# Patient Record
Sex: Female | Born: 1935
Health system: Southern US, Community
[De-identification: ages and names within clinical notes are randomized; demographics above are authoritative.]

## PROBLEM LIST (undated history)

## (undated) DIAGNOSIS — Z79899 Other long term (current) drug therapy: Secondary | ICD-10-CM

## (undated) DIAGNOSIS — E785 Hyperlipidemia, unspecified: Secondary | ICD-10-CM

## (undated) DIAGNOSIS — I779 Disorder of arteries and arterioles, unspecified: Secondary | ICD-10-CM

## (undated) DIAGNOSIS — J189 Pneumonia, unspecified organism: Secondary | ICD-10-CM

## (undated) DIAGNOSIS — I10 Essential (primary) hypertension: Secondary | ICD-10-CM

## (undated) DIAGNOSIS — R55 Syncope and collapse: Secondary | ICD-10-CM

## (undated) DIAGNOSIS — E669 Obesity, unspecified: Secondary | ICD-10-CM

## (undated) DIAGNOSIS — T8859XA Other complications of anesthesia, initial encounter: Secondary | ICD-10-CM

## (undated) DIAGNOSIS — I5189 Other ill-defined heart diseases: Secondary | ICD-10-CM

## (undated) DIAGNOSIS — Z8719 Personal history of other diseases of the digestive system: Secondary | ICD-10-CM

## (undated) DIAGNOSIS — I517 Cardiomegaly: Secondary | ICD-10-CM

## (undated) DIAGNOSIS — M1990 Unspecified osteoarthritis, unspecified site: Secondary | ICD-10-CM

## (undated) DIAGNOSIS — T4145XA Adverse effect of unspecified anesthetic, initial encounter: Secondary | ICD-10-CM

## (undated) DIAGNOSIS — N189 Chronic kidney disease, unspecified: Secondary | ICD-10-CM

## (undated) DIAGNOSIS — I480 Paroxysmal atrial fibrillation: Secondary | ICD-10-CM

## (undated) DIAGNOSIS — K219 Gastro-esophageal reflux disease without esophagitis: Secondary | ICD-10-CM

## (undated) DIAGNOSIS — IMO0001 Reserved for inherently not codable concepts without codable children: Secondary | ICD-10-CM

## (undated) DIAGNOSIS — I499 Cardiac arrhythmia, unspecified: Secondary | ICD-10-CM

## (undated) DIAGNOSIS — R51 Headache: Secondary | ICD-10-CM

## (undated) DIAGNOSIS — I2510 Atherosclerotic heart disease of native coronary artery without angina pectoris: Secondary | ICD-10-CM

## (undated) DIAGNOSIS — Z87898 Personal history of other specified conditions: Secondary | ICD-10-CM

## (undated) HISTORY — DX: Disorder of arteries and arterioles, unspecified: I77.9

## (undated) HISTORY — DX: Essential (primary) hypertension: I10

## (undated) HISTORY — DX: Other long term (current) drug therapy: Z79.899

## (undated) HISTORY — DX: Reserved for inherently not codable concepts without codable children: IMO0001

## (undated) HISTORY — PX: TUBAL LIGATION: SHX77

## (undated) HISTORY — DX: Personal history of other specified conditions: Z87.898

## (undated) HISTORY — DX: Obesity, unspecified: E66.9

## (undated) HISTORY — PX: TONSILLECTOMY: SUR1361

## (undated) HISTORY — DX: Other ill-defined heart diseases: I51.89

## (undated) HISTORY — DX: Atherosclerotic heart disease of native coronary artery without angina pectoris: I25.10

## (undated) HISTORY — DX: Hyperlipidemia, unspecified: E78.5

## (undated) HISTORY — DX: Cardiomegaly: I51.7

## (undated) HISTORY — PX: BACK SURGERY: SHX140

## (undated) HISTORY — PX: DILATION AND CURETTAGE OF UTERUS: SHX78

## (undated) HISTORY — DX: Syncope and collapse: R55

## (undated) HISTORY — DX: Paroxysmal atrial fibrillation: I48.0

## (undated) HISTORY — PX: COLONOSCOPY: SHX174

---
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
## 1998-01-24 ENCOUNTER — Encounter: Payer: Self-pay | Admitting: *Deleted

## 1998-01-25 ENCOUNTER — Observation Stay (HOSPITAL_COMMUNITY): Admission: EM | Admit: 1998-01-25 | Discharge: 1998-01-25 | Payer: Self-pay | Admitting: Emergency Medicine

## 1998-04-07 ENCOUNTER — Other Ambulatory Visit: Admission: RE | Admit: 1998-04-07 | Discharge: 1998-04-07 | Payer: Self-pay | Admitting: Gynecology

## 1999-05-31 ENCOUNTER — Encounter: Payer: Self-pay | Admitting: Gynecology

## 1999-05-31 ENCOUNTER — Encounter: Admission: RE | Admit: 1999-05-31 | Discharge: 1999-05-31 | Payer: Self-pay | Admitting: Gynecology

## 1999-06-27 ENCOUNTER — Other Ambulatory Visit: Admission: RE | Admit: 1999-06-27 | Discharge: 1999-06-27 | Payer: Self-pay | Admitting: Gynecology

## 1999-07-29 ENCOUNTER — Encounter: Admission: RE | Admit: 1999-07-29 | Discharge: 1999-07-29 | Payer: Self-pay | Admitting: Gynecology

## 1999-07-29 ENCOUNTER — Encounter: Payer: Self-pay | Admitting: Gynecology

## 1999-08-29 ENCOUNTER — Encounter (INDEPENDENT_AMBULATORY_CARE_PROVIDER_SITE_OTHER): Payer: Self-pay | Admitting: Specialist

## 1999-08-29 ENCOUNTER — Other Ambulatory Visit: Admission: RE | Admit: 1999-08-29 | Discharge: 1999-08-29 | Payer: Self-pay | Admitting: Gynecology

## 1999-10-19 ENCOUNTER — Ambulatory Visit (HOSPITAL_COMMUNITY): Admission: RE | Admit: 1999-10-19 | Discharge: 1999-10-19 | Payer: Self-pay | Admitting: *Deleted

## 1999-10-19 ENCOUNTER — Encounter: Payer: Self-pay | Admitting: *Deleted

## 2000-08-29 ENCOUNTER — Encounter: Admission: RE | Admit: 2000-08-29 | Discharge: 2000-08-29 | Payer: Self-pay | Admitting: Gynecology

## 2000-08-29 ENCOUNTER — Encounter: Payer: Self-pay | Admitting: Gynecology

## 2000-09-10 ENCOUNTER — Other Ambulatory Visit: Admission: RE | Admit: 2000-09-10 | Discharge: 2000-09-10 | Payer: Self-pay | Admitting: Gynecology

## 2001-09-30 ENCOUNTER — Other Ambulatory Visit: Admission: RE | Admit: 2001-09-30 | Discharge: 2001-09-30 | Payer: Self-pay | Admitting: Gynecology

## 2002-07-25 ENCOUNTER — Inpatient Hospital Stay (HOSPITAL_COMMUNITY): Admission: EM | Admit: 2002-07-25 | Discharge: 2002-07-29 | Payer: Self-pay | Admitting: *Deleted

## 2002-07-28 HISTORY — PX: CARDIAC CATHETERIZATION: SHX172

## 2002-08-15 ENCOUNTER — Encounter: Admission: RE | Admit: 2002-08-15 | Discharge: 2002-08-15 | Payer: Self-pay | Admitting: Internal Medicine

## 2002-08-15 ENCOUNTER — Encounter: Payer: Self-pay | Admitting: Internal Medicine

## 2002-10-21 ENCOUNTER — Other Ambulatory Visit: Admission: RE | Admit: 2002-10-21 | Discharge: 2002-10-21 | Payer: Self-pay | Admitting: Gynecology

## 2003-08-26 ENCOUNTER — Encounter: Admission: RE | Admit: 2003-08-26 | Discharge: 2003-08-26 | Payer: Self-pay | Admitting: Gynecology

## 2003-09-14 ENCOUNTER — Encounter: Admission: RE | Admit: 2003-09-14 | Discharge: 2003-09-14 | Payer: Self-pay | Admitting: Family Medicine

## 2003-09-17 ENCOUNTER — Encounter: Admission: RE | Admit: 2003-09-17 | Discharge: 2003-10-06 | Payer: Self-pay | Admitting: Chiropractic Medicine

## 2003-10-22 ENCOUNTER — Inpatient Hospital Stay (HOSPITAL_COMMUNITY): Admission: EM | Admit: 2003-10-22 | Discharge: 2003-10-23 | Payer: Self-pay

## 2003-12-18 ENCOUNTER — Ambulatory Visit: Payer: Self-pay | Admitting: Cardiology

## 2003-12-29 ENCOUNTER — Ambulatory Visit: Payer: Self-pay

## 2003-12-29 ENCOUNTER — Other Ambulatory Visit: Admission: RE | Admit: 2003-12-29 | Discharge: 2003-12-29 | Payer: Self-pay | Admitting: Gynecology

## 2004-01-14 ENCOUNTER — Ambulatory Visit: Payer: Self-pay | Admitting: Cardiology

## 2004-02-15 ENCOUNTER — Ambulatory Visit: Payer: Self-pay

## 2004-03-17 ENCOUNTER — Ambulatory Visit: Payer: Self-pay | Admitting: Cardiology

## 2004-04-14 ENCOUNTER — Ambulatory Visit: Payer: Self-pay | Admitting: *Deleted

## 2004-05-12 ENCOUNTER — Ambulatory Visit: Payer: Self-pay | Admitting: Internal Medicine

## 2004-06-02 ENCOUNTER — Ambulatory Visit: Payer: Self-pay | Admitting: Cardiology

## 2004-06-30 ENCOUNTER — Ambulatory Visit: Payer: Self-pay | Admitting: Cardiology

## 2004-07-28 ENCOUNTER — Ambulatory Visit: Payer: Self-pay | Admitting: Cardiology

## 2004-08-25 ENCOUNTER — Ambulatory Visit: Payer: Self-pay | Admitting: Cardiology

## 2004-09-29 ENCOUNTER — Ambulatory Visit: Payer: Self-pay | Admitting: Cardiology

## 2004-10-27 ENCOUNTER — Ambulatory Visit: Payer: Self-pay | Admitting: Cardiology

## 2004-11-17 ENCOUNTER — Ambulatory Visit: Payer: Self-pay | Admitting: Cardiology

## 2004-12-15 ENCOUNTER — Ambulatory Visit: Payer: Self-pay | Admitting: Cardiology

## 2005-01-12 ENCOUNTER — Ambulatory Visit: Payer: Self-pay | Admitting: Cardiology

## 2005-02-09 ENCOUNTER — Ambulatory Visit: Payer: Self-pay | Admitting: Internal Medicine

## 2005-03-09 ENCOUNTER — Ambulatory Visit: Payer: Self-pay | Admitting: Cardiology

## 2005-04-04 ENCOUNTER — Ambulatory Visit: Payer: Self-pay | Admitting: *Deleted

## 2005-04-04 ENCOUNTER — Ambulatory Visit: Payer: Self-pay | Admitting: Cardiology

## 2005-05-04 ENCOUNTER — Ambulatory Visit: Payer: Self-pay | Admitting: Cardiology

## 2005-06-01 ENCOUNTER — Ambulatory Visit: Payer: Self-pay | Admitting: Cardiology

## 2005-06-29 ENCOUNTER — Ambulatory Visit: Payer: Self-pay | Admitting: Cardiology

## 2005-07-27 ENCOUNTER — Ambulatory Visit: Payer: Self-pay | Admitting: Cardiology

## 2005-08-10 ENCOUNTER — Ambulatory Visit: Payer: Self-pay | Admitting: Cardiology

## 2005-08-11 ENCOUNTER — Ambulatory Visit: Payer: Self-pay

## 2005-08-31 ENCOUNTER — Ambulatory Visit: Payer: Self-pay | Admitting: Internal Medicine

## 2005-09-18 ENCOUNTER — Ambulatory Visit: Payer: Self-pay | Admitting: Internal Medicine

## 2005-09-21 ENCOUNTER — Ambulatory Visit: Payer: Self-pay | Admitting: Cardiology

## 2005-10-19 ENCOUNTER — Ambulatory Visit: Payer: Self-pay | Admitting: Cardiology

## 2005-11-16 ENCOUNTER — Ambulatory Visit: Payer: Self-pay | Admitting: Cardiology

## 2005-12-04 ENCOUNTER — Ambulatory Visit: Payer: Self-pay | Admitting: Cardiology

## 2006-01-04 ENCOUNTER — Ambulatory Visit: Payer: Self-pay | Admitting: Internal Medicine

## 2006-02-09 ENCOUNTER — Ambulatory Visit: Payer: Self-pay | Admitting: Cardiology

## 2006-02-14 ENCOUNTER — Encounter: Admission: RE | Admit: 2006-02-14 | Discharge: 2006-02-14 | Payer: Self-pay | Admitting: Gynecology

## 2006-02-22 ENCOUNTER — Other Ambulatory Visit: Admission: RE | Admit: 2006-02-22 | Discharge: 2006-02-22 | Payer: Self-pay | Admitting: Gynecology

## 2006-03-01 ENCOUNTER — Ambulatory Visit: Payer: Self-pay | Admitting: Cardiology

## 2006-03-15 ENCOUNTER — Ambulatory Visit: Payer: Self-pay | Admitting: Cardiology

## 2006-04-05 ENCOUNTER — Ambulatory Visit: Payer: Self-pay | Admitting: *Deleted

## 2006-05-03 ENCOUNTER — Ambulatory Visit: Payer: Self-pay | Admitting: Cardiology

## 2006-06-04 ENCOUNTER — Ambulatory Visit: Payer: Self-pay | Admitting: Cardiology

## 2006-06-25 ENCOUNTER — Ambulatory Visit: Payer: Self-pay | Admitting: Internal Medicine

## 2006-07-16 ENCOUNTER — Ambulatory Visit: Payer: Self-pay | Admitting: Internal Medicine

## 2006-08-07 ENCOUNTER — Ambulatory Visit: Payer: Self-pay | Admitting: Cardiology

## 2006-09-04 ENCOUNTER — Ambulatory Visit: Payer: Self-pay | Admitting: Cardiology

## 2006-09-25 ENCOUNTER — Ambulatory Visit: Payer: Self-pay | Admitting: Cardiology

## 2006-10-23 ENCOUNTER — Ambulatory Visit: Payer: Self-pay | Admitting: Cardiology

## 2006-11-05 ENCOUNTER — Ambulatory Visit: Payer: Self-pay | Admitting: Internal Medicine

## 2006-11-05 DIAGNOSIS — R079 Chest pain, unspecified: Secondary | ICD-10-CM | POA: Insufficient documentation

## 2006-11-05 DIAGNOSIS — I10 Essential (primary) hypertension: Secondary | ICD-10-CM | POA: Insufficient documentation

## 2006-11-05 DIAGNOSIS — I2510 Atherosclerotic heart disease of native coronary artery without angina pectoris: Secondary | ICD-10-CM | POA: Insufficient documentation

## 2006-11-20 ENCOUNTER — Ambulatory Visit: Payer: Self-pay | Admitting: Cardiology

## 2006-11-22 ENCOUNTER — Ambulatory Visit: Payer: Self-pay | Admitting: Internal Medicine

## 2006-11-26 LAB — CONVERTED CEMR LAB
Basophils Absolute: 0.1 10*3/uL (ref 0.0–0.1)
Basophils Relative: 1.4 % — ABNORMAL HIGH (ref 0.0–1.0)
Calcium: 10.1 mg/dL (ref 8.4–10.5)
Chloride: 107 meq/L (ref 96–112)
Cholesterol: 279 mg/dL (ref 0–200)
Eosinophils Relative: 3.1 % (ref 0.0–5.0)
GFR calc Af Amer: 70 mL/min
GFR calc non Af Amer: 58 mL/min
Lymphocytes Relative: 29.2 % (ref 12.0–46.0)
MCHC: 34.2 g/dL (ref 30.0–36.0)
MCV: 93.7 fL (ref 78.0–100.0)
Monocytes Absolute: 0.4 10*3/uL (ref 0.2–0.7)
Monocytes Relative: 7.4 % (ref 3.0–11.0)
Neutro Abs: 3.1 10*3/uL (ref 1.4–7.7)
Neutrophils Relative %: 58.9 % (ref 43.0–77.0)
Platelets: 256 10*3/uL (ref 150–400)
RBC: 4.54 M/uL (ref 3.87–5.11)
RDW: 12.3 % (ref 11.5–14.6)
Triglycerides: 113 mg/dL (ref 0–149)
WBC: 5.4 10*3/uL (ref 4.5–10.5)

## 2006-12-20 ENCOUNTER — Ambulatory Visit: Payer: Self-pay | Admitting: Cardiology

## 2007-01-10 ENCOUNTER — Ambulatory Visit: Payer: Self-pay | Admitting: Cardiovascular Disease

## 2007-02-04 ENCOUNTER — Ambulatory Visit: Payer: Self-pay | Admitting: Cardiology

## 2007-02-25 ENCOUNTER — Encounter: Admission: RE | Admit: 2007-02-25 | Discharge: 2007-02-25 | Payer: Self-pay | Admitting: Gynecology

## 2007-03-06 ENCOUNTER — Ambulatory Visit: Payer: Self-pay | Admitting: Cardiology

## 2007-04-03 ENCOUNTER — Encounter: Payer: Self-pay | Admitting: Internal Medicine

## 2007-04-03 ENCOUNTER — Ambulatory Visit: Payer: Self-pay | Admitting: Internal Medicine

## 2007-05-01 ENCOUNTER — Ambulatory Visit: Payer: Self-pay | Admitting: Cardiology

## 2007-05-29 ENCOUNTER — Ambulatory Visit: Payer: Self-pay | Admitting: Cardiology

## 2007-06-26 ENCOUNTER — Ambulatory Visit: Payer: Self-pay | Admitting: Cardiology

## 2007-07-03 ENCOUNTER — Ambulatory Visit: Payer: Self-pay | Admitting: Cardiology

## 2007-07-18 ENCOUNTER — Ambulatory Visit: Payer: Self-pay | Admitting: Cardiology

## 2007-07-29 ENCOUNTER — Ambulatory Visit: Payer: Self-pay | Admitting: Internal Medicine

## 2007-08-21 ENCOUNTER — Ambulatory Visit: Payer: Self-pay | Admitting: Cardiology

## 2007-09-02 ENCOUNTER — Ambulatory Visit: Payer: Self-pay | Admitting: Cardiovascular Disease

## 2007-09-26 ENCOUNTER — Ambulatory Visit: Payer: Self-pay | Admitting: Internal Medicine

## 2007-10-11 ENCOUNTER — Encounter: Payer: Self-pay | Admitting: Internal Medicine

## 2007-10-16 ENCOUNTER — Encounter: Payer: Self-pay | Admitting: Internal Medicine

## 2007-10-29 ENCOUNTER — Inpatient Hospital Stay (HOSPITAL_COMMUNITY): Admission: AD | Admit: 2007-10-29 | Discharge: 2007-10-31 | Payer: Self-pay | Admitting: Cardiology

## 2008-04-30 ENCOUNTER — Encounter: Payer: Self-pay | Admitting: Internal Medicine

## 2008-05-27 ENCOUNTER — Encounter
Admission: RE | Admit: 2008-05-27 | Discharge: 2008-07-09 | Payer: Self-pay | Admitting: Physical Medicine and Rehabilitation

## 2008-06-13 DIAGNOSIS — E785 Hyperlipidemia, unspecified: Secondary | ICD-10-CM | POA: Insufficient documentation

## 2008-06-13 DIAGNOSIS — I480 Paroxysmal atrial fibrillation: Secondary | ICD-10-CM | POA: Insufficient documentation

## 2008-06-19 ENCOUNTER — Ambulatory Visit (HOSPITAL_COMMUNITY): Admission: RE | Admit: 2008-06-19 | Discharge: 2008-06-19 | Payer: Self-pay | Admitting: Gastroenterology

## 2008-07-28 ENCOUNTER — Encounter: Admission: RE | Admit: 2008-07-28 | Discharge: 2008-07-28 | Payer: Self-pay | Admitting: Neurosurgery

## 2008-08-11 ENCOUNTER — Inpatient Hospital Stay (HOSPITAL_COMMUNITY): Admission: RE | Admit: 2008-08-11 | Discharge: 2008-08-12 | Payer: Self-pay | Admitting: Neurosurgery

## 2008-09-16 ENCOUNTER — Encounter: Payer: Self-pay | Admitting: Cardiology

## 2009-04-19 ENCOUNTER — Encounter: Admission: RE | Admit: 2009-04-19 | Discharge: 2009-04-19 | Payer: Self-pay | Admitting: Gynecology

## 2009-05-11 ENCOUNTER — Encounter: Admission: RE | Admit: 2009-05-11 | Discharge: 2009-05-11 | Payer: Self-pay | Admitting: Gynecology

## 2009-12-08 ENCOUNTER — Ambulatory Visit: Payer: Self-pay | Admitting: Cardiology

## 2009-12-13 ENCOUNTER — Encounter: Admission: RE | Admit: 2009-12-13 | Discharge: 2009-12-13 | Payer: Self-pay | Admitting: Cardiology

## 2010-01-07 ENCOUNTER — Ambulatory Visit: Payer: Self-pay | Admitting: Cardiology

## 2010-03-30 ENCOUNTER — Other Ambulatory Visit: Payer: Self-pay | Admitting: Gynecology

## 2010-03-30 DIAGNOSIS — Z1231 Encounter for screening mammogram for malignant neoplasm of breast: Secondary | ICD-10-CM

## 2010-04-21 ENCOUNTER — Ambulatory Visit
Admission: RE | Admit: 2010-04-21 | Discharge: 2010-04-21 | Disposition: A | Discharge status: Home / Self Care | Payer: BC Managed Care – PPO | Source: Ambulatory Visit | Attending: Gynecology | Admitting: Gynecology

## 2010-04-21 DIAGNOSIS — Z1231 Encounter for screening mammogram for malignant neoplasm of breast: Secondary | ICD-10-CM

## 2010-04-22 ENCOUNTER — Other Ambulatory Visit: Payer: Self-pay | Admitting: Gynecology

## 2010-04-22 DIAGNOSIS — R928 Other abnormal and inconclusive findings on diagnostic imaging of breast: Secondary | ICD-10-CM

## 2010-04-29 ENCOUNTER — Ambulatory Visit
Admission: RE | Admit: 2010-04-29 | Discharge: 2010-04-29 | Disposition: A | Discharge status: Home / Self Care | Payer: BC Managed Care – PPO | Source: Ambulatory Visit | Attending: Gynecology | Admitting: Gynecology

## 2010-04-29 DIAGNOSIS — R928 Other abnormal and inconclusive findings on diagnostic imaging of breast: Secondary | ICD-10-CM

## 2010-05-15 LAB — PROTIME-INR: Prothrombin Time: 12.8 seconds (ref 11.6–15.2)

## 2010-05-15 LAB — APTT: aPTT: 27 seconds (ref 24–37)

## 2010-05-16 LAB — CBC
MCHC: 33.1 g/dL (ref 30.0–36.0)
MCV: 99.6 fL (ref 78.0–100.0)
RBC: 4.21 MIL/uL (ref 3.87–5.11)
RDW: 13.2 % (ref 11.5–15.5)
WBC: 7.6 10*3/uL (ref 4.0–10.5)

## 2010-05-16 LAB — URINALYSIS, ROUTINE W REFLEX MICROSCOPIC
Hgb urine dipstick: NEGATIVE
Ketones, ur: NEGATIVE mg/dL
Nitrite: NEGATIVE
Specific Gravity, Urine: 1.014 (ref 1.005–1.030)

## 2010-05-16 LAB — BASIC METABOLIC PANEL
GFR calc non Af Amer: >60 mL/min (ref >60)
Glucose, Bld: 111 mg/dL — ABNORMAL HIGH (ref 70–99)
Sodium: 136 mEq/L (ref 135–145)

## 2010-05-16 LAB — URINE MICROSCOPIC-ADD ON

## 2010-05-16 LAB — PROTIME-INR: Prothrombin Time: 21.8 seconds — ABNORMAL HIGH (ref 11.6–15.2)

## 2010-05-16 LAB — APTT: aPTT: 31 seconds (ref 24–37)

## 2010-05-28 ENCOUNTER — Other Ambulatory Visit: Payer: Self-pay | Admitting: Cardiology

## 2010-05-28 DIAGNOSIS — I10 Essential (primary) hypertension: Secondary | ICD-10-CM

## 2010-05-31 ENCOUNTER — Other Ambulatory Visit: Payer: Self-pay | Admitting: *Deleted

## 2010-05-31 DIAGNOSIS — I480 Paroxysmal atrial fibrillation: Secondary | ICD-10-CM

## 2010-05-31 MED ORDER — FLECAINIDE ACETATE 50 MG PO TABS: 50.0000 mg | ORAL_TABLET | Freq: Two times a day (BID) | ORAL | Status: DC

## 2010-06-21 NOTE — Op Note (Signed)
NAMERICQUEL, FOULK                 ACCOUNT NO.:  [id number]   MEDICAL RECORD NO.:  [id number]          PATIENT TYPE:  OIB   LOCATION:  3523                         FACILITY:  [facility]   PHYSICIAN:  Clydene Fake, M.D.  DATE OF BIRTH:  Nov 17, 1934   DATE OF PROCEDURE:  08/11/2008  DATE OF DISCHARGE:                               OPERATIVE REPORT   PREOPERATIVE DIAGNOSES:  Lumbar stenosis spondylosis and herniated  nucleus pulposus L1 and L2.   POSTOPERATIVE DIAGNOSIS:  Lumbar stenosis spondylosis and herniated  nucleus pulposus L1 and L2.   PROCEDURE:  Right L1 and L2 decompressive of laminectomy and  decompressing of the L1 and L2 nerve roots (two levels), diskectomy, and  microdissection.   SURGEON:  Clydene Fake, MD   ASSISTANT:  Coletta Memos, MD   ANESTHESIA:  General endotracheal tube anesthesia.   ESTIMATED BLOOD LOSS:  Minimal.   BLOOD GIVEN:  None.   DRAINS:  None.   COMPLICATIONS:  None.   REASON FOR PROCEDURE:  The patient is a 76 year old woman who has been  having back and right leg pain, numbness, and weakness has been  progressive.  An MRI was done showing multiple spinal changes, these had  all compared well with previous MRI except for the L1-2 level, which had  the spinal change, but a severe stenosis in the top of that disk  herniation worse than 1 seen in previous MRI more than right side with  extruded fragment going up cephalad.  The patient was brought in for  decompression and diskectomy of L1-2 level.   PROCEDURE IN DETAIL:  The patient was brought to the operating room and  general anesthesia was induced.  The patient was placed in prone  position on Wilson frame with all pressure points padded.  The patient  was scrubbed and draped in sterile fashion.  Subcutaneous incision  injected with 10 mL of 1% lidocaine with epinephrine.  Needle was placed  in the interspace, x-ray was obtained that confirmed and the shoulder  pointing at the L1-2  interspace.  An incision was then made and centered  where the needle was.  Incision was taken down to the fascia.  Hemostasis was obtained with Bovie cauterization on the right side.  Subperiosteal dissection was done over the L1-2 spinous processes and  lamina out to the facet.  A self-retaining retractor was placed.  Marker  was placed in the interspaces.  Another x-rays was obtained confirming  our positioning at L1-2.  High-speed drill was then used to start  decompressive laminectomy removing a part of the arch of the lamina of  L1, inferior part of L2, medial facetectomy completed with Kerrison  punches.  A very thickened ligaments here posteriorly and this was  removed with curettes and the Kerrison punches as posterior ligament and  connected this to lateral ligament, which we impinged on the thecal sac  with a good decompression.  Hence thecal sac of the L1 and L2 nerve  roots with foraminotomies done over the L2 root and that exited well.  This was all done  under microscopic dissection.  Using the microscope,  we then explored the epidural space and found the disk space with a  bulky disk extruded fragment and this disk herniation was removed.  Though, we did not into the disk space, we found fragments of disk going  up behind the body of L1, now we are able to use a Veress nerve hooks  and we pulled though fragments down and removed that.  Then, hemostasis  with Gelfoam and thrombin, thoroughly irrigated out.  We then explored  variably good posterior decompression of the thecal sac in good  decompression of the L1 and L2 nerve roots after drilling the  decompressive laminectomy and diskectomy.  Then we irrigated with  antibiotic solution and retractors were removed.  Fascia closed with  Vicryl interrupted sutures.  Subcutaneous tissue closed with 2-0 and 3-0  Vicryl interrupted sutures.  Skin closed with benzoin and Steri-Strips.  Dressing was placed.  The patient was placed  back in the supine position  and awoken from anesthesia and transferred to recovery room in stable  condition.           ______________________________  Clydene Fake, M.D.     JRH/MEDQ  D:  08/11/2008  T:  08/11/2008  Job:  [id number]

## 2010-06-21 NOTE — Assessment & Plan Note (Signed)
[facility]                                 ON-CALL NOTE   JERALD, VILLALONA                          MRN:          [id number]  DATE:11/08/2006                            DOB:          23-Feb-1934    Dr. Willow Ora called regarding Royalty Fakhouri. I was doctor of the day on  October 2. He had seen her on September 29 and she had what he described  as what he thought was atypical pain. She had an EKG that was done. He  called today and asked [place] to compare with the old tracing. I am looking  at the tracing from August 07, 2006, her EKG is largely unchanged. There is  a biphasic T wave in leads 3 as compared to just T wave flattening on  the previous EKG. This does not represent a significant difference at  baseline. He had planned to call the patient to follow up with her. I  told her that we could arrange for her to be seen in the office if  needed. He is to call me back if that is necessary or make contact with  Dr. Daleen Squibb when he returns.     Arturo Morton. Riley Kill, MD, [facility]  Electronically Signed    TDS/MedQ  DD: 11/08/2006  DT: 11/08/2006  Job #: [id number]   cc:   Thomas C. Wall, MD, [facility]

## 2010-06-21 NOTE — Discharge Summary (Signed)
NAMEGRETTEL, Cheryl [name]                 ACCOUNT NO.:  [id number]   MEDICAL RECORD NO.:  [id number]          PATIENT TYPE:  INP   LOCATION:  2025                         FACILITY:  [facility]   PHYSICIAN:  Colleen Can. Deborah Chalk, M.D.DATE OF BIRTH:  06-06-1934   DATE OF ADMISSION:  10/29/2007  DATE OF DISCHARGE:  10/31/2007                               DISCHARGE SUMMARY   DISCHARGE DIAGNOSES:  1. Paroxysmal atrial fibrillation, now successfully initiated on      flecainide.  2. Chronic Coumadin anticoagulation.  3. Hypertension.  4. Obesity.  5. Hyperlipidemia.   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  She has had  progressive episodes that leave her very weak and washed out.  She has  been maintained on chronic Coumadin.  She has had a recent negative 2-D  echocardiogram as well as a negative stress Cardiolite study.   Please see the history and physical for further patient presentation and  profile.   LABORATORY DATA:  INR on admission was 3.4.  CBC was normal.  Chemistry  showed a sodium of 137, potassium 4.3, chloride 106, CO2 of 24, BUN 24,  creatinine 1.1, and a glucose of 96.   HOSPITAL COURSE:  The patient was admitted.  She was started on  flecainide 50 mg b.i.d., that dose was tolerated well without any known  problems.  Her rhythm remained stable on telemetry.  On October 31, 2007, she continues to do well and she is felt to be a stable candidate  for discharge.   DISCHARGE CONDITION:  Stable.   DISCHARGE DIET:  Low-salt, heart-healthy.   ACTIVITIES:  To be as tolerated.   DISCHARGE MEDICINES:  1. Flecainide 50 mg b.i.d.  2. Simvastatin 20 mg a day.  3. Trazodone 100 mg at night.  4. Vitamin D 2000 units daily.  5. Diltiazem 180 a day.  6. Baby aspirin daily.  7. Benazepril 20 mg a day.  8. Her Coumadin  dose will be cut back to 2.5 mg 4 days a week and 5 mg      3 days a week.   We will plan on seeing her back in the office in 1 week, certainly  sooner if any problems arise.      [name], N.P.      Colleen Can. Deborah Chalk, M.D.  Electronically Signed    LC/MEDQ  D:  10/31/2007  T:  10/31/2007  Job:  [id number]

## 2010-06-21 NOTE — Assessment & Plan Note (Signed)
[facility]                            CARDIOLOGY OFFICE NOTE   JOLYN, DESHMUKH                          MRN:          [id number]  DATE:08/21/2007                            DOB:          [birth year]    Ms. Lorenzi comes in today for management of her paroxysmal atrial  fibrillation and hypertension.  She has had a couple episodes since I  last saw her in the office, but they have only lasted a short period of  time.   She does not like taking all the medications including the diltiazem and  metoprolol, but has agreed to do so.  She is also on benazepril.   She checks her blood pressure at home.  She says it is up and down.   She is on,  1. Coumadin as directed.  2. Aspirin 81 mg a day.  3. Metoprolol succinate 50 mg a day.  4. Aspirin 81 mg a day.  5. Diltiazem extended release 180 q.a.m.   PHYSICAL EXAMINATION:  VITAL SIGNS:  Her blood pressure is 182/91; I  rechecked, it was 148/90 in the left arm.  Pulse is 55 and she is in  sinus brady, EKG confirms.  Her weight is 206, up 6.  GENERAL:  She is in no acute distress.  HEENT:  Unremarkable and unchanged since the last visit.  NECK:  Carotid upstrokes are equal bilaterally without bruits.  No JVD.  There is no thyromegaly.  LUNGS:  Clear.  HEART:  A nondisplaced but poorly appreciated PMI.  She has normal S1  and S2.  ABDOMEN:  Soft.  EXTREMITIES:  There is no cyanosis, clubbing, or edema.  Pulses are  intact.   Ms. Soyars continues to have brief episodes of infrequent atrial fib.  She is anticoagulated.  Her blood pressure is very labile, and I think  she would refuse to take any additional medications.  We actually talked  about this today.   We did talk about second-tier drugs for atrial fib and the possibility  of ablation.  She would like to avoid all of these if she can.  I will  see her back again in 6 months.      Thomas C. Daleen Squibb, MD, [facility]  Electronically Signed   TCW/MedQ   DD: 08/21/2007  DT: 08/22/2007  Job #: [id number]

## 2010-06-21 NOTE — H&P (Signed)
NAMEJUNETTE, BERNAT                 ACCOUNT NO.:  [id number]   MEDICAL RECORD NO.:  [id number]           PATIENT TYPE:   LOCATION:                                 FACILITY:   PHYSICIAN:  Colleen Can. Deborah Chalk, M.D.    DATE OF BIRTH:   DATE OF ADMISSION:  DATE OF DISCHARGE:                              HISTORY & PHYSICAL   CHIEF COMPLAINT:  Atrial fibrillation.   HISTORY OF PRESENT ILLNESS:  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  She presents for admission for drug loading with flecainide  because of paroxysmal atrial fibrillation.  She has had atrial  fibrillation since 1999 and has had more in the way of progressive  symptoms, it now happens 1-2 times a week.  Her longest episode lasted  for about 22 hours, most of it tends to occur at night.  She is left  very fatigued and has had some vague chest heaviness.  She has been  maintained on chronic Coumadin.  She has had recent negative 2-D  echocardiogram as well as negative stress Cardiolite study and now  presents for initiation of antiarrhythmic therapy.   PAST MEDICAL HISTORY:  1. Atrial fibrillation since 1999.  2. Chronic Coumadin therapy.  3. Hypertension.  4. Obesity.  5. Hyperlipidemia.  6. Appendectomy.  7. Back surgery.  8. Tonsillectomy.   ALLERGIES:  SULFA, LIPITOR, and CODEINE.   CURRENT MEDICATIONS:  1. Aspirin 81 mg a day.  2. Benazepril 20 mg a day.  3. Diltiazem 180 a day.  4. Vitamin D 2000 international units daily.  5. Trazodone 100 mg at bedtime.  6. Coumadin, currently taking 5 mg x4, 2.5 x3.   FAMILY HISTORY:  Father died at 1 of lung cancer.  Mother is alive at  age of 9.  She has no siblings.  She has 3 children.   REVIEW OF SYSTEMS:  She does have reflux and uses p.r.n. Zantac.  She  has never had any test on her gallbladder performed.  She does have some  tendency towards constipation.  She might have some shortness of breath  but that really does not seem to be very  bothersome for her.  She has  had a dry cough at times.  She sleeps well.  She does not snore.  She  has had no recent fever or flu symptoms.   PHYSICAL EXAMINATION:  GENERAL:  She is a pleasant, obese white female.  She is in no acute distress.  VITAL SIGNS:  Her weight is 204 pounds; blood pressure 140/84 sitting,  146/78 standing; and heart rate 64.  HEENT:  Unremarkable.  NECK:  Supple.  No thyromegaly.  LUNGS:  Clear.  HEART:  Regular rhythm.  ABDOMEN:  Soft and nontender.  EXTREMITIES:  Full but without significant edema.  NEUROLOGIC:  No gross focal deficits.   Her EKG showed leftward axis.   Chest x-ray showed borderline heart size.   OVERALL IMPRESSION:  1. Paroxysmal atrial fibrillation.  2. Chronic Coumadin anticoagulation.  3. Hypertension.  4. Hyperlipidemia.  5. Obesity.  6. Reflux.  PLAN:  We will proceed on with initiation of flecainide.  Her other home  medicines will be continued along with Coumadin anticoagulation.      [name], N.P.      Colleen Can. Deborah Chalk, M.D.  Electronically Signed    LC/MEDQ  D:  10/24/2007  T:  10/24/2007  Job:  [id number]   cc:   Willow Ora, MD

## 2010-06-21 NOTE — Assessment & Plan Note (Signed)
[facility]                            CARDIOLOGY OFFICE NOTE   TASHEA, OTHMAN                          MRN:          [id number]  DATE:07/18/2007                            DOB:          12-15-1934    Cheryl [name] comes in today because of recurrent atrial fib.  She had a  bout in January and one in February and now she has had three in May and  now one since last night around midnight.  She complains of some  fullness in her chest and some palpitations.  She had no pre-syncope,  syncope, orthopnea or shortness of breath.   She saw Herma Carson in my absence and Marcelino Duster started her on  metoprolol extended release 50 mg a day.  She takes Diltiazem extended  release 180 p.r.n.  She is on Coumadin.  She takes coated aspirin for  nonobstructive coronary disease demonstrated on cath in 2004.  Her last  stress Myoview showed no ischemia August 11, 2005.  Her EF was 76%.   She is compliant with her medications, but has not been in the past.   Her INR is 3.5 today.  Her blood pressure is 144/98, her pulse is 100  and irregular.  Listening to it it runs about 90 to 100.  Weight is 202.  (Note, that she took an extra Diltiazem last night and then one again  this afternoon around 2 o'clock.)   She is in no acute distress.  Respirations 18, unlabored.  Carotid  upstrokes were equal bilaterally without bruits, no JVD.  Thyroid is not  enlarged.  Trachea is midline.  LUNGS:  Clear.  There are no rales.  HEART:  Reveals an irregular rate and rhythm.  No gallop.  ABDOMEN: Soft, good bowel sounds.  EXTREMITIES: Reveal no cyanosis, clubbing or edema.  Pulses are intact.  NEUROLOGIC:  Exam is intact.  SKIN:  Unremarkable.   EKG confirms atrial fib.   I had a long talk with Mrs .Schrier in her husband.  I have made the  following recommendations:  1. No need for hospitalization since she is not that symptomatic and      her rate is well-controlled or pretty  well-controlled.  2. Begin taking Toprol XL/metoprolol extended release 50 mg q. a.m.      along with Diltiazem extended release 180 q. a.m. daily.  She will      start this in the morning.  3. If it does not break after a total of 48 hours, she will give [place] a      call.  At that time, I would admit her to hospital for      antiarrhythmic therapy.  With her coronary disease, though it is      nonobstructive, I would consider sotalol or amiodarone.   If she converts to sinus rhythm, will set her up for a 2-D  echocardiogram as an outpatient.  She may need another stress Myoview  before considering antiarrhythmic therapy electively.  She has an  appointment in July which we will have her keep.  Thomas C. Daleen Squibb, MD, [facility]  Electronically Signed    TCW/MedQ  DD: 07/18/2007  DT: 07/18/2007  Job #: [id number]

## 2010-06-21 NOTE — H&P (Signed)
NAMEELFREIDA, HEGGS                 ACCOUNT NO.:  [id number]   MEDICAL RECORD NO.:  [id number]          PATIENT TYPE:  INP   LOCATION:  2025                         FACILITY:  [facility]   PHYSICIAN:  Colleen Can. Deborah Chalk, M.D.DATE OF BIRTH:  01-01-1935   DATE OF ADMISSION:  10/29/2007  DATE OF DISCHARGE:                              HISTORY & PHYSICAL   REDICTATION   CHIEF COMPLAINT:  Symptomatic atrial fibrillation.   HISTORY OF PRESENT ILLNESS:  Ms. Smouse is a pleasant 76 year old white  female who is referred for antiarrhythmic therapy.  She has had a  history of paroxysmal atrial fibrillation, basically dating back since  1999.  She has had progressive episodes and now has 1-2 spells a week.  They tend to occur at night.  She has had no frank syncope.  She has  been maintained on chronic Coumadin for the last 2-3 years.  She does  note that her atrial fibrillation makes her feel very weak and washed  out.  She was subsequently referred and had stress testing performed,  which was unremarkable as well as a 2-D echocardiogram that showed an  ejection fraction at 55-60%.  She did have mild LVH.  She now presents  for antiarrhythmic loading with flecainide.   PAST MEDICAL HISTORY:  1. Atrial fibrillation.  2. Chronic Coumadin therapy.  3. Hypertension.  4. Obesity.  5. Hyperlipidemia.  6. Appendectomy.  7. Back surgery.  8. Tonsillectomy.   ALLERGIES:  1. SULFA.  2. LIPITOR.  3. CODEINE.   CURRENT MEDICATIONS:  1. Baby aspirin daily.  2. Benazepril 20 mg a day.  3. Diltiazem 180 a day.  4. Vitamin D 2000 units daily.  5. Trazodone 100 mg at bedtime.  6. Coumadin.  Currently taking 5 mg four days a week 2.5 mg x3.   FAMILY HISTORY:  Father died at 65 with lung cancer.  Mother is alive at  age 15.  She has no siblings and has 3 children.   SOCIAL HISTORY:  She is married.  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  She is married and lives at home with her husband.   REVIEW OF SYSTEMS:  She does have some acid reflux and uses Zantac on a  p.r.n. basis.  She has never had any test on her gallbladder.  She does  have some tendencies towards constipation.  She has noted some shortness  of breath that really is not particularly bothered.  She has had a dry  cough at times.  She does sleep well and does not snore.  She does have  occasional nocturia.  She has had no recent fever, flu, or cough.  She  tries to walk and exercise with her husband.   PHYSICAL EXAMINATION:  VITAL SIGNS:  Her weight is 206, blood pressure  140/84, heart rate 64 and regular, and respirations 18.  She is  afebrile.  SKIN:  Warm and dry.  Color is unremarkable.  LUNGS:  Clear.  HEART:  Regular rhythm.  ABDOMEN:  Obese, soft, and positive bowel sounds.  Nontender.  EXTREMITIES:  Without  edema.  NEUROLOGIC:  No gross focal deficits.   PERTINENT LABORATORY DATA:  Pending.   OVERALL IMPRESSION:  1. Paroxysmal atrial fibrillation.  2. Hypertension.  3. Chronic Coumadin.   PLAN:  We will proceed on with initiation of flecainide.      [name], N.P.      Colleen Can. Deborah Chalk, M.D.  Electronically Signed    LC/MEDQ  D:  10/30/2007  T:  10/31/2007  Job:  [id number]

## 2010-06-21 NOTE — Assessment & Plan Note (Signed)
[facility]                            CARDIOLOGY OFFICE NOTE   KENT, BRAUNSCHWEIG                          MRN:          [id number]  DATE:07/03/2007                            DOB:          Jan 21, 1935    PRIMARY CARDIOLOGIST:  Maisie Fus C. Daleen Squibb, MD, [facility]   Ms. Kittel is a pleasant 76 year old married white female patient with a  history of paroxysmal atrial fibrillation and is on Coumadin.  She has  nonobstructive coronary artery disease with a negative stress Myoview in  2007 with ejection fraction of 76%.  She also has hypertension.  She has  had trouble in the past because of intolerance to multiple medications.   The patient comes in today stating she has had increased frequency of  going in and out of atrial fibrillation.  It has happened at least once  a week over the past three weeks and last 2-5 hours.  She says it  becomes fast and irregular.  It usually happens in the early morning  when she turns over in bed.  [place] and this does not  aggravate it, but she has not been doing this for the past month since  her husband had knee surgery.  She drinks two cups of coffee in the  morning, otherwise does not drink any caffeinated products.  She said  she takes diltiazem when this happens and it does help, but she feels  like it drops her blood pressure too much and she becomes a little bit  dizzy.  She does not like to take the diltiazem on a daily basis because  she says it makes her unable to sleep at night.   CURRENT MEDICATIONS:  1. Benazepril 20 mg daily.  2. Coated aspirin 81 mg daily.  3. Coumadin as directed.  4. Trazodone 100 mg daily.  5. Vitamin D and calcium daily.  6. Diltiazem 180 mg p.r.n.   PHYSICAL EXAMINATION:  A pleasant 76 year old white female in no acute  distress.  Blood pressure is 149/85, pulse 73, and weight 200.  NECK:  Without JVD, HJR, bruit, or thyroid enlargement.  LUNGS:  Clear anterior,  posterior, and lateral.  HEART:  Regular rate and rhythm at 73 beats per minute.  Normal S1 and  S2.  A 1/6 systolic ejection murmur at the left sternal border and apex.  ABDOMEN:  Soft without organomegaly, masses, lesions, or abnormal  tenderness.  EXTREMITIES:  Without cyanosis, clubbing, or edema.  She has good distal  pulses.   EKG, normal sinus rhythm.  No acute change.   IMPRESSION:  1. Paroxysmal atrial fibrillation with increased frequency.  2. Coumadin therapy.  3. Multiple medication intolerances.  4. Nonobstructive coronary disease with a negative stress Myoview in      July 2007, ejection fraction 76%.  5. Hypertension   PLAN:  At this time, I have given the patient a prescription for Toprol-  XL 50 mg daily to see if she tolerates this better.  If not, I told her  she can continue to take the  diltiazem preferably on a daily basis; but  if she can't tolerate then on a p.r.n. basis.  If it does drop her blood  pressure too much, we can always back off on benazepril to 10 mg daily.  She seems agreeable to this, and she will see Dr. Daleen Squibb back in one month  for her yearly followup.       Jacolyn Reedy, PA-C  Electronically Signed      Jonelle Sidle, MD  Electronically Signed   ML/MedQ  DD: 07/03/2007  DT: 07/03/2007  Job #: [id number]   cc:   Jesse Sans. Wall, MD, [facility]

## 2010-06-21 NOTE — Assessment & Plan Note (Signed)
[facility] HEALTHCARE                            CARDIOLOGY OFFICE NOTE   Cheryl [name], Cheryl [name]                          MRN:          [id number]  DATE:08/07/2006                            DOB:          [birth year]    [name] come in today for further management of the following issues:  1. Nonobstructive coronary disease.  She is having no angina.  Stress      Myoview August 11, 2005 EF 76% with no ischemia.  2. Paroxysmal atrial fibrillation.  She is in sinus rhythm.  She is      having some palpitations but infrequent.  3. Hypertension.   Her blood pressure has been up almost every time she has been in the  office.  She is not taking her Diltiazem 180 daily.  She says it does  not let her sleep.  She is very noncompliant with medicines in general  just because they make her feel bad.  She is on Coumadin which is being  followed here in the [facility].  She also takes trazodone 100 mg  at night.  She takes vitamin D and calcium.   EXAMINATION:  Today he blood pressure is 187/89, pulse 66 and regular.  Her EKG shows sinus rhythm with PACs.  She has a left anterior  fascicular block.  There are no new changes.  Weight is 201.  HEENT:  Unchanged and unremarkable.  Carotid upstroke were equal  bilaterally without bruit, no JVD, thyroid is not enlarged, trachea is  midline.  LUNGS:  Clear.  HEART:  Reveals a poorly appreciated PMI.  She has normal S1, S2.  ABDOMINAL:  Soft, good bowel sounds.  EXTREMITIES:  Reveal no edema, pulses are intact.  NEURO:  Exam is intact.   I have had a long talk, greater than 20 minutes with [name] about  the importance of controlling her blood pressure and being compliant.  After a long discussion, we placed her on amlodipine 5/benazepril 20.  I  COULD NOT USE A DIURETIC BECAUSE OF HER SULFA ALLERGY.   I have scheduled a followup in 4 weeks to see how her blood pressure is  doing and if she is compliant.  In addition she  will check some readings  for [place] as well.     Thomas C. Daleen Squibb, MD, [facility]  Electronically Signed    TCW/MedQ  DD: 08/07/2006  DT: 08/08/2006  Job #: [id number]   cc:   Willow Ora, MD

## 2010-06-21 NOTE — Assessment & Plan Note (Signed)
[facility] HEALTHCARE                            CARDIOLOGY OFFICE NOTE   JACULIN, Cheryl                          MRN:          [id number]  DATE:09/04/2006                            DOB:          [birth year]    Cheryl [name] comes in today for further management of the following  issues:  1. Nonobstructive coronary disease:  She is having no angina.  Stress      Myoview on August 11, 2005, EF 76% with no ischemia.  2. Paroxysmal atrial fibrillation:  She stays in sinus rhythm.  3. Hypertension:  This is fairly labile, and she is not intolerant of      medications.   See my note from August 07, 2006.  We tried amlodipine/Benazepril.  She  says this does not allow her to sleep.  She is very tired all of the  time, and she is dizzy when she gets up and gets around.  She is getting  ready to go to the beach with her family in two weeks, and she just does  not want to feel bad.   Unfortunately, she is allergic to SULFA, so diuretics are probably out.  There is no way she will tolerate beta blockers, I told her today.  She  has not tolerated Diltiazem in the past, even in a low dose of 180 mg a  day.  We are running out of options.   Her blood pressures that she has recorded at home have been on the  average about 130/80.  She has some systolics in the 150s, some  diastolics in the 90s, but mostly pretty good.  When her blood pressure  was 108 the other night, she said she was just extremely tired and  dizzy.  She says she feels best when it is about 140/80.   PHYSICAL EXAMINATION:  Her pressure today is 139/79.  Her pulse is 67  and regular.  Her weight is 200.  HEENT:  Unchanged.  NECK:  Carotids are equal bilaterally without bruits.  No JVD.  LUNGS:  Clear.  HEART:  A nondisplaced PMI.  She has normal S1 and S2.  ABDOMEN:  Soft.  EXTREMITIES:  Extremities reveal 1+ pitting edema.  Pulses are intact.   I have had a very up-front, heart-to-heart talk with Ms.  [name] today.  Ever since I have known her, she has been extremely unwilling to be  tolerant of any medications, including statins.  We are running out of  options with antihypertensives.   After a long discussion, we decided on the following plan:  1. Change amlodipine and Benazepril just to Benazepril for 20 mg a      day.  2. Settle for a blood pressure around 140/80.  3. Continue to check pressures.  4. If her blood pressure medicines are making her miserable on her      vacation, I have told her      to stop it, and we will start all over when she comes back.  5. We will follow up with her in four weeks.  Thomas C. Daleen Squibb, MD, [facility]  Electronically Signed    TCW/MedQ  DD: 09/04/2006  DT: 09/04/2006  Job #: [id number]

## 2010-06-24 NOTE — H&P (Signed)
NAME:  Cheryl [name], Cheryl [name]                           ACCOUNT NO.:  [id number]   MEDICAL RECORD NO.:  [id number]                   PATIENT TYPE:  EMS   LOCATION:  [facility]                                 FACILITY:  [facility]   PHYSICIAN:  Learta Codding, M.D.                 DATE OF BIRTH:  [birth year]   DATE OF ADMISSION:  07/25/2002  DATE OF DISCHARGE:                                HISTORY & PHYSICAL   CHIEF COMPLAINT:  Substernal chest pain.   HISTORY OF PRESENT ILLNESS:  The patient is a 76 year old white female with  no prior cardiac history.  The patient does have a history of atrial  fibrillation several years ago, but currently on no medical therapy.  The  patient presents with new onset of substernal chest pain which started  initially last evening.  She reports a substernal chest heaviness initially  a 6/10, but then gradually improving over the course of the night.  However,  early this morning the patient again woke up with substernal chest pressure  with some radiation to the left shoulder and left arm.  The patient took  aspirin.  She then presented to [facility] where she was given nitroglycerin  and aspirin with some improvement in her symptoms.  Presently, the patient  has approximately 1/10 substernal chest pain and feels much improved.  EKG  does not demonstrate an acute injury pattern.  Initial blood pressure at  [facility] was markedly elevated at 167/108 mmHg.  It did remain elevated in  the emergency room at present.  She denies any shortness of breath,  diaphoresis, palpitations or syncope.   PAST MEDICAL HISTORY:  1. Atrial fibrillation, currently not on medical therapy.  2. Back surgery in 1980.  3. Status post childhood appendectomy.   SOCIAL HISTORY:  The patient is married and she has three children.  She is  a Psychologist, forensic.  She does not smoke or drink alcohol.   FAMILY HISTORY:  Notable for a mother alive at 35 with degenerative joint  disease.  Father died  at 71 of cancer.   REVIEW OF SYMPTOMS:  GENERAL:  Negative for fevers or chills. HEENT:  Negative for headaches or sore throat.  CARDIOVASCULAR: Positive for chest  pain and shortness of breath.  No dyspnea on exertion.  No orthopnea or PND.  GI:  No nausea or vomiting.   PHYSICAL EXAMINATION:  VITAL SIGNS:  Blood pressure 157/67, heart rate 63,  respirations 23, temperature 98.0.  GENERAL:  Well nourished white female who is overweight and is no apparent  distress.  HEENT:  Normocephalic, atraumatic.  Pupils equal, round and reactive to  light and accommodation.  Extraocular movements are intact.  Sclerae are  clear.  Neck is supple.  No bruits. Normal carotid upstroke.  HEART:  Regular rate and rhythm, normal S1 and S2.  No murmurs.  LUNGS:  Clear.  SKIN:  No rashes or lesions.  ABDOMEN:  Soft and nontender.  GU AND RECTAL EXAMS:  Deferred.  EXTREMITIES: No cyanosis, clubbing or edema.  NEUROLOGIC:  The patient is alert and oriented and grossly nonfocal.   Chest x-ray is pending.   EKG:  Normal sinus rhythm with no acute ischemic changes.   LABORATORY DATA:  Hemoglobin 16.4, white count 5.9, platelet count 320,000.  Glucose 92, sodium 139, potassium 4.1, BUN 20, creatinine pending.  Troponin  is less than 0.01.   IMPRESSION AND PLAN:  1. Substernal chest pain.  The patient has few risk factors for coronary     artery disease short of hypertension.  However, her chest pain is     somewhat concerning for an acute coronary syndrome. Her initial EKG is     negative as well as her Troponin and she has overall low risk markers.     The patient will be admitted for rule out myocardial infarction protocol     as per our policy. The patient has agreed to proceed with cardiac     catheterization.  If her enzymes are negative it would be reasonable to     pursue an exercise Cardiolite study in the morning.  If the patient has     ongoing chest pain unresponsive to medical therapy  catheterization should     still be considered.  2. Hypertension.  We will start the patient on Lopressor and ACE inhibitor     therapy.  3. History of atrial fibrillation, currently in normal sinus rhythm.  The     patient is age 62 and if she has recurrent atrial fibrillation     consideration should be given anticoagulation in the future.   DISPOSITION:  Rule out myocardial infarction; if positive enzymes proceed  with cardiac catheterization.  We will also obtain D-dimer to rule out  pulmonary embolism although this is an unlikely diagnosis. Aortic dissection  is also an unlikely diagnosis.                                               Learta Codding, M.D.    GED/MEDQ  D:  07/25/2002  T:  07/25/2002  Job:  [id number]

## 2010-06-24 NOTE — H&P (Signed)
NAME:  Cheryl [name], Cheryl [name]                           ACCOUNT NO.:  [id number]   MEDICAL RECORD NO.:  [id number]                   PATIENT TYPE:  INP   LOCATION:  1823                                 FACILITY:  [facility]   PHYSICIAN:  Vida Roller, M.D.                DATE OF BIRTH:  19-Aug-1934   DATE OF ADMISSION:  10/22/2003  DATE OF DISCHARGE:                                HISTORY & PHYSICAL   HISTORY OF PRESENT ILLNESS:  Mrs. Pippen is a 76 year old woman with a  history of hypertension, hyperlipidemia, and also a history of previous  atrial fibrillation diagnosed back in December of 1999, who presents with  the sudden onset of palpitations at about 6:30 this morning, associated with  heart rate in the 130 range.  She tried to go to work, but she got short of  breath, got some chest tightness.  She presented to the emergency  department, was evaluated, and was found to be in atrial fibrillation with a  rapid ventricular response of about 130.  Blood pressure at that time was  150/109.  She was given some IV nitroglycerin, which resolved the discomfort  in her chest, slowed her heart rate down and when I saw her, she was in  sinus rhythm, and currently feels well.  She has no complaints.   PAST MEDICAL HISTORY:  1.  Hypertension.  2.  Hyperlipidemia.  3.  Single episode of atrial fibrillation.  4.  She had an adenosine Cardiolite back in June of 2004, which was reported      as mildly abnormal.  The details are not clear.  She then underwent a      heart catheterization.  This was performed on June 21, __________, which      showed 30% to 40% left anterior descending coronary artery stenosis, 40%      to 50% right dominant coronary stenosis, with an ejection fraction of      50%.  She was treated with medical therapy and has done well until this      episode of atrial fibrillation.   SOCIAL HISTORY:  She is married.  She has 3 children who are alive and well.  She lives in [place]  with her husband.  She is a Psychologist, forensic.  She does not smoke, drink, or use illicit drugs.   ALLERGIES:  1.  SULFA.  2.  LIPITOR.  3.  CODEINE.   MEDICATIONS:  1.  Estrogen.  2.  Progesterone every week.  (She does not take any medication for her blood pressure.)   FAMILY HISTORY:  Her mother is alive at age 74 with her only significant  problem being DJD.  Her father died of lung cancer at age 75.  She has no  siblings.   REVIEW OF SYSTEMS:  Generally negative, except for that reviewed in the  history of present illness.  PHYSICAL EXAMINATION:  GENERAL:  She is a well-developed, well-nourished,  white female in no apparent distress, very pleasant.  VITAL SIGNS:  Her temperature is 97.4, heart rate 70, blood pressure 130/80,  and her respirations are 18.  She is saturating 96% on room air.  HEENT:  Unremarkable.  NECK:  Supple.  There is no jugular venous distention or carotid bruits.  CHEST:  Clear to auscultation bilaterally.  CARDIAC:  Regular with no significant murmurs.  ABDOMEN:  Soft, nontender, with normoactive bowel sounds.  GU/RECTAL:  Deferred.  EXTREMITIES:  Without significant clubbing, cyanosis, or edema.  Her pulses  are all 2+ throughout.  NEUROLOGIC:  Nonfocal.   Chest x-ray shows no active disease.  Electrocardiogram shows atrial  fibrillation at a rate of 130 with normal intervals, normal axis, no  ischemic ST-T wave changes.   LABORATORY DATA:  White blood cell count 7.4, hemoglobin and hematocrit of  14 and 43, with a platelet count of 280,000.  Sodium of 138, potassium 4.0,  chloride 108, BUN 20.  Blood sugar is 107.  Two sets of cardiac enzymes  point of care are not consistent with acute myocardial infarction.   This is a woman who has atrial fibrillation complicated by chest discomfort.  She is recently status post an unobstructed cath a little over a year ago.  Her atrial fibrillation has spontaneously converted to normal sinus rhythm.   I think it is reasonable to admit her to the hospital to rule her out for  myocardial infarction by enzymes.  If she stays in sinus rhythm, I think it  is reasonable work her coronary artery disease up further as an outpatient,  with an echocardiogram and likely a Cardiolite stress test.  I think  although her atrial fibrillation is relatively short lived, this is her  second episode of it.  So, therefore, she will need anticoagulation with  Lovenox, and then convert that to Coumadin.  It is very likely that this  woman who is very capable might be able to take outpatient Lovenox  injections, and may be able to be discharged tomorrow.  That will have to be  determined by the nursing staff on the ward and the attending physician who  manages her care tomorrow.                                                Vida Roller, M.D.    JH/MEDQ  D:  10/22/2003  T:  10/22/2003  Job:  [id number]   cc:   Benito Mccreedy, M.D.   Learta Codding, M.D. [facility]

## 2010-06-24 NOTE — Cardiovascular Report (Signed)
NAME:  Cheryl [name], Cheryl [name]                           ACCOUNT NO.:  [id number]   MEDICAL RECORD NO.:  [id number]                   PATIENT TYPE:  INP   LOCATION:  3714                                 FACILITY:  [facility]   PHYSICIAN:  Veneda Melter, M.D.                   DATE OF BIRTH:  [birth year]   DATE OF PROCEDURE:  07/28/2002  DATE OF DISCHARGE:                              CARDIAC CATHETERIZATION   PROCEDURES PERFORMED:  1. Left heart catheterization.  2. Left ventriculogram.  3. Selective coronary angiography.   DIAGNOSES:  1. Mild coronary artery disease by angiogram.  2. Normal left ventricular systolic function.   HISTORY:  The patient is a 76 year old white female with hypertension,  dyslipidemia who presents with substernal chest discomfort.  The patient was  admitted to the hospital, stabilized medically, and underwent stress imaging  study suggesting ischemia in the anterior wall.  She presents for further  cardiac assessment.   TECHNIQUE:  Informed consent was obtained.  The patient was brought to the  catheterization laboratory.  A 6-French sheath placed in the right femoral  artery using modified Seldinger technique.  A 6-French JL4 and JR4 catheter  was then used to engage the left and right coronary arteries and selective  angiography performed in various projections using manual injections of  contrast.  Subsequently, a 6-French pigtail catheter was then advanced left  ventricle and left ventriculogram performed using power injections of  contrast.  At the termination of this case catheters and sheath were removed  and manual pressure applied until adequate hemostasis achieved.  The patient  tolerated procedure well and was transferred to floor in stable condition.   FINDINGS:  1. Left main trunk:  Medium caliber vessel with moderate irregularities.  2. LAD is a medium caliber vessel that provides two diagonal branches.  The     proximal segment of the LAD has  proximal eccentric bend and plaque of 30-     40%.  The distal vessel has moderate irregularities and converts to mild     disease of 30%.  3. Left circumflex artery is a medium caliber vessel which bifurcates into a     marginal branch in the mid section.  The left circumflex system has     moderate irregularities of 20-30%.  4. Right coronary artery is dominant.  This is a medium caliber vessel that     provides a posterior descending artery and a posterior ventricular branch     in the terminal segment.  The right coronary has an anterior takeoff.     There is a focal plaque and bend in the vessel approximately 40-50% in     the proximal segment.  The remainder of the vessel has mild     irregularities.  5. LV:  Normal end-systolic and end-diastolic dimensions.  Overall left     ventricular function is well  preserved.  Ejection fraction greater than     55%.  No mitral regurgitation.  LV pressure is 170/10.  Aortic is 170/80.     LVEDP equals 15.   ASSESSMENT/PLAN:  The patient is a 76 year old female with mild coronary  artery disease with well preserved left ventricular function.  The patient  does not have a critical lesion in the left anterior descending seen as a  cause for  the abnormal Cardiolite and this could be considered as a false positive.  I  do not see a culprit lesion as the cause of her pain.  Continued medical  therapy and risk factor modification will be pursued.  She will be placed on  a pro time pump inhibitor and referral made to primary care.                                               Veneda Melter, M.D.    Melton Alar  D:  07/28/2002  T:  07/28/2002  Job:  [id number]  Learta Codding, M.D.  [address]  [address]  [place]  [place] [address]   cc:   Learta Codding, M.D.  [address]  [address]  [place]  [place] [address]

## 2010-06-24 NOTE — Discharge Summary (Signed)
NAMEBENAY, [name]                 ACCOUNT NO.:  [id number]   MEDICAL RECORD NO.:  [id number]          PATIENT TYPE:  INP   LOCATION:  2014                         FACILITY:  [facility]   PHYSICIAN:  Learta Codding, M.D. LHCDATE OF BIRTH:  1934/07/10   DATE OF ADMISSION:  10/22/2003  DATE OF DISCHARGE:  10/23/2003                                 DISCHARGE SUMMARY   DISCHARGE DIAGNOSES:  1.  Admitted with atrial fibrillation, rapid ventricular response with      concurrent dyspnea and chest tightness.  2.  Converted readily to sinus rhythm on IV Cardizem.  Will go home on IV      Cardizem, Lovenox bridging doses and starting Coumadin September 15.   SECONDARY DIAGNOSES:  1.  Hypertension.  2.  Hyperlipidemia.  3.  Prior history of atrial fibrillation, resolved years ago.  4.  History of adenosine Cardiolite study, June, 2004, mildly abnormal.  5.  Status post left-heart catheterization July 28, 2002, which showed 30-      40% LAD stenosis, 40-50% right coronary artery stenosis, ejection      fraction 50%.   PROCEDURES:  None this hospitalization.   DISCHARGE DISPOSITION:  Ms. Cheryl [name] is ready for discharge hospital day  #2.  Her electrocardiogram showed atrial fibrillation with rates in the  130's, but no ST depressions, and no Q waves noted.  She had cardiac enzyme  studies as follows:  0.20, 0.27, 0.18.  Serum electrolytes were all within  normal limits.  At the time of discharge, she was achieving 93% oxygen  saturation.  She was maintaining sinus rhythm for the last 16 hours.   DISCHARGE MEDICATIONS:  She goes home with the following medications:  1.  Cardizem SR 90 mg daily.  2.  Enteric-coated aspirin 325 mg.  3.  Trazodone 100 mg daily at bedtime.  4.  Lovenox 80 mg subcutaneous injection twice daily.  5.  Coumadin 6 mg daily, or as directed by the Coumadin clinic.   FOLLOWUP APPOINTMENTS:  1.  Coumadin clinic Monday, September 19, at 9:30 in the morning.  [facility], [address].  2.  Echocardiogram and adenosine stress test, Friday, September 23, at 12:55      p.m.  3.  She will see Dr. Andee Lineman September 26 at 3 o'clock in the afternoon.   BRIEF HISTORY:  Ms. Cheryl [name] is a 76 year old female.  She has a history of  hypertension, hyperlipidemia and previous atrial fibrillation.  She has also  had workup as detailed above consisting of an adenosine Cardiolite in June  of 2004 with subsequent left-heart catheterization.  The study showed  nonobstructive coronary artery disease.  She presented to the emergency room  on September 15 and was evaluated.  She was found to be in atrial  fibrillation with rapid ventricular response.  She was given some IV  nitroglycerin and this resolved the discomfort in her chest and slowed her  heart rate down.  At the time of this examination, she was in sinus rhythm.  The patient will be admitted, she will  be maintained on IV Cardizem which  will be subsequently changed to oral Cardizem.  She will be ruled out for  myocardial infarction with serial cardiac enzymes, and she will have  followup study at the office, a 2-D echocardiogram and adenosine Cardiolite  study.   HOSPITAL COURSE:  After presentation to the [facility] emergency  room with atrial fibrillation, rapid ventricular rate, chest discomfort, and  shortness of breath, the patient readily converted to sinus rhythm and has  maintained sinus rhythm ever since.   DISCHARGE MEDICATIONS:  She goes home on Cardizem, Lovenox and Coumadin as  mentioned above.   FOLLOWUP:  She will see Dr. Learta Codding in followup to discuss these  tests.      Maple Mirza, P.A   GM/MEDQ  D:  10/23/2003  T:  10/25/2003  Job:  [id number]

## 2010-06-24 NOTE — Assessment & Plan Note (Signed)
[facility] HEALTHCARE                                 ON-CALL NOTE   ANTONIETTA, LANSDOWNE                          MRN:          [id number]  DATE:11/24/2006                            DOB:          Apr 30, 1934    PRIMARY CARE PHYSICIAN:  Willow Ora, M.D.   Phone number is [phone].   The patient says she felt well until about 10 a.m. this morning, when  she was sitting turned her head and became suddenly dizzy.  She has now  been lying down for a couple hours and she feels fine when she is lying  still, however, when she tries to move she feels dizzy.  She has no  neurologic symptoms.  She has never had vertigo in the past.   I advised her it is most likely vertigo and with bed rest it should  resolve within a day or two.  If, however, the vertigo does not resolve  or the symptoms become more severe, then she is to go to the emergency  room or see her doctor next week for evaluation.     Jeffrey A. Tawanna Cooler, MD  Electronically Signed    JAT/MedQ  DD: 11/24/2006  DT: 11/25/2006  Job #: [id number]

## 2010-06-24 NOTE — Discharge Summary (Signed)
NAME:  Cheryl [name], Cheryl [name]                           ACCOUNT NO.:  [id number]   MEDICAL RECORD NO.:  [id number]                   PATIENT TYPE:  INP   LOCATION:  3714                                 FACILITY:  [facility]   PHYSICIAN:  Learta Codding, M.D.                 DATE OF BIRTH:  12-12-1934   DATE OF ADMISSION:  07/25/2002  DATE OF DISCHARGE:  07/29/2002                                 DISCHARGE SUMMARY   PRIMARY DIAGNOSIS:  Nonobstructive coronary disease.   SECONDARY DIAGNOSES:  Hypertension and dyslipidemia.   HISTORY OF PRESENT ILLNESS:  This is a 76 year old female with no prior  cardiac history with the exception of atrial fibrillation in 12/99 which  resolved after three months of therapy.  She had chest pain 5/10 for 24  hours.  The patient had onset of chest pain while shopping with dyspnea and  chest tightness.  She took aspirin with some improvement but awoke today  with pain again.  The patient had radiating aching in her left arm since the  beginning.  She was admitted to [facility] to rule out MI.  She was  placed on Plavix.  She had negative cardiac enzymes and negative D-dimer.  She underwent a cardiac catheterization on June 21 which showed left main  mild LAD 30 to 40% proximal to diagonal, left circumflex bifurcating and  mild, RCA dominant 40 to 50% proximal with an EF of greater than 55%.  The  patient was to have noncritical CAD medical management, PPI x eight weeks  and follow up with her primary care physician in two weeks.  The patient was  placed on Aceon 4 mg for hypertension as well as Lipitor for  hypercholesterolemia.  Cholesterol was 243, triglycerides 92, HDL 49 with an  LDL of 176.  The patient was discharged to home in stable condition on  coated aspirin 325 mg daily, Desyrel 100 mg nightly, Lipitor 20 nightly,  Aceon 4 daily, Prilosec 20 mg daily for eight weeks.  She is to take Tylenol  1 to 2 tablets every four to six hours as needed.  No  strenuous activity for  four days.  No driving for two days.  Low-fat, low-cholesterol, low-salt  diet.  She is to call if she develops any drainage or lump in her groin.  She was to schedule an appointment with [facility].  The patient was supplied the telephone number and instructed to make an  appointment for the week of July 5.  At that time, the patient will have a  BMET drawn for new start ACE inhibitor and also patient is newly started on  Lipitor and will need management of her cholesterol and LFTs.     Chinita Pester, C.R.N.P. [facility]                 Learta Codding,  M.D.    DS/MEDQ  D:  07/29/2002  T:  07/30/2002  Job:  [id number]   cc:   Angelena Sole, M.D. [facility]    cc:   Angelena Sole, M.D. [facility]

## 2010-07-12 ENCOUNTER — Other Ambulatory Visit: Payer: Self-pay | Admitting: *Deleted

## 2010-07-12 ENCOUNTER — Encounter: Payer: Self-pay | Admitting: Cardiology

## 2010-07-12 DIAGNOSIS — E785 Hyperlipidemia, unspecified: Secondary | ICD-10-CM

## 2010-07-13 ENCOUNTER — Other Ambulatory Visit: Payer: Self-pay | Admitting: Cardiology

## 2010-07-13 ENCOUNTER — Other Ambulatory Visit (INDEPENDENT_AMBULATORY_CARE_PROVIDER_SITE_OTHER): Payer: Medicare Other | Admitting: *Deleted

## 2010-07-13 ENCOUNTER — Ambulatory Visit (INDEPENDENT_AMBULATORY_CARE_PROVIDER_SITE_OTHER): Payer: Medicare Other | Admitting: Cardiology

## 2010-07-13 ENCOUNTER — Encounter: Payer: Self-pay | Admitting: Cardiology

## 2010-07-13 VITALS — BP 132/82 | HR 62 | Ht 65.0 in | Wt 155.2 lb

## 2010-07-13 DIAGNOSIS — I4891 Unspecified atrial fibrillation: Secondary | ICD-10-CM

## 2010-07-13 DIAGNOSIS — I10 Essential (primary) hypertension: Secondary | ICD-10-CM

## 2010-07-13 DIAGNOSIS — E785 Hyperlipidemia, unspecified: Secondary | ICD-10-CM

## 2010-07-13 LAB — HEPATIC FUNCTION PANEL
ALT: 14 U/L (ref 0–35)
AST: 17 U/L (ref 0–37)
Alkaline Phosphatase: 77 U/L (ref 39–117)
Bilirubin, Direct: 0.1 mg/dL (ref 0.0–0.3)
Total Bilirubin: 0.4 mg/dL (ref 0.3–1.2)

## 2010-07-13 LAB — BASIC METABOLIC PANEL
Chloride: 103 mEq/L (ref 96–112)
Potassium: 4.4 mEq/L (ref 3.5–5.1)
Sodium: 138 mEq/L (ref 135–145)

## 2010-07-13 LAB — LIPID PANEL
Total CHOL/HDL Ratio: 3
Triglycerides: 70 mg/dL (ref 0.0–149.0)

## 2010-07-13 LAB — LDL CHOLESTEROL, DIRECT: Direct LDL: 154.7 mg/dL

## 2010-07-14 NOTE — Progress Notes (Signed)
Subjective:   Cheryl [name] is seen today for followup visit.overall, she's doing reasonably well but has had episodes of dizziness while doingexercises and breathing exercises. He's had no atrial fibrillation since she has lost approximately 50 pounds weight and has been on flecainide. She had some interruption of her exercise program because low back pain but that has resolved and she is returned to her exercise program. Her blood pressure readings have been satisfactory in general. She did have a remote history of catheterization in 2004 with mild somewhat diffuse cornea atherosclerosis. She does have a history of mild hypertension.  Current Outpatient Prescriptions  Medication Sig Dispense Refill  . aspirin 81 MG tablet Take 81 mg by mouth daily.        . Cholecalciferol (VITAMIN D) 2000 UNITS tablet Take 2,000 Units by mouth daily.        . fish oil-omega-3 fatty acids 1000 MG capsule Take by mouth daily.        . flecainide (TAMBOCOR) 50 MG tablet Take 1 tablet (50 mg total) by mouth 2 (two) times daily.  60 tablet  6  . losartan (COZAAR) 50 MG tablet take 1 tablet by mouth once daily  30 tablet  6  . traZODone (DESYREL) 100 MG tablet Take 100 mg by mouth at bedtime.          Allergies  Allergen Reactions  . Amlodipine Besylate     REACTION: dizzy  . Codeine Phosphate     REACTION: unspecified  . Lipitor (Atorvastatin Calcium)   . Sulfonamide Derivatives     REACTION: unspecified    Patient Active Problem List  Diagnoses  . HYPERLIPIDEMIA  . HYPERTENSION  . CORONARY ARTERY DISEASE  . ATRIAL FIBRILLATION  . CHEST PAIN    History  Smoking status  . Never Smoker   Smokeless tobacco  . Not on file    History  Alcohol Use No    Family History  Problem Relation Age of Onset  . Lung cancer Father     Review of Systems:   The patient denies any heat or cold intolerance.  No weight gain or weight loss.  The patient denies headaches or blurry vision.  There is no cough or  sputum production.  The patient denies dizziness.  There is no hematuria or hematochezia.  The patient denies any muscle aches or arthritis.  The patient denies any rash.  The patient denies frequent falling or instability.  There is no history of depression or anxiety.  All other systems were reviewed and are negative.   Physical Exam:   Her weight is 155. Blood pressure 132/82 sitting, heart rate 62.The head is normocephalic and atraumatic.  Pupils are equally round and reactive to light.  Sclerae nonicteric.  Conjunctiva is clear.  Oropharynx is unremarkable.  There's adequate oral airway.  Neck is supple there are no masses.  Thyroid is not enlarged.  There is no lymphadenopathy.  Lungs are clear.  Chest is symmetric.  Heart shows a regular rate and rhythm.  S1 and S2 are normal.  There is no murmur click or gallop.  Abdomen is soft normal bowel sounds.  There is no organomegaly.  Genital and rectal deferred.  Extremities are without edema.  Peripheral pulses are adequate.  Neurologically intact.  Full range of motion.  The patient is not depressed.  Skin is warm and dry.  Assessment / Plan:

## 2010-07-14 NOTE — Assessment & Plan Note (Signed)
Blood pressure readings are well-controlled at this time.

## 2010-07-14 NOTE — Assessment & Plan Note (Signed)
She's had no recurrence of atrial fibrillation since she's been on flecainide but more importantly since she's lost approximately 50 pounds of weight has been exercising regularly. I encouraged her to stay on aspirin 81 mg a day but I did discuss the possibility of Coumadin sometime in the future should she have recurrence of atrial fibrillation.

## 2010-07-18 ENCOUNTER — Telehealth: Payer: Self-pay | Admitting: *Deleted

## 2010-07-18 NOTE — Telephone Encounter (Signed)
Patient called with lab results. msg left to call with any Thomas Hoff RN

## 2010-07-20 ENCOUNTER — Encounter: Payer: Self-pay | Admitting: Cardiology

## 2010-08-07 DIAGNOSIS — R55 Syncope and collapse: Secondary | ICD-10-CM

## 2010-08-07 DIAGNOSIS — IMO0001 Reserved for inherently not codable concepts without codable children: Secondary | ICD-10-CM

## 2010-08-07 HISTORY — DX: Reserved for inherently not codable concepts without codable children: IMO0001

## 2010-08-07 HISTORY — DX: Syncope and collapse: R55

## 2010-08-11 ENCOUNTER — Ambulatory Visit (INDEPENDENT_AMBULATORY_CARE_PROVIDER_SITE_OTHER): Payer: Medicare Other | Admitting: Cardiovascular Disease

## 2010-08-11 ENCOUNTER — Encounter: Payer: Self-pay | Admitting: Cardiovascular Disease

## 2010-08-11 VITALS — BP 128/88 | HR 70 | Wt 157.0 lb

## 2010-08-11 DIAGNOSIS — R0789 Other chest pain: Secondary | ICD-10-CM

## 2010-08-11 DIAGNOSIS — I480 Paroxysmal atrial fibrillation: Secondary | ICD-10-CM

## 2010-08-11 DIAGNOSIS — R079 Chest pain, unspecified: Secondary | ICD-10-CM

## 2010-08-11 DIAGNOSIS — R55 Syncope and collapse: Secondary | ICD-10-CM

## 2010-08-11 DIAGNOSIS — I4891 Unspecified atrial fibrillation: Secondary | ICD-10-CM

## 2010-08-11 LAB — TROPONIN I: Troponin I: <0.3 ng/mL (ref <0.30)

## 2010-08-11 MED ORDER — FLECAINIDE ACETATE 50 MG PO TABS: 50.0000 mg | ORAL_TABLET | ORAL | Status: DC

## 2010-08-11 NOTE — Assessment & Plan Note (Signed)
She has not had much atrial fibrillation since starting the flecainide. She is asymptomatic with her atrial fibrillation 70 we will for [place] to try to suppress it is much as possible. She's not having these episodes of chest tightness and syncope/near syncope. We will try decreasing her flecainide to 50 mg at night to see if this helps. If she has recurrent atrial ablation, then we may need to find a different medication. I told her that we will work toward finding a suitable medication.  At present she is just on aspirin once a day. I've asked her to increase her aspirin to 325 mg a day. At present she is very well controlled I do not think that she needs Coumadin

## 2010-08-11 NOTE — Assessment & Plan Note (Signed)
The patient has had several unusual episodes that involved chest tightness and profound dizziness. She's had a fairly normal heart catheterization back in 2004. She's on flexion I suspect it will be important for [place] to rule out coronary artery disease. We will draw CPK-MB and troponin levels today. We'll schedule her for a stress Myoview study. I'll also like to get an echocardiogram for further evaluation of her left ventricular function and valvular function.

## 2010-08-11 NOTE — Assessment & Plan Note (Signed)
Cheryl [name]'s episode of chest pain was also followed by an episode of syncope. Possible etiology includes hypotension due to volume depletion. She also could have had an episode of bradycardia or a  sinus pause.  Is also possible that she had an arrhythmia.  I would like to get an echocardiogram for further evaluation of her syncope. We have placed an event monitor on her for further evaluation of her cardiac rhythm. We will be getting a stress Myoview study to rule out ischemia.  I'll have a decreased her flecainide to 50 mg at night to see if this helps with these episodes. I'll see her again in one month.

## 2010-08-11 NOTE — Progress Notes (Addendum)
Cheryl [name] Date of Birth  1934-10-31 [facility] [address]     [address] [place], [place]  [address] [phone]  Fax  [phone]  History of Present Illness:  76 yo with PAF and HTN.  Was up in [facility] yesterday.  Out in the sun.  Was standing in the sun waiting for ice cream.   Had chest pressure and dyspnea.    Felt hot and dizzy. Was unresponsive for several minutes.   Went to the ER in [place].  They advised her to stay but she wanted to come home.   All of her evaluation was normal.  Today, she feels tired and washed out.    Has PAF in the past but none since she has been on Flecainide.  She was moderately symptomatic with these episodes.  She has had 2 of these episodes 1 months ago.  Lightheadedness.   Current Outpatient Prescriptions on File Prior to Visit  Medication Sig Dispense Refill  . aspirin 81 MG tablet Take 81 mg by mouth daily.        . Cholecalciferol (VITAMIN D) 2000 UNITS tablet Take 2,000 Units by mouth daily.        . fish oil-omega-3 fatty acids 1000 MG capsule Take by mouth daily.        . flecainide (TAMBOCOR) 50 MG tablet Take 1 tablet (50 mg total) by mouth 2 (two) times daily.  60 tablet  6  . losartan (COZAAR) 50 MG tablet take 1 tablet by mouth once daily  30 tablet  6  . traZODone (DESYREL) 100 MG tablet Take 100 mg by mouth at bedtime.          Allergies  Allergen Reactions  . Amlodipine Besylate     REACTION: dizzy  . Codeine Phosphate     REACTION: unspecified  . Lipitor (Atorvastatin Calcium)   . Sulfonamide Derivatives     REACTION: unspecified    Past Medical History  Diagnosis Date  . Hypertension   . Hyperlipidemia   . PAF (paroxysmal atrial fibrillation)   . Obesity   . History of heartburn     Past Surgical History  Procedure Date  . Cardiac catheterization 07/28/2002    EF GREATER THAN 55%  . Back surgery   . Appendectomy     History  Smoking status  . Never Smoker    Smokeless tobacco  . Not on file    History  Alcohol Use No    Family History  Problem Relation Age of Onset  . Lung cancer Father     Reviw of Systems:  Reviewed in the HPI.  All other systems are negative.  Physical Exam: BP 128/88  Pulse 70  Wt 157 lb (71.215 kg) The patient is alert and oriented x 3.  The mood and affect are normal.   Skin: warm and dry.  Color is normal.    HEENT:   the sclera are nonicteric.  The mucous membranes are moist.  The carotids are 2+ without bruits.  There is no thyromegaly.  There is no JVD.    Lungs: clear.  The chest wall is non tender.    Heart: regular rate with a normal S1 and S2.  There are no murmurs, gallops, or rubs. The PMI is not displaced.     Abdomin: good bowel sounds.  There is no guarding or rebound.  There is no hepatosplenomegaly or tenderness.  There are no  masses.   Extremities:  no clubbing, cyanosis, or edema.  The legs are without rashes.  The distal pulses are intact.   Neuro:  Cranial nerves II - XII are intact.  Motor and sensory functions are intact.    The gait is normal.  ECG: Her EKG reveals normal sinus rhythm. She has no ST or T wave changes. Assessment / Plan:

## 2010-08-12 ENCOUNTER — Telehealth: Payer: Self-pay | Admitting: *Deleted

## 2010-08-12 NOTE — Telephone Encounter (Signed)
Message copied by Antony Odea on Fri Aug 12, 2010  4:27 PM ------      Message from: Vesta Mixer      Created: Fri Aug 12, 2010 12:40 PM       Cardiac enzymes are normal. No evidence that the episode was acute coronary syndrome

## 2010-08-15 ENCOUNTER — Encounter: Payer: Self-pay | Admitting: Cardiovascular Disease

## 2010-08-16 ENCOUNTER — Encounter: Payer: Self-pay | Admitting: *Deleted

## 2010-08-22 ENCOUNTER — Ambulatory Visit: Payer: Medicare Other | Attending: Internal Medicine | Admitting: Physical Therapy

## 2010-08-22 DIAGNOSIS — M546 Pain in thoracic spine: Secondary | ICD-10-CM | POA: Insufficient documentation

## 2010-08-22 DIAGNOSIS — M542 Cervicalgia: Secondary | ICD-10-CM | POA: Insufficient documentation

## 2010-08-22 DIAGNOSIS — IMO0001 Reserved for inherently not codable concepts without codable children: Secondary | ICD-10-CM | POA: Insufficient documentation

## 2010-08-22 DIAGNOSIS — M2569 Stiffness of other specified joint, not elsewhere classified: Secondary | ICD-10-CM | POA: Insufficient documentation

## 2010-08-29 ENCOUNTER — Ambulatory Visit: Payer: Medicare Other | Admitting: Physical Therapy

## 2010-08-29 ENCOUNTER — Encounter: Payer: Self-pay | Admitting: Cardiovascular Disease

## 2010-08-30 ENCOUNTER — Ambulatory Visit (HOSPITAL_BASED_OUTPATIENT_CLINIC_OR_DEPARTMENT_OTHER): Payer: Medicare Other | Admitting: Radiology

## 2010-08-30 ENCOUNTER — Ambulatory Visit (HOSPITAL_COMMUNITY): Payer: Medicare Other | Attending: Cardiovascular Disease

## 2010-08-30 DIAGNOSIS — I4949 Other premature depolarization: Secondary | ICD-10-CM

## 2010-08-30 DIAGNOSIS — R55 Syncope and collapse: Secondary | ICD-10-CM

## 2010-08-30 DIAGNOSIS — R079 Chest pain, unspecified: Secondary | ICD-10-CM

## 2010-08-30 DIAGNOSIS — E785 Hyperlipidemia, unspecified: Secondary | ICD-10-CM | POA: Insufficient documentation

## 2010-08-30 DIAGNOSIS — R072 Precordial pain: Secondary | ICD-10-CM | POA: Insufficient documentation

## 2010-08-30 DIAGNOSIS — I379 Nonrheumatic pulmonary valve disorder, unspecified: Secondary | ICD-10-CM | POA: Insufficient documentation

## 2010-08-30 DIAGNOSIS — I079 Rheumatic tricuspid valve disease, unspecified: Secondary | ICD-10-CM | POA: Insufficient documentation

## 2010-08-30 DIAGNOSIS — I10 Essential (primary) hypertension: Secondary | ICD-10-CM | POA: Insufficient documentation

## 2010-08-30 DIAGNOSIS — R0602 Shortness of breath: Secondary | ICD-10-CM

## 2010-08-30 DIAGNOSIS — R0789 Other chest pain: Secondary | ICD-10-CM

## 2010-08-30 DIAGNOSIS — R0609 Other forms of dyspnea: Secondary | ICD-10-CM

## 2010-08-30 DIAGNOSIS — I059 Rheumatic mitral valve disease, unspecified: Secondary | ICD-10-CM | POA: Insufficient documentation

## 2010-08-30 MED ORDER — TECHNETIUM TC 99M TETROFOSMIN IV KIT
33.0000 | PACK | Freq: Once | INTRAVENOUS | Status: AC | PRN
  Administered 2010-08-30: 33 via INTRAVENOUS

## 2010-08-30 MED ORDER — TECHNETIUM TC 99M TETROFOSMIN IV KIT
10.7000 | PACK | Freq: Once | INTRAVENOUS | Status: AC | PRN
  Administered 2010-08-30: 11 via INTRAVENOUS

## 2010-08-30 NOTE — Progress Notes (Addendum)
[facility] SITE 3 NUCLEAR MED [address] [place] [address] [phone]  Cardiology Nuclear Med Study  Cheryl [name] is a 76 y.o. female [id number] 19-Apr-1934   Nuclear Med Background Indication for Stress Test:  Evaluation for Ischemia History: '09 Echo: EF 55-60%, 06/04 Heart Catheterization: EF 55% N/O CAD and '09 Myocardial Perfusion Study NL EF 72 % Cardiac Risk Factors: Family History - CAD, Hypertension and Lipids  Symptoms:  Chest Pressure, Chest Tightness, Dizziness, DOE, Fatigue, SOB and Syncope   Nuclear Pre-Procedure Caffeine/Decaff Intake:  None NPO After: 9:00pm   Lungs:  clear IV 0.9% NS with Angio Cath:  22g  IV Site: R Antecubital  IV Started by:  Irean Hong, RN  Chest Size (in):  34 Cup Size: B  Height: 5\' 6"  (1.676 m)  Weight:  156 lb (70.761 kg)  BMI:  Body mass index is 25.18 kg/(m^2). Tech Comments:  This patient walked the treadmill, had HTN response. During exercise the patient had 9/10 chest tightness. Check pictures.    Nuclear Med Study 1 or 2 day study: 1 day  Stress Test Type:  Stress  Reading MD: Charlton Haws, MD  Order Authorizing Provider:  P.Nahser  Resting Radionuclide: Technetium 19m Tetrofosmin  Resting Radionuclide Dose: 10.7 mCi   Stress Radionuclide:  Technetium 49m Tetrofosmin  Stress Radionuclide Dose: 33.0 mCi           Stress Protocol Rest HR: 72 Stress HR: 133  Rest BP: 178/80 Stress BP: 222/97  Exercise Time (min): 4:00 METS: 5.8   Predicted Max HR: 146 bpm % Max HR: 91.1 bpm Rate Pressure Product: 21308   Dose of Adenosine (mg):  n/a Dose of Lexiscan: n/a mg  Dose of Atropine (mg): n/a Dose of Dobutamine: n/a mcg/kg/min (at max HR)  Stress Test Technologist: Milana Na, EMT-P  Nuclear Technologist:  Domenic Polite, CNMT     Rest Procedure:  Myocardial perfusion imaging was performed at rest 45 minutes following the intravenous administration of Technetium 41m Tetrofosmin. Rest ECG:  NSR  Stress Procedure:  The patient exercised for 4:00.  The patient stopped due to chest tightness, sob, and denied any chest pressure.  There were no significant ST-T wave changes.  Technetium 22m Tetrofosmin was injected at peak exercise and myocardial perfusion imaging was performed after a brief delay. Stress ECG: No significant change from baseline ECG  QPS Raw Data Images:  Normal; no motion artifact; normal heart/lung ratio. Stress Images:  Normal homogeneous uptake in all areas of the myocardium. Rest Images:  Normal homogeneous uptake in all areas of the myocardium. Subtraction (SDS):  Normal Transient Ischemic Dilatation (Normal <1.22):  0.95 Lung/Heart Ratio (Normal <0.45):  0.33  Quantitative Gated Spect Images QGS EDV:  66 ml QGS ESV:  22 ml QGS cine images:  NL LV Function; NL Wall Motion QGS EF: 67%  Impression Exercise Capacity:  Poor exercise capacity. BP Response:  Hypertensive blood pressure response. Clinical Symptoms:  Mild chest pain/dyspnea. ECG Impression:  No significant ST segment change suggestive of ischemia. Comparison with Prior Nuclear Study: No images to compare  Overall Impression:  Normal stress nuclear study.       Charlton Haws    08/31/10 Please report normal study to patient.  Elyn Aquas.

## 2010-08-31 NOTE — Progress Notes (Signed)
Pt informed

## 2010-08-31 NOTE — Progress Notes (Signed)
nuc med report routed to Dr. Elease Hashimoto 08/31/10 Cheryl [name]

## 2010-08-31 NOTE — Progress Notes (Signed)
Results given app made

## 2010-09-01 ENCOUNTER — Ambulatory Visit: Payer: Medicare Other | Admitting: Physical Therapy

## 2010-09-05 ENCOUNTER — Encounter: Payer: Medicare Other | Admitting: Physical Therapy

## 2010-09-21 ENCOUNTER — Ambulatory Visit: Payer: Medicare Other | Admitting: Cardiovascular Disease

## 2010-09-22 ENCOUNTER — Encounter: Payer: Self-pay | Admitting: Cardiovascular Disease

## 2010-09-23 ENCOUNTER — Encounter: Payer: Self-pay | Admitting: Nurse Practitioner

## 2010-09-23 ENCOUNTER — Encounter: Payer: Self-pay | Admitting: Cardiovascular Disease

## 2010-09-23 ENCOUNTER — Ambulatory Visit (INDEPENDENT_AMBULATORY_CARE_PROVIDER_SITE_OTHER): Payer: Medicare Other | Admitting: Nurse Practitioner

## 2010-09-23 VITALS — BP 132/92 | HR 68 | Ht 66.0 in | Wt 161.4 lb

## 2010-09-23 DIAGNOSIS — I2510 Atherosclerotic heart disease of native coronary artery without angina pectoris: Secondary | ICD-10-CM

## 2010-09-23 DIAGNOSIS — I4891 Unspecified atrial fibrillation: Secondary | ICD-10-CM

## 2010-09-23 DIAGNOSIS — R55 Syncope and collapse: Secondary | ICD-10-CM

## 2010-09-23 DIAGNOSIS — I480 Paroxysmal atrial fibrillation: Secondary | ICD-10-CM

## 2010-09-23 DIAGNOSIS — R079 Chest pain, unspecified: Secondary | ICD-10-CM

## 2010-09-23 DIAGNOSIS — I10 Essential (primary) hypertension: Secondary | ICD-10-CM

## 2010-09-23 NOTE — Assessment & Plan Note (Signed)
I have discussed her care with Dr. Elease Hashimoto. For now we will leave her with her current regimen. She does not wish to stop the Flecainide even though she may have had less "weak" spells with the decrease in the dosage. She is willing to keep a diary over the next 2 months. She does not need further testing. She will see Dr. Elease Hashimoto on return. Patient is agreeable to this plan and will call if any problems develop in the interim.

## 2010-09-23 NOTE — Assessment & Plan Note (Signed)
While her blood pressure is not well controlled, we will leave her on the current regimen. She will continue to monitor at home.

## 2010-09-23 NOTE — Assessment & Plan Note (Signed)
Has history of mild CAD per remote cath in 2004. Her nuclear stress test is normal.

## 2010-09-23 NOTE — Patient Instructions (Signed)
Keep a diary of how you are feeling. Document your weak spells. Check your blood pressure and keep a record. Keep a record if you have recurrent atrial fib We will see you back in 2 months. Call for any problems.

## 2010-09-23 NOTE — Progress Notes (Signed)
Cheryl [name] Date of Birth: [birth year]   History of Present Illness: Cheryl [name] is seen back today for her 1 month check. She is seen for Dr. Elease Hashimoto. She is a former patient of Dr. Harvie Bridge. She had an episode of sync[place] the [place]. Her echo shows moderate LVH, grade I diastolic dysfunction. Stress test was normal. Event monitor showed no pauses. Her flecainide was cut back to just 50 mg a day. She continues to have these "weak" spells where she has to lie down and drink some water. She says she would probably pass out if she was not able to lie down. Blood pressure is fine during these episodes. It has actually been high at home and lower after exercise. No further chest pain. She is not wanting to discontinue her flecainide. Her atrial fib she says is more debilitating.   Current Outpatient Prescriptions on File Prior to Visit  Medication Sig Dispense Refill  . aspirin 81 MG tablet Take 81 mg by mouth daily.        . Cholecalciferol (VITAMIN D) 2000 UNITS tablet Take 2,000 Units by mouth daily.        . fish oil-omega-3 fatty acids 1000 MG capsule Take by mouth daily.        . flecainide (TAMBOCOR) 50 MG tablet Take 1 tablet (50 mg total) by mouth 1 day or 1 dose.  60 tablet  6  . Loratadine (KLS ALLERCLEAR PO) Take by mouth daily.        Marland Kitchen losartan (COZAAR) 50 MG tablet take 1 tablet by mouth once daily  30 tablet  6  . traZODone (DESYREL) 100 MG tablet Take 100 mg by mouth at bedtime.          Allergies  Allergen Reactions  . Amlodipine Besylate     REACTION: dizzy  . Codeine Phosphate     REACTION: unspecified  . Lipitor (Atorvastatin Calcium)   . Sulfonamide Derivatives     REACTION: unspecified    Past Medical History  Diagnosis Date  . Hypertension   . Hyperlipidemia   . PAF (paroxysmal atrial fibrillation)   . Obesity   . History of heartburn   . High risk medication use     Flecainide therapy  . Syncope   . Normal nuclear stress test July 2012    . LVH (left ventricular hypertrophy)     Per echo in July 2012  . Diastolic dysfunction     Grade I, per echo in 2012  . CAD (coronary artery disease)     Mild per remote cath in 2004; normal nuclear in 2012    Past Surgical History  Procedure Date  . Cardiac catheterization 07/28/2002    EF GREATER THAN 55%; MILD CAD  . Back surgery   . Appendectomy     History  Smoking status  . Never Smoker   Smokeless tobacco  . Never Used    History  Alcohol Use No    Family History  Problem Relation Age of Onset  . Lung cancer Father     Review of Systems: The review of systems is positive for back pain. She will be having some injections in the near future.  All other systems were reviewed and are negative.  Physical Exam: BP 132/92  Pulse 68  Ht 5\' 6"  (1.676 m)  Wt 161 lb 6.4 oz (73.211 kg)  BMI 26.05 kg/m2 Patient is very pleasant and in no acute distress. Skin  is warm and dry. Color is normal.  HEENT is unremarkable. Normocephalic/atraumatic. PERRL. Sclera are nonicteric. Neck is supple. No masses. No JVD. Lungs are clear. Cardiac exam shows a regular rate and rhythm. Abdomen is soft. Extremities are without edema. Gait and ROM are intact. No gross neurologic deficits noted.   LABORATORY DATA:   Assessment / Plan:

## 2010-11-07 LAB — PROTIME-INR
INR: 2.8 — ABNORMAL HIGH
Prothrombin Time: 31.7 — ABNORMAL HIGH
Prothrombin Time: 36.8 — ABNORMAL HIGH

## 2010-11-07 LAB — COMPREHENSIVE METABOLIC PANEL
Albumin: 3.5
BUN: 24 — ABNORMAL HIGH
Chloride: 106
Creatinine, Ser: 1.11
GFR calc non Af Amer: 48 — ABNORMAL LOW
Glucose, Bld: 96
Total Bilirubin: 0.7

## 2010-11-07 LAB — CBC
HCT: 39.7
MCV: 93.6
Platelets: 225
WBC: 6.7

## 2010-11-23 ENCOUNTER — Ambulatory Visit: Payer: Medicare Other | Admitting: Nurse Practitioner

## 2010-11-28 ENCOUNTER — Encounter: Payer: Self-pay | Admitting: Nurse Practitioner

## 2010-11-28 ENCOUNTER — Ambulatory Visit (INDEPENDENT_AMBULATORY_CARE_PROVIDER_SITE_OTHER): Payer: Medicare Other | Admitting: Nurse Practitioner

## 2010-11-28 VITALS — BP 145/91 | HR 73 | Ht 64.0 in | Wt 164.0 lb

## 2010-11-28 DIAGNOSIS — I10 Essential (primary) hypertension: Secondary | ICD-10-CM

## 2010-11-28 DIAGNOSIS — I4891 Unspecified atrial fibrillation: Secondary | ICD-10-CM

## 2010-11-28 DIAGNOSIS — I2510 Atherosclerotic heart disease of native coronary artery without angina pectoris: Secondary | ICD-10-CM

## 2010-11-28 NOTE — Assessment & Plan Note (Signed)
Has known mild disease per remote cath. Negative nuclear in 2012. Remains asymptomatic.

## 2010-11-28 NOTE — Patient Instructions (Addendum)
Stay on your current medicines. Monitor your blood pressure at home.   Stay active.  We will see you back in 4 months.   Call for any problems.

## 2010-11-28 NOTE — Progress Notes (Signed)
Cheryl [name] Date of Birth: Jun 25, 1934 Medical Record #[id number]  History of Present Illness: Cheryl [name] is seen back today for her 2 month check. She is seen for Dr. Elease Hashimoto. She is a former patient of Dr. Ronnald Nian. She had an episode of [place] about 3 months ago. Her echo showed moderate LVH, grade 1 diastolic dysfunction, stress test was normal and event monitor showed no pauses. Flecainide was cut back to 50 mg. We discussed stopping the flecainide but she did not want to discontinue, saying the atrial fib is more debilitating.   She comes back in today for her 2 month check. She has been doing ok. She has had no recurrent syncope. No chest pain or shortness of breath. Overall she thinks she is doing pretty good. She has had a few "weak spells" but she says she "is fine". She was at the beach at the beginning of September and stopped her Flecainide and Losartan for 2 days. She had a short recurrent episode of atrial fib and got back on the Flecainide. Her blood pressures at home are very well controlled. She says she is just "adapting" to her age.   Current Outpatient Prescriptions on File Prior to Visit  Medication Sig Dispense Refill  . aspirin 81 MG tablet Take 81 mg by mouth daily.        . Cholecalciferol (VITAMIN D) 2000 UNITS tablet Take 2,000 Units by mouth daily.        . fish oil-omega-3 fatty acids 1000 MG capsule Take by mouth daily.        . flecainide (TAMBOCOR) 50 MG tablet Take 1 tablet (50 mg total) by mouth 1 day or 1 dose.  60 tablet  6  . Loratadine (KLS ALLERCLEAR PO) Take by mouth daily.        . traZODone (DESYREL) 100 MG tablet Take 100 mg by mouth at bedtime.          Allergies  Allergen Reactions  . Amlodipine Besylate     REACTION: dizzy  . Codeine Phosphate     REACTION: unspecified  . Lipitor (Atorvastatin Calcium)   . Sulfonamide Derivatives     REACTION: unspecified    Past Medical History  Diagnosis Date  . Hypertension   .  Hyperlipidemia   . PAF (paroxysmal atrial fibrillation)   . Obesity   . History of heartburn   . High risk medication use     Flecainide therapy  . Syncope July 2012  . Normal nuclear stress test July 2012  . LVH (left ventricular hypertrophy)     Per echo in July 2012  . Diastolic dysfunction     Grade I, per echo in 2012  . CAD (coronary artery disease)     Mild per remote cath in 2004; normal nuclear in 2012    Past Surgical History  Procedure Date  . Cardiac catheterization 07/28/2002    EF GREATER THAN 55%; MILD CAD  . Back surgery   . Appendectomy     History  Smoking status  . Never Smoker   Smokeless tobacco  . Never Used    History  Alcohol Use No    Family History  Problem Relation Age of Onset  . Lung cancer Father     Review of Systems: The review of systems is per the HPI.  All other systems were reviewed and are negative.  Physical Exam: BP 145/91  Pulse 73  Ht 5\' 4"  (1.626  m)  Wt 164 lb (74.39 kg)  BMI 28.15 kg/m2 Patient is very pleasant and in no acute distress. Skin is warm and dry. Color is normal.  HEENT is unremarkable. Normocephalic/atraumatic. PERRL. Sclera are nonicteric. Neck is supple. No masses. No JVD. Lungs are clear. Cardiac exam shows a regular rate and rhythm. Abdomen is soft. Extremities are without edema. Gait and ROM are intact. No gross neurologic deficits noted.   LABORATORY DATA: N/A   Assessment / Plan:

## 2010-11-28 NOTE — Assessment & Plan Note (Signed)
Her blood pressure is very well controlled at home. I have left her off the Losartan. She is agreeable to continuing to check it at home. She will call for any problems in the interim.

## 2010-11-28 NOTE — Assessment & Plan Note (Signed)
Only one short lived recurrence. She remains on aspirin therapy and low dose Flecainide. She is pleased with how she is doing. We will see her back in about 4 months. Patient is agreeable to this plan and will call if any problems develop in the interim.

## 2011-04-07 ENCOUNTER — Other Ambulatory Visit: Payer: Self-pay | Admitting: Gynecology

## 2011-04-07 DIAGNOSIS — Z1231 Encounter for screening mammogram for malignant neoplasm of breast: Secondary | ICD-10-CM

## 2011-05-03 ENCOUNTER — Ambulatory Visit
Admission: RE | Admit: 2011-05-03 | Discharge: 2011-05-03 | Disposition: A | Discharge status: Home / Self Care | Payer: Medicare Other | Source: Ambulatory Visit | Attending: Gynecology | Admitting: Gynecology

## 2011-05-03 DIAGNOSIS — Z1231 Encounter for screening mammogram for malignant neoplasm of breast: Secondary | ICD-10-CM

## 2011-05-08 ENCOUNTER — Other Ambulatory Visit: Payer: Self-pay | Admitting: Gynecology

## 2011-05-08 ENCOUNTER — Other Ambulatory Visit: Payer: Self-pay | Admitting: Cardiology

## 2011-05-08 DIAGNOSIS — R928 Other abnormal and inconclusive findings on diagnostic imaging of breast: Secondary | ICD-10-CM

## 2011-05-11 ENCOUNTER — Ambulatory Visit
Admission: RE | Admit: 2011-05-11 | Discharge: 2011-05-11 | Disposition: A | Discharge status: Home / Self Care | Payer: Medicare Other | Source: Ambulatory Visit | Attending: Gynecology | Admitting: Gynecology

## 2011-05-11 DIAGNOSIS — R928 Other abnormal and inconclusive findings on diagnostic imaging of breast: Secondary | ICD-10-CM

## 2011-11-27 ENCOUNTER — Ambulatory Visit (INDEPENDENT_AMBULATORY_CARE_PROVIDER_SITE_OTHER): Payer: Medicare Other | Admitting: Cardiovascular Disease

## 2011-11-27 ENCOUNTER — Encounter: Payer: Self-pay | Admitting: Cardiovascular Disease

## 2011-11-27 VITALS — BP 148/100 | HR 70 | Ht 64.0 in | Wt 169.8 lb

## 2011-11-27 DIAGNOSIS — I10 Essential (primary) hypertension: Secondary | ICD-10-CM

## 2011-11-27 DIAGNOSIS — I4891 Unspecified atrial fibrillation: Secondary | ICD-10-CM

## 2011-11-27 MED ORDER — HYDROCHLOROTHIAZIDE 25 MG PO TABS: 25.0000 mg | ORAL_TABLET | Freq: Every day | ORAL | Status: DC

## 2011-11-27 MED ORDER — POTASSIUM CHLORIDE ER 10 MEQ PO TBCR: 10.0000 meq | EXTENDED_RELEASE_TABLET | Freq: Every day | ORAL | Status: DC

## 2011-11-27 NOTE — Assessment & Plan Note (Signed)
Her atrial fibrillation seems to be well-controlled. We'll continue with her same medications. 

## 2011-11-27 NOTE — Assessment & Plan Note (Signed)
Her blood pressure is well controlled. Continue current medications. 

## 2011-11-27 NOTE — Patient Instructions (Addendum)
Your physician recommends that you schedule a follow-up appointment in: 3 mo ov with Norma Fredrickson np  Your physician wants you to follow-up in: 1 year with dr Prescott Parma will receive a reminder letter in the mail two months in advance. If you don't receive a letter, please call our office to schedule the follow-up appointment.

## 2011-11-27 NOTE — Progress Notes (Signed)
Cheryl [name] Date of Birth  1934-09-02 [facility] / [facility] [address]     [address] [place], [place]  [address] [phone]  Fax  [phone]  Problems: 1. paroxysmal atrial fibrillation 2. Hypertension  History of Present Illness:  Cheryl [name] is a 77 year old female with a history of intermittent atrial fibrillation, hypertension. She's not had any further episodes of atrial fibrillation since being started on flecainide for the past several years.  Her blood pressure has been a little elevated. She has not been as careful with her salt intake as she would like to be. She's not been exercising on a regular basis.  Current Outpatient Prescriptions on File Prior to Visit  Medication Sig Dispense Refill  . aspirin 81 MG tablet Take 81 mg by mouth daily.        . Cholecalciferol (VITAMIN D) 2000 UNITS tablet Take 2,000 Units by mouth daily.        . fish oil-omega-3 fatty acids 1000 MG capsule Take by mouth daily.        . flecainide (TAMBOCOR) 50 MG tablet Take 1 tablet (50 mg total) by mouth daily.  30 tablet  6  . Loratadine (KLS ALLERCLEAR PO) Take by mouth daily.        . traZODone (DESYREL) 100 MG tablet Take 100 mg by mouth at bedtime.        Marland Kitchen DISCONTD: flecainide (TAMBOCOR) 50 MG tablet Take 1 tablet (50 mg total) by mouth 1 day or 1 dose.  60 tablet  6    Allergies  Allergen Reactions  . Amlodipine Besylate     REACTION: dizzy  . Codeine Phosphate     REACTION: unspecified  . Lipitor (Atorvastatin Calcium)   . Sulfonamide Derivatives     REACTION: unspecified    Past Medical History  Diagnosis Date  . Hypertension   . Hyperlipidemia   . PAF (paroxysmal atrial fibrillation)   . Obesity   . History of heartburn   . High risk medication use     Flecainide therapy  . Syncope July 2012  . Normal nuclear stress test July 2012  . LVH (left ventricular hypertrophy)     Per echo in July 2012  . Diastolic dysfunction     Grade  I, per echo in 2012  . CAD (coronary artery disease)     Mild per remote cath in 2004; normal nuclear in 2012    Past Surgical History  Procedure Date  . Cardiac catheterization 07/28/2002    EF GREATER THAN 55%; MILD CAD  . Back surgery   . Appendectomy     History  Smoking status  . Never Smoker   Smokeless tobacco  . Never Used    History  Alcohol Use No    Family History  Problem Relation Age of Onset  . Lung cancer Father     Reviw of Systems:  Reviewed in the HPI.  All other systems are negative.  Physical Exam: BP 148/100  Pulse 70  Ht 5\' 4"  (1.626 m)  Wt 169 lb 12.8 oz (77.021 kg)  BMI 29.15 kg/m2 The patient is alert and oriented x 3.  The mood and affect are normal.   Skin: warm and dry.  Color is normal.    HEENT:   the sclera are nonicteric.  The mucous membranes are moist.  The carotids are 2+ without bruits.  There is no thyromegaly.  There is no JVD.  Lungs: clear.  The chest wall is non tender.    Heart: regular rate with a normal S1 and S2.  There are no murmurs, gallops, or rubs. The PMI is not displaced.     Abdomin: good bowel sounds.  There is no guarding or rebound.  There is no hepatosplenomegaly or tenderness.  There are no masses.   Extremities:  no clubbing, cyanosis, or edema.  The legs are without rashes.  The distal pulses are intact.   Neuro:  Cranial nerves II - XII are intact.  Motor and sensory functions are intact.    The gait is normal.  ECG: Oct. 21, 2013- NSR at 70, occasional PACs, LAF,  Assessment / Plan:

## 2011-12-04 ENCOUNTER — Encounter: Payer: Self-pay | Admitting: Cardiology

## 2011-12-06 ENCOUNTER — Encounter: Payer: Self-pay | Admitting: Cardiology

## 2011-12-13 ENCOUNTER — Other Ambulatory Visit: Payer: Self-pay | Admitting: Cardiovascular Disease

## 2011-12-13 NOTE — Telephone Encounter (Signed)
Fax Received. Refill Completed. Cheryl [name] (R.M.A)   

## 2012-02-27 ENCOUNTER — Ambulatory Visit: Payer: Medicare Other | Admitting: Nurse Practitioner

## 2012-09-03 ENCOUNTER — Ambulatory Visit: Payer: Medicare Other | Attending: Orthopaedic Surgery | Admitting: Physical Therapy

## 2012-09-03 DIAGNOSIS — IMO0001 Reserved for inherently not codable concepts without codable children: Secondary | ICD-10-CM | POA: Insufficient documentation

## 2012-09-03 DIAGNOSIS — M545 Low back pain, unspecified: Secondary | ICD-10-CM | POA: Insufficient documentation

## 2012-09-03 DIAGNOSIS — M25559 Pain in unspecified hip: Secondary | ICD-10-CM | POA: Insufficient documentation

## 2012-09-05 ENCOUNTER — Ambulatory Visit: Payer: Medicare Other | Admitting: Physical Therapy

## 2012-11-27 ENCOUNTER — Encounter (INDEPENDENT_AMBULATORY_CARE_PROVIDER_SITE_OTHER): Payer: Self-pay

## 2012-11-27 ENCOUNTER — Encounter: Payer: Self-pay | Admitting: Cardiology

## 2012-11-27 ENCOUNTER — Ambulatory Visit (INDEPENDENT_AMBULATORY_CARE_PROVIDER_SITE_OTHER): Payer: Medicare Other | Admitting: Cardiology

## 2012-11-27 VITALS — BP 158/90 | HR 64 | Ht 64.0 in | Wt 168.0 lb

## 2012-11-27 DIAGNOSIS — I2510 Atherosclerotic heart disease of native coronary artery without angina pectoris: Secondary | ICD-10-CM

## 2012-11-27 DIAGNOSIS — I4891 Unspecified atrial fibrillation: Secondary | ICD-10-CM

## 2012-11-27 DIAGNOSIS — I10 Essential (primary) hypertension: Secondary | ICD-10-CM

## 2012-11-27 DIAGNOSIS — E785 Hyperlipidemia, unspecified: Secondary | ICD-10-CM

## 2012-11-27 NOTE — Patient Instructions (Signed)
Your physician recommends that you continue on your current medications as directed. Please refer to the Current Medication list given to you today.  Your physician wants you to follow-up in: 6 months with Dr. Skains. You will receive a reminder letter in the mail two months in advance. If you don't receive a letter, please call our office to schedule the follow-up appointment.  

## 2012-11-27 NOTE — Progress Notes (Signed)
[address] [place], [place]  [address] Phone: [phone] Fax:  [phone]  Date:  11/27/2012   ID:  Cheryl [name], DOB 11-01-1934, MRN [id number]  PCP:  Cala Bradford, MD   History of Present Illness: Cheryl [name] is a 78 y.o. female with atrial fibrillation currently on antiarrhythmic flecainide here for followup. She had not currently been on anticoagulation despite extensive counseling on risk of stroke. She refused.  Likely familial hyperlipidemia, LDL 211 in September of 2013. Does not wish to take statin medication.  Nor does she want to take chronic anticoagulation. Lengthy discussion.   Wt Readings from Last 3 Encounters:  11/27/12 168 lb (76.204 kg)  11/27/11 169 lb 12.8 oz (77.021 kg)  11/28/10 164 lb (74.39 kg)     Past Medical History  Diagnosis Date  . Hypertension   . Hyperlipidemia   . PAF (paroxysmal atrial fibrillation)   . Obesity   . History of heartburn   . High risk medication use     Flecainide therapy  . Syncope July 2012  . Normal nuclear stress test July 2012  . LVH (left ventricular hypertrophy)     Per echo in July 2012  . Diastolic dysfunction     Grade I, per echo in 2012  . CAD (coronary artery disease)     Mild per remote cath in 2004; normal nuclear in 2012    Past Surgical History  Procedure Laterality Date  . Cardiac catheterization  07/28/2002    EF GREATER THAN 55%; MILD CAD  . Back surgery    . Appendectomy      Current Outpatient Prescriptions  Medication Sig Dispense Refill  . aspirin 81 MG tablet Take 81 mg by mouth daily.        Marland Kitchen b complex vitamins capsule Take 1 capsule by mouth daily.      Marland Kitchen BIOTIN PO Take 1,000 mg by mouth daily.      . Cholecalciferol (VITAMIN D) 2000 UNITS tablet Take 2,000 Units by mouth daily.        . fish oil-omega-3 fatty acids 1000 MG capsule Take by mouth daily.        . flecainide (TAMBOCOR) 50 MG tablet take 1 tablet by mouth once daily  90 tablet  3  .  Loratadine (KLS ALLERCLEAR PO) Take by mouth daily.        . Multiple Vitamin (MULTIVITAMIN) capsule Take 1 capsule by mouth daily.      . pantoprazole (PROTONIX) 40 MG tablet Take 40 mg by mouth daily.      . traZODone (DESYREL) 100 MG tablet Take 100 mg by mouth at bedtime.         No current facility-administered medications for this visit.    Allergies:    Allergies  Allergen Reactions  . Amlodipine Besylate     REACTION: dizzy  . Codeine Phosphate     REACTION: unspecified  . Lipitor [Atorvastatin Calcium]   . Sulfonamide Derivatives     REACTION: unspecified    Social History:  The patient  reports that she has never smoked. She has never used smokeless tobacco. She reports that she does not drink alcohol or use illicit drugs.   ROS:  Please see the history of present illness.   Denies any syncope, palpitations, chest pain, orthopnea, rash.   All other systems reviewed and negative.   PHYSICAL EXAM: VS:  BP 158/90  Pulse 64  Ht 5\' 4"  (1.626 m)  Wt 168 lb (76.204 kg)  BMI 28.82 kg/m2 Well nourished, well developed, in no acute distress HEENT: normal Neck: no JVD Cardiac:  normal S1, S2; RRR; no murmur Lungs:  clear to auscultation bilaterally, no wheezing, rhonchi or rales Abd: soft, nontender, no hepatomegaly Ext: no edema Skin: warm and dry Neuro: no focal abnormalities noted  EKG:  Sinus rhythm left anterior fascicular block, PAC. No evidence of atrial fibrillation.     ASSESSMENT AND PLAN:  1. Paroxysmal atrial fibrillation-once again expressed the importance of anticoagulation. She has been informed about stroke risk. CHADS-VAS -3. She states that she worked so hard to get off of warfarin previously. I explained the benefits of the NOAC. She clearly refuses to take anticoagulation. If she changes her mind, she can always contact me. Continue with flecainide for now. She has not been taking her low-dose diltiazem either as previously prescribed as 120 mg. She  states that she has been pill bottle if her heart rate increases. 2. Hypertension-mildly elevated today. Encouraged low sodium diet. Exercise. Medication. 3. Hyperlipidemia-currently taking fish oil. LDL cholesterol was 211 on 09/30/12. Strongly encouraged statin therapy. Not interested currently. Likely familial hyperlipidemia. 4. We will see back in 6 months.  Signed, Donato Schultz, MD [facility]  11/27/2012 2:19 PM

## 2012-12-10 ENCOUNTER — Other Ambulatory Visit: Payer: Self-pay

## 2012-12-10 MED ORDER — FLECAINIDE ACETATE 50 MG PO TABS: ORAL_TABLET | ORAL | Status: DC

## 2013-03-12 ENCOUNTER — Other Ambulatory Visit: Payer: Self-pay | Admitting: Gastroenterology

## 2013-03-12 DIAGNOSIS — R110 Nausea: Secondary | ICD-10-CM

## 2013-03-17 ENCOUNTER — Ambulatory Visit
Admission: RE | Admit: 2013-03-17 | Discharge: 2013-03-17 | Disposition: A | Discharge status: Home / Self Care | Payer: Medicare Other | Source: Ambulatory Visit | Attending: Gastroenterology | Admitting: Gastroenterology

## 2013-03-17 DIAGNOSIS — R110 Nausea: Secondary | ICD-10-CM

## 2013-07-04 ENCOUNTER — Other Ambulatory Visit: Payer: Self-pay | Admitting: Neurosurgery

## 2013-07-28 ENCOUNTER — Encounter (HOSPITAL_COMMUNITY): Payer: Self-pay

## 2013-07-29 ENCOUNTER — Encounter (HOSPITAL_COMMUNITY): Payer: Self-pay

## 2013-07-29 ENCOUNTER — Encounter (HOSPITAL_COMMUNITY)
Admission: RE | Admit: 2013-07-29 | Discharge: 2013-07-29 | Disposition: A | Discharge status: Home / Self Care | Payer: Medicare Other | Source: Ambulatory Visit | Attending: Anesthesiology | Admitting: Anesthesiology

## 2013-07-29 ENCOUNTER — Encounter (HOSPITAL_COMMUNITY)
Admission: RE | Admit: 2013-07-29 | Discharge: 2013-07-29 | Disposition: A | Discharge status: Home / Self Care | Payer: Medicare Other | Source: Ambulatory Visit | Attending: Neurosurgery | Admitting: Neurosurgery

## 2013-07-29 DIAGNOSIS — Z01810 Encounter for preprocedural cardiovascular examination: Secondary | ICD-10-CM | POA: Insufficient documentation

## 2013-07-29 DIAGNOSIS — Z01812 Encounter for preprocedural laboratory examination: Secondary | ICD-10-CM | POA: Insufficient documentation

## 2013-07-29 DIAGNOSIS — Z01818 Encounter for other preprocedural examination: Secondary | ICD-10-CM | POA: Insufficient documentation

## 2013-07-29 HISTORY — DX: Gastro-esophageal reflux disease without esophagitis: K21.9

## 2013-07-29 HISTORY — DX: Unspecified osteoarthritis, unspecified site: M19.90

## 2013-07-29 HISTORY — DX: Cardiac arrhythmia, unspecified: I49.9

## 2013-07-29 HISTORY — DX: Pneumonia, unspecified organism: J18.9

## 2013-07-29 HISTORY — DX: Headache: R51

## 2013-07-29 HISTORY — DX: Other complications of anesthesia, initial encounter: T88.59XA

## 2013-07-29 HISTORY — DX: Adverse effect of unspecified anesthetic, initial encounter: T41.45XA

## 2013-07-29 HISTORY — DX: Personal history of other diseases of the digestive system: Z87.19

## 2013-07-29 LAB — BASIC METABOLIC PANEL
BUN: 18 mg/dL (ref 6–23)
CO2: 28 mEq/L (ref 19–32)
Calcium: 10.4 mg/dL (ref 8.4–10.5)
Chloride: 99 mEq/L (ref 96–112)
Creatinine, Ser: 0.92 mg/dL (ref 0.50–1.10)
GFR calc Af Amer: 68 mL/min — ABNORMAL LOW (ref >90)
GFR calc non Af Amer: 59 mL/min — ABNORMAL LOW (ref >90)
GLUCOSE: 96 mg/dL (ref 70–99)
POTASSIUM: 4.5 meq/L (ref 3.7–5.3)
Sodium: 140 mEq/L (ref 137–147)

## 2013-07-29 LAB — CBC
HCT: 37.1 % (ref 36.0–46.0)
HEMOGLOBIN: 12.8 g/dL (ref 12.0–15.0)
MCH: 32.8 pg (ref 26.0–34.0)
MCHC: 34.5 g/dL (ref 30.0–36.0)
MCV: 95.1 fL (ref 78.0–100.0)
Platelets: 263 10*3/uL (ref 150–400)
RBC: 3.9 MIL/uL (ref 3.87–5.11)
RDW: 12.3 % (ref 11.5–15.5)
WBC: 7.2 10*3/uL (ref 4.0–10.5)

## 2013-07-29 LAB — ABO/RH: ABO/RH(D): A POS

## 2013-07-29 LAB — SURGICAL PCR SCREEN
MRSA, PCR: NEGATIVE
Staphylococcus aureus: NEGATIVE

## 2013-07-29 NOTE — Pre-Procedure Instructions (Signed)
Cheryl [name]  07/29/2013   Your procedure is scheduled on: Thursday, August 07, 2013  [facility] Admitting at 9:00 AM ( per MD)  Call this number if you have problems the morning of surgery: [phone]   Remember:   Do not eat food or drink liquids after midnight.   Take these medicines the morning of surgery with A SIP OF WATER: if needed:loratadine (CLARITIN)  for allergies Stop taking Aspirin, vitamins and herbal medications. Do not take any NSAIDs ie: Ibuprofen, Advil, Naproxen or any medication containing Aspirin; stop 5 days prior to procedure (Sat. 08/02/13).  Do not wear jewelry, make-up or nail polish.  Do not wear lotions, powders, or perfumes. You may wear deodorant.  Do not shave 48 hours prior to surgery. Men may shave face and neck.  Do not bring valuables to the hospital.  [facility] is not responsible for any belongings or valuables.               Contacts, dentures or bridgework may not be worn into surgery.  Leave suitcase in the car. After surgery it may be brought to your room.  For patients admitted to the hospital, discharge time is determined by your treatment team.               Patients discharged the day of surgery will not be allowed to drive home.  Name and phone number of your driver:   Special Instructions:  Special Instructions:Special Instructions: [facility] - Preparing for Surgery  Before surgery, you can play an important role.  Because skin is not sterile, your skin needs to be as free of germs as possible.  You can reduce the number of germs on you skin by washing with CHG (chlorahexidine gluconate) soap before surgery.  CHG is an antiseptic cleaner which kills germs and bonds with the skin to continue killing germs even after washing.  Please DO NOT use if you have an allergy to CHG or antibacterial soaps.  If your skin becomes reddened/irritated stop using the CHG and inform your nurse when you arrive at Short Stay.  Do not  shave (including legs and underarms) for at least 48 hours prior to the first CHG shower.  You may shave your face.  Please follow these instructions carefully:   1.  Shower with CHG Soap the night before surgery and the morning of Surgery.  2.  If you choose to wash your hair, wash your hair first as usual with your normal shampoo.  3.  After you shampoo, rinse your hair and body thoroughly to remove the Shampoo.  4.  Use CHG as you would any other liquid soap.  You can apply chg directly  to the skin and wash gently with scrungie or a clean washcloth.  5.  Apply the CHG Soap to your body ONLY FROM THE NECK DOWN.  Do not use on open wounds or open sores.  Avoid contact with your eyes, ears, mouth and genitals (private parts).  Wash genitals (private parts) with your normal soap.  6.  Wash thoroughly, paying special attention to the area where your surgery will be performed.  7.  Thoroughly rinse your body with warm water from the neck down.  8.  DO NOT shower/wash with your normal soap after using and rinsing off the CHG Soap.  9.  Pat yourself dry with a clean towel.            10.  Wear clean pajamas.            11.  Place clean sheets on your bed the night of your first shower and do not sleep with pets.  Day of Surgery  Do not apply any lotions the morning of surgery.  Please wear clean clothes to the hospital/surgery center.   Please read over the following fact sheets that you were given: Pain Booklet, Coughing and Deep Breathing, Blood Transfusion Information, MRSA Information and Surgical Site Infection Prevention

## 2013-07-30 NOTE — Progress Notes (Signed)
Anesthesia chart review:  Patient is a 79 year old female scheduled for L3-4, L4-5 laminectomies with PLIF on 08/07/13 by Dr. Lovell SheehanJenkins.  History includes nonsmoker, hypertension, hyperlipidemia, paroxysmal atrial fibrillation (on flecainide), mild CAD by 2004 cath with normal stress test 2012, LVH, diastolic dysfunction, syncope [id number] (flecainide dose decreased afterwards), GERD, hiatal hernia, sinus headaches, back surgery x2, appendectomy, slow to awaken from anesthesia.  BMI is 30.71 consistent with mild obesity.  PCP is Dr. Laurann Montanaynthia White.  Cardiologist is Dr. Donato SchultzMark Skains, last visit 11/27/12 (prior visits were with Dr. Elease HashimotoNahser). She had no known recurrent afib and no chest pain at her last visit. She has refused anticoagulation in the past for afib.   EKG on 07/29/13 showed: NSR, left anterior fascicular block, minimal voltage criteria for LVH. No significant change since last tracing.  Nuclear stress test on 08/30/10 was normal, EF 67%.  Echo on 08/30/10 showed:  - Left ventricle: The cavity size was normal. Wall thickness was increased in a pattern of moderate LVH. Systolic function was normal. The estimated ejection fraction was in the range of 55% to 65%. Wall motion was normal; there were no regional wall motion abnormalities. Doppler parameters are consistent with abnormal left ventricular relaxation (grade 1 diastolic dysfunction). - Mitral valve: There was systolic anterior motion. Mild regurgitation. - Atrial septum: No defect or patent foramen ovale was identified. - Pulmonary arteries: PA peak pressure: 32mm Hg (S).  Cardiac cath in 07/28/02 showed: LM with moderate irregularities, 30-40% proximal LAD, distal LAD with moderate irregularity interburst mild disease of 30%. Left circumflex with moderate irregularities with 20-30%. 40-50% proximal RCA with the remainder the vessel with mild irregularities. Normal in systolic and end-diastolic dimensions. Overall left ventricular function is  well-preserved, EF 55%. No mitral regurgitation.  Preoperative labs noted.    Patient with PAF history but remains on flecainide.  She had only mild CAD by previous cath in 2004 with normal stress test and EF within the past three years.  She was evaluated by cardiology within the past year with no new testing ordered.  If no acute changes then I would anticipate that she could proceed as planned.  Anesthesiologist Dr. Katrinka BlazingSmith agrees with this plan.  Velna Ochsllison Zelenak, PA-C [facility]/Anesthesiology Phone [phone] 07/30/2013 5:25 PM

## 2013-08-06 MED ORDER — CEFAZOLIN SODIUM-DEXTROSE 2-3 GM-% IV SOLR
2.0000 g | INTRAVENOUS | Status: AC
  Administered 2013-08-07 (×2): 2 g via INTRAVENOUS

## 2013-08-07 ENCOUNTER — Inpatient Hospital Stay (HOSPITAL_COMMUNITY): Payer: Medicare Other

## 2013-08-07 ENCOUNTER — Encounter (HOSPITAL_COMMUNITY): Payer: Self-pay | Admitting: Anesthesiology

## 2013-08-07 ENCOUNTER — Encounter (HOSPITAL_COMMUNITY): Payer: Medicare Other | Admitting: Vascular Surgery

## 2013-08-07 ENCOUNTER — Encounter (HOSPITAL_COMMUNITY)
Admission: RE | Disposition: A | Discharge status: Home / Self Care | Payer: Medicare Other | Source: Ambulatory Visit | Attending: Neurosurgery

## 2013-08-07 ENCOUNTER — Inpatient Hospital Stay (HOSPITAL_COMMUNITY): Payer: Medicare Other | Admitting: Anesthesiology

## 2013-08-07 ENCOUNTER — Inpatient Hospital Stay (HOSPITAL_COMMUNITY)
Admission: RE | Admit: 2013-08-07 | Discharge: 2013-08-11 | DRG: 460 | Disposition: A | Discharge status: Home / Self Care | Payer: Medicare Other | Source: Ambulatory Visit | Attending: Neurosurgery | Admitting: Neurosurgery

## 2013-08-07 DIAGNOSIS — Q762 Congenital spondylolisthesis: Principal | ICD-10-CM

## 2013-08-07 DIAGNOSIS — Z882 Allergy status to sulfonamides status: Secondary | ICD-10-CM

## 2013-08-07 DIAGNOSIS — Z9089 Acquired absence of other organs: Secondary | ICD-10-CM

## 2013-08-07 DIAGNOSIS — Z9851 Tubal ligation status: Secondary | ICD-10-CM

## 2013-08-07 DIAGNOSIS — E669 Obesity, unspecified: Secondary | ICD-10-CM | POA: Diagnosis present

## 2013-08-07 DIAGNOSIS — M5137 Other intervertebral disc degeneration, lumbosacral region: Secondary | ICD-10-CM | POA: Diagnosis present

## 2013-08-07 DIAGNOSIS — Z7982 Long term (current) use of aspirin: Secondary | ICD-10-CM

## 2013-08-07 DIAGNOSIS — M4316 Spondylolisthesis, lumbar region: Secondary | ICD-10-CM | POA: Diagnosis present

## 2013-08-07 DIAGNOSIS — I4891 Unspecified atrial fibrillation: Secondary | ICD-10-CM | POA: Diagnosis present

## 2013-08-07 DIAGNOSIS — I2510 Atherosclerotic heart disease of native coronary artery without angina pectoris: Secondary | ICD-10-CM | POA: Diagnosis present

## 2013-08-07 DIAGNOSIS — I10 Essential (primary) hypertension: Secondary | ICD-10-CM | POA: Diagnosis present

## 2013-08-07 DIAGNOSIS — Z6829 Body mass index (BMI) 29.0-29.9, adult: Secondary | ICD-10-CM

## 2013-08-07 DIAGNOSIS — M4120 Other idiopathic scoliosis, site unspecified: Secondary | ICD-10-CM | POA: Diagnosis present

## 2013-08-07 DIAGNOSIS — M48062 Spinal stenosis, lumbar region with neurogenic claudication: Secondary | ICD-10-CM | POA: Diagnosis present

## 2013-08-07 DIAGNOSIS — Z801 Family history of malignant neoplasm of trachea, bronchus and lung: Secondary | ICD-10-CM

## 2013-08-07 DIAGNOSIS — M51379 Other intervertebral disc degeneration, lumbosacral region without mention of lumbar back pain or lower extremity pain: Secondary | ICD-10-CM | POA: Diagnosis present

## 2013-08-07 DIAGNOSIS — E785 Hyperlipidemia, unspecified: Secondary | ICD-10-CM | POA: Diagnosis present

## 2013-08-07 DIAGNOSIS — Z79899 Other long term (current) drug therapy: Secondary | ICD-10-CM

## 2013-08-07 DIAGNOSIS — Z888 Allergy status to other drugs, medicaments and biological substances status: Secondary | ICD-10-CM

## 2013-08-07 DIAGNOSIS — K219 Gastro-esophageal reflux disease without esophagitis: Secondary | ICD-10-CM | POA: Diagnosis present

## 2013-08-07 LAB — TYPE AND SCREEN
ABO/RH(D): A POS
ABO/RH(D): A POS
ANTIBODY SCREEN: NEGATIVE
ANTIBODY SCREEN: NEGATIVE

## 2013-08-07 SURGERY — POSTERIOR LUMBAR FUSION 2 LEVEL
Anesthesia: General | Site: Back

## 2013-08-07 MED ORDER — CEFAZOLIN SODIUM-DEXTROSE 2-3 GM-% IV SOLR
INTRAVENOUS | Status: AC
  Filled 2013-08-07: qty 50

## 2013-08-07 MED ORDER — DIAZEPAM 5 MG PO TABS
5.0000 mg | ORAL_TABLET | Freq: Four times a day (QID) | ORAL | Status: DC | PRN
  Administered 2013-08-07 – 2013-08-10 (×4): 5 mg via ORAL
  Filled 2013-08-07 (×4): qty 1

## 2013-08-07 MED ORDER — CEFAZOLIN SODIUM-DEXTROSE 2-3 GM-% IV SOLR
2.0000 g | Freq: Three times a day (TID) | INTRAVENOUS | Status: AC
  Administered 2013-08-07 – 2013-08-08 (×2): 2 g via INTRAVENOUS
  Filled 2013-08-07 (×2): qty 50

## 2013-08-07 MED ORDER — MIDAZOLAM HCL 2 MG/2ML IJ SOLN
INTRAMUSCULAR | Status: AC
  Filled 2013-08-07: qty 2

## 2013-08-07 MED ORDER — ACETAMINOPHEN 650 MG RE SUPP: 650.0000 mg | RECTAL | Status: DC | PRN

## 2013-08-07 MED ORDER — GLYCOPYRROLATE 0.2 MG/ML IJ SOLN
INTRAMUSCULAR | Status: AC
  Filled 2013-08-07: qty 1

## 2013-08-07 MED ORDER — OXYCODONE-ACETAMINOPHEN 5-325 MG PO TABS
1.0000 | ORAL_TABLET | ORAL | Status: DC | PRN
  Administered 2013-08-07 – 2013-08-08 (×4): 2 via ORAL
  Administered 2013-08-08 – 2013-08-11 (×3): 1 via ORAL
  Administered 2013-08-11: 2 via ORAL
  Filled 2013-08-07 (×2): qty 1
  Filled 2013-08-07: qty 2
  Filled 2013-08-07: qty 1
  Filled 2013-08-07 (×2): qty 2
  Filled 2013-08-07: qty 1
  Filled 2013-08-07: qty 2
  Filled 2013-08-07: qty 1

## 2013-08-07 MED ORDER — PROPOFOL 10 MG/ML IV BOLUS
INTRAVENOUS | Status: AC
  Filled 2013-08-07: qty 20

## 2013-08-07 MED ORDER — ONDANSETRON HCL 4 MG/2ML IJ SOLN: 4.0000 mg | INTRAMUSCULAR | Status: DC | PRN

## 2013-08-07 MED ORDER — FENTANYL CITRATE 0.05 MG/ML IJ SOLN
INTRAMUSCULAR | Status: AC
  Filled 2013-08-07: qty 2

## 2013-08-07 MED ORDER — ONDANSETRON HCL 4 MG/2ML IJ SOLN
INTRAMUSCULAR | Status: DC | PRN
Start: 2013-08-07 — End: 2013-08-07
  Administered 2013-08-07: 4 mg via INTRAVENOUS

## 2013-08-07 MED ORDER — MENTHOL 3 MG MT LOZG: 1.0000 | LOZENGE | OROMUCOSAL | Status: DC | PRN

## 2013-08-07 MED ORDER — PHENOL 1.4 % MT LIQD: 1.0000 | OROMUCOSAL | Status: DC | PRN

## 2013-08-07 MED ORDER — THROMBIN 20000 UNITS EX SOLR
CUTANEOUS | Status: DC | PRN
  Administered 2013-08-07: 13:00:00 via TOPICAL

## 2013-08-07 MED ORDER — DEXTROSE 5 % IV SOLN
10.0000 mg | INTRAVENOUS | Status: DC | PRN
  Administered 2013-08-07: 10 ug/min via INTRAVENOUS

## 2013-08-07 MED ORDER — FENTANYL CITRATE 0.05 MG/ML IJ SOLN
INTRAMUSCULAR | Status: DC | PRN
  Administered 2013-08-07 (×5): 50 ug via INTRAVENOUS

## 2013-08-07 MED ORDER — ROCURONIUM BROMIDE 50 MG/5ML IV SOLN
INTRAVENOUS | Status: AC
  Filled 2013-08-07: qty 1

## 2013-08-07 MED ORDER — HYDROCODONE-ACETAMINOPHEN 5-325 MG PO TABS
1.0000 | ORAL_TABLET | ORAL | Status: DC | PRN
  Administered 2013-08-09 – 2013-08-10 (×4): 1 via ORAL
  Administered 2013-08-10 (×3): 2 via ORAL
  Filled 2013-08-07: qty 1
  Filled 2013-08-07: qty 2
  Filled 2013-08-07: qty 1
  Filled 2013-08-07: qty 2
  Filled 2013-08-07: qty 1
  Filled 2013-08-07: qty 2
  Filled 2013-08-07 (×2): qty 1

## 2013-08-07 MED ORDER — LACTATED RINGERS IV SOLN
INTRAVENOUS | Status: DC | PRN
  Administered 2013-08-07 (×3): via INTRAVENOUS

## 2013-08-07 MED ORDER — BUPIVACAINE-EPINEPHRINE (PF) 0.5% -1:200000 IJ SOLN
INTRAMUSCULAR | Status: DC | PRN
  Administered 2013-08-07: 20 mL
  Administered 2013-08-07: 10 mL via PERINEURAL

## 2013-08-07 MED ORDER — FENTANYL CITRATE 0.05 MG/ML IJ SOLN
INTRAMUSCULAR | Status: AC
  Filled 2013-08-07: qty 5

## 2013-08-07 MED ORDER — 0.9 % SODIUM CHLORIDE (POUR BTL) OPTIME
TOPICAL | Status: DC | PRN
  Administered 2013-08-07: 1000 mL

## 2013-08-07 MED ORDER — PROPOFOL 10 MG/ML IV BOLUS
INTRAVENOUS | Status: DC | PRN
  Administered 2013-08-07: 140 mg via INTRAVENOUS

## 2013-08-07 MED ORDER — BACITRACIN ZINC 500 UNIT/GM EX OINT
TOPICAL_OINTMENT | CUTANEOUS | Status: DC | PRN
  Administered 2013-08-07: 1 via TOPICAL

## 2013-08-07 MED ORDER — NEOSTIGMINE METHYLSULFATE 10 MG/10ML IV SOLN
INTRAVENOUS | Status: AC
  Filled 2013-08-07: qty 1

## 2013-08-07 MED ORDER — NEOSTIGMINE METHYLSULFATE 10 MG/10ML IV SOLN
INTRAVENOUS | Status: DC | PRN
  Administered 2013-08-07: 5 mg via INTRAVENOUS

## 2013-08-07 MED ORDER — ARTIFICIAL TEARS OP OINT
TOPICAL_OINTMENT | OPHTHALMIC | Status: AC
  Filled 2013-08-07: qty 3.5

## 2013-08-07 MED ORDER — GLYCOPYRROLATE 0.2 MG/ML IJ SOLN
INTRAMUSCULAR | Status: DC | PRN
  Administered 2013-08-07: .8 mg via INTRAVENOUS
  Administered 2013-08-07: 0.2 mg via INTRAVENOUS

## 2013-08-07 MED ORDER — FLECAINIDE ACETATE 50 MG PO TABS
50.0000 mg | ORAL_TABLET | Freq: Every evening | ORAL | Status: DC
  Administered 2013-08-07 – 2013-08-10 (×4): 50 mg via ORAL
  Filled 2013-08-07 (×5): qty 1

## 2013-08-07 MED ORDER — LORATADINE 10 MG PO TABS: 10.0000 mg | ORAL_TABLET | Freq: Every day | ORAL | Status: DC | PRN

## 2013-08-07 MED ORDER — DIAZEPAM 5 MG PO TABS
ORAL_TABLET | ORAL | Status: AC
  Filled 2013-08-07: qty 1

## 2013-08-07 MED ORDER — TRAZODONE HCL 100 MG PO TABS
100.0000 mg | ORAL_TABLET | Freq: Every day | ORAL | Status: DC
  Administered 2013-08-07 – 2013-08-10 (×4): 100 mg via ORAL
  Filled 2013-08-07 (×5): qty 1

## 2013-08-07 MED ORDER — ACETAMINOPHEN 325 MG PO TABS
650.0000 mg | ORAL_TABLET | ORAL | Status: DC | PRN
Start: 2013-08-07 — End: 2013-08-11
  Administered 2013-08-09: 650 mg via ORAL
  Filled 2013-08-07: qty 2

## 2013-08-07 MED ORDER — LACTATED RINGERS IV SOLN
INTRAVENOUS | Status: DC
  Administered 2013-08-08: 03:00:00 via INTRAVENOUS

## 2013-08-07 MED ORDER — MORPHINE SULFATE 2 MG/ML IJ SOLN
1.0000 mg | INTRAMUSCULAR | Status: DC | PRN
  Administered 2013-08-07: 2 mg via INTRAVENOUS
  Filled 2013-08-07: qty 1

## 2013-08-07 MED ORDER — SODIUM CHLORIDE 0.9 % IR SOLN
Status: DC | PRN
  Administered 2013-08-07: 13:00:00

## 2013-08-07 MED ORDER — LACTATED RINGERS IV SOLN
INTRAVENOUS | Status: DC
  Administered 2013-08-07: 10:00:00 via INTRAVENOUS

## 2013-08-07 MED ORDER — PHENYLEPHRINE 40 MCG/ML (10ML) SYRINGE FOR IV PUSH (FOR BLOOD PRESSURE SUPPORT)
PREFILLED_SYRINGE | INTRAVENOUS | Status: AC
  Filled 2013-08-07: qty 10

## 2013-08-07 MED ORDER — FENTANYL CITRATE 0.05 MG/ML IJ SOLN
25.0000 ug | INTRAMUSCULAR | Status: DC | PRN
  Administered 2013-08-07: 50 ug via INTRAVENOUS
  Administered 2013-08-07 (×2): 25 ug via INTRAVENOUS

## 2013-08-07 MED ORDER — EPHEDRINE SULFATE 50 MG/ML IJ SOLN
INTRAMUSCULAR | Status: AC
  Filled 2013-08-07: qty 1

## 2013-08-07 MED ORDER — ALUM & MAG HYDROXIDE-SIMETH 200-200-20 MG/5ML PO SUSP: 30.0000 mL | Freq: Four times a day (QID) | ORAL | Status: DC | PRN

## 2013-08-07 MED ORDER — ALBUMIN HUMAN 5 % IV SOLN
INTRAVENOUS | Status: DC | PRN
  Administered 2013-08-07: 15:00:00 via INTRAVENOUS

## 2013-08-07 MED ORDER — GLYCOPYRROLATE 0.2 MG/ML IJ SOLN
INTRAMUSCULAR | Status: AC
  Filled 2013-08-07: qty 3

## 2013-08-07 MED ORDER — ARTIFICIAL TEARS OP OINT
TOPICAL_OINTMENT | OPHTHALMIC | Status: DC | PRN
  Administered 2013-08-07: 1 via OPHTHALMIC

## 2013-08-07 MED ORDER — ROCURONIUM BROMIDE 100 MG/10ML IV SOLN
INTRAVENOUS | Status: DC | PRN
  Administered 2013-08-07: 40 mg via INTRAVENOUS
  Administered 2013-08-07 (×4): 10 mg via INTRAVENOUS

## 2013-08-07 MED ORDER — ONDANSETRON HCL 4 MG/2ML IJ SOLN
INTRAMUSCULAR | Status: AC
  Filled 2013-08-07: qty 2

## 2013-08-07 MED ORDER — MIDAZOLAM HCL 5 MG/5ML IJ SOLN
INTRAMUSCULAR | Status: DC | PRN
  Administered 2013-08-07: 1 mg via INTRAVENOUS

## 2013-08-07 MED ORDER — LIDOCAINE HCL (CARDIAC) 20 MG/ML IV SOLN
INTRAVENOUS | Status: DC | PRN
  Administered 2013-08-07: 70 mg via INTRAVENOUS

## 2013-08-07 MED ORDER — STERILE WATER FOR INJECTION IJ SOLN
INTRAMUSCULAR | Status: AC
  Filled 2013-08-07: qty 10

## 2013-08-07 MED ORDER — ADULT MULTIVITAMIN W/MINERALS CH
1.0000 | ORAL_TABLET | Freq: Every day | ORAL | Status: DC
  Administered 2013-08-08 – 2013-08-11 (×4): 1 via ORAL
  Filled 2013-08-07 (×5): qty 1

## 2013-08-07 MED ORDER — SUCCINYLCHOLINE CHLORIDE 20 MG/ML IJ SOLN
INTRAMUSCULAR | Status: AC
  Filled 2013-08-07: qty 1

## 2013-08-07 MED ORDER — DOCUSATE SODIUM 100 MG PO CAPS
100.0000 mg | ORAL_CAPSULE | Freq: Two times a day (BID) | ORAL | Status: DC
Start: 2013-08-07 — End: 2013-08-11
  Administered 2013-08-07 – 2013-08-11 (×8): 100 mg via ORAL
  Filled 2013-08-07 (×9): qty 1

## 2013-08-07 SURGICAL SUPPLY — 75 items
APL SKNCLS STERI-STRIP NONHPOA (GAUZE/BANDAGES/DRESSINGS) ×1
BAG DECANTER FOR FLEXI CONT (MISCELLANEOUS) ×2 IMPLANT
BENZOIN TINCTURE PRP APPL 2/3 (GAUZE/BANDAGES/DRESSINGS) ×2 IMPLANT
BLADE 10 SAFETY STRL DISP (BLADE) ×1 IMPLANT
BLADE SURG ROTATE 9660 (MISCELLANEOUS) IMPLANT
BRUSH SCRUB EZ PLAIN DRY (MISCELLANEOUS) ×2 IMPLANT
BUR ACORN 6.0 (BURR) ×2 IMPLANT
BUR MATCHSTICK NEURO 3.0 LAGG (BURR) ×2 IMPLANT
CANISTER SUCT 3000ML (MISCELLANEOUS) ×2 IMPLANT
CAP REVERE LOCKING (Cap) ×6 IMPLANT
CONN CROSSLINK REV 38-50MM (Connector) ×2 IMPLANT
CONNECTOR CRSLINK REV 38-50MM (Connector) IMPLANT
CONT SPEC 4OZ CLIKSEAL STRL BL (MISCELLANEOUS) ×2 IMPLANT
COVER BACK TABLE 24X17X13 BIG (DRAPES) IMPLANT
COVER TABLE BACK 60X90 (DRAPES) ×2 IMPLANT
DRAPE C-ARM 42X72 X-RAY (DRAPES) ×4 IMPLANT
DRAPE LAPAROTOMY 100X72X124 (DRAPES) ×2 IMPLANT
DRAPE POUCH INSTRU U-SHP 10X18 (DRAPES) ×2 IMPLANT
DRAPE PROXIMA HALF (DRAPES) ×2 IMPLANT
DRAPE SURG 17X23 STRL (DRAPES) ×8 IMPLANT
ELECT BLADE 4.0 EZ CLEAN MEGAD (MISCELLANEOUS) ×2
ELECT REM PT RETURN 9FT ADLT (ELECTROSURGICAL) ×2
ELECTRODE BLDE 4.0 EZ CLN MEGD (MISCELLANEOUS) ×1 IMPLANT
ELECTRODE REM PT RTRN 9FT ADLT (ELECTROSURGICAL) ×1 IMPLANT
EVACUATOR 1/8 PVC DRAIN (DRAIN) ×1 IMPLANT
EVACUATOR 3/16  PVC DRAIN (DRAIN) ×1
EVACUATOR 3/16 PVC DRAIN (DRAIN) IMPLANT
GAUZE SPONGE 4X4 16PLY XRAY LF (GAUZE/BANDAGES/DRESSINGS) ×2 IMPLANT
GLOVE BIO SURGEON STRL SZ8.5 (GLOVE) ×4 IMPLANT
GLOVE BIOGEL PI IND STRL 7.5 (GLOVE) IMPLANT
GLOVE BIOGEL PI INDICATOR 7.5 (GLOVE) ×1
GLOVE ECLIPSE 8.0 STRL XLNG CF (GLOVE) ×1 IMPLANT
GLOVE EXAM NITRILE LRG STRL (GLOVE) IMPLANT
GLOVE EXAM NITRILE MD LF STRL (GLOVE) IMPLANT
GLOVE EXAM NITRILE XL STR (GLOVE) IMPLANT
GLOVE EXAM NITRILE XS STR PU (GLOVE) IMPLANT
GLOVE SS BIOGEL STRL SZ 8 (GLOVE) ×2 IMPLANT
GLOVE SS N UNI LF 7.0 STRL (GLOVE) ×4 IMPLANT
GLOVE SUPERSENSE BIOGEL SZ 8 (GLOVE) ×2
GOWN STRL REUS W/ TWL LRG LVL3 (GOWN DISPOSABLE) IMPLANT
GOWN STRL REUS W/ TWL XL LVL3 (GOWN DISPOSABLE) ×2 IMPLANT
GOWN STRL REUS W/TWL 2XL LVL3 (GOWN DISPOSABLE) IMPLANT
GOWN STRL REUS W/TWL LRG LVL3 (GOWN DISPOSABLE)
GOWN STRL REUS W/TWL XL LVL3 (GOWN DISPOSABLE) ×6
KIT BASIN OR (CUSTOM PROCEDURE TRAY) ×2 IMPLANT
KIT ROOM TURNOVER OR (KITS) ×2 IMPLANT
NDL HYPO 21X1.5 SAFETY (NEEDLE) IMPLANT
NEEDLE HYPO 21X1.5 SAFETY (NEEDLE) IMPLANT
NEEDLE HYPO 22GX1.5 SAFETY (NEEDLE) ×2 IMPLANT
NS IRRIG 1000ML POUR BTL (IV SOLUTION) ×2 IMPLANT
PACK FOAM VITOSS 10CC (Orthopedic Implant) ×1 IMPLANT
PACK LAMINECTOMY NEURO (CUSTOM PROCEDURE TRAY) ×2 IMPLANT
PAD ARMBOARD 7.5X6 YLW CONV (MISCELLANEOUS) ×6 IMPLANT
PATTIES SURGICAL .5 X1 (DISPOSABLE) IMPLANT
PUTTY 10ML ACTIFUSE ABX (Putty) ×1 IMPLANT
PUTTY 5ML ACTIFUSE ABX (Putty) ×1 IMPLANT
ROD REVERE 6.35 CURVED 85MM (Rod) ×1 IMPLANT
ROD REVERE 7.5MM (Rod) ×1 IMPLANT
SCREW 7.5X50MM (Screw) ×6 IMPLANT
SPACER SUSTAIN O 10X10X26 (Screw) ×4 IMPLANT
SPONGE GAUZE 4X4 12PLY (GAUZE/BANDAGES/DRESSINGS) ×2 IMPLANT
SPONGE LAP 4X18 X RAY DECT (DISPOSABLE) IMPLANT
SPONGE NEURO XRAY DETECT 1X3 (DISPOSABLE) IMPLANT
SPONGE SURGIFOAM ABS GEL 100 (HEMOSTASIS) ×2 IMPLANT
STRIP CLOSURE SKIN 1/2X4 (GAUZE/BANDAGES/DRESSINGS) ×2 IMPLANT
SUT VIC AB 1 CT1 18XBRD ANBCTR (SUTURE) ×2 IMPLANT
SUT VIC AB 1 CT1 8-18 (SUTURE) ×4
SUT VIC AB 2-0 CP2 18 (SUTURE) ×4 IMPLANT
SYR 20CC LL (SYRINGE) IMPLANT
SYR 20ML ECCENTRIC (SYRINGE) ×2 IMPLANT
TAPE CLOTH SURG 4X10 WHT LF (GAUZE/BANDAGES/DRESSINGS) ×1 IMPLANT
TOWEL OR 17X24 6PK STRL BLUE (TOWEL DISPOSABLE) ×2 IMPLANT
TOWEL OR 17X26 10 PK STRL BLUE (TOWEL DISPOSABLE) ×2 IMPLANT
TRAY FOLEY CATH 14FRSI W/METER (CATHETERS) ×2 IMPLANT
WATER STERILE IRR 1000ML POUR (IV SOLUTION) ×2 IMPLANT

## 2013-08-07 NOTE — Anesthesia Postprocedure Evaluation (Signed)
Anesthesia Post Note  Patient: Cheryl [name]  Procedure(s) Performed: Procedure(s) (LRB): POSTERIOR LUMBAR FUSION 2 LEVEL (N/A)  Anesthesia type: general  Patient location: PACU  Post pain: Pain level controlled  Post assessment: Patient's Cardiovascular Status Stable  Last Vitals:  Filed Vitals:   08/07/13 1752  BP:   Pulse: 53  Temp: 36.4 C  Resp: 16    Post vital signs: Reviewed and stable  Level of consciousness: sedated  Complications: No apparent anesthesia complications

## 2013-08-07 NOTE — Transfer of Care (Signed)
Immediate Anesthesia Transfer of Care Note  Patient: Cheryl [name]  Procedure(s) Performed: Procedure(s) with comments: POSTERIOR LUMBAR FUSION 2 LEVEL (N/A) - Posterior Lumbar Three-Five Laminectomies with Interbody and Fusion with Interbody Prosthesis, Posterior Lateral Arthrodesis, and Posterior Segmental Instrumentation  Patient Location: PACU  Anesthesia Type:General  Level of Consciousness: awake, alert  and oriented  Airway & Oxygen Therapy: Patient Spontanous Breathing and Patient connected to nasal cannula oxygen  Post-op Assessment: Report given to PACU RN and Post -op Vital signs reviewed and stable  Post vital signs: Reviewed and stable  Complications: No apparent anesthesia complications

## 2013-08-07 NOTE — Anesthesia Preprocedure Evaluation (Addendum)
Anesthesia Evaluation  Patient identified by MRN, date of birth, ID band Patient awake    Reviewed: Allergy & Precautions, H&P , NPO status , Patient's Chart, lab work & pertinent test results  History of Anesthesia Complications (+) history of anesthetic complications  Airway Mallampati: II TM Distance: >3 FB Neck ROM: Full    Dental  (+) Teeth Intact, Dental Advisory Given   Pulmonary pneumonia -,  breath sounds clear to auscultation        Cardiovascular hypertension, + CAD + dysrhythmias Atrial Fibrillation Rhythm:Regular     Neuro/Psych  Headaches,    GI/Hepatic Neg liver ROS, hiatal hernia, GERD-  Controlled,  Endo/Other  negative endocrine ROS  Renal/GU negative Renal ROS     Musculoskeletal   Abdominal   Peds  Hematology   Anesthesia Other Findings   Reproductive/Obstetrics                         Anesthesia Physical Anesthesia Plan  ASA: III  Anesthesia Plan: General   Post-op Pain Management:    Induction: Intravenous  Airway Management Planned: Oral ETT  Additional Equipment:   Intra-op Plan:   Post-operative Plan: Extubation in OR  Informed Consent: I have reviewed the patients History and Physical, chart, labs and discussed the procedure including the risks, benefits and alternatives for the proposed anesthesia with the patient or authorized representative who has indicated his/her understanding and acceptance.   Dental advisory given  Plan Discussed with: CRNA, Anesthesiologist and Surgeon  Anesthesia Plan Comments:       Anesthesia Quick Evaluation

## 2013-08-07 NOTE — H&P (Signed)
Subjective: The patient is a 79 year old white female who has complained of back and leg pain consistent neurogenic claudication. She has failed medical management and was worked up with a lumbar x-rays a lumbar MRI. This demonstrated L3-4 and L4-5 spondylolisthesis, spinal stenosis, et Karie Sodacetera. She has had previous surgery at L1-2. I discussed the various treatment option with the patient including surgery. She has weighed the risks, benefits, and alternatives surgery and decided proceed with the L3-4 and L4-5 decompression, instrumentation, and fusion.   Past Medical History  Diagnosis Date  . Hypertension   . Hyperlipidemia   . PAF (paroxysmal atrial fibrillation)   . Obesity   . History of heartburn   . High risk medication use     Flecainide therapy  . Syncope July 2012  . Normal nuclear stress test July 2012  . LVH (left ventricular hypertrophy)     Per echo in July 2012  . Diastolic dysfunction     Grade I, per echo in 2012  . CAD (coronary artery disease)     Mild per remote cath in 2004; normal nuclear in 2012  . Complication of anesthesia     slow to wake up  . Dysrhythmia   . Pneumonia     20 yrs ago  . GERD (gastroesophageal reflux disease)     prevacid  . H/O hiatal hernia   . Headache(784.0)     sinus  . Arthritis     Past Surgical History  Procedure Laterality Date  . Cardiac catheterization  07/28/2002    EF GREATER THAN 55%; MILD CAD  . Back surgery  1980, 2010    x2  . Tonsillectomy      age 69 yrs  . Appendectomy  1952  . Tubal ligation    . Dilation and curettage of uterus    . Colonoscopy      Allergies  Allergen Reactions  . Amlodipine Besylate     REACTION: dizzy  . Codeine Phosphate     headache  . Lipitor [Atorvastatin Calcium]     Muscle pain and tiredness  . Sulfonamide Derivatives     REACTION: unspecified    History  Substance Use Topics  . Smoking status: Never Smoker   . Smokeless tobacco: Never Used  . Alcohol Use: No     Family History  Problem Relation Age of Onset  . Lung cancer Father    Prior to Admission medications   Medication Sig Start Date End Date Taking? Authorizing Provider  aspirin EC 81 MG tablet Take 81 mg by mouth daily.   Yes Historical Provider, MD  b complex vitamins capsule Take 1 capsule by mouth daily.   Yes Historical Provider, MD  Cholecalciferol (VITAMIN D) 2000 UNITS tablet Take 2,000 Units by mouth daily.     Yes Historical Provider, MD  flecainide (TAMBOCOR) 50 MG tablet Take 50 mg by mouth every evening.   Yes Historical Provider, MD  ibuprofen (ADVIL,MOTRIN) 200 MG tablet Take 400 mg by mouth 2 (two) times daily.   Yes Historical Provider, MD  loratadine (CLARITIN) 10 MG tablet Take 10 mg by mouth daily as needed for allergies.   Yes Historical Provider, MD  Multiple Vitamin (MULTIVITAMIN WITH MINERALS) TABS tablet Take 1 tablet by mouth daily.   Yes Historical Provider, MD  traZODone (DESYREL) 100 MG tablet Take 100 mg by mouth at bedtime.     Yes Historical Provider, MD     Review of Systems  Positive ROS: As above  All other systems have been reviewed and were otherwise negative with the exception of those mentioned in the HPI and as above.  Objective: Vital signs in last 24 hours: Temp:  [97.9 F (36.6 C)] 97.9 F (36.6 C) (07/02 0912) Pulse Rate:  [74] 74 (07/02 0912) Resp:  [18] 18 (07/02 0912) BP: (150)/(86) 150/86 mmHg (07/02 0913) SpO2:  [98 %] 98 % (07/02 0912) Weight:  [78.472 kg (173 lb)] 78.472 kg (173 lb) (07/02 0913)  General Appearance: Alert, cooperative, no distress, Head: Normocephalic, without obvious abnormality, atraumatic Eyes: PERRL, conjunctiva/corneas clear, EOM's intact,    Ears: Normal  Throat: Normal  Neck: Supple, symmetrical, trachea midline, no adenopathy; thyroid: No enlargement/tenderness/nodules; no carotid bruit or JVD Back: Symmetric, no curvature, ROM normal, no CVA tenderness Lungs: Clear to auscultation bilaterally,  respirations unlabored Heart: Regular rate and rhythm, no murmur, rub or gallop Abdomen: Soft, non-tender,, no masses, no organomegaly Extremities: Extremities normal, atraumatic, no cyanosis or edema Pulses: 2+ and symmetric all extremities Skin: Skin color, texture, turgor normal, no rashes or lesions  NEUROLOGIC:   Mental status: alert and oriented, no aphasia, good attention span, Fund of knowledge/ memory ok Motor Exam - grossly normal Sensory Exam - grossly normal Reflexes:  Coordination - grossly normal Gait - grossly normal Balance - grossly normal Cranial Nerves: I: smell Not tested  II: visual acuity  OS: Normal  OD: Normal   II: visual fields Full to confrontation  II: pupils Equal, round, reactive to light  III,VII: ptosis None  III,IV,VI: extraocular muscles  Full ROM  V: mastication Normal  V: facial light touch sensation  Normal  V,VII: corneal reflex  Present  VII: facial muscle function - upper  Normal  VII: facial muscle function - lower Normal  VIII: hearing Not tested  IX: soft palate elevation  Normal  IX,X: gag reflex Present  XI: trapezius strength  5/5  XI: sternocleidomastoid strength 5/5  XI: neck flexion strength  5/5  XII: tongue strength  Normal    Data Review Lab Results  Component Value Date   WBC 7.2 07/29/2013   HGB 12.8 07/29/2013   HCT 37.1 07/29/2013   MCV 95.1 07/29/2013   PLT 263 07/29/2013   Lab Results  Component Value Date   NA 140 07/29/2013   K 4.5 07/29/2013   CL 99 07/29/2013   CO2 28 07/29/2013   BUN 18 07/29/2013   CREATININE 0.92 07/29/2013   GLUCOSE 96 07/29/2013   Lab Results  Component Value Date   INR 1.0 08/11/2008    Assessment/Plan: L3-4 and L4-5 spondylolisthesis, spinal stenosis, lumbago, lumbar radiculopathy, neurogenic claudication: I discussed situation with the patient. I have reviewed her imaging studies with her and pointed out the abnormalities. We have discussed the various treatment options including  surgery. I described the surgical treatment option of an L3-4 and L4-5 decompression, instrumentation, and fusion. I have shown her surgical models. We have discussed the risks, benefits, alternatives, and likelihood of achieving our goals with surgery. I've answered all the patient's questions. She has decided proceed with surgery.   Cheryl [name] D 08/07/2013 11:47 AM

## 2013-08-07 NOTE — Op Note (Signed)
Brief history: The patient is a 79 year old white female who's had 2 prior back surgeries by another physician. She has developed recurrent back, buttock, and leg pain consistent with neurogenic claudication. She has failed medical management and was worked up with a lumbar MRI and lumbar x-rays. These demonstrated scoliosis, spondylolisthesis, stenosis, etc. at L2-3, L3-4 and L4-5. I discussed the various treatment options with the patient including surgery. She has weighed the risks, benefits, and alternatives surgery and decided proceed with a L3-4 and L4-5 decompression, instrumentation, and fusion.  Preoperative diagnosis: L2-3, L3-4 and L4-5 Degenerative disc disease, spinal stenosis compressing both the L3, L4 and the L5 nerve roots; lumbago; lumbar radiculopathy; L3-4 and L4-5 spondylolisthesis; neurogenic claudication  Postoperative diagnosis: The same  Procedure: L3 and L4 laminectomy with bilateral L2 Laminotomy/foraminotomies to decompress the bilateral L3, L4 and L5 nerve roots(the work required to do this was in addition to the work required to do the posterior lumbar interbody fusion because of the patient's spinal stenosis, facet arthropathy. Etc. requiring a wide decompression of the nerve roots.); L3-4 and L4-5 posterior lumbar interbody fusion with local morselized autograft bone and Actifusebone graft extender; insertion of interbody prosthesis at L3-4 and L4-5 (globus peek interbody prosthesis); posterior segmental instrumentation from L3 to L5 with globus titanium pedicle screws and rods; posterior lateral arthrodesis at L3-4 and L4-5 with local morselized autograft bone and Vitoss bone graft extender.  Surgeon: Dr. Delma OfficerJeff Abdulwahab Demelo  Asst.: Dr. Aliene Beamsandy Kritzer  Anesthesia: Gen. endotracheal  Estimated blood loss: 300 cc  Drains: One medium Hemovac  Complications: None  Description of procedure: The patient was brought to the operating room by the anesthesia team. General  endotracheal anesthesia was induced. The patient was turned to the prone position on the Wilson frame. The patient's lumbosacral region was then prepared with Betadine scrub and Betadine solution. Sterile drapes were applied.  I then injected the area to be incised with Marcaine with epinephrine solution. I then used the scalpel to make a linear midline incision over the L3-4 and L4-5 interspace. I then used electrocautery to perform a bilateral subperiosteal dissection exposing the spinous process and lamina of L2-L5. We then obtained intraoperative radiograph to confirm our location. We then inserted the Verstrac retractor to provide exposure. I incised interspinous ligament at L2-3, L3-4 and L4-5. I used Leksell nodule were to remove the spinous process of L3 and L4.  I began the decompression by using the high speed drill to perform laminotomies at L2, L3 and L4 bilaterally. We then used the Kerrison punches to widen the laminotomy and removed the ligamentum flavum at L2-3, L3-4 and L4-5. We used the Kerrison punches to remove the medial facets at L3-4 and L4-5 and to widen the laminotomy at L2 bilaterally. We performed wide foraminotomies about the bilateral L3, L4 and L5 nerve roots completing the decompression.  We now turned our attention to the posterior lumbar interbody fusion. I used a scalpel to incise the intervertebral disc at L3-4 and L4-5. I then performed a partial intervertebral discectomy at L3-4 and L4-5 using the pituitary forceps. We prepared the vertebral endplates at L3-4 and L4-5 for the fusion by removing the soft tissues with the curettes. We then used the trial spacers to pick the appropriate sized interbody prosthesis. We prefilled his prosthesis with a combination of local morselized autograft bone that we obtained during the decompression as well as Actifuse bone graft extender. We inserted the prefilled prosthesis into the interspace at L3-4 and L4-5. There was  a good snug fit  of the prosthesis in the interspace. We then filled and the remainder of the intervertebral disc space with local morselized autograft bone and Actifuse. This completed the posterior lumbar interbody arthrodesis.  We now turned attention to the instrumentation. Under fluoroscopic guidance we cannulated the bilateral L3, L4 and L5 pedicles with the bone probe. We then removed the bone probe. We then tapped the pedicle with a 6.5 millimeter tap. We then removed the tap. We probed inside the tapped pedicle with a ball probe to rule out cortical breaches. We then inserted a 7.5 x 50 millimeter pedicle screw into the L3, L4 and L5 pedicles bilaterally under fluoroscopic guidance. We then palpated along the medial aspect of the pedicles to rule out cortical breaches. There were none. The nerve roots were not injured. We then connected the unilateral pedicle screws with a lordotic rod. We compressed the construct and secured the rod in place with the caps. We then tightened the caps appropriately. This completed the instrumentation from L3-L5.  We now turned our attention to the posterior lateral arthrodesis at L3-4 and L4-5. We used the high-speed drill to decorticate the remainder of the facets, pars, transverse process at L3-4 and L4-5. We then applied a combination of local morselized autograft bone and Vitoss bone graft extender over these decorticated posterior lateral structures. This completed the posterior lateral arthrodesis.  We then obtained hemostasis using bipolar electrocautery. We irrigated the wound out with bacitracin solution. We inspected the thecal sac and nerve roots and noted they were well decompressed. We then removed the retractor. We placed a medium Hemovac drain in the epidural space and tunneled out through separate stab wound. We reapproximated patient's thoracolumbar fascia with interrupted #1 Vicryl suture. We reapproximated patient's subcutaneous tissue with interrupted 2-0 Vicryl  suture. The reapproximated patient's skin with Steri-Strips and benzoin. The wound was then coated with bacitracin ointment. A sterile dressing was applied. The drapes were removed. The patient was subsequently returned to the supine position where they were extubated by the anesthesia team. He was then transported to the post anesthesia care unit in stable condition. All sponge instrument and needle counts were reportedly correct at the end of this case.

## 2013-08-07 NOTE — Anesthesia Procedure Notes (Signed)
Procedure Name: Intubation Date/Time: 08/07/2013 12:03 PM Performed by: Cheryl [name], Cheryl [name] Pre-anesthesia Checklist: Patient identified, Patient being monitored, Emergency Drugs available, Timeout performed and Suction available Patient Re-evaluated:Patient Re-evaluated prior to inductionOxygen Delivery Method: Circle system utilized Preoxygenation: Pre-oxygenation with 100% oxygen Intubation Type: IV induction Ventilation: Mask ventilation without difficulty Laryngoscope Size: Mac and 3 Grade View: Grade II Tube type: Oral Tube size: 7.5 mm Number of attempts: 1 Airway Equipment and Method: Stylet Placement Confirmation: ETT inserted through vocal cords under direct vision,  positive ETCO2 and breath sounds checked- equal and bilateral Secured at: 21 cm Tube secured with: Tape Dental Injury: Teeth and Oropharynx as per pre-operative assessment

## 2013-08-07 NOTE — Progress Notes (Signed)
Patient ID: Cheryl [name], female   DOB: 24-Apr-1934, 79 y.o.   MRN: [id number] Subjective:  The patient is somnolent but easily arousable. She is in no apparent distress. She looks well.  Objective: Vital signs in last 24 hours: Temp:  [97.9 F (36.6 C)] 97.9 F (36.6 C) (07/02 1640) Pulse Rate:  [55-74] 55 (07/02 1644) Resp:  [15-18] 17 (07/02 1645) BP: (109-150)/(49-86) 109/49 mmHg (07/02 1644) SpO2:  [98 %-100 %] 100 % (07/02 1644) Weight:  [78.472 kg (173 lb)] 78.472 kg (173 lb) (07/02 0913)  Intake/Output from previous day:   Intake/Output this shift: Total I/O In: 2250 [I.V.:2000; IV Piggyback:250] Out: 750 [Urine:450; Blood:300]  Physical exam the patient is, but arousable. She is moving her lower extremities well.  Lab Results: No results found for this basename: WBC, HGB, HCT, PLT,  in the last 72 hours BMET No results found for this basename: NA, K, CL, CO2, GLUCOSE, BUN, CREATININE, CALCIUM,  in the last 72 hours  Studies/Results: Dg Lumbar Spine 2-3 Views  08/07/2013   CLINICAL DATA:  79 year old female undergoing lumbar spine surgery. Initial encounter. Spondylolisthesis with neurogenic claudication and degenerative disc disease.  EXAM: LUMBAR SPINE - 2-3 VIEW; DG C-ARM 61-120 MIN  COMPARISON:  Intraoperative radiographs 1244 hr the same day and earlier.  FLUOROSCOPY TIME:  0 min 24 seconds  FINDINGS: Intraoperative fluoroscopic images of the lower lumbar spine in the frontal on lateral projection. Utilizing the same numbering system as on today's intraoperative radiograph, posterior and interbody fusion at L3-L4 and L4-L5 is depicted. The AP view also suggests some decompression of these levels.  IMPRESSION: L3-L4 and L4-L5 posterior and interbody fusion plus decompression.   Electronically Signed   By: Augusto GambleLee  Hall M.D.   On: 08/07/2013 16:12   Dg Lumbar Spine 1 View  08/07/2013   CLINICAL DATA:  L3-4/ 4 5 PLIF  EXAM: LUMBAR SPINE - 1 VIEW  COMPARISON:  Plain film from  06/17/2013 and MRI a from 08/24/2012 as well as a chest x-ray from 07/29/2013.  FINDINGS: Five lumbar type vertebral bodies are again well visualized. Numbering nomenclature is similar to that utilized on prior MRI examination as well as counting 12 paired ribs. Surgical instrument is noted posterior to the L3-4 interspace. Mild anterolisthesis of L4 on L5 is noted.   Electronically Signed   By: Alcide CleverMark  Lukens M.D.   On: 08/07/2013 14:33   Dg C-arm 1-60 Min  08/07/2013   CLINICAL DATA:  79 year old female undergoing lumbar spine surgery. Initial encounter. Spondylolisthesis with neurogenic claudication and degenerative disc disease.  EXAM: LUMBAR SPINE - 2-3 VIEW; DG C-ARM 61-120 MIN  COMPARISON:  Intraoperative radiographs 1244 hr the same day and earlier.  FLUOROSCOPY TIME:  0 min 24 seconds  FINDINGS: Intraoperative fluoroscopic images of the lower lumbar spine in the frontal on lateral projection. Utilizing the same numbering system as on today's intraoperative radiograph, posterior and interbody fusion at L3-L4 and L4-L5 is depicted. The AP view also suggests some decompression of these levels.  IMPRESSION: L3-L4 and L4-L5 posterior and interbody fusion plus decompression.   Electronically Signed   By: Augusto GambleLee  Hall M.D.   On: 08/07/2013 16:12    Assessment/Plan: The patient is doing well.  LOS: 0 days     Kohler Pellerito D 08/07/2013, 4:55 PM

## 2013-08-08 LAB — BASIC METABOLIC PANEL
ANION GAP: 10 (ref 5–15)
BUN: 15 mg/dL (ref 6–23)
CALCIUM: 8.5 mg/dL (ref 8.4–10.5)
CHLORIDE: 102 meq/L (ref 96–112)
CO2: 26 meq/L (ref 19–32)
CREATININE: 1 mg/dL (ref 0.50–1.10)
GFR calc Af Amer: 61 mL/min — ABNORMAL LOW (ref >90)
GFR calc non Af Amer: 53 mL/min — ABNORMAL LOW (ref >90)
GLUCOSE: 96 mg/dL (ref 70–99)
Potassium: 4.4 mEq/L (ref 3.7–5.3)
Sodium: 138 mEq/L (ref 137–147)

## 2013-08-08 LAB — CBC
HEMATOCRIT: 27.7 % — AB (ref 36.0–46.0)
HEMOGLOBIN: 9.4 g/dL — AB (ref 12.0–15.0)
MCH: 33.1 pg (ref 26.0–34.0)
MCHC: 33.9 g/dL (ref 30.0–36.0)
MCV: 97.5 fL (ref 78.0–100.0)
PLATELETS: 178 10*3/uL (ref 150–400)
RBC: 2.84 MIL/uL — ABNORMAL LOW (ref 3.87–5.11)
RDW: 12.6 % (ref 11.5–15.5)
WBC: 6.5 10*3/uL (ref 4.0–10.5)

## 2013-08-08 NOTE — Progress Notes (Signed)
PT Cancellation Note  Patient Details Name: Cheryl [name] MRN: [id number] DOB: November 05, 1934   Cancelled Treatment:    Reason Eval/Treat Not Completed: Other (comment). Awaiting back brace prior to OOB activities for evaluation.    Donnamarie PoagWest, Rhyleigh Grassel LaporteN, Cheryl [name]  [phone] 08/08/2013, 8:35 AM

## 2013-08-08 NOTE — Evaluation (Signed)
Physical Therapy Evaluation Patient Details Name: Cheryl [name] MRN: [id number] DOB: 08-24-1934 Today's Date: 08/08/2013   History of Present Illness  s/p L3-L5 posterior lumbar fusion  Clinical Impression  Patient is s/p surgery listed above resulting in the deficits listed below (see PT Problem List). Patient will benefit from skilled PT to increase their independence and safety with mobility (while adhering to their precautions) to allow discharge home with family. Pt limited in evaluation due to nausea and fatigue. BP after activity 108/48.     Follow Up Recommendations No PT follow up;Supervision/Assistance - 24 hour    Equipment Recommendations  Rolling walker with 5" wheels    Recommendations for Other Services OT consult     Precautions / Restrictions Precautions Precautions: Fall;Back Precaution Booklet Issued: Yes (comment) Precaution Comments: pt given handout and educated on back precautions  Required Braces or Orthoses: Spinal Brace Spinal Brace: Applied in sitting position;Lumbar corset Restrictions Weight Bearing Restrictions: No      Mobility  Bed Mobility Overal bed mobility: Needs Assistance Bed Mobility: Rolling;Sidelying to Sit Rolling: Min assist Sidelying to sit: Min assist       General bed mobility comments: min (A) to facilitate log rolling technique and acheive upright sitting position; max cues for technique and hand placement   Transfers Overall transfer level: Needs assistance Equipment used: Rolling walker (2 wheeled) Transfers: Sit to/from Stand Sit to Stand: Min assist         General transfer comment: (A) to achieve upright standing position and max cues for hand placement and sequencing with RW; pt greatly limited due to pain and LE weakness   Ambulation/Gait Ambulation/Gait assistance: Min assist Ambulation Distance (Feet): 12 Feet Assistive device: Rolling walker (2 wheeled) Gait Pattern/deviations: Step-through  pattern;Decreased step length - left;Decreased stance time - right;Decreased stance time - left;Shuffle;Narrow base of support Gait velocity: very decreased Gait velocity interpretation: Below normal speed for age/gender General Gait Details: cues for upright posture and gt sequencing; pt requires min (A) to maintain balance; pt fatigues easily and limited by nausea   Stairs            Wheelchair Mobility    Modified Rankin (Stroke Patients Only)       Balance Overall balance assessment: Needs assistance Sitting-balance support: Feet supported;No upper extremity supported Sitting balance-Leahy Scale: Fair Sitting balance - Comments: pt sat EOB ~10 min prior to ambulation; no c/o dizziness    Standing balance support: During functional activity;Bilateral upper extremity supported Standing balance-Leahy Scale: Poor Standing balance comment: relies heavily on UE support                              Pertinent Vitals/Pain 8/10; pt premedicated; 108/48 after activity     Home Living Family/patient expects to be discharged to:: Private residence Living Arrangements: Spouse/significant other Available Help at Discharge: Family;Available 24 hours/day Type of Home: House Home Access: Stairs to enter Entrance Stairs-Rails: None Entrance Stairs-Number of Steps: 1 Home Layout: One level Home Equipment: None Additional Comments: tub shower with lip to step over    Prior Function Level of Independence: Independent         Comments: pt very independent PTA; drives and is active      Hand Dominance   Dominant Hand: Left    Extremity/Trunk Assessment   Upper Extremity Assessment: Defer to OT evaluation           Lower Extremity Assessment: Generalized  weakness (secondary to surgery)      Cervical / Trunk Assessment: Normal  Communication   Communication: No difficulties  Cognition Arousal/Alertness: Awake/alert Behavior During Therapy: WFL for  tasks assessed/performed Overall Cognitive Status: Within Functional Limits for tasks assessed                      General Comments      Exercises        Assessment/Plan    PT Assessment Patient needs continued PT services  PT Diagnosis Difficulty walking;Generalized weakness;Acute pain   PT Problem List Decreased strength;Decreased activity tolerance;Decreased balance;Decreased mobility;Decreased knowledge of precautions;Decreased safety awareness;Decreased knowledge of use of DME;Pain  PT Treatment Interventions DME instruction;Gait training;Functional mobility training;Therapeutic activities;Therapeutic exercise;Stair training;Balance training;Neuromuscular re-education;Patient/family education   PT Goals (Current goals can be found in the Care Plan section) Acute Rehab PT Goals Patient Stated Goal: to not be sick PT Goal Formulation: With patient Time For Goal Achievement: 08/12/13 Potential to Achieve Goals: Good    Frequency Min 5X/week   Barriers to discharge        Co-evaluation               End of Session Equipment Utilized During Treatment: Gait belt;Back brace Activity Tolerance: Other (comment);Patient limited by fatigue (nausea ) Patient left: in chair;with call bell/phone within reach;with family/visitor present Nurse Communication: Mobility status;Other (comment) (BP status after activity)         Time: 9147-82951050-1114 PT Time Calculation (min): 24 min   Charges:   PT Evaluation $Initial PT Evaluation Tier I: 1 Procedure PT Treatments $Gait Training: 8-22 mins   PT G CodesDonell Sievert:          Rohan Juenger N, [place]PT  [phone] 08/08/2013, 12:44 PM

## 2013-08-08 NOTE — Progress Notes (Signed)
Utilization review completed. Drayce Tawil, RN, BSN. 

## 2013-08-08 NOTE — Progress Notes (Signed)
Patient ID: Cheryl [name], female   DOB: 04/29/1934, 79 y.o.   MRN: [id number] Afeb, vss No new neuro issues. Back appropriately sore. Will increase activity as tolerated.

## 2013-08-08 NOTE — Progress Notes (Signed)
Orthopedic Tech Progress Note Patient Details:  Cheryl [name] 1934-11-12 [id number]  Ortho Devices Type of Ortho Device: Abdominal binder Ortho Device/Splint Interventions: Application   Cammer, Mickie BailJennifer Carol 08/08/2013, 11:23 AM

## 2013-08-09 NOTE — Progress Notes (Signed)
Patient ID: Cheryl [name], female   DOB: 12/20/1934, 79 y.o.   MRN: [id number] C/o incisional pain, ambulating with help

## 2013-08-09 NOTE — Progress Notes (Signed)
Physical Therapy Treatment Patient Details Name: Cheryl QualeLina B [name] MRN: [id number] DOB: [birth year] Today's Date: 08/09/2013    History of Present Illness s/p L3-L5 posterior lumbar fusion    PT Comments    Pt very slow to progress mobility. Concerned regarding inability to void. Ambulated in hallway and to bathroom but unable to void during therapy. Pt with ataxic like movements in LEs intermittently and c/o "knee" instability. D/C disposition updated to recommend HHPT due to slow progression with therapy. Will cont to follow per POC.   Follow Up Recommendations  Home health PT;Supervision/Assistance - 24 hour     Equipment Recommendations  Rolling walker with 5" wheels    Recommendations for Other Services OT consult     Precautions / Restrictions Precautions Precautions: Fall;Back Precaution Comments: pt able to independently recall 2/3 back precautions; educated on precautions throughout session  Required Braces or Orthoses: Spinal Brace Spinal Brace: Applied in sitting position;Lumbar corset Restrictions Weight Bearing Restrictions: No    Mobility  Bed Mobility Overal bed mobility: Needs Assistance Bed Mobility: Rolling;Sidelying to Sit Rolling: Supervision Sidelying to sit: Min assist       General bed mobility comments: pt able to independently recall log rolling technique; required min (A) to bring LEs fully of EOB to promote elevation of trunk   Transfers Overall transfer level: Needs assistance Equipment used: Rolling walker (2 wheeled) Transfers: Sit to/from Stand Sit to Stand: Min guard         General transfer comment: min guard to steady during transition of UEs from bed/commode to RW; cues for sequencing and safety with transfers   Ambulation/Gait Ambulation/Gait assistance: Min guard Ambulation Distance (Feet): 50 Feet Assistive device: Rolling walker (2 wheeled) Gait Pattern/deviations: Step-through pattern;Ataxic;Decreased stride length;Decreased  dorsiflexion - right;Shuffle;Narrow base of support;Trunk flexed;Drifts right/left Gait velocity: very decreased Gait velocity interpretation: Below normal speed for age/gender General Gait Details: pt with ataxic like movements intermittently with bil LEs; reports "knees feel weak"; min guard to steady and manage RW; pt with difficulty avoiding obstacles and requires incr time too problem solve   Stairs            Wheelchair Mobility    Modified Rankin (Stroke Patients Only)       Balance Overall balance assessment: Needs assistance Sitting-balance support: Feet supported;No upper extremity supported Sitting balance-Leahy Scale: Good Sitting balance - Comments: no c/o dizziness   Standing balance support: During functional activity;Bilateral upper extremity supported Standing balance-Leahy Scale: Poor Standing balance comment: relies heavily on bil UE support from RW                    Cognition Arousal/Alertness: Awake/alert Behavior During Therapy: [facility] for tasks assessed/performed Overall Cognitive Status: Within Functional Limits for tasks assessed       Memory: Decreased recall of precautions              Exercises      General Comments        Pertinent Vitals/Pain 8/10; RN aware.     Home Living                      Prior Function            PT Goals (current goals can now be found in the care plan section) Acute Rehab PT Goals Patient Stated Goal: to get moving better PT Goal Formulation: With patient Time For Goal Achievement: 08/12/13 Potential to Achieve Goals: Good Progress towards PT goals:  Progressing toward goals    Frequency  Min 5X/week    PT Plan Discharge plan needs to be updated    Co-evaluation             End of Session Equipment Utilized During Treatment: Gait belt;Back brace Activity Tolerance: Patient tolerated treatment well Patient left: in chair;with call bell/phone within reach;with  family/visitor present;with nursing/sitter in room     Time: 0922-0946 PT Time Calculation (min): 24 min  Charges:  $Gait Training: 23-37 mins                    G Codes:      Cheryl [name], Cheryl [name] N , [phone]  08/09/2013, 9:57 AM

## 2013-08-10 MED ORDER — DIAZEPAM 2 MG PO TABS: 2.0000 mg | ORAL_TABLET | Freq: Four times a day (QID) | ORAL | Status: DC | PRN

## 2013-08-10 NOTE — Progress Notes (Signed)
Patient ID: Cheryl [name], female   DOB: [birth year], 79 y.o.   MRN: [id number] Ambulating with help. Pain in the right leg. No weakness. Dc drain, see orders

## 2013-08-10 NOTE — Progress Notes (Signed)
Physical Therapy Treatment Patient Details Name: Cheryl [name] MRN: [id number] DOB: 1934-04-15 Today's Date: 08/10/2013    History of Present Illness s/p L3-L5 posterior lumbar fusion    PT Comments    Pt very slow to progress with therapy. More lethargic/drowsy during session today. Tends to keep rt eye closed; cues for attention throughout. Min (A) for ambulation and stair negotiation at this time. Will cont to follow per POC.   Follow Up Recommendations  Home health PT;Supervision/Assistance - 24 hour     Equipment Recommendations  Rolling walker with 5" wheels    Recommendations for Other Services OT consult     Precautions / Restrictions Precautions Precautions: Fall;Back Precaution Comments: able to independently recall 2/3; has difficulty applying during session and requires cues  Required Braces or Orthoses: Spinal Brace Spinal Brace: Applied in sitting position;Lumbar corset Restrictions Weight Bearing Restrictions: No    Mobility  Bed Mobility Overal bed mobility: Needs Assistance Bed Mobility: Rolling;Sidelying to Sit Rolling: Modified independent (Device/Increase time) Sidelying to sit: Supervision       General bed mobility comments: requires incr time and use of handrails; min cues for technique   Transfers Overall transfer level: Needs assistance Equipment used: Rolling walker (2 wheeled) Transfers: Sit to/from Stand Sit to Stand: Supervision         General transfer comment: supervision for cues for hand placement and safety with RW today  Ambulation/Gait Ambulation/Gait assistance: Min assist;Min guard Ambulation Distance (Feet): 125 Feet Assistive device: Rolling walker (2 wheeled) Gait Pattern/deviations: Decreased stride length;Ataxic;Drifts right/left;Narrow base of support;Shuffle (Rt LE ataxic at times and ER) Gait velocity: very decreased Gait velocity interpretation: Below normal speed for age/gender General Gait Details: pt had 2  LOB posteriorly and required (A) to maintain balance; pt with difficulty managing RW around obstacles and requries (A) and cues to manage; educated family on guarding technique    Stairs Stairs: Yes Stairs assistance: Min assist Stair Management: No rails;Step to pattern;Forwards;With walker Number of Stairs: 1 General stair comments: (A) to manage RW; cues for sequencing and safety; daughter present for education   Wheelchair Mobility    Modified Rankin (Stroke Patients Only)       Balance Overall balance assessment: Needs assistance Sitting-balance support: Feet supported;No upper extremity supported Sitting balance-Leahy Scale: Good     Standing balance support: During functional activity;Bilateral upper extremity supported;No upper extremity supported Standing balance-Leahy Scale: Poor Standing balance comment: had LOB poseriorly without UE support when donning glasses                     Cognition Arousal/Alertness: Lethargic;Suspect due to medications Behavior During Therapy: Flat affect Overall Cognitive Status: Impaired/Different from baseline Area of Impairment: Problem solving;Attention   Current Attention Level: Sustained Memory: Decreased recall of precautions       Problem Solving: Difficulty sequencing;Slow processing General Comments: difficulty processing and problem solving believed to be due to medications per daughter     Exercises      General Comments General comments (skin integrity, edema, etc.): educated family on guarding technique and (A) required for pt safety at this time       Pertinent Vitals/Pain 4/10    Home Living                      Prior Function            PT Goals (current goals can now be found in the care plan section) Acute Rehab  PT Goals Patient Stated Goal: to get moving better PT Goal Formulation: With patient Time For Goal Achievement: 08/12/13 Potential to Achieve Goals: Good Progress towards  PT goals: Progressing toward goals    Frequency  Min 5X/week    PT Plan Current plan remains appropriate    Co-evaluation             End of Session Equipment Utilized During Treatment: Gait belt;Back brace Activity Tolerance: Patient limited by fatigue;Patient limited by lethargy Patient left: in chair;with call bell/phone within reach;with family/visitor present     Time: 1610-96041028-1052 PT Time Calculation (min): 24 min  Charges:  Automatic Data$Gait Training: 23-37 mins                    G CodesDonell Sievert:      Magnum Lunde N, [place] CarolinaPT  [phone] 08/10/2013, 10:59 AM

## 2013-08-11 MED ORDER — OXYCODONE-ACETAMINOPHEN 5-325 MG PO TABS: 1.0000 | ORAL_TABLET | ORAL | Status: DC | PRN

## 2013-08-11 MED ORDER — DIAZEPAM 2 MG PO TABS: 2.0000 mg | ORAL_TABLET | Freq: Four times a day (QID) | ORAL | Status: DC | PRN

## 2013-08-11 MED ORDER — DSS 100 MG PO CAPS: 100.0000 mg | ORAL_CAPSULE | Freq: Two times a day (BID) | ORAL | Status: DC

## 2013-08-11 MED FILL — Heparin Sodium (Porcine) Inj 1000 Unit/ML: INTRAMUSCULAR | Qty: 30 | Status: AC

## 2013-08-11 MED FILL — Sodium Chloride IV Soln 0.9%: INTRAVENOUS | Qty: 1000 | Status: AC

## 2013-08-11 NOTE — Discharge Summary (Signed)
Physician Discharge Summary  Patient ID: Cheryl [name] MRN: [id number] DOB/AGE: 77-Aug-1936 77 y.o.  Admit date: 08/07/2013 Discharge date: 08/11/2013  Admission Diagnoses: L3-4 and L4-5 spondylolisthesis, spinal stenosis, lumbago, lumbar radiculopathy, neurogenic claudication  Discharge Diagnoses: The same Active Problems:   Spondylolisthesis of lumbar region   Discharged Condition: good  Hospital Course: I performed an L2-3, L3-4 and L4-5 decompressive laminectomy with an L3-4 and L4-5 instrumentation and fusion on the patient on 08/07/13. The surgery went well.  The patient's postoperative course was unremarkable. On postoperative day #4 the patient requested discharge to home. The patient, and Cheryl [name] husband, were given oral and written discharge instructions. All their questions were answered.  Consults: PT Significant Diagnostic Studies: None Treatments: L2-3, L3-4 and L4-5 laminectomy with L3-4 and L4-5 instrumentation and fusion. Discharge Exam: Blood pressure 143/61, pulse 67, temperature 97.8 F (36.6 C), temperature source Oral, resp. rate 18, height 5\' 6"  (1.676 m), weight 84.233 kg (185 lb 11.2 oz), SpO2 91.00%. The patient is alert and pleasant. She looks well. Cheryl [name] strength is normal in Cheryl [name] lower extremities.  Disposition: Home  Discharge Instructions   Call MD for:  difficulty breathing, headache or visual disturbances    Complete by:  As directed      Call MD for:  extreme fatigue    Complete by:  As directed      Call MD for:  hives    Complete by:  As directed      Call MD for:  persistant dizziness or light-headedness    Complete by:  As directed      Call MD for:  persistant nausea and vomiting    Complete by:  As directed      Call MD for:  redness, tenderness, or signs of infection (pain, swelling, redness, odor or green/yellow discharge around incision site)    Complete by:  As directed      Call MD for:  severe uncontrolled pain    Complete by:  As directed       Call MD for:  temperature >100.4    Complete by:  As directed      Diet - low sodium heart healthy    Complete by:  As directed      Discharge instructions    Complete by:  As directed   Call [phone] for a followup appointment. Take a stool softener while you are using pain medications.     Driving Restrictions    Complete by:  As directed   Do not drive for 2 weeks.     Increase activity slowly    Complete by:  As directed      Lifting restrictions    Complete by:  As directed   Do not lift more than 5 pounds. No excessive bending or twisting.     May shower / Bathe    Complete by:  As directed   He may shower after the pain she is removed 3 days after surgery. Leave the incision alone.     No dressing needed    Complete by:  As directed             Medication List    STOP taking these medications       ibuprofen 200 MG tablet  Commonly known as:  ADVIL,MOTRIN      TAKE these medications       aspirin EC 81 MG tablet  Take 81 mg by mouth daily.     b  complex vitamins capsule  Take 1 capsule by mouth daily.     diazepam 2 MG tablet  Commonly known as:  VALIUM  Take 1 tablet (2 mg total) by mouth every 6 (six) hours as needed for anxiety.     DSS 100 MG Caps  Take 100 mg by mouth 2 (two) times daily.     flecainide 50 MG tablet  Commonly known as:  TAMBOCOR  Take 50 mg by mouth every evening.     loratadine 10 MG tablet  Commonly known as:  CLARITIN  Take 10 mg by mouth daily as needed for allergies.     multivitamin with minerals Tabs tablet  Take 1 tablet by mouth daily.     oxyCODONE-acetaminophen 5-325 MG per tablet  Commonly known as:  PERCOCET/ROXICET  Take 1-2 tablets by mouth every 4 (four) hours as needed for moderate pain.     traZODone 100 MG tablet  Commonly known as:  DESYREL  Take 100 mg by mouth at bedtime.     Vitamin D 2000 UNITS tablet  Take 2,000 Units by mouth daily.         SignedCristi Loron: Coree Riester D 08/11/2013,  9:31 AM

## 2013-08-11 NOTE — Care Management Note (Signed)
    Page 1 of 1   08/11/2013     10:43:53 AM CARE MANAGEMENT NOTE 08/11/2013  Patient:  Cheryl [name], Cheryl [name]   Account Number:  [id number]  Date Initiated:  08/11/2013  Documentation initiated by:  Lorne Skeens  Subjective/Objective Assessment:   Patient was admitted with PLIF. Lives at home with spouse.     Action/Plan:   will follow for discharge needs pending PT/OT evals and physician orders.   Anticipated DC Date:  08/11/2013   Anticipated DC Plan:  [place]  CM consult      Choice offered to / List presented to:             Status of service:  Completed, signed off Medicare Important Message given?  YES (If response is "NO", the following Medicare IM given date fields will be blank) Date Medicare IM given:  08/11/2013 Medicare IM given by:  Lorne Skeens Date Additional Medicare IM given:   Additional Medicare IM given by:    Discharge Disposition:  HOME/SELF CARE  Per UR Regulation:  Reviewed for med. necessity/level of care/duration of stay  If discussed at [place] of Stay Meetings, dates discussed:    Comments:  08/11/13 Victoria Vera, MSN, CM- Per conversation with Dr Arnoldo Morale, patient will be discharging home today and does not require any home health services.  CM met with patient and family to provide Medicare IM letter.

## 2013-08-11 NOTE — Progress Notes (Signed)
Physical Therapy Treatment Patient Details Name: Cheryl QualeLina B [name] MRN: [id number] DOB: October 15, 1934 Today's Date: 08/11/2013    History of Present Illness s/p L3-L5 posterior lumbar fusion    PT Comments    Pt progressing slow demonstrating minimal motivation to learn compensatory techniques to achieve indep with ADLs to adhere to back precautions. Pt did improve ambulation endurance this session. Pt able to recall 3/3 back precautions but cont' to require v/c's to adhere to them.   Follow Up Recommendations  Home health PT;Supervision/Assistance - 24 hour     Equipment Recommendations  Rolling walker with 5" wheels    Recommendations for Other Services OT consult     Precautions / Restrictions Precautions Precautions: Fall;Back Precaution Booklet Issued: Yes (comment) Precaution Comments: pt able to recall 3/3 back precautions but requires v/c's to adhere to them Required Braces or Orthoses: Spinal Brace Spinal Brace: Applied in sitting position;Lumbar corset (pt able to indep don/doff brace appropriately) Restrictions Weight Bearing Restrictions: No    Mobility  Bed Mobility Overal bed mobility: Needs Assistance Bed Mobility: Rolling;Sit to Sidelying Rolling: Modified independent (Device/Increase time)       Sit to sidelying: Min guard General bed mobility comments: v/c's required to limit twisting, pt requested assist for LE management however pt was able to complete indep with increased time  Transfers Overall transfer level: Needs assistance Equipment used: Rolling walker (2 wheeled) Transfers: Sit to/from Stand Sit to Stand: Supervision         General transfer comment: pt demo'd good technique  Ambulation/Gait Ambulation/Gait assistance: Min guard Ambulation Distance (Feet): 150 Feet Assistive device: Rolling walker (2 wheeled) Gait Pattern/deviations: Step-through pattern;Decreased stride length Gait velocity: slow Gait velocity interpretation: Below  normal speed for age/gender General Gait Details: pt with c/o "I'm tired" however to amb without LOB but did demo R LE antalgic limp and began to demo ER during amb   Stairs            Wheelchair Mobility    Modified Rankin (Stroke Patients Only)       Balance           Standing balance support: Bilateral upper extremity supported Standing balance-Leahy Scale: Poor Standing balance comment: attempted to ambulate without RW pt with lob                    Cognition Arousal/Alertness: Awake/alert Behavior During Therapy: Flat affect Overall Cognitive Status: Within Functional Limits for tasks assessed                 General Comments: pt with decreased interest in education on lower body dressing deferring it to her husband. "my husband can do it for me." pt with slow processing but suspect due to pain medication/lethargy    Exercises      General Comments        Pertinent Vitals/Pain Did not report pain, c/o fatigue    Home Living                      Prior Function            PT Goals (current goals can now be found in the care plan section) Progress towards PT goals: Progressing toward goals    Frequency  Min 5X/week    PT Plan Current plan remains appropriate    Co-evaluation             End of Session Equipment Utilized During Treatment: Gait belt;Back brace  Activity Tolerance: Patient limited by fatigue;Patient limited by lethargy Patient left: in bed;with call bell/phone within reach     Time: 0820-0836 PT Time Calculation (min): 16 min  Charges:  $Gait Training: 8-22 mins                    G Codes:      Cheryl BrawnChadwell, Cheryl [name] 08/11/2013, 8:59 AM  Cheryl ShockAshly Mckenleigh [name], PT, DPT Pager #: [phone] Office #: [phone]

## 2013-08-11 NOTE — Progress Notes (Signed)
Patient is discharged with family. She has no complaints of pain or distress.

## 2013-10-16 ENCOUNTER — Ambulatory Visit: Payer: Medicare Other | Attending: Neurosurgery | Admitting: Physical Therapy

## 2013-10-16 DIAGNOSIS — M545 Low back pain, unspecified: Secondary | ICD-10-CM | POA: Insufficient documentation

## 2013-10-16 DIAGNOSIS — IMO0001 Reserved for inherently not codable concepts without codable children: Secondary | ICD-10-CM | POA: Insufficient documentation

## 2013-10-16 DIAGNOSIS — Z9889 Other specified postprocedural states: Secondary | ICD-10-CM | POA: Diagnosis not present

## 2013-10-16 DIAGNOSIS — R5381 Other malaise: Secondary | ICD-10-CM | POA: Insufficient documentation

## 2013-10-21 ENCOUNTER — Ambulatory Visit: Payer: Medicare Other | Admitting: Physical Therapy

## 2013-10-21 DIAGNOSIS — IMO0001 Reserved for inherently not codable concepts without codable children: Secondary | ICD-10-CM | POA: Diagnosis not present

## 2013-10-23 ENCOUNTER — Ambulatory Visit: Payer: Medicare Other | Admitting: Physical Therapy

## 2013-10-23 DIAGNOSIS — IMO0001 Reserved for inherently not codable concepts without codable children: Secondary | ICD-10-CM | POA: Diagnosis not present

## 2013-10-28 ENCOUNTER — Ambulatory Visit: Payer: Medicare Other | Admitting: Physical Therapy

## 2013-10-28 DIAGNOSIS — IMO0001 Reserved for inherently not codable concepts without codable children: Secondary | ICD-10-CM | POA: Diagnosis not present

## 2013-10-30 ENCOUNTER — Ambulatory Visit: Payer: Medicare Other | Admitting: Physical Therapy

## 2013-10-30 DIAGNOSIS — IMO0001 Reserved for inherently not codable concepts without codable children: Secondary | ICD-10-CM | POA: Diagnosis not present

## 2013-11-04 ENCOUNTER — Ambulatory Visit: Payer: Medicare Other | Admitting: Physical Therapy

## 2013-11-04 DIAGNOSIS — IMO0001 Reserved for inherently not codable concepts without codable children: Secondary | ICD-10-CM | POA: Diagnosis not present

## 2013-11-06 ENCOUNTER — Ambulatory Visit: Payer: Medicare Other | Admitting: Physical Therapy

## 2013-11-11 ENCOUNTER — Ambulatory Visit: Payer: Medicare Other | Attending: Neurosurgery | Admitting: Physical Therapy

## 2013-11-11 DIAGNOSIS — I10 Essential (primary) hypertension: Secondary | ICD-10-CM | POA: Insufficient documentation

## 2013-11-11 DIAGNOSIS — R5381 Other malaise: Secondary | ICD-10-CM | POA: Diagnosis not present

## 2013-11-11 DIAGNOSIS — Z9889 Other specified postprocedural states: Secondary | ICD-10-CM | POA: Diagnosis not present

## 2013-11-11 DIAGNOSIS — M545 Low back pain: Secondary | ICD-10-CM | POA: Insufficient documentation

## 2013-11-13 ENCOUNTER — Ambulatory Visit: Payer: Medicare Other | Admitting: Physical Therapy

## 2013-11-13 ENCOUNTER — Other Ambulatory Visit: Payer: Self-pay | Admitting: Cardiology

## 2013-11-13 DIAGNOSIS — M545 Low back pain: Secondary | ICD-10-CM | POA: Diagnosis not present

## 2013-11-17 ENCOUNTER — Ambulatory Visit: Payer: Medicare Other | Admitting: Physical Therapy

## 2013-11-17 DIAGNOSIS — M545 Low back pain: Secondary | ICD-10-CM | POA: Diagnosis not present

## 2013-11-20 ENCOUNTER — Ambulatory Visit: Payer: Medicare Other | Admitting: Physical Therapy

## 2013-11-20 DIAGNOSIS — M545 Low back pain: Secondary | ICD-10-CM | POA: Diagnosis not present

## 2013-11-24 ENCOUNTER — Ambulatory Visit: Payer: Medicare Other | Admitting: Physical Therapy

## 2013-11-24 DIAGNOSIS — M545 Low back pain: Secondary | ICD-10-CM | POA: Diagnosis not present

## 2013-11-27 ENCOUNTER — Ambulatory Visit: Payer: Medicare Other | Admitting: Physical Therapy

## 2013-11-27 DIAGNOSIS — M545 Low back pain: Secondary | ICD-10-CM | POA: Diagnosis not present

## 2013-12-01 ENCOUNTER — Ambulatory Visit: Payer: Medicare Other | Admitting: Physical Therapy

## 2013-12-01 DIAGNOSIS — M545 Low back pain: Secondary | ICD-10-CM | POA: Diagnosis not present

## 2013-12-04 ENCOUNTER — Ambulatory Visit: Payer: Medicare Other | Admitting: Physical Therapy

## 2013-12-04 DIAGNOSIS — M545 Low back pain: Secondary | ICD-10-CM | POA: Diagnosis not present

## 2013-12-08 ENCOUNTER — Ambulatory Visit: Payer: Medicare Other | Attending: Neurosurgery | Admitting: Physical Therapy

## 2013-12-08 DIAGNOSIS — I10 Essential (primary) hypertension: Secondary | ICD-10-CM | POA: Diagnosis not present

## 2013-12-08 DIAGNOSIS — M545 Low back pain: Secondary | ICD-10-CM | POA: Insufficient documentation

## 2013-12-08 DIAGNOSIS — R5381 Other malaise: Secondary | ICD-10-CM | POA: Insufficient documentation

## 2013-12-08 DIAGNOSIS — Z9889 Other specified postprocedural states: Secondary | ICD-10-CM | POA: Insufficient documentation

## 2013-12-11 ENCOUNTER — Ambulatory Visit: Payer: Medicare Other | Admitting: Physical Therapy

## 2013-12-11 DIAGNOSIS — M545 Low back pain: Secondary | ICD-10-CM | POA: Diagnosis not present

## 2013-12-24 ENCOUNTER — Ambulatory Visit (INDEPENDENT_AMBULATORY_CARE_PROVIDER_SITE_OTHER): Payer: Medicare Other | Admitting: Cardiology

## 2013-12-24 ENCOUNTER — Encounter: Payer: Self-pay | Admitting: Cardiology

## 2013-12-24 VITALS — BP 134/92 | HR 81 | Ht 63.0 in | Wt 155.0 lb

## 2013-12-24 DIAGNOSIS — I2510 Atherosclerotic heart disease of native coronary artery without angina pectoris: Secondary | ICD-10-CM

## 2013-12-24 DIAGNOSIS — I10 Essential (primary) hypertension: Secondary | ICD-10-CM

## 2013-12-24 DIAGNOSIS — E785 Hyperlipidemia, unspecified: Secondary | ICD-10-CM

## 2013-12-24 DIAGNOSIS — I480 Paroxysmal atrial fibrillation: Secondary | ICD-10-CM

## 2013-12-24 NOTE — Patient Instructions (Signed)
The current medical regimen is effective;  continue present plan and medications.  Follow up in 6 months with Dr. Skains.  You will receive a letter in the mail 2 months before you are due.  Please call us when you receive this letter to schedule your follow up appointment.  

## 2013-12-24 NOTE — Progress Notes (Signed)
[address] [place], [place]  [address] Phone: [phone] Fax:  [phone]  Date:  12/24/2013   ID:  Cheryl [name], DOB 1934/02/26, MRN [id number]  PCP:  Cala BradfordWHITE,CYNTHIA S, MD   History of Present Illness: Cheryl [name] is a 79 y.o. female with atrial fibrillation currently on antiarrhythmic flecainide here for followup. She had not currently been on anticoagulation despite extensive counseling on risk of stroke. She refused.  Likely familial hyperlipidemia, LDL 211 in September of 2013. Does not wish to take statin medication.  Nor does she want to take chronic anticoagulation. Lengthy discussion.  Huge back surgery in July 2015, fusion. No afib.   Wt Readings from Last 3 Encounters:  12/24/13 155 lb (70.308 kg)  08/07/13 185 lb 11.2 oz (84.233 kg)  07/29/13 173 lb 5 oz (78.614 kg)     Past Medical History  Diagnosis Date  . Hypertension   . Hyperlipidemia   . PAF (paroxysmal atrial fibrillation)   . Obesity   . History of heartburn   . High risk medication use     Flecainide therapy  . Syncope July 2012  . Normal nuclear stress test July 2012  . LVH (left ventricular hypertrophy)     Per echo in July 2012  . Diastolic dysfunction     Grade I, per echo in 2012  . CAD (coronary artery disease)     Mild per remote cath in 2004; normal nuclear in 2012  . Complication of anesthesia     slow to wake up  . Dysrhythmia   . Pneumonia     20 yrs ago  . GERD (gastroesophageal reflux disease)     prevacid  . H/O hiatal hernia   . Headache(784.0)     sinus  . Arthritis     Past Surgical History  Procedure Laterality Date  . Cardiac catheterization  07/28/2002    EF GREATER THAN 55%; MILD CAD  . Back surgery  1980, 2010    x2  . Tonsillectomy      age 31 yrs  . Appendectomy  1952  . Tubal ligation    . Dilation and curettage of uterus    . Colonoscopy      Current Outpatient Prescriptions  Medication Sig Dispense Refill  . aspirin EC  81 MG tablet Take 81 mg by mouth daily.    Marland Kitchen. b complex vitamins capsule Take 1 capsule by mouth daily.    . Cholecalciferol (VITAMIN D) 2000 UNITS tablet Take 2,000 Units by mouth daily.      . diazepam (VALIUM) 2 MG tablet Take 1 tablet (2 mg total) by mouth every 6 (six) hours as needed for anxiety. 50 tablet 1  . flecainide (TAMBOCOR) 50 MG tablet take 1 tablet by mouth once daily 90 tablet 3  . loratadine (CLARITIN) 10 MG tablet Take 10 mg by mouth daily as needed for allergies.    . Multiple Vitamin (MULTIVITAMIN WITH MINERALS) TABS tablet Take 1 tablet by mouth daily.    . traMADol (ULTRAM) 50 MG tablet   0  . traZODone (DESYREL) 100 MG tablet Take 100 mg by mouth at bedtime.      . triamterene-hydrochlorothiazide (MAXZIDE-25) 37.5-25 MG per tablet Take 1 tablet by mouth daily.  0   No current facility-administered medications for this visit.    Allergies:    Allergies  Allergen Reactions  . Amlodipine Besylate     REACTION: dizzy  .  Codeine Phosphate     headache  . Lipitor [Atorvastatin Calcium]     Muscle pain and tiredness  . Sulfonamide Derivatives     REACTION: unspecified    Social History:  The patient  reports that she has never smoked. She has never used smokeless tobacco. She reports that she does not drink alcohol or use illicit drugs.   ROS:  Please see the history of present illness.   Denies any syncope, palpitations, chest pain, orthopnea, rash.   All other systems reviewed and negative.   PHYSICAL EXAM: VS:  BP 134/92 mmHg  Pulse 81  Ht 5\' 3"  (1.6 m)  Wt 155 lb (70.308 kg)  BMI 27.46 kg/m2 Well nourished, well developed, in no acute distress HEENT: normal Neck: no JVD Cardiac:  normal S1, S2; RRR; no murmur Lungs:  clear to auscultation bilaterally, no wheezing, rhonchi or rales Abd: soft, nontender, no hepatomegaly Ext: no edema Skin: warm and dry Neuro: no focal abnormalities noted  EKG:  Sinus rhythm left anterior fascicular block, PAC. No  evidence of atrial fibrillation.     ASSESSMENT AND PLAN:  1. Paroxysmal atrial fibrillation-previously expressed the importance of anticoagulation. She has been informed about stroke risk. CHADS-VAS -3. She states that she worked so hard to get off of warfarin previously. I explained the benefits of the NOAC. She clearly refuses to take anticoagulation. If she changes her mind, she can always contact me. Continue with flecainide for now. She has not been taking her low-dose diltiazem either as previously prescribed as 120 mg. She states that she has been pill bottle if her heart rate increases. 2. Hypertension-mildly elevated today. Encouraged low sodium diet. Exercise. Medication. 82 diastolic at home. 3. Hyperlipidemia-currently taking fish oil. LDL cholesterol was 211 on 09/30/12. Strongly encouraged statin therapy. Not interested currently. Likely familial hyperlipidemia. 4. We will see back in 6 months.  Signed, Donato SchultzMark Skains, MD [facility] CenterFACC  12/24/2013 2:05 PM

## 2014-06-22 ENCOUNTER — Other Ambulatory Visit: Payer: Self-pay | Admitting: *Deleted

## 2014-06-22 ENCOUNTER — Encounter: Payer: Self-pay | Admitting: Cardiology

## 2014-06-22 ENCOUNTER — Ambulatory Visit (INDEPENDENT_AMBULATORY_CARE_PROVIDER_SITE_OTHER): Payer: Medicare Other | Admitting: Cardiology

## 2014-06-22 VITALS — BP 132/86 | HR 60 | Ht 63.0 in | Wt 158.0 lb

## 2014-06-22 DIAGNOSIS — I10 Essential (primary) hypertension: Secondary | ICD-10-CM

## 2014-06-22 DIAGNOSIS — E785 Hyperlipidemia, unspecified: Secondary | ICD-10-CM

## 2014-06-22 DIAGNOSIS — I2510 Atherosclerotic heart disease of native coronary artery without angina pectoris: Secondary | ICD-10-CM | POA: Diagnosis not present

## 2014-06-22 DIAGNOSIS — N183 Chronic kidney disease, stage 3 unspecified: Secondary | ICD-10-CM | POA: Insufficient documentation

## 2014-06-22 DIAGNOSIS — I480 Paroxysmal atrial fibrillation: Secondary | ICD-10-CM

## 2014-06-22 NOTE — Progress Notes (Signed)
[address] [place], [place]  [address] Phone: [phone]) [phone] Fax:  [phone]  Date:  06/22/2014   ID:  Cheryl QualeLina B [name], DOB 1934-04-21, MRN [id number]  PCP:  Cheryl BradfordWHITE,CYNTHIA S, MD   History of Present Illness: Cheryl QualeLina B [name] is a 80 y.o. female (pronounced Cheryl [name]) with atrial fibrillation currently on antiarrhythmic flecainide here for followup. She had not currently been on anticoagulation despite extensive counseling on risk of stroke. She refused.  Likely familial hyperlipidemia, LDL 211 in September of 2013. Does not wish to take statin medication.  Nor does she want to take chronic anticoagulation. Lengthy discussion.  Huge back surgery in July 2015, fusion. No afib.  She will occasionally take an extra flecainide to help her with racing heartbeat. This seems to work well for her. Continue.   Wt Readings from Last 3 Encounters:  06/22/14 158 lb (71.668 kg)  12/24/13 155 lb (70.308 kg)  08/07/13 185 lb 11.2 oz (84.233 kg)     Past Medical History  Diagnosis Date  . Hypertension   . Hyperlipidemia   . PAF (paroxysmal atrial fibrillation)   . Obesity   . History of heartburn   . High risk medication use     Flecainide therapy  . Syncope July 2012  . Normal nuclear stress test July 2012  . LVH (left ventricular hypertrophy)     Per echo in July 2012  . Diastolic dysfunction     Grade I, per echo in 2012  . CAD (coronary artery disease)     Mild per remote cath in 2004; normal nuclear in 2012  . Complication of anesthesia     slow to wake up  . Dysrhythmia   . Pneumonia     20 yrs ago  . GERD (gastroesophageal reflux disease)     prevacid  . H/O hiatal hernia   . Headache(784.0)     sinus  . Arthritis     Past Surgical History  Procedure Laterality Date  . Cardiac catheterization  07/28/2002    EF GREATER THAN 55%; MILD CAD  . Back surgery  1980, 2010    x2  . Tonsillectomy      age 58 yrs  . Appendectomy  1952  . Tubal ligation      . Dilation and curettage of uterus    . Colonoscopy      Current Outpatient Prescriptions  Medication Sig Dispense Refill  . aspirin EC 81 MG tablet Take 81 mg by mouth daily.    . Cholecalciferol (VITAMIN D) 2000 UNITS tablet Take 2,000 Units by mouth daily.      . flecainide (TAMBOCOR) 50 MG tablet take 1 tablet by mouth once daily 90 tablet 3  . loratadine (CLARITIN) 10 MG tablet Take 10 mg by mouth daily as needed for allergies.    . metoprolol succinate (TOPROL-XL) 25 MG 24 hr tablet Take 25 mg by mouth daily.  0  . traZODone (DESYREL) 100 MG tablet Take 100 mg by mouth at bedtime.       No current facility-administered medications for this visit.    Allergies:    Allergies  Allergen Reactions  . Amlodipine Besylate     REACTION: dizzy  . Codeine Phosphate     headache  . Lipitor [Atorvastatin Calcium]     Muscle pain and tiredness  . Sulfonamide Derivatives     REACTION: unspecified    Social History:  The patient  reports  that she has never smoked. She has never used smokeless tobacco. She reports that she does not drink alcohol or use illicit drugs.   ROS:  Please see the history of present illness.    Positive for abdominal pain, dizziness, easy bruising, constipation, anxiety, walking issues, skipping heartbeats, chest pressure.  All other systems reviewed and negative.   PHYSICAL EXAM: VS:  BP 132/86 mmHg  Pulse 60  Ht 5\' 3"  (1.6 m)  Wt 158 lb (71.668 kg)  BMI 28.00 kg/m2 Well nourished, well developed, in no acute distress HEENT: normal Neck: no JVD Cardiac:  normal S1, S2; RRR; no murmur Lungs:  clear to auscultation bilaterally, no wheezing, rhonchi or rales Abd: soft, nontender, no hepatomegaly Ext: no edema Skin: warm and dry Neuro: no focal abnormalities noted  EKG:  Sinus rhythm left anterior fascicular block, PAC. No evidence of atrial fibrillation.     ASSESSMENT AND PLAN:  1. Paroxysmal atrial fibrillation-previously expressed the importance  of anticoagulation. She has been informed about stroke risk. CHADS-VAS -3. She states that she worked so hard to get off of warfarin previously. I explained the benefits of the NOAC. She clearly refuses to take anticoagulation. If she changes her mind, she can always contact me. Continue with flecainide for now. She has not been taking her low-dose diltiazem either as previously prescribed as 120 mg. She states that she has been pill bottle if her heart rate increases. 2. Hypertension-normal today. She showed me a list of blood pressures similar which are at times quite elevated. She does not like taking triamterene hydrochlorothiazide, makes her feel sluggish she states. She also stopped taking her losartan. She is currently taking the metoprolol. Encouraged low sodium diet. Exercise. Medication.  3. Hyperlipidemia-currently taking fish oil. LDL cholesterol was 211 on 09/30/12. Strongly encouraged statin therapy. Not interested currently. Understands risks, myocardial infarction, stroke. Likely familial hyperlipidemia. 4. Chronic kidney disease stage III-GFR in the 50 range. Likely secondary to hypertension. She was quite concerned about this. Discussed the importance of controlling blood pressure. Exercise. Avoiding NSAIDs. 5. We will see back in 6 months.  Signed, Donato SchultzMark Skains, MD [facility]  06/22/2014 10:19 AM

## 2014-06-22 NOTE — Patient Instructions (Signed)
Medication Instructions:  Your physician recommends that you continue on your current medications as directed. Please refer to the Current Medication list given to you today.  Follow-Up: Follow up in 6 months with Dr. Skains.  You will receive a letter in the mail 2 months before you are due.  Please call us when you receive this letter to schedule your follow up appointment.  Thank you for choosing  [facility]!!     

## 2014-06-24 ENCOUNTER — Other Ambulatory Visit: Payer: Self-pay

## 2014-06-24 DIAGNOSIS — Z1231 Encounter for screening mammogram for malignant neoplasm of breast: Secondary | ICD-10-CM

## 2014-06-26 ENCOUNTER — Ambulatory Visit
Admission: RE | Admit: 2014-06-26 | Discharge: 2014-06-26 | Disposition: A | Discharge status: Home / Self Care | Payer: Medicare Other | Source: Ambulatory Visit

## 2014-06-26 DIAGNOSIS — Z1231 Encounter for screening mammogram for malignant neoplasm of breast: Secondary | ICD-10-CM

## 2014-11-19 ENCOUNTER — Other Ambulatory Visit: Payer: Self-pay | Admitting: Cardiology

## 2015-01-04 ENCOUNTER — Ambulatory Visit: Payer: Medicare Other | Admitting: Cardiology

## 2015-01-28 ENCOUNTER — Encounter (HOSPITAL_COMMUNITY): Payer: Self-pay | Admitting: *Deleted

## 2015-01-28 ENCOUNTER — Emergency Department (HOSPITAL_COMMUNITY): Payer: Medicare Other

## 2015-01-28 ENCOUNTER — Emergency Department (HOSPITAL_COMMUNITY)
Admission: EM | Admit: 2015-01-28 | Discharge: 2015-01-28 | Disposition: A | Discharge status: Home / Self Care | Payer: Medicare Other | Attending: Emergency Medicine | Admitting: Emergency Medicine

## 2015-01-28 DIAGNOSIS — G44209 Tension-type headache, unspecified, not intractable: Secondary | ICD-10-CM | POA: Diagnosis not present

## 2015-01-28 DIAGNOSIS — I10 Essential (primary) hypertension: Secondary | ICD-10-CM | POA: Diagnosis not present

## 2015-01-28 DIAGNOSIS — J3489 Other specified disorders of nose and nasal sinuses: Secondary | ICD-10-CM | POA: Insufficient documentation

## 2015-01-28 DIAGNOSIS — R112 Nausea with vomiting, unspecified: Secondary | ICD-10-CM | POA: Insufficient documentation

## 2015-01-28 DIAGNOSIS — Z79899 Other long term (current) drug therapy: Secondary | ICD-10-CM | POA: Insufficient documentation

## 2015-01-28 DIAGNOSIS — I480 Paroxysmal atrial fibrillation: Secondary | ICD-10-CM | POA: Insufficient documentation

## 2015-01-28 DIAGNOSIS — M1990 Unspecified osteoarthritis, unspecified site: Secondary | ICD-10-CM | POA: Diagnosis not present

## 2015-01-28 DIAGNOSIS — Z7982 Long term (current) use of aspirin: Secondary | ICD-10-CM | POA: Insufficient documentation

## 2015-01-28 DIAGNOSIS — E669 Obesity, unspecified: Secondary | ICD-10-CM | POA: Insufficient documentation

## 2015-01-28 DIAGNOSIS — K219 Gastro-esophageal reflux disease without esophagitis: Secondary | ICD-10-CM | POA: Diagnosis not present

## 2015-01-28 DIAGNOSIS — I2510 Atherosclerotic heart disease of native coronary artery without angina pectoris: Secondary | ICD-10-CM | POA: Insufficient documentation

## 2015-01-28 DIAGNOSIS — Z8701 Personal history of pneumonia (recurrent): Secondary | ICD-10-CM | POA: Diagnosis not present

## 2015-01-28 DIAGNOSIS — R197 Diarrhea, unspecified: Secondary | ICD-10-CM | POA: Insufficient documentation

## 2015-01-28 DIAGNOSIS — E785 Hyperlipidemia, unspecified: Secondary | ICD-10-CM | POA: Insufficient documentation

## 2015-01-28 DIAGNOSIS — R509 Fever, unspecified: Secondary | ICD-10-CM | POA: Diagnosis not present

## 2015-01-28 DIAGNOSIS — R51 Headache: Secondary | ICD-10-CM | POA: Diagnosis present

## 2015-01-28 LAB — URINALYSIS, ROUTINE W REFLEX MICROSCOPIC
Bilirubin Urine: NEGATIVE
GLUCOSE, UA: NEGATIVE mg/dL
KETONES UR: NEGATIVE mg/dL
Leukocytes, UA: NEGATIVE
Nitrite: NEGATIVE
Protein, ur: 100 mg/dL — AB
Specific Gravity, Urine: 1.009 (ref 1.005–1.030)
pH: 8 (ref 5.0–8.0)

## 2015-01-28 LAB — CBC WITH DIFFERENTIAL/PLATELET
BASOS PCT: 0 %
Basophils Absolute: 0 10*3/uL (ref 0.0–0.1)
Eosinophils Absolute: 0 10*3/uL (ref 0.0–0.7)
Eosinophils Relative: 0 %
HCT: 38.8 % (ref 36.0–46.0)
HEMOGLOBIN: 13.2 g/dL (ref 12.0–15.0)
Lymphocytes Relative: 13 %
Lymphs Abs: 0.6 10*3/uL — ABNORMAL LOW (ref 0.7–4.0)
MCH: 31.3 pg (ref 26.0–34.0)
MCHC: 34 g/dL (ref 30.0–36.0)
MCV: 91.9 fL (ref 78.0–100.0)
Monocytes Absolute: 0.2 10*3/uL (ref 0.1–1.0)
Monocytes Relative: 5 %
NEUTROS PCT: 82 %
Neutro Abs: 3.6 10*3/uL (ref 1.7–7.7)
Platelets: 239 10*3/uL (ref 150–400)
RBC: 4.22 MIL/uL (ref 3.87–5.11)
RDW: 12.3 % (ref 11.5–15.5)
WBC: 4.4 10*3/uL (ref 4.0–10.5)

## 2015-01-28 LAB — URINE MICROSCOPIC-ADD ON: WBC, UA: NONE SEEN WBC/hpf (ref 0–5)

## 2015-01-28 LAB — COMPREHENSIVE METABOLIC PANEL
ALBUMIN: 3.8 g/dL (ref 3.5–5.0)
ALT: 16 U/L (ref 14–54)
AST: 25 U/L (ref 15–41)
Alkaline Phosphatase: 98 U/L (ref 38–126)
Anion gap: 12 (ref 5–15)
BILIRUBIN TOTAL: 0.6 mg/dL (ref 0.3–1.2)
BUN: 16 mg/dL (ref 6–20)
CO2: 25 mmol/L (ref 22–32)
Calcium: 10.1 mg/dL (ref 8.9–10.3)
Chloride: 96 mmol/L — ABNORMAL LOW (ref 101–111)
Creatinine, Ser: 1.12 mg/dL — ABNORMAL HIGH (ref 0.44–1.00)
GFR calc Af Amer: 53 mL/min — ABNORMAL LOW (ref >60)
GFR calc non Af Amer: 46 mL/min — ABNORMAL LOW (ref >60)
Glucose, Bld: 170 mg/dL — ABNORMAL HIGH (ref 65–99)
Potassium: 3.9 mmol/L (ref 3.5–5.1)
Sodium: 133 mmol/L — ABNORMAL LOW (ref 135–145)
TOTAL PROTEIN: 6.6 g/dL (ref 6.5–8.1)

## 2015-01-28 LAB — LIPASE, BLOOD: Lipase: 20 U/L (ref 11–51)

## 2015-01-28 LAB — I-STAT TROPONIN, ED: Troponin i, poc: 0.02 ng/mL (ref 0.00–0.08)

## 2015-01-28 MED ORDER — MORPHINE SULFATE (PF) 4 MG/ML IV SOLN
4.0000 mg | Freq: Once | INTRAVENOUS | Status: AC
  Administered 2015-01-28: 4 mg via INTRAVENOUS
  Filled 2015-01-28: qty 1

## 2015-01-28 MED ORDER — METOCLOPRAMIDE HCL 5 MG/ML IJ SOLN
10.0000 mg | Freq: Once | INTRAMUSCULAR | Status: AC
  Administered 2015-01-28: 10 mg via INTRAVENOUS
  Filled 2015-01-28: qty 2

## 2015-01-28 MED ORDER — ONDANSETRON HCL 4 MG PO TABS: 4.0000 mg | ORAL_TABLET | Freq: Three times a day (TID) | ORAL | Status: DC | PRN

## 2015-01-28 MED ORDER — ONDANSETRON 4 MG PO TBDP
8.0000 mg | ORAL_TABLET | Freq: Once | ORAL | Status: AC
  Administered 2015-01-28: 8 mg via ORAL
  Filled 2015-01-28: qty 2

## 2015-01-28 MED ORDER — SODIUM CHLORIDE 0.9 % IV BOLUS (SEPSIS)
1000.0000 mL | Freq: Once | INTRAVENOUS | Status: AC
  Administered 2015-01-28: 1000 mL via INTRAVENOUS

## 2015-01-28 NOTE — ED Notes (Signed)
Patient vomiting after standing.  Patient states that she feels a little bit better after vomiting.

## 2015-01-28 NOTE — ED Notes (Signed)
Patient sleeping at this time.  Husband leaving to get clothing for patient.

## 2015-01-28 NOTE — ED Provider Notes (Signed)
CSN: [id number]     Arrival date & time 01/28/15  0125 History   By signing my name below, I, Arlan Organshley Leger, attest that this documentation has been prepared under the direction and in the presence of Lavera Guiseana Duo Carrah Eppolito, MD.  Electronically Signed: [name], ED Scribe. 01/28/2015. 3:01 AM.   Chief Complaint  Patient presents with  . Headache  . Emesis   The history is provided by the patient. No language interpreter was used.    HPI Comments: Cheryl [name] brought in by EMS from home is a 80 y.o. female with a PMHx of HTN, hyperlipidemia, and CAD who presents to the Emergency Department complaining of intermittent, ongoing vomiting with associated nausea onset earlier this afternoon. She reports 3 episodes of vomiting followed by episodes of dry heaving. Pt states "i just haven't felt well today". She has ongoing diarrhea, congestion, rhinorrhea, and low grade temperature also reported. Pt states diarrhea woke her from sleep this morning. No blood noted in stools or vomit.   At approximately 2:00 PM this afternoon [name] states she developed a slowly progressively worsening HA described as "a tight band around my head and temples". She admits to a history of Migraines as a young adult but states current HA feels different. Currently HA is rated 9/10. No confusion, numbness, weakness, slurred speech, or double vision reported. She denies any chest pain or shortness of breath.  Pt received a Cortisone injection in her spine this morning at approximately 10:00 AM which is routine for her. However, she states the injection was given by a different doctor this morning and reports significant pain during procedure. Injection to nerve root and not into epidural space.  PCP: Cala BradfordWHITE,CYNTHIA S, MD    Past Medical History  Diagnosis Date  . Hypertension   . Hyperlipidemia   . PAF (paroxysmal atrial fibrillation) (HCC)   . Obesity   . History of heartburn   . High risk medication use      Flecainide therapy  . Syncope July 2012  . Normal nuclear stress test July 2012  . LVH (left ventricular hypertrophy)     Per echo in July 2012  . Diastolic dysfunction     Grade I, per echo in 2012  . CAD (coronary artery disease)     Mild per remote cath in 2004; normal nuclear in 2012  . Complication of anesthesia     slow to wake up  . Dysrhythmia   . Pneumonia     20 yrs ago  . GERD (gastroesophageal reflux disease)     prevacid  . H/O hiatal hernia   . Headache(784.0)     sinus  . Arthritis    Past Surgical History  Procedure Laterality Date  . Cardiac catheterization  07/28/2002    EF GREATER THAN 55%; MILD CAD  . Back surgery  1980, 2010    x2  . Tonsillectomy      age 29 yrs  . Appendectomy  1952  . Tubal ligation    . Dilation and curettage of uterus    . Colonoscopy     Family History  Problem Relation Age of Onset  . Lung cancer Father    Social History  Substance Use Topics  . Smoking status: Never Smoker   . Smokeless tobacco: Never Used  . Alcohol Use: No   OB History    No data available     Review of Systems  Constitutional: Positive for fever. Negative for chills.  HENT: Positive for congestion and rhinorrhea.   Respiratory: Negative for cough and shortness of breath.   Cardiovascular: Negative for chest pain.  Gastrointestinal: Positive for nausea, vomiting and diarrhea. Negative for abdominal pain.  Neurological: Positive for headaches. Negative for weakness and numbness.  Psychiatric/Behavioral: Negative for confusion.  All other systems reviewed and are negative.     Allergies  Amlodipine besylate; Codeine phosphate; Lipitor; and Sulfonamide derivatives  Home Medications   Prior to Admission medications   Medication Sig Start Date End Date Taking? Authorizing Provider  aspirin EC 81 MG tablet Take 81 mg by mouth daily.   Yes Historical Provider, MD  B Complex-C (B-COMPLEX WITH VITAMIN C) tablet Take 1 tablet by mouth daily.    Yes Historical Provider, MD  Biotin 10 MG CAPS Take 10 mg by mouth daily.   Yes Historical Provider, MD  Cholecalciferol (VITAMIN D) 2000 UNITS tablet Take 2,000 Units by mouth daily.     Yes Historical Provider, MD  flecainide (TAMBOCOR) 50 MG tablet take 1 tablet by mouth once daily 11/20/14  Yes Jake Bathe, MD  loratadine (CLARITIN) 10 MG tablet Take 10 mg by mouth daily as needed for allergies.   Yes Historical Provider, MD  metoprolol succinate (TOPROL-XL) 25 MG 24 hr tablet Take 25 mg by mouth daily. 05/28/14  Yes Historical Provider, MD  Multiple Vitamin (MULTIVITAMIN WITH MINERALS) TABS tablet Take 1 tablet by mouth daily.   Yes Historical Provider, MD  traZODone (DESYREL) 100 MG tablet Take 100 mg by mouth at bedtime.     Yes Historical Provider, MD  ondansetron (ZOFRAN) 4 MG tablet Take 1 tablet (4 mg total) by mouth every 8 (eight) hours as needed for nausea or vomiting. 01/28/15   Lavera Guise, MD   Triage Vitals: BP 191/83 mmHg  Pulse 64  Temp(Src) 98.2 F (36.8 C) (Oral)  Resp 15  Ht  (1.6 m)  Wt 160 lb (72.576 kg)  BMI 28.35 kg/m2  SpO2 96%   Physical Exam  Constitutional: She is oriented to person, place, and time. She appears well-developed and well-nourished.  HENT:  Head: Normocephalic and atraumatic.  Dry mucous membranes but oropharynx is clear  Eyes: Conjunctivae and EOM are normal. Pupils are equal, round, and reactive to light.  Neck: Normal range of motion. Neck supple.  Cardiovascular: Normal rate, regular rhythm and normal heart sounds.   Pulmonary/Chest: Effort normal and breath sounds normal. No respiratory distress. She has no wheezes. She has no rales.  Abdominal: Soft. Bowel sounds are normal. She exhibits no distension. There is no tenderness.  Musculoskeletal: Normal range of motion.  Normal appearing TSL spine without overlying skin changes  Neurological: She is alert and oriented to person, place, and time.  Full strength noted to bilateral  upper strength against gravity  Sensation intact to light touch in all 4 extremities and to face No facial droop noted Palate activates symmetrically Full strength in trapezius and SCM muscles bilaterally  No dysmetria with finger to nose test bilaterally   Skin: Skin is warm and dry.  Psychiatric: She has a normal mood and affect. Judgment normal.  Nursing note and vitals reviewed.   ED Course  Procedures (including critical care time)  DIAGNOSTIC STUDIES: Oxygen Saturation is 95% on RA, adequate by my interpretation.    COORDINATION OF CARE: 1:47 AM- Will give fluids, Reglan, and Morphine. Will order CT head without contrast, lipase, urinalysis, i-stat troponin, CBC, CMP, and EKG. Discussed treatment plan with  pt at bedside and pt agreed to plan.     Labs Review Labs Reviewed  CBC WITH DIFFERENTIAL/PLATELET - Abnormal; Notable for the following:    Lymphs Abs 0.6 (*)    All other components within normal limits  COMPREHENSIVE METABOLIC PANEL - Abnormal; Notable for the following:    Sodium 133 (*)    Chloride 96 (*)    Glucose, Bld 170 (*)    Creatinine, Ser 1.12 (*)    GFR calc non Af Amer 46 (*)    GFR calc Af Amer 53 (*)    All other components within normal limits  URINALYSIS, ROUTINE W REFLEX MICROSCOPIC (NOT AT [facility]) - Abnormal; Notable for the following:    APPearance CLOUDY (*)    Hgb urine dipstick TRACE (*)    Protein, ur 100 (*)    All other components within normal limits  URINE MICROSCOPIC-ADD ON - Abnormal; Notable for the following:    Squamous Epithelial / LPF 0-5 (*)    Bacteria, UA RARE (*)    All other components within normal limits  LIPASE, BLOOD  I-STAT TROPOININ, ED    Imaging Review Ct Head Wo Contrast  01/28/2015  CLINICAL DATA:  80 year old female with headache nausea vomiting EXAM: CT HEAD WITHOUT CONTRAST TECHNIQUE: Contiguous axial images were obtained from the base of the skull through the vertex without intravenous contrast.  COMPARISON:  None. FINDINGS: There is slight prominence of the ventricles and sulci compatible with age-related volume loss. Periventricular and deep white matter hypodensities represent chronic microvascular ischemic changes. There is no intracranial hemorrhage. No mass effect or midline shift identified. The visualized paranasal sinuses and mastoid air cells are well aerated. The calvarium is intact. IMPRESSION: No acute intracranial hemorrhage. Mild age-related atrophy and chronic microvascular ischemic disease. If symptoms persist and there are no contraindications, MRI may provide better evaluation if clinically indicated Electronically Signed   By: Elgie Collard M.D.   On: 01/28/2015 02:55   I have personally reviewed and evaluated these images and lab results as part of my medical decision-making.   EKG Interpretation   Date/Time:  Thursday January 28 2015 01:51:59 EST Ventricular Rate:  81 PR Interval:  218 QRS Duration: 104 QT Interval:  429 QTC Calculation: 498 R Axis:   -53 Text Interpretation:  Sinus rhythm Borderline prolonged PR interval Left  anterior fascicular block Probable left ventricular hypertrophy Anterior Q  waves, possibly due to LVH Borderline T abnormalities, inferior leads No  significant change since last tracing Confirmed by Tereka Thorley MD, Leana Springston ([id number]) on  01/28/2015 3:29:21 AM      MDM   Final diagnoses:  Nausea vomiting and diarrhea  Tension-type headache, not intractable, unspecified chronicity pattern    In short, this is a 80 year old female who presents with nausea, vomiting, diarrhea, and headache for 1 day. She is nontoxic and in no acute distress, but does appear dehydrated and tired on exam. She is neurologically intact. Abdomen is soft and nontender. Overall presentation seems consistent with likely benign GI illness. Her headache as seems tension-like in nature and more likely due to dehydration. A CT head was performed given her age and vomiting,  and this is visualized. No acute intracranial process. Basic blood work reveals a mild hyponatremia and mild AK I consistent with likely volume depletion with diarrhea and vomiting. He is given IV fluids and anti-emetics with relief in her symptoms. She continues to feel tired, but feels that she is able to tolerate by mouth intake  here. Her abdomen remains benign and nontender. I do not suspect serious acute intra-abdominal process. Headache is not of sudden onset maximal intensity to suggest evidence of subarachnoid hemorrhage. Also no neuro deficits or complaints to suggest stroke. No meningismus, fever, or concern for intracranial infections. She has a nonischemic EKG and negative troponin, and I do not believe this is an atypical presentation for ACS. I feel that she is stable and appropriate for management of her GI illness at home. Is given a course of Zofran. Strict return and follow-up instructions are reviewed. She expressed understanding of all discharge instructions and was comfortable with the plan of care.  I personally performed the services described in this documentation, which was scribed in my presence. The recorded information has been reviewed and is accurate.   Lavera Guise, MD 01/28/15 [phone]

## 2015-01-28 NOTE — ED Notes (Signed)
Patient awake, having some sprite to drink at this time.

## 2015-01-28 NOTE — ED Notes (Signed)
The pts headache is a little better

## 2015-01-28 NOTE — ED Notes (Signed)
To x-ray

## 2015-01-28 NOTE — ED Notes (Signed)
The pt arrived by [facility] from home.  The pt woke up thgis am with diarrhea.. She had a cortisone injection in her back this afternoon.  approx 1400 she developed a headache with n and v  The shot hurt her more than usual.  At presenr she still has a headache and she is nauseated.  Iv per ems   She was given zofran by ems iv.  She is still nauseated

## 2015-01-28 NOTE — Discharge Instructions (Signed)
Return without fail for worsening symptoms, including fever, confusion, vomiting and unable to keep down food/fluids, worsening pain, or any other symptoms concerning to you.  Nausea and Vomiting Nausea means you feel sick to your stomach. Throwing up (vomiting) is a reflex where stomach contents come out of your mouth. HOME CARE   Take medicine as told by your doctor.  Do not force yourself to eat. However, you do need to drink fluids.  If you feel like eating, eat a normal diet as told by your doctor.  Eat rice, wheat, potatoes, bread, lean meats, yogurt, fruits, and vegetables.  Avoid high-fat foods.  Drink enough fluids to keep your pee (urine) clear or pale yellow.  Ask your doctor how to replace body fluid losses (rehydrate). Signs of body fluid loss (dehydration) include:  Feeling very thirsty.  Dry lips and mouth.  Feeling dizzy.  Dark pee.  Peeing less than normal.  Feeling confused.  Fast breathing or heart rate. GET HELP RIGHT AWAY IF:   You have blood in your throw up.  You have black or bloody poop (stool).  You have a bad headache or stiff neck.  You feel confused.  You have bad belly (abdominal) pain.  You have chest pain or trouble breathing.  You do not pee at least once every 8 hours.  You have cold, clammy skin.  You keep throwing up after 24 to 48 hours.  You have a fever. MAKE SURE YOU:   Understand these instructions.  Will watch your condition.  Will get help right away if you are not doing well or get worse.   This information is not intended to replace advice given to you by your health care provider. Make sure you discuss any questions you have with your health care provider.   Document Released: 07/12/2007 Document Revised: 04/17/2011 Document Reviewed: 06/24/2010 Elsevier Interactive Patient Education 2016 Elsevier Inc.  Diarrhea Diarrhea is frequent loose and watery bowel movements. It can cause you to feel weak and  dehydrated. Dehydration can cause you to become tired and thirsty, have a dry mouth, and have decreased urination that often is dark yellow. Diarrhea is a sign of another problem, most often an infection that will not last long. In most cases, diarrhea typically lasts 2-3 days. However, it can last longer if it is a sign of something more serious. It is important to treat your diarrhea as directed by your caregiver to lessen or prevent future episodes of diarrhea. CAUSES  Some common causes include:  Gastrointestinal infections caused by viruses, bacteria, or parasites.  Food poisoning or food allergies.  Certain medicines, such as antibiotics, chemotherapy, and laxatives.  Artificial sweeteners and fructose.  Digestive disorders. HOME CARE INSTRUCTIONS  Ensure adequate fluid intake (hydration): Have 1 cup (8 oz) of fluid for each diarrhea episode. Avoid fluids that contain simple sugars or sports drinks, fruit juices, whole milk products, and sodas. Your urine should be clear or pale yellow if you are drinking enough fluids. Hydrate with an oral rehydration solution that you can purchase at pharmacies, retail stores, and online. You can prepare an oral rehydration solution at home by mixing the following ingredients together:   - tsp table salt.   tsp baking soda.   tsp salt substitute containing potassium chloride.  1  tablespoons sugar.  1 L (34 oz) of water.  Certain foods and beverages may increase the speed at which food moves through the gastrointestinal (GI) tract. These foods and beverages should be  avoided and include:  Caffeinated and alcoholic beverages.  High-fiber foods, such as raw fruits and vegetables, nuts, seeds, and whole grain breads and cereals.  Foods and beverages sweetened with sugar alcohols, such as xylitol, sorbitol, and mannitol.  Some foods may be well tolerated and may help thicken stool including:  Starchy foods, such as rice, toast, pasta,  low-sugar cereal, oatmeal, grits, baked potatoes, crackers, and bagels.  Bananas.  Applesauce.  Add probiotic-rich foods to help increase healthy bacteria in the GI tract, such as yogurt and fermented milk products.  Wash your hands well after each diarrhea episode.  Only take over-the-counter or prescription medicines as directed by your caregiver.  Take a warm bath to relieve any burning or pain from frequent diarrhea episodes. SEEK IMMEDIATE MEDICAL CARE IF:   You are unable to keep fluids down.  You have persistent vomiting.  You have blood in your stool, or your stools are black and tarry.  You do not urinate in 6-8 hours, or there is only a small amount of very dark urine.  You have abdominal pain that increases or localizes.  You have weakness, dizziness, confusion, or light-headedness.  You have a severe headache.  Your diarrhea gets worse or does not get better.  You have a fever or persistent symptoms for more than 2-3 days.  You have a fever and your symptoms suddenly get worse. MAKE SURE YOU:   Understand these instructions.  Will watch your condition.  Will get help right away if you are not doing well or get worse.   This information is not intended to replace advice given to you by your health care provider. Make sure you discuss any questions you have with your health care provider.   Document Released: 01/13/2002 Document Revised: 02/13/2014 Document Reviewed: 10/01/2011 Elsevier Interactive Patient Education 2016 Elsevier Inc.  General Headache Without Cause A headache is pain or discomfort felt around the head or neck area. There are many causes and types of headaches. In some cases, the cause may not be found.  HOME CARE  Managing Pain  Take over-the-counter and prescription medicines only as told by your doctor.  Lie down in a dark, quiet room when you have a headache.  If directed, apply ice to the head and neck area:  Put ice in a  plastic bag.  Place a towel between your skin and the bag.  Leave the ice on for 20 minutes, 2-3 times per day.  Use a heating pad or hot shower to apply heat to the head and neck area as told by your doctor.  Keep lights dim if bright lights bother you or make your headaches worse. Eating and Drinking  Eat meals on a regular schedule.  Lessen how much alcohol you drink.  Lessen how much caffeine you drink, or stop drinking caffeine. General Instructions  Keep all follow-up visits as told by your doctor. This is important.  Keep a journal to find out if certain things bring on headaches. For example, write down:  What you eat and drink.  How much sleep you get.  Any change to your diet or medicines.  Relax by getting a massage or doing other relaxing activities.  Lessen stress.  Sit up straight. Do not tighten (tense) your muscles.  Do not use tobacco products. This includes cigarettes, chewing tobacco, or e-cigarettes. If you need help quitting, ask your doctor.  Exercise regularly as told by your doctor.  Get enough sleep. This often means 7-9  hours of sleep. GET HELP IF:  Your symptoms are not helped by medicine.  You have a headache that feels different than the other headaches.  You feel sick to your stomach (nauseous) or you throw up (vomit).  You have a fever. GET HELP RIGHT AWAY IF:   Your headache becomes really bad.  You keep throwing up.  You have a stiff neck.  You have trouble seeing.  You have trouble speaking.  You have pain in the eye or ear.  Your muscles are weak or you lose muscle control.  You lose your balance or have trouble walking.  You feel like you will pass out (faint) or you pass out.  You have confusion.   This information is not intended to replace advice given to you by your health care provider. Make sure you discuss any questions you have with your health care provider.   Document Released: 11/02/2007 Document  Revised: 10/14/2014 Document Reviewed: 05/18/2014 Elsevier Interactive Patient Education [employer]

## 2015-01-28 NOTE — ED Notes (Signed)
Patient still feeling as if her stomach is not settled.  She states she will try liquids in a little while.

## 2015-02-03 ENCOUNTER — Other Ambulatory Visit: Payer: Self-pay | Admitting: Neurosurgery

## 2015-02-05 ENCOUNTER — Encounter (HOSPITAL_COMMUNITY)
Admission: RE | Admit: 2015-02-05 | Discharge: 2015-02-05 | Disposition: A | Discharge status: Home / Self Care | Payer: Medicare Other | Source: Ambulatory Visit | Attending: Neurosurgery | Admitting: Neurosurgery

## 2015-02-05 ENCOUNTER — Encounter (HOSPITAL_COMMUNITY): Payer: Self-pay

## 2015-02-05 DIAGNOSIS — I2510 Atherosclerotic heart disease of native coronary artery without angina pectoris: Secondary | ICD-10-CM | POA: Insufficient documentation

## 2015-02-05 DIAGNOSIS — Z01812 Encounter for preprocedural laboratory examination: Secondary | ICD-10-CM | POA: Diagnosis not present

## 2015-02-05 DIAGNOSIS — N183 Chronic kidney disease, stage 3 (moderate): Secondary | ICD-10-CM | POA: Insufficient documentation

## 2015-02-05 DIAGNOSIS — E785 Hyperlipidemia, unspecified: Secondary | ICD-10-CM | POA: Insufficient documentation

## 2015-02-05 DIAGNOSIS — I129 Hypertensive chronic kidney disease with stage 1 through stage 4 chronic kidney disease, or unspecified chronic kidney disease: Secondary | ICD-10-CM | POA: Diagnosis not present

## 2015-02-05 DIAGNOSIS — M5126 Other intervertebral disc displacement, lumbar region: Secondary | ICD-10-CM | POA: Insufficient documentation

## 2015-02-05 DIAGNOSIS — Z7982 Long term (current) use of aspirin: Secondary | ICD-10-CM | POA: Diagnosis not present

## 2015-02-05 DIAGNOSIS — K219 Gastro-esophageal reflux disease without esophagitis: Secondary | ICD-10-CM | POA: Insufficient documentation

## 2015-02-05 DIAGNOSIS — I480 Paroxysmal atrial fibrillation: Secondary | ICD-10-CM | POA: Diagnosis not present

## 2015-02-05 DIAGNOSIS — Z01818 Encounter for other preprocedural examination: Secondary | ICD-10-CM | POA: Diagnosis not present

## 2015-02-05 DIAGNOSIS — Z79899 Other long term (current) drug therapy: Secondary | ICD-10-CM | POA: Insufficient documentation

## 2015-02-05 HISTORY — DX: Chronic kidney disease, unspecified: N18.9

## 2015-02-05 LAB — SURGICAL PCR SCREEN
MRSA, PCR: NEGATIVE
STAPHYLOCOCCUS AUREUS: NEGATIVE

## 2015-02-05 LAB — CBC
HEMATOCRIT: 36.3 % (ref 36.0–46.0)
HEMOGLOBIN: 12.1 g/dL (ref 12.0–15.0)
MCH: 31.1 pg (ref 26.0–34.0)
MCHC: 33.3 g/dL (ref 30.0–36.0)
MCV: 93.3 fL (ref 78.0–100.0)
Platelets: 214 10*3/uL (ref 150–400)
RBC: 3.89 MIL/uL (ref 3.87–5.11)
RDW: 12.5 % (ref 11.5–15.5)
WBC: 5.4 10*3/uL (ref 4.0–10.5)

## 2015-02-05 LAB — BASIC METABOLIC PANEL
ANION GAP: 9 (ref 5–15)
BUN: 20 mg/dL (ref 6–20)
CALCIUM: 10.1 mg/dL (ref 8.9–10.3)
CO2: 27 mmol/L (ref 22–32)
Chloride: 101 mmol/L (ref 101–111)
Creatinine, Ser: 1.2 mg/dL — ABNORMAL HIGH (ref 0.44–1.00)
GFR calc non Af Amer: 42 mL/min — ABNORMAL LOW (ref >60)
GFR, EST AFRICAN AMERICAN: 49 mL/min — AB (ref >60)
GLUCOSE: 105 mg/dL — AB (ref 65–99)
POTASSIUM: 4.3 mmol/L (ref 3.5–5.1)
Sodium: 137 mmol/L (ref 135–145)

## 2015-02-05 NOTE — Progress Notes (Signed)
PCP is Dr  Laurann Montanaynthia White Cardiologist is Dr Anne FuSkains Last Card cath 2004 Last echo and stress test noted in epic from 08-30-10 Ekg noted in epic from 01-28-15

## 2015-02-05 NOTE — Pre-Procedure Instructions (Signed)
Cheryl [name]  02/05/2015      [facility] [name] - [address] [place] [address] Phone: [phone] Fax: [phone]    Your procedure is scheduled on Jan 9  [facility] Admitting at [phone].  Call this number if you have problems the morning of surgery:  [phone]   Remember:  Do not eat food or drink liquids after midnight.  Take these medicines the morning of surgery with A SIP OF WATER loratadine (Claritin) if needed, flecainide (Tambocor), Metoprolol succinate(Toprol-XL), Ondansetron (Zofran) if needed  Stop taking aspirin, ibuprofen, BC's, Goody's, Advil, Aleve, Herbal medications, Fish Oil, Vitamins   Do not wear jewelry, make-up or nail polish.  Do not wear lotions, powders, or perfumes.  You may wear deodorant.  Do not shave 48 hours prior to surgery.  Men may shave face and neck.  Do not bring valuables to the hospital.  [facility] is not responsible for any belongings or valuables.  Contacts, dentures or bridgework may not be worn into surgery.  Leave your suitcase in the car.  After surgery it may be brought to your room.  For patients admitted to the hospital, discharge time will be determined by your treatment team.  Patients discharged the day of surgery will not be allowed to drive home.   Special instructions:  [facility] - Preparing for Surgery  Before surgery, you can play an important role.  Because skin is not sterile, your skin needs to be as free of germs as possible.  You can reduce the number of germs on you skin by washing with CHG (chlorahexidine gluconate) soap before surgery.  CHG is an antiseptic cleaner which kills germs and bonds with the skin to continue killing germs even after washing.  Please DO NOT use if you have an allergy to CHG or antibacterial soaps.  If your skin becomes reddened/irritated stop using the CHG and inform your nurse when you  arrive at Short Stay.  Do not shave (including legs and underarms) for at least 48 hours prior to the first CHG shower.  You may shave your face.  Please follow these instructions carefully:   1.  Shower with CHG Soap the night before surgery and the   morning of Surgery.  2.  If you choose to wash your hair, wash your hair first as usual with your  normal shampoo.  3.  After you shampoo, rinse your hair and body thoroughly to remove the  Shampoo.  4.  Use CHG as you would any other liquid soap.  You can apply chg directly   to the skin and wash gently with scrungie or a clean washcloth.  5.  Apply the CHG Soap to your body ONLY FROM THE NECK DOWN.   Do not use on open wounds or open sores.  Avoid contact with your eyes,  ears, mouth and genitals (private parts).  Wash genitals (private parts) with your normal soap.  6.  Wash thoroughly, paying special attention to the area where your surgery  will be performed.  7.  Thoroughly rinse your body with warm water from the neck down.  8.  DO NOT shower/wash with your normal soap after using and rinsing off  the CHG Soap.  9.  Pat yourself dry with a clean towel.            10.  Wear clean pajamas.  11.  Place clean sheets on your bed the night of your first shower and do not        sleep with pets.  Day of Surgery  Do not apply any lotions/deoderants the morning of surgery.  Please wear clean clothes to the hospital/surgery center.     Please read over the following fact sheets that you were given. Pain Booklet, Coughing and Deep Breathing, MRSA Information and Surgical Site Infection Prevention

## 2015-02-09 ENCOUNTER — Telehealth: Payer: Self-pay | Admitting: Cardiology

## 2015-02-09 NOTE — Telephone Encounter (Signed)
I spoke with the pt and made her aware that she will require an appointment to discuss surgical clearance.  The pt was upset that she had to come in for an appointment and she said that she is in a lot of pain and this is an inconvenience.  I made her aware that she requires cardiac clearance in order to proceed.  Appointment scheduled on 02/11/15.

## 2015-02-09 NOTE — Progress Notes (Addendum)
Anesthesia Chart Review:  Pt is 81 year old female scheduled for T12-L1 microdiscectomy on 02/15/2015 with Dr. Lovell SheehanJenkins.   Cardiologist is Dr. Donato SchultzMark Skains, last office visit 06/22/14.   PMH includes:  PAF, CAD (mild by 2004 cath), HTN, hyperlipidemia, syncope, CKD (stage 3), GERD. Never smoker. BMI 28. S/p posterior lumbar fusion 08/07/13.   Medications include: ASA, flecainide, metoprolol.   Preoperative labs reviewed.    EKG 01/28/15: Sinus rhythm. Borderline prolonged PR interval. LAFB. Probable LVH. Anterior Q waves, possibly due to LVH. Borderline T abnormalities, inferior leads  Nuclear stress test on 08/30/10 was normal, EF 67%.  Echo on 08/30/10 showed:  - Left ventricle: The cavity size was normal. Wall thickness was increased in a pattern of moderate LVH. Systolic function was normal. The estimated ejection fraction was in the range of 55% to 65%. Wall motion was normal; there were no regional wall motion abnormalities. Doppler parameters are consistent with abnormal left ventricular relaxation (grade 1 diastolic dysfunction). - Mitral valve: There was systolic anterior motion. Mild regurgitation. - Atrial septum: No defect or patent foramen ovale was identified. - Pulmonary arteries: PA peak pressure: 32mm Hg (S).  Cardiac cath in 07/28/02 showed: LM with moderate irregularities, 30-40% proximal LAD, distal LAD with moderate irregularity interburst mild disease of 30%. Left circumflex with moderate irregularities with 20-30%. 40-50% proximal RCA with the remainder the vessel with mild irregularities. Normal in systolic and end-diastolic dimensions. Overall left ventricular function is well-preserved, EF 55%. No mitral regurgitation.  Pt has appt with cardiology PA on 02/11/15 for pre-op clearance. Will revisit chart after that visit.   Rica Mastngela Annamae Shivley, FNP-BC [facility]/Anesthesiology Phone: [phone] 02/09/2015 3:47 PM  Addendum:  Pt saw Cline CrockKathryn Thompson, PA  with cardiology on 02/11/15 for pre-op eval. Pt is cleared for surgery from cardiology standpoint in Epic note dated 02/10/15.   If no changes, I anticipate pt can proceed with surgery as scheduled.   Rica Mastngela Artur Winningham, FNP-BC [facility]/Anesthesiology Phone: [phone] 02/12/2015 10:14 AM

## 2015-02-09 NOTE — Telephone Encounter (Signed)
Have her come in for surgical pre op appt. (APP OK if I am not available). We may need to set up stress test prior to back surgery.   Donato SchultzSKAINS, Cheryl Ayars, MD

## 2015-02-09 NOTE — Telephone Encounter (Signed)
Reviewed chart and the pt is scheduled for Right T12-L1 Microdiskectomy on 02/15/15 with Dr Tressie StalkerJeffrey Jenkins.  Surgical clearance requested and the pt is currently taking Aspirin 81mg  daily. I will forward this message to Dr Anne FuSkains to review and advise.

## 2015-02-09 NOTE — Telephone Encounter (Signed)
New message     Request for surgical clearance:  What type of surgery is being performed?  Back surgery When is this surgery scheduled? 02-15-15 Are there any medications that need to be held prior to surgery and how long? Need medical clearance Name of physician performing surgery? Dr Delma OfficerJeff Jenkins 1. What is your office phone and fax number? [phone]--------[phone]--------they faxed clearance weeks ago

## 2015-02-10 NOTE — Progress Notes (Signed)
Cardiology Office Note   Date:  02/11/2015   ID:  Cheryl [name], DOB 1934-04-28, MRN [id number]  PCP:  Cheryl Bradford, MD  Cardiologist:  Dr. Anne Fu   Pre-operative clearance.     History of Present Illness: Cheryl [name] is a 81 y.o. female with a history of HTN, probable familial HLD, PAF on Flecainide, CKD, non obst CAD (cath 2004) and GERD who presents to clinic for pre-operative clearance prior to right T12-L1 microdiskectomy.   In 2004 she was admitted to [facility] for chest pain. She underwent nuclear stress testing which returned with possible anterior wall ischemia. She underwent subsequent LHC which revealed mild, non obstructive CAD and normal LV function. She has history of PAF and was previously on Coumadin, but stopped this because she did not like the medication. She has a CHADSVASC score of at least 4 (HTN, AGE, F sex) however, she is not on long term anticoagulation despite extensive counseling on risk of stroke. She repeatedly refuses. She also likely has familial hyperlipidemia, with a LDL of 211 in 09/30/12; however, she also refuses statin medication. Her last nuclear stress test was in 2012 and was reportedly normal. She had a big back surgery in July 2015, fusion. She did well with this and had no recurrent afib.   She was last seen by Dr. Anne Fu in 06/2014. She was doing well- she takes an extra Flecainide for palpations. She also kept a blood pressure log that showed elevated BPs. She reported not taking her  triamterene hydrochlorothiazide, diltiazem or losartan. Dr. Anne Fu encouraged her to take her BP medications, especially in light of newly diagnosed CKD.  She was added onto my schedule today for pre-operative clearance prior to upcoming back surgery. No CP or SOB. No LE edema, PND or orthopnea. No dizziness or syncope. She is in a lot of pain due to her back and is very anxious to get her surgery done.    Past Medical History  Diagnosis Date  . Hypertension   .  Hyperlipidemia   . PAF (paroxysmal atrial fibrillation) (HCC)   . Obesity   . History of heartburn   . High risk medication use     Flecainide therapy  . Syncope July 2012  . Normal nuclear stress test July 2012  . LVH (left ventricular hypertrophy)     Per echo in July 2012  . Diastolic dysfunction     Grade I, per echo in 2012  . CAD (coronary artery disease)     Mild per remote cath in 2004; normal nuclear in 2012  . Complication of anesthesia     slow to wake up  . Dysrhythmia   . Pneumonia     20 yrs ago  . GERD (gastroesophageal reflux disease)     prevacid  . H/O hiatal hernia   . Headache(784.0)     sinus  . Arthritis   . Chronic kidney disease     stage3 kidney disease     Past Surgical History  Procedure Laterality Date  . Cardiac catheterization  07/28/2002    EF GREATER THAN 55%; MILD CAD  . Back surgery  1980, 2010    x2  . Tonsillectomy      age 42 yrs  . Appendectomy  1952  . Tubal ligation    . Dilation and curettage of uterus    . Colonoscopy       Current Outpatient Prescriptions  Medication Sig Dispense Refill  . aspirin EC  81 MG tablet Take 81 mg by mouth daily.    . Biotin 10 MG CAPS Take 10 mg by mouth daily.    . Cholecalciferol (VITAMIN D) 2000 UNITS tablet Take 2,000 Units by mouth daily.      . flecainide (TAMBOCOR) 50 MG tablet take 1 tablet by mouth once daily 90 tablet 3  . HYDROcodone-acetaminophen (NORCO/VICODIN) 5-325 MG tablet Take 1 tablet by mouth every 6 (six) hours as needed. For back pain  0  . loratadine (CLARITIN) 10 MG tablet Take 10 mg by mouth daily as needed for allergies.    . metoprolol succinate (TOPROL-XL) 25 MG 24 hr tablet Take 25 mg by mouth daily.  0  . ondansetron (ZOFRAN) 4 MG tablet Take 1 tablet (4 mg total) by mouth every 8 (eight) hours as needed for nausea or vomiting. 12 tablet 0  . traZODone (DESYREL) 100 MG tablet Take 100 mg by mouth at bedtime.       No current facility-administered medications for  this visit.    Allergies:   Amlodipine besylate; Codeine phosphate; Lipitor; and Sulfonamide derivatives    Social History:  The patient  reports that she has never smoked. She has never used smokeless tobacco. She reports that she does not drink alcohol or use illicit drugs.   Family History:  The patient's family history includes Lung cancer in her father.    ROS:  Please see the history of present illness.   Otherwise, review of systems are positive for NONE.   All other systems are reviewed and negative.    PHYSICAL EXAM: VS:  BP 152/96 mmHg  Pulse 56  Ht 5\' 3"  (1.6 m)  Wt 154 lb 9.6 oz (70.126 kg)  BMI 27.39 kg/m2 , BMI Body mass index is 27.39 kg/(m^2). GEN: Well nourished, well developed, in no acute distress HEENT: normal Neck: no JVD, carotid bruits, or masses Cardiac: RRR; no murmurs, rubs, or gallops,no edema  Respiratory:  clear to auscultation bilaterally, normal work of breathing GI: soft, nontender, nondistended, + BS MS: no deformity or atrophy Skin: warm and dry, no rash Neuro:  Strength and sensation are intact Psych: euthymic mood, full affect   EKG:  EKG IS ordered today. The ekg ordered today demonstrates sinus bradycardia. HR 56.    Recent Labs: 01/28/2015: ALT 16 02/05/2015: BUN 20; Creatinine, Ser 1.20*; Hemoglobin 12.1; Platelets 214; Potassium 4.3; Sodium 137    Lipid Panel    Component Value Date/Time   CHOL 235* 07/13/2010 0834   TRIG 70.0 07/13/2010 0834   HDL 68.30 07/13/2010 0834   CHOLHDL 3 07/13/2010 0834   VLDL 14.0 07/13/2010 0834   LDLDIRECT 154.7 07/13/2010 0834      Wt Readings from Last 3 Encounters:  02/11/15 154 lb 9.6 oz (70.126 kg)  02/05/15 160 lb (72.576 kg)  01/28/15 160 lb (72.576 kg)      Other studies Reviewed: Additional studies/ records that were reviewed today include: 2d ECHO. Review of the above records demonstrates:   2D ECHO: 08/30/2010 Study Conclusions - Left ventricle: The cavity size was  normal. Wall thickness was increased in a pattern of moderate LVH. Systolic function was normal. The estimated ejection fraction was in the range of 55% to 65%. Wall motion was normal; there were no regional wall motion abnormalities. Doppler parameters are consistent with abnormal left ventricular relaxation (grade 1 diastolic dysfunction). - Mitral valve: There was systolic anterior motion. Mild regurgitation. - Atrial septum: No defect or patent foramen ovale  was identified. - Pulmonary arteries: PA peak pressure: 32mm Hg (S).     ASSESSMENT AND PLAN:  Ivery QualeLina B Romanello is a 81 y.o. female with a history of HTN, probable familial HLD, PAF on Flecainide, CKD, non obst CAD (cath 2004) and GERD who presents to clinic for pre-operative clearance prior to right T12-L1 microdiskectomy.   PAF: currently maintaining NSR. Again discussed anticoagulation. She has been informed about stroke risk. CHADS-VAS -4.  Continue with flecainide for now. She does take ASA but currently holding for upcoming back surgery  HTN: BP 152/96 today. Continue Toprol XL 100 mg - she says it is a little elevated because she is in a lot of back pain   HLD: currently taking fish oil. LDL cholesterol was 211 on 09/30/12. Strongly encouraged statin therapy. Not interested currently. Understands risks, myocardial infarction, stroke. Likely familial hyperlipidemia per Dr. Anne FuSkains  CKD stage III: GFR in the 50 range. Likely secondary to hypertension. Discussed the importance of controlling blood pressure. Exercise. Avoiding NSAIDs.   Back surgery: Dr. Lovell SheehanJenkins has asked for pre-op clearance. She is not having any concerning sx and I have cleared her for surgery from a cardiac standpoint.   Current medicines are reviewed at length with the patient today.  The patient does not have concerns regarding medicines.  The following changes have been made:  no change  Labs/ tests ordered today include:  No orders of the  defined types were placed in this encounter.    Disposition:  She has been cleared for back surgery.  FU with Dr. Anne FuSkains in 6 months.   Charlestine MassedSigned, Jachin Coury R, PA-C  02/11/2015 12:14 PM    [facility] [address], [place], [place]  [address] Phone: [phone]; Fax: [phone]

## 2015-02-11 ENCOUNTER — Encounter: Payer: Self-pay | Admitting: *Deleted

## 2015-02-11 ENCOUNTER — Encounter: Payer: Self-pay | Admitting: Physician Assistant

## 2015-02-11 ENCOUNTER — Ambulatory Visit (INDEPENDENT_AMBULATORY_CARE_PROVIDER_SITE_OTHER): Payer: Medicare Other | Admitting: Physician Assistant

## 2015-02-11 VITALS — BP 152/96 | HR 56 | Ht 63.0 in | Wt 154.6 lb

## 2015-02-11 DIAGNOSIS — I480 Paroxysmal atrial fibrillation: Secondary | ICD-10-CM

## 2015-02-11 DIAGNOSIS — I10 Essential (primary) hypertension: Secondary | ICD-10-CM

## 2015-02-11 DIAGNOSIS — I2510 Atherosclerotic heart disease of native coronary artery without angina pectoris: Secondary | ICD-10-CM

## 2015-02-11 NOTE — Patient Instructions (Addendum)
Medication Instructions:  Your physician recommends that you continue on your current medications as directed. Please refer to the Current Medication list given to you today.   Labwork: None ordered  Testing/Procedures: None ordered  Follow-Up: Your physician recommends that you schedule a follow-up appointment in:     Any Other Special Instructions Will Be Listed Below (If Applicable). You have been cleared for surgery from a cardiac standpoint.  You have received a copy of the letter and one has also been sent to Dr. Lovell SheehanJenkins.   If you need a refill on your cardiac medications before your next appointment, please call your pharmacy.

## 2015-02-14 MED ORDER — CEFAZOLIN SODIUM-DEXTROSE 2-3 GM-% IV SOLR
2.0000 g | INTRAVENOUS | Status: AC
  Administered 2015-02-15: 2 g via INTRAVENOUS
  Filled 2015-02-14: qty 50

## 2015-02-15 ENCOUNTER — Ambulatory Visit (HOSPITAL_COMMUNITY)
Admission: RE | Admit: 2015-02-15 | Discharge: 2015-02-16 | Disposition: A | Discharge status: Home / Self Care | Payer: Medicare Other | Source: Ambulatory Visit | Attending: Neurosurgery | Admitting: Neurosurgery

## 2015-02-15 ENCOUNTER — Encounter (HOSPITAL_COMMUNITY): Payer: Self-pay | Admitting: Critical Care Medicine

## 2015-02-15 ENCOUNTER — Ambulatory Visit (HOSPITAL_COMMUNITY): Payer: Medicare Other | Admitting: Emergency Medicine

## 2015-02-15 ENCOUNTER — Ambulatory Visit (HOSPITAL_COMMUNITY): Payer: Medicare Other | Admitting: Critical Care Medicine

## 2015-02-15 ENCOUNTER — Ambulatory Visit (HOSPITAL_COMMUNITY): Payer: Medicare Other

## 2015-02-15 ENCOUNTER — Encounter (HOSPITAL_COMMUNITY)
Admission: RE | Disposition: A | Discharge status: Home / Self Care | Payer: Self-pay | Source: Ambulatory Visit | Attending: Neurosurgery

## 2015-02-15 DIAGNOSIS — I2510 Atherosclerotic heart disease of native coronary artery without angina pectoris: Secondary | ICD-10-CM | POA: Insufficient documentation

## 2015-02-15 DIAGNOSIS — N183 Chronic kidney disease, stage 3 (moderate): Secondary | ICD-10-CM | POA: Insufficient documentation

## 2015-02-15 DIAGNOSIS — E785 Hyperlipidemia, unspecified: Secondary | ICD-10-CM | POA: Insufficient documentation

## 2015-02-15 DIAGNOSIS — Z7982 Long term (current) use of aspirin: Secondary | ICD-10-CM | POA: Insufficient documentation

## 2015-02-15 DIAGNOSIS — M5126 Other intervertebral disc displacement, lumbar region: Secondary | ICD-10-CM | POA: Diagnosis present

## 2015-02-15 DIAGNOSIS — I129 Hypertensive chronic kidney disease with stage 1 through stage 4 chronic kidney disease, or unspecified chronic kidney disease: Secondary | ICD-10-CM | POA: Insufficient documentation

## 2015-02-15 DIAGNOSIS — Z419 Encounter for procedure for purposes other than remedying health state, unspecified: Secondary | ICD-10-CM

## 2015-02-15 DIAGNOSIS — M5114 Intervertebral disc disorders with radiculopathy, thoracic region: Secondary | ICD-10-CM | POA: Insufficient documentation

## 2015-02-15 HISTORY — PX: LUMBAR LAMINECTOMY/DECOMPRESSION MICRODISCECTOMY: SHX5026

## 2015-02-15 SURGERY — LUMBAR LAMINECTOMY/DECOMPRESSION MICRODISCECTOMY 1 LEVEL
Anesthesia: General | Site: Spine Thoracic | Laterality: Right

## 2015-02-15 MED ORDER — FLECAINIDE ACETATE 50 MG PO TABS
50.0000 mg | ORAL_TABLET | Freq: Every day | ORAL | Status: DC
  Administered 2015-02-15: 50 mg via ORAL
  Filled 2015-02-15 (×2): qty 1

## 2015-02-15 MED ORDER — BISACODYL 10 MG RE SUPP: 10.0000 mg | Freq: Every day | RECTAL | Status: DC | PRN

## 2015-02-15 MED ORDER — SODIUM CHLORIDE 0.9 % IR SOLN
Status: DC | PRN
  Administered 2015-02-15: 500 mL

## 2015-02-15 MED ORDER — GLYCOPYRROLATE 0.2 MG/ML IJ SOLN
INTRAMUSCULAR | Status: AC
  Filled 2015-02-15: qty 2

## 2015-02-15 MED ORDER — ALUM & MAG HYDROXIDE-SIMETH 200-200-20 MG/5ML PO SUSP: 30.0000 mL | Freq: Four times a day (QID) | ORAL | Status: DC | PRN

## 2015-02-15 MED ORDER — ONDANSETRON HCL 4 MG/2ML IJ SOLN: 4.0000 mg | INTRAMUSCULAR | Status: DC | PRN

## 2015-02-15 MED ORDER — DIAZEPAM 5 MG PO TABS
5.0000 mg | ORAL_TABLET | Freq: Four times a day (QID) | ORAL | Status: DC | PRN
  Administered 2015-02-15: 5 mg via ORAL
  Filled 2015-02-15: qty 1

## 2015-02-15 MED ORDER — LACTATED RINGERS IV SOLN
INTRAVENOUS | Status: DC
  Administered 2015-02-15 (×2): via INTRAVENOUS

## 2015-02-15 MED ORDER — FENTANYL CITRATE (PF) 100 MCG/2ML IJ SOLN
INTRAMUSCULAR | Status: AC
  Filled 2015-02-15: qty 2

## 2015-02-15 MED ORDER — PROPOFOL 10 MG/ML IV BOLUS
INTRAVENOUS | Status: DC | PRN
  Administered 2015-02-15: 30 mg via INTRAVENOUS
  Administered 2015-02-15: 150 mg via INTRAVENOUS

## 2015-02-15 MED ORDER — PHENOL 1.4 % MT LIQD: 1.0000 | OROMUCOSAL | Status: DC | PRN

## 2015-02-15 MED ORDER — FENTANYL CITRATE (PF) 250 MCG/5ML IJ SOLN
INTRAMUSCULAR | Status: AC
  Filled 2015-02-15: qty 10

## 2015-02-15 MED ORDER — HEMOSTATIC AGENTS (NO CHARGE) OPTIME
TOPICAL | Status: DC | PRN
  Administered 2015-02-15: 1 via TOPICAL

## 2015-02-15 MED ORDER — HYDROMORPHONE HCL 2 MG PO TABS
2.0000 mg | ORAL_TABLET | ORAL | Status: DC | PRN
  Administered 2015-02-15 (×2): 2 mg via ORAL
  Filled 2015-02-15 (×2): qty 1

## 2015-02-15 MED ORDER — ONDANSETRON HCL 4 MG/2ML IJ SOLN
INTRAMUSCULAR | Status: DC | PRN
  Administered 2015-02-15: 4 mg via INTRAVENOUS

## 2015-02-15 MED ORDER — BACITRACIN ZINC 500 UNIT/GM EX OINT
TOPICAL_OINTMENT | CUTANEOUS | Status: DC | PRN
  Administered 2015-02-15: 1 via TOPICAL

## 2015-02-15 MED ORDER — ACETAMINOPHEN 325 MG PO TABS: 650.0000 mg | ORAL_TABLET | ORAL | Status: DC | PRN

## 2015-02-15 MED ORDER — DEXAMETHASONE SODIUM PHOSPHATE 4 MG/ML IJ SOLN
INTRAMUSCULAR | Status: AC
  Filled 2015-02-15: qty 1

## 2015-02-15 MED ORDER — ROCURONIUM BROMIDE 100 MG/10ML IV SOLN
INTRAVENOUS | Status: DC | PRN
  Administered 2015-02-15: 40 mg via INTRAVENOUS

## 2015-02-15 MED ORDER — NEOSTIGMINE METHYLSULFATE 10 MG/10ML IV SOLN
INTRAVENOUS | Status: DC | PRN
  Administered 2015-02-15: 3 mg via INTRAVENOUS

## 2015-02-15 MED ORDER — MENTHOL 3 MG MT LOZG
1.0000 | LOZENGE | OROMUCOSAL | Status: DC | PRN
  Filled 2015-02-15: qty 9

## 2015-02-15 MED ORDER — PROPOFOL 10 MG/ML IV BOLUS
INTRAVENOUS | Status: AC
  Filled 2015-02-15: qty 20

## 2015-02-15 MED ORDER — THROMBIN 5000 UNITS EX SOLR
CUTANEOUS | Status: DC | PRN
  Administered 2015-02-15 (×2): 5000 [IU] via TOPICAL

## 2015-02-15 MED ORDER — GLYCOPYRROLATE 0.2 MG/ML IJ SOLN
INTRAMUSCULAR | Status: DC | PRN
  Administered 2015-02-15: 0.4 mg via INTRAVENOUS
  Administered 2015-02-15 (×2): 0.2 mg via INTRAVENOUS

## 2015-02-15 MED ORDER — DEXAMETHASONE SODIUM PHOSPHATE 4 MG/ML IJ SOLN
INTRAMUSCULAR | Status: DC | PRN
  Administered 2015-02-15: 4 mg via INTRAVENOUS

## 2015-02-15 MED ORDER — LACTATED RINGERS IV SOLN: INTRAVENOUS | Status: DC

## 2015-02-15 MED ORDER — TRAZODONE HCL 100 MG PO TABS
100.0000 mg | ORAL_TABLET | Freq: Every day | ORAL | Status: DC
  Administered 2015-02-15: 100 mg via ORAL
  Filled 2015-02-15 (×2): qty 1

## 2015-02-15 MED ORDER — DOCUSATE SODIUM 100 MG PO CAPS
100.0000 mg | ORAL_CAPSULE | Freq: Two times a day (BID) | ORAL | Status: DC
  Administered 2015-02-15: 100 mg via ORAL
  Filled 2015-02-15: qty 1

## 2015-02-15 MED ORDER — FENTANYL CITRATE (PF) 250 MCG/5ML IJ SOLN
INTRAMUSCULAR | Status: AC
  Filled 2015-02-15: qty 5

## 2015-02-15 MED ORDER — CEFAZOLIN SODIUM-DEXTROSE 2-3 GM-% IV SOLR
2.0000 g | Freq: Three times a day (TID) | INTRAVENOUS | Status: AC
  Administered 2015-02-15 – 2015-02-16 (×2): 2 g via INTRAVENOUS
  Filled 2015-02-15 (×2): qty 50

## 2015-02-15 MED ORDER — MORPHINE SULFATE (PF) 2 MG/ML IV SOLN: 1.0000 mg | INTRAVENOUS | Status: DC | PRN

## 2015-02-15 MED ORDER — LIDOCAINE HCL (CARDIAC) 20 MG/ML IV SOLN
INTRAVENOUS | Status: DC | PRN
  Administered 2015-02-15: 60 mg via INTRAVENOUS

## 2015-02-15 MED ORDER — ONDANSETRON HCL 4 MG/2ML IJ SOLN
INTRAMUSCULAR | Status: AC
  Filled 2015-02-15: qty 2

## 2015-02-15 MED ORDER — FENTANYL CITRATE (PF) 100 MCG/2ML IJ SOLN
25.0000 ug | INTRAMUSCULAR | Status: DC | PRN
  Administered 2015-02-15: 50 ug via INTRAVENOUS

## 2015-02-15 MED ORDER — LORATADINE 10 MG PO TABS: 10.0000 mg | ORAL_TABLET | Freq: Every day | ORAL | Status: DC | PRN

## 2015-02-15 MED ORDER — METOPROLOL SUCCINATE ER 25 MG PO TB24
25.0000 mg | ORAL_TABLET | Freq: Every day | ORAL | Status: DC
  Administered 2015-02-16: 25 mg via ORAL
  Filled 2015-02-15: qty 1

## 2015-02-15 MED ORDER — ACETAMINOPHEN 650 MG RE SUPP: 650.0000 mg | RECTAL | Status: DC | PRN

## 2015-02-15 MED ORDER — INFLUENZA VAC SPLIT QUAD 0.5 ML IM SUSY
0.5000 mL | PREFILLED_SYRINGE | INTRAMUSCULAR | Status: DC
  Filled 2015-02-15: qty 0.5

## 2015-02-15 MED ORDER — BUPIVACAINE-EPINEPHRINE (PF) 0.5% -1:200000 IJ SOLN
INTRAMUSCULAR | Status: DC | PRN
  Administered 2015-02-15: 20 mL

## 2015-02-15 MED ORDER — ONDANSETRON HCL 4 MG PO TABS: 4.0000 mg | ORAL_TABLET | Freq: Three times a day (TID) | ORAL | Status: DC | PRN

## 2015-02-15 MED ORDER — FENTANYL CITRATE (PF) 100 MCG/2ML IJ SOLN
INTRAMUSCULAR | Status: DC | PRN
  Administered 2015-02-15 (×6): 50 ug via INTRAVENOUS

## 2015-02-15 SURGICAL SUPPLY — 54 items
APL SKNCLS STERI-STRIP NONHPOA (GAUZE/BANDAGES/DRESSINGS) ×1
BAG DECANTER FOR FLEXI CONT (MISCELLANEOUS) ×2 IMPLANT
BENZOIN TINCTURE PRP APPL 2/3 (GAUZE/BANDAGES/DRESSINGS) ×2 IMPLANT
BLADE CLIPPER SURG (BLADE) IMPLANT
BRUSH SCRUB EZ PLAIN DRY (MISCELLANEOUS) ×2 IMPLANT
BUR MATCHSTICK NEURO 3.0 LAGG (BURR) ×2 IMPLANT
BUR PRECISION FLUTE 6.0 (BURR) ×2 IMPLANT
CANISTER SUCT 3000ML PPV (MISCELLANEOUS) ×2 IMPLANT
DRAPE LAPAROTOMY 100X72X124 (DRAPES) ×2 IMPLANT
DRAPE MICROSCOPE LEICA (MISCELLANEOUS) ×2 IMPLANT
DRAPE POUCH INSTRU U-SHP 10X18 (DRAPES) ×2 IMPLANT
DRAPE SURG 17X23 STRL (DRAPES) ×8 IMPLANT
ELECT BLADE 4.0 EZ CLEAN MEGAD (MISCELLANEOUS) ×2
ELECT REM PT RETURN 9FT ADLT (ELECTROSURGICAL) ×2
ELECTRODE BLDE 4.0 EZ CLN MEGD (MISCELLANEOUS) ×1 IMPLANT
ELECTRODE REM PT RTRN 9FT ADLT (ELECTROSURGICAL) ×1 IMPLANT
GAUZE SPONGE 4X4 12PLY STRL (GAUZE/BANDAGES/DRESSINGS) ×2 IMPLANT
GAUZE SPONGE 4X4 16PLY XRAY LF (GAUZE/BANDAGES/DRESSINGS) IMPLANT
GLOVE BIO SURGEON STRL SZ 6.5 (GLOVE) ×2 IMPLANT
GLOVE BIO SURGEON STRL SZ8 (GLOVE) ×2 IMPLANT
GLOVE BIO SURGEON STRL SZ8.5 (GLOVE) ×2 IMPLANT
GLOVE BIOGEL PI IND STRL 6.5 (GLOVE) IMPLANT
GLOVE BIOGEL PI IND STRL 7.0 (GLOVE) IMPLANT
GLOVE BIOGEL PI INDICATOR 6.5 (GLOVE) ×1
GLOVE BIOGEL PI INDICATOR 7.0 (GLOVE) ×1
GLOVE ECLIPSE 9.0 STRL (GLOVE) ×1 IMPLANT
GLOVE EXAM NITRILE LRG STRL (GLOVE) IMPLANT
GLOVE EXAM NITRILE MD LF STRL (GLOVE) IMPLANT
GLOVE EXAM NITRILE XL STR (GLOVE) IMPLANT
GLOVE EXAM NITRILE XS STR PU (GLOVE) IMPLANT
GOWN STRL REUS W/ TWL LRG LVL3 (GOWN DISPOSABLE) IMPLANT
GOWN STRL REUS W/ TWL XL LVL3 (GOWN DISPOSABLE) ×1 IMPLANT
GOWN STRL REUS W/TWL 2XL LVL3 (GOWN DISPOSABLE) IMPLANT
GOWN STRL REUS W/TWL LRG LVL3 (GOWN DISPOSABLE) ×2
GOWN STRL REUS W/TWL XL LVL3 (GOWN DISPOSABLE) ×4
KIT BASIN OR (CUSTOM PROCEDURE TRAY) ×2 IMPLANT
KIT ROOM TURNOVER OR (KITS) ×2 IMPLANT
NDL HYPO 21X1.5 SAFETY (NEEDLE) IMPLANT
NEEDLE HYPO 21X1.5 SAFETY (NEEDLE) IMPLANT
NEEDLE HYPO 22GX1.5 SAFETY (NEEDLE) ×3 IMPLANT
NS IRRIG 1000ML POUR BTL (IV SOLUTION) ×2 IMPLANT
PACK LAMINECTOMY NEURO (CUSTOM PROCEDURE TRAY) ×2 IMPLANT
PAD ARMBOARD 7.5X6 YLW CONV (MISCELLANEOUS) ×8 IMPLANT
PATTIES SURGICAL .5 X1 (DISPOSABLE) IMPLANT
RUBBERBAND STERILE (MISCELLANEOUS) ×4 IMPLANT
SPONGE SURGIFOAM ABS GEL SZ50 (HEMOSTASIS) ×2 IMPLANT
STRIP CLOSURE SKIN 1/2X4 (GAUZE/BANDAGES/DRESSINGS) ×2 IMPLANT
SUT VIC AB 1 CT1 18XBRD ANBCTR (SUTURE) ×1 IMPLANT
SUT VIC AB 1 CT1 8-18 (SUTURE) ×2
SUT VIC AB 2-0 CP2 18 (SUTURE) ×2 IMPLANT
TAPE CLOTH SURG 4X10 WHT LF (GAUZE/BANDAGES/DRESSINGS) ×1 IMPLANT
TOWEL OR 17X24 6PK STRL BLUE (TOWEL DISPOSABLE) ×2 IMPLANT
TOWEL OR 17X26 10 PK STRL BLUE (TOWEL DISPOSABLE) ×2 IMPLANT
WATER STERILE IRR 1000ML POUR (IV SOLUTION) ×2 IMPLANT

## 2015-02-15 NOTE — Anesthesia Postprocedure Evaluation (Signed)
Anesthesia Post Note  Patient: Cheryl [name]  Procedure(s) Performed: Procedure(s) (LRB): Right Thoracic twelve-Lumbar one  Microdiskectomy (Right)  Patient location during evaluation: PACU Anesthesia Type: General Level of consciousness: awake Pain management: pain level controlled Vital Signs Assessment: post-procedure vital signs reviewed and stable Respiratory status: spontaneous breathing Cardiovascular status: stable Anesthetic complications: no    Last Vitals:  Filed Vitals:   02/15/15 1454 02/15/15 1457  BP:    Pulse: 55 57  Temp:    Resp: 26 19    Last Pain:  Filed Vitals:   02/15/15 1459  PainSc: 5     LLE Motor Response: Purposeful movement;Responds to commands (02/15/15 1457) LLE Sensation: No numbness (02/15/15 1457) RLE Motor Response: Purposeful movement;Responds to commands (02/15/15 1457) RLE Sensation: No numbness (02/15/15 1457)      EDWARDS,Hermina Barnard

## 2015-02-15 NOTE — Op Note (Signed)
Brief history: The patient is a 81 year old white female who has complained of back and right leg pain consistent with a lumbar radiculopathy. She has failed medical management and was worked up with a lumbar MRI. This demonstrated the patient had a herniated disc at T12-L1 on the right. I discussed the various treatment options with patient including surgery. She has weighed the risks, benefits, and alternative surgery and decided proceed with a right T12-L1 discectomy.  Preoperative diagnosis: Right T12-L1 herniated disc, lumbago, lumbar radiculopathy  Postoperative diagnosis: The same  Procedure: Right T12-L1 far lateral Intervertebral discectomy using micro-dissection  Surgeon: Dr. Delma OfficerJeff Ramondo Dietze  Asst.: Dr. Altamease OilerAndy Pool  Anesthesia: Gen. endotracheal  Estimated blood loss: Minimal  Drains: None  Complications: None  Description of procedure: The patient was brought to the operating room by the anesthesia team. General endotracheal anesthesia was induced. The patient was turned to the prone position on the Wilson frame. The patient's lumbosacral region was then prepared with Betadine scrub and Betadine solution. Sterile drapes were applied.  I then injected the area to be incised with Marcaine with epinephrine solution. I then used a scalpel to make a linear midline incision over the T12-L1 intervertebral disc space. I then used electrocautery to perform a right sided subperiosteal dissection exposing the spinous process and lamina of T12 and L1. We obtained intraoperative radiograph to confirm our location. I then inserted the [facility] retractor for exposure.  We then brought the operative microscope into the field. Under its magnification and illumination we completed the microdissection. I used a high-speed drill to drill off the lateral aspect of the right T12 pars. I then used a Kerrison punches to remove the inner transverse ligament at T12-L1 on the right. We then used microdissection  to free up the exiting T12 nerve root from the epidural tissue. I then used a Kerrison punch to perform a foraminotomy at about the right T12 nerve root. We used micro-section of free up the T12 nerve root and epidural tissue. We dissected out laterally and caudally to the nerve root and noted a large free fragment far lateral herniated disc. I removed multiple fragments using a micro-pituitary forceps. I inspected the intervertebral disc. I do not see any impending herniations. We did not perform an intervertebral discectomy.  I then palpated along the ventral surface of the thecal sac and along exit route of the right T12 nerve root and noted that the neural structures were well decompressed. This completed the decompression.  We then obtained hemostasis using bipolar electrocautery. We irrigated the wound out with bacitracin solution. We then removed the retractor. We then reapproximated the patient's thoracolumbar fascia with interrupted #1 Vicryl suture. We then reapproximated the patient's subcutaneous tissue with interrupted 2-0 Vicryl suture. We then reapproximated patient's skin with Steri-Strips and benzoin. The was then coated with bacitracin ointment. The drapes were removed. The patient was subsequently returned to the supine position where they were extubated by the anesthesia team. The patient was then transported to the postanesthesia care unit in stable condition. All sponge instrument and needle counts were reportedly correct at the end of this case.

## 2015-02-15 NOTE — H&P (Signed)
Subjective: The patient is a 81 year old white female who has complained of back and right leg pain consistent with a lumbar radiculopathy. She has failed medical management and was worked up with a lumbar MRI which demonstrated a herniated disc at T12-L1 on the right. I discussed situation with patient. We discussed the various treatment options including surgery. She has decided to proceed with a right T12-L1 discectomy.   Past Medical History  Diagnosis Date  . Hypertension   . Hyperlipidemia   . PAF (paroxysmal atrial fibrillation) (HCC)   . Obesity   . History of heartburn   . High risk medication use     Flecainide therapy  . Syncope July 2012  . Normal nuclear stress test July 2012  . LVH (left ventricular hypertrophy)     Per echo in July 2012  . Diastolic dysfunction     Grade I, per echo in 2012  . CAD (coronary artery disease)     Mild per remote cath in 2004; normal nuclear in 2012  . Complication of anesthesia     slow to wake up  . Dysrhythmia   . Pneumonia     20 yrs ago  . GERD (gastroesophageal reflux disease)     prevacid  . H/O hiatal hernia   . Headache(784.0)     sinus  . Arthritis   . Chronic kidney disease     stage3 kidney disease     Past Surgical History  Procedure Laterality Date  . Cardiac catheterization  07/28/2002    EF GREATER THAN 55%; MILD CAD  . Back surgery  1980, 2010    x2  . Tonsillectomy      age 29 yrs  . Appendectomy  1952  . Tubal ligation    . Dilation and curettage of uterus    . Colonoscopy      Allergies  Allergen Reactions  . Amlodipine Besylate     REACTION: dizzy  . Codeine Phosphate     headache  . Lipitor [Atorvastatin Calcium]     Muscle pain and tiredness  . Sulfonamide Derivatives     REACTION: unspecified    Social History  Substance Use Topics  . Smoking status: Never Smoker   . Smokeless tobacco: Never Used  . Alcohol Use: No    Family History  Problem Relation Age of Onset  . Lung cancer  Father    Prior to Admission medications   Medication Sig Start Date End Date Taking? Authorizing Provider  Biotin 10 MG CAPS Take 10 mg by mouth daily.   Yes Historical Provider, MD  Cholecalciferol (VITAMIN D) 2000 UNITS tablet Take 2,000 Units by mouth daily.     Yes Historical Provider, MD  flecainide (TAMBOCOR) 50 MG tablet take 1 tablet by mouth once daily 11/20/14  Yes Jake BatheMark C Skains, MD  HYDROcodone-acetaminophen (NORCO/VICODIN) 5-325 MG tablet Take 1 tablet by mouth every 6 (six) hours as needed. For back pain 01/30/15  Yes Historical Provider, MD  loratadine (CLARITIN) 10 MG tablet Take 10 mg by mouth daily as needed for allergies.   Yes Historical Provider, MD  metoprolol succinate (TOPROL-XL) 25 MG 24 hr tablet Take 25 mg by mouth daily. 05/28/14  Yes Historical Provider, MD  ondansetron (ZOFRAN) 4 MG tablet Take 1 tablet (4 mg total) by mouth every 8 (eight) hours as needed for nausea or vomiting. 01/28/15  Yes Lavera Guiseana Duo Liu, MD  traZODone (DESYREL) 100 MG tablet Take 100 mg by mouth at bedtime.  Yes Historical Provider, MD  aspirin EC 81 MG tablet Take 81 mg by mouth daily.    Historical Provider, MD     Review of Systems  Positive ROS: As above  All other systems have been reviewed and were otherwise negative with the exception of those mentioned in the HPI and as above.  Objective: Vital signs in last 24 hours: Temp:  [98 F (36.7 C)] 98 F (36.7 C) (01/09 0916) Pulse Rate:  [55] 55 (01/09 0916) Resp:  [18] 18 (01/09 0916) BP: (186-214)/(79-84) 214/84 mmHg (01/09 1011) SpO2:  [95 %] 95 % (01/09 0916) Weight:  [69.854 kg (154 lb)] 69.854 kg (154 lb) (01/09 0916)  General Appearance: Alert, cooperative, no distress, Head: Normocephalic, without obvious abnormality, atraumatic Eyes: PERRL, conjunctiva/corneas clear, EOM's intact,    Ears: Normal  Throat: Normal  Neck: Supple, symmetrical, trachea midline, no adenopathy; thyroid: No enlargement/tenderness/nodules; no  carotid bruit or JVD Back: Symmetric, no curvature, ROM normal, no CVA tenderness Lungs: Clear to auscultation bilaterally, respirations unlabored Heart: Regular rate and rhythm, no murmur, rub or gallop Abdomen: Soft, non-tender,, no masses, no organomegaly Extremities: Extremities normal, atraumatic, no cyanosis or edema Pulses: 2+ and symmetric all extremities Skin: Skin color, texture, turgor normal, no rashes or lesions  NEUROLOGIC:   Mental status: alert and oriented, no aphasia, good attention span, Fund of knowledge/ memory ok Motor Exam - grossly normal Sensory Exam - grossly normal Reflexes:  Coordination - grossly normal Gait - grossly normal Balance - grossly normal Cranial Nerves: I: smell Not tested  II: visual acuity  OS: Normal  OD: Normal   II: visual fields Full to confrontation  II: pupils Equal, round, reactive to light  III,VII: ptosis None  III,IV,VI: extraocular muscles  Full ROM  V: mastication Normal  V: facial light touch sensation  Normal  V,VII: corneal reflex  Present  VII: facial muscle function - upper  Normal  VII: facial muscle function - lower Normal  VIII: hearing Not tested  IX: soft palate elevation  Normal  IX,X: gag reflex Present  XI: trapezius strength  5/5  XI: sternocleidomastoid strength 5/5  XI: neck flexion strength  5/5  XII: tongue strength  Normal    Data Review Lab Results  Component Value Date   WBC 5.4 02/05/2015   HGB 12.1 02/05/2015   HCT 36.3 02/05/2015   MCV 93.3 02/05/2015   PLT 214 02/05/2015   Lab Results  Component Value Date   NA 137 02/05/2015   K 4.3 02/05/2015   CL 101 02/05/2015   CO2 27 02/05/2015   BUN 20 02/05/2015   CREATININE 1.20* 02/05/2015   GLUCOSE 105* 02/05/2015   Lab Results  Component Value Date   INR 1.0 08/11/2008    Assessment/Plan: Right T12-L1 herniated disc, lumbago, lumbar radiculopathy: I discussed the situation with the patient. I have reviewed her MRI scan with her  and pointed out the abnormalities. We have discussed the various treatment options including surgery. I have described a right T12-L1 discectomy. I have shown her surgical models. We have discussed the risks, benefits, alternatives, and likelihood of achieving goals with surgery. I have answered all the patient's questions. She has decided to proceed with surgery.   Arantza Darrington D 02/15/2015 11:32 AM

## 2015-02-15 NOTE — Progress Notes (Signed)
Call to Dr. Randa EvensEdwards, reported pt. BP's

## 2015-02-15 NOTE — Anesthesia Procedure Notes (Signed)
Procedure Name: Intubation Date/Time: 02/15/2015 12:11 PM Performed by: Glo HerringLEE, Hamsa Laurich B Pre-anesthesia Checklist: Patient identified, Emergency Drugs available, Suction available, Timeout performed and Patient being monitored Patient Re-evaluated:Patient Re-evaluated prior to inductionOxygen Delivery Method: Circle system utilized Preoxygenation: Pre-oxygenation with 100% oxygen Intubation Type: IV induction Ventilation: Mask ventilation without difficulty Laryngoscope Size: Mac and 3 Grade View: Grade II Tube type: Oral Tube size: 7.0 mm Number of attempts: 1 Airway Equipment and Method: Stylet Placement Confirmation: CO2 detector,  positive ETCO2,  ETT inserted through vocal cords under direct vision and breath sounds checked- equal and bilateral Secured at: 21 cm Tube secured with: Tape Dental Injury: Teeth and Oropharynx as per pre-operative assessment

## 2015-02-15 NOTE — Transfer of Care (Signed)
Immediate Anesthesia Transfer of Care Note  Patient: Cheryl [name]  Procedure(s) Performed: Procedure(s) with comments: Right Thoracic twelve-Lumbar one  Microdiskectomy (Right) - Thoracic/Lumbar  Patient Location: PACU  Anesthesia Type:General  Level of Consciousness: awake, alert  and oriented  Airway & Oxygen Therapy: Patient Spontanous Breathing and Patient connected to nasal cannula oxygen  Post-op Assessment: Report given to RN, Post -op Vital signs reviewed and stable and Patient moving all extremities X 4  Post vital signs: Reviewed and stable  Last Vitals:  Filed Vitals:   02/15/15 0918 02/15/15 1011  BP: 186/79 214/84  Pulse:    Temp:    Resp:      Complications: No apparent anesthesia complications

## 2015-02-15 NOTE — Anesthesia Preprocedure Evaluation (Signed)
Anesthesia Evaluation  Patient identified by MRN, date of birth, ID band Patient awake    Reviewed: Allergy & Precautions, NPO status , Patient's Chart, lab work & pertinent test results  Airway Mallampati: II  TM Distance: >3 FB Neck ROM: Full    Dental   Pulmonary pneumonia,    breath sounds clear to auscultation       Cardiovascular hypertension, + CAD  + dysrhythmias  Rhythm:Regular Rate:Normal     Neuro/Psych    GI/Hepatic Neg liver ROS, hiatal hernia, GERD  ,  Endo/Other  negative endocrine ROS  Renal/GU Renal disease     Musculoskeletal   Abdominal   Peds  Hematology   Anesthesia Other Findings   Reproductive/Obstetrics                             Anesthesia Physical Anesthesia Plan  ASA: III  Anesthesia Plan: General   Post-op Pain Management:    Induction: Intravenous  Airway Management Planned: Oral ETT  Additional Equipment:   Intra-op Plan:   Post-operative Plan: Possible Post-op intubation/ventilation  Informed Consent: I have reviewed the patients History and Physical, chart, labs and discussed the procedure including the risks, benefits and alternatives for the proposed anesthesia with the patient or authorized representative who has indicated his/her understanding and acceptance.   Dental advisory given  Plan Discussed with: CRNA, Anesthesiologist and Surgeon  Anesthesia Plan Comments:         Anesthesia Quick Evaluation

## 2015-02-16 ENCOUNTER — Encounter (HOSPITAL_COMMUNITY): Payer: Self-pay | Admitting: Neurosurgery

## 2015-02-16 DIAGNOSIS — M5114 Intervertebral disc disorders with radiculopathy, thoracic region: Secondary | ICD-10-CM | POA: Diagnosis not present

## 2015-02-16 MED ORDER — CYCLOBENZAPRINE HCL 5 MG PO TABS: 5.0000 mg | ORAL_TABLET | Freq: Three times a day (TID) | ORAL | Status: DC | PRN

## 2015-02-16 MED ORDER — HYDROMORPHONE HCL 2 MG PO TABS: 2.0000 mg | ORAL_TABLET | ORAL | Status: DC | PRN

## 2015-02-16 MED ORDER — DOCUSATE SODIUM 100 MG PO CAPS: 100.0000 mg | ORAL_CAPSULE | Freq: Two times a day (BID) | ORAL | Status: DC

## 2015-02-16 NOTE — Progress Notes (Signed)
Pt doing well. Pt and husband given D/C instructions with Rx's, verbal understanding was provided. Pt's IV was removed prior to D/C. Pt's incision is clean and dry with no sign of infection. Pt D/C'd home via wheelchair @ 1005 per MD order. Pt is stable @ D/C and has no other needs at this time. Rema FendtAshley Jariah Jarmon, RN

## 2015-02-16 NOTE — Discharge Instructions (Signed)
Call MD for: Difficulty breathing, headache or visual disturbances, extreme fatigue  Call MD for: hives  Call MD for: persistant dizziness or light-headedness  Call MD for: persistant nausea and vomiting  Call MD for: redness, tenderness, or signs of infection (pain, swelling, redness, odor or green/yellow discharge around incision site)  Call MD for: severe uncontrolled pain  Call MD for: temperature >100.4 Diet - low sodium heart healthy Discharge instructions   Call [phone] for a followup appointment.   Increase activity slowly Remove dressing in 48 hours   Microdiskectomy  Your spine is made up of bones called vertebrae. Oval-shaped disks filled with a thick liquid sit between the vertebrae. The disks function like cushions and keep the bones from rubbing together. However, an injury or normal aging can cause a disk to bulge out (herniated disk). The herniated disk can press on a nerve root, which is painful. The pain may be in the lower back, or it might shoot down a leg. There also may be tingling or numbness. One way to stop the pain is with a minimally invasive surgery called microdiskectomy. The part of the disk that sticks out from the spine is removed. Only a small surgical cut (incision) is made in the back. Microdiskectomy is easier on the muscles and other tissues than traditional surgery.  LET YOUR CAREGIVER KNOW ABOUT:  Any allergies.  The last time you had anything to eat or drink. This includes water, gum, and candy.  All medicines you are taking, including herbs, eyedrops, over-the-counter medicines and creams.  Blood thinners (anticoagulants), aspirin, or other drugs that could affect blood clotting.  Any history of blood clots.  Any history of bleeding or other blood problems.  Use of steroids (by mouth or as creams).  Previous problems with anesthesia.  Family history of anesthetic complications.  Possibility of pregnancy, if this applies.  Previous  surgery.  Smoking history.  Any recent symptoms of colds or infections.  Other health problems. RISKS AND COMPLICATIONS   Leaking of spinal fluid. If this happens, you will need to stay in bed for a few days.  Bleeding.  Pain.  Infection near the incision.  Nerve damage.  Blood clot in a leg. The clot can move to the lungs. This can be very serious.  A herniated disk might come back (recur) and require additional surgery. BEFORE THE PROCEDURE   A medical evaluation will be done. This may include:  A physical exam.  Blood tests.  A test that checks heart rhythm (electrocardiography).  Imaging tests such as a myelogram, chest X-ray, or MRI.  Ask your caregiver about changing or stopping your regular medicines.  You may need to stop using aspirin and nonsteroidal anti-inflammatory drugs (NSAIDs). This includes prescription drugs and over-the-counter drugs. If possible, do this 10 to 14 days before the procedure or as directed. You may also need to stop taking vitamin E.  If you take blood thinners, ask your caregiver when you should stop taking them.  You may need a stool-softening medicine a few days before the procedure.  Stop smoking 2 weeks before the procedure if you smoke. Smoking can slow down the healing process.  The day before the procedure, eat only a light dinner. Do not eat or drink anything for at least 8 hours before the procedure. Ask if it is okay to take any needed medicines with a sip of water.  You may talk with the person who will be in charge of the anesthetic medicine  during the procedure.  Arrive at least 1 to 2 hours before the procedure or as directed. PROCEDURE  Small monitors will be put on your body. They are used to check your heart, blood pressure, and oxygen level.  An intravenous (IV) access tube will be inserted in your vein. Medicine will be able to flow directly into your body through the IV.  You might be given a medicine to  help you relax (sedative).  You will be given a medicine that makes you sleep (general anesthetic).  Your back will be cleaned with a special solution to kill germs on the skin.  Once you are asleep, the surgeon will make a small incision in the middle of your lower back. It is often just 1 to 2 inches long.  Muscles in the back are not cut. They are moved to the side.  Often, a small piece of bone needs to be removed. This creates a better "window" so the surgeon can see the herniated disk.  The surgeon uses a microscope to check the disk and nerves near it.  The part of the disk that is causing problems is removed.  The area under the skin is closed with stitches. In time, these will go away on their own.  The skin is closed with small stitches (sutures) or staples.  A small bandage (dressing) is put over the incision.  The procedure usually takes about 1 hour. AFTER THE PROCEDURE   You will stay in a recovery area until the anesthesia has worn off. Your blood pressure and pulse will be checked every so often.  Some pain is normal after this surgery. You will probably be given pain medicine while in the recovery area.  You may be able to go home the same day as the surgery. Once you get up and start walking, you will be able to go home. Sometimes, an overnight stay is needed.   This information is not intended to replace advice given to you by your health care provider. Make sure you discuss any questions you have with your health care provider.   Document Released: 01/11/2009 Document Revised: 04/17/2011 Document Reviewed: 07/08/2014 [employer] Interactive Patient Education [employer]2016 [employer]

## 2015-02-16 NOTE — Evaluation (Signed)
Physical Therapy Evaluation and Discharge Patient Details Name: Cheryl [name] MRN: [id number] DOB: [birth year] Today's Date: 02/16/2015   History of Present Illness  Pt is a 81 y/o female who presents sp T12-L1 laminectomy/decompression on 02/15/2015.  Clinical Impression  Patient evaluated by Physical Therapy with no further acute PT needs identified. All education has been completed and the patient has no further questions. At the time of PT eval pt was able to perform transfers and ambulation at a gross supervision level. Pt has a single point cane that was present during session. Gilmer Mor was adjusted to appropriate height. Pt will have husband available at home for assistance when needed. See below for any follow-up Physical Therapy or equipment needs. PT is signing off. Thank you for this referral.    Follow Up Recommendations Outpatient PT    Equipment Recommendations  None recommended by PT    Recommendations for Other Services       Precautions / Restrictions Precautions Precautions: Fall;Back Precaution Booklet Issued: Yes (comment) Precaution Comments: Reviewed precautions from handout and during functional mobility.  Restrictions Weight Bearing Restrictions: No      Mobility  Bed Mobility Overal bed mobility: Modified Independent Bed Mobility: Rolling;Sidelying to Sit           General bed mobility comments: HOB flat, rails down to simulate home environment. No assist required. Pt had good log roll technique.   Transfers Overall transfer level: Needs assistance Equipment used: Straight cane Transfers: Sit to/from Stand Sit to Stand: Supervision         General transfer comment: Supervision for safety. No assist required with SPC in hand.   Ambulation/Gait Ambulation/Gait assistance: Supervision Ambulation Distance (Feet): 200 Feet Assistive device: Straight cane Gait Pattern/deviations: Step-through pattern;Decreased stride length Gait velocity:  Decreased Gait velocity interpretation: Below normal speed for age/gender General Gait Details: Pt appears slightly unsteady at first, and min guard was provided for safety. As pt progressed with gait training, was at a supervision level with good use of cane by end of gait training.   Stairs Stairs: Yes Stairs assistance: Min guard Stair Management: One rail Right;With cane;Step to pattern Number of Stairs: 1 General stair comments: Pt inisisted on holding the rail even though home environment does not have a railing. Pt did well and feel she will be able to safely enter home with husband assisting her.   Wheelchair Mobility    Modified Rankin (Stroke Patients Only)       Balance Overall balance assessment: Needs assistance Sitting-balance support: Feet supported;No upper extremity supported Sitting balance-Leahy Scale: Good     Standing balance support: During functional activity;Single extremity supported Standing balance-Leahy Scale: Fair                               Pertinent Vitals/Pain Pain Assessment: Faces Faces Pain Scale: Hurts little more Pain Location: Incision Pain Descriptors / Indicators: Operative site guarding;Discomfort Pain Intervention(s): Limited activity within patient's tolerance;Monitored during session;Repositioned    Home Living Family/patient expects to be discharged to:: Private residence Living Arrangements: Spouse/significant other Available Help at Discharge: Family;Available 24 hours/day Type of Home: House Home Access: Stairs to enter Entrance Stairs-Rails: None Entrance Stairs-Number of Steps: 1 Home Layout: One level Home Equipment: Cane - single point;Shower seat      Prior Function Level of Independence: Independent               Hand Dominance   Dominant  Hand: Right    Extremity/Trunk Assessment   Upper Extremity Assessment: Overall WFL for tasks assessed           Lower Extremity Assessment:  Generalized weakness      Cervical / Trunk Assessment: Kyphotic  Communication   Communication: No difficulties  Cognition Arousal/Alertness: Awake/alert Behavior During Therapy: WFL for tasks assessed/performed Overall Cognitive Status: Within Functional Limits for tasks assessed       Memory: Decreased recall of precautions              General Comments      Exercises        Assessment/Plan    PT Assessment Patent does not need any further PT services  PT Diagnosis Difficulty walking;Acute pain   PT Problem List    PT Treatment Interventions     PT Goals (Current goals can be found in the Care Plan section) Acute Rehab PT Goals PT Goal Formulation: All assessment and education complete, DC therapy    Frequency     Barriers to discharge        Co-evaluation               End of Session Equipment Utilized During Treatment: Gait belt Activity Tolerance: Patient tolerated treatment well Patient left: in chair;with call bell/phone within reach;with family/visitor present Nurse Communication: Mobility status    Functional Assessment Tool Used: Clinical judgement Functional Limitation: Mobility: Walking and moving around Mobility: Walking and Moving Around Current Status ([id number]): At least 1 percent but less than 20 percent impaired, limited or restricted Mobility: Walking and Moving Around Goal Status [phone]): At least 1 percent but less than 20 percent impaired, limited or restricted Mobility: Walking and Moving Around Discharge Status [phone](G8980): At least 1 percent but less than 20 percent impaired, limited or restricted    Time: 0801-0824 PT Time Calculation (min) (ACUTE ONLY): 23 min   Charges:   PT Evaluation $PT Eval Moderate Complexity: 1 Procedure PT Treatments $Gait Training: 8-22 mins   PT G Codes:   PT G-Codes **NOT FOR INPATIENT CLASS** Functional Assessment Tool Used: Clinical judgement Functional Limitation: Mobility: Walking and  moving around Mobility: Walking and Moving Around Current Status ([id number]([id number]): At least 1 percent but less than 20 percent impaired, limited or restricted Mobility: Walking and Moving Around Goal Status [phone]([phone]): At least 1 percent but less than 20 percent impaired, limited or restricted Mobility: Walking and Moving Around Discharge Status [phone]([phone]): At least 1 percent but less than 20 percent impaired, limited or restricted    Conni SlipperKirkman, Mayling Aber 02/16/2015, 9:10 AM  Conni SlipperLaura Graycie Halley, PT, DPT Acute Rehabilitation Services Pager: [phone]

## 2015-02-16 NOTE — Discharge Summary (Signed)
Physician Discharge Summary  Patient ID: Cheryl [name] MRN: [id number] DOB/AGE: Jun 23, 1934 81 y.o.  Admit date: 02/15/2015 Discharge date: 02/16/2015  Admission Diagnoses: Right T12-L1 far lateral herniated disc, radiculopathy  Discharge Diagnoses: The same Active Problems:   Lumbar herniated disc   Discharged Condition: good  Hospital Course: I performed a right T12-L1 far lateral discectomy on the patient on 02/15/2015. The surgery went well.  The patient's postoperative course was unremarkable. On postoperative day #1 the patient's radicular pain was gone. She requested discharge home. She was given written and oral discharge instructions. All questions were answered.  Consults: None Significant Diagnostic Studies: None Treatments: Right T12-L1 far lateral discectomy using microdissection Discharge Exam: Blood pressure 181/71, pulse 57, temperature 98.6 F (37 C), temperature source Oral, resp. rate 16, weight 69.854 kg (154 lb), SpO2 96 %. The patient is alert and pleasant. Her strength is normal. She looks well. Her dressing is clean and dry.  Disposition: Home  Discharge Instructions    Call MD for:  difficulty breathing, headache or visual disturbances    Complete by:  As directed      Call MD for:  extreme fatigue    Complete by:  As directed      Call MD for:  hives    Complete by:  As directed      Call MD for:  persistant dizziness or light-headedness    Complete by:  As directed      Call MD for:  persistant nausea and vomiting    Complete by:  As directed      Call MD for:  redness, tenderness, or signs of infection (pain, swelling, redness, odor or green/yellow discharge around incision site)    Complete by:  As directed      Call MD for:  severe uncontrolled pain    Complete by:  As directed      Call MD for:  temperature >100.4    Complete by:  As directed      Diet - low sodium heart healthy    Complete by:  As directed      Discharge instructions     Complete by:  As directed   Call [phone] for a followup appointment. Take a stool softener while you are using pain medications.     Driving Restrictions    Complete by:  As directed   Do not drive for 2 weeks.     Increase activity slowly    Complete by:  As directed      Lifting restrictions    Complete by:  As directed   Do not lift more than 5 pounds. No excessive bending or twisting.     May shower / Bathe    Complete by:  As directed   He may shower after the pain she is removed 3 days after surgery. Leave the incision alone.     Remove dressing in 48 hours    Complete by:  As directed   Your stitches are under the scan and will dissolve by themselves. The Steri-Strips will fall off after you take a few showers. Do not rub back or pick at the wound, Leave the wound alone.            Medication List    STOP taking these medications        HYDROcodone-acetaminophen 5-325 MG tablet  Commonly known as:  NORCO/VICODIN      TAKE these medications  aspirin EC 81 MG tablet  Take 81 mg by mouth daily.     Biotin 10 MG Caps  Take 10 mg by mouth daily.     cyclobenzaprine 5 MG tablet  Commonly known as:  FLEXERIL  Take 1 tablet (5 mg total) by mouth 3 (three) times daily as needed for muscle spasms.     docusate sodium 100 MG capsule  Commonly known as:  COLACE  Take 1 capsule (100 mg total) by mouth 2 (two) times daily.     flecainide 50 MG tablet  Commonly known as:  TAMBOCOR  take 1 tablet by mouth once daily     HYDROmorphone 2 MG tablet  Commonly known as:  DILAUDID  Take 1 tablet (2 mg total) by mouth every 4 (four) hours as needed for severe pain.     loratadine 10 MG tablet  Commonly known as:  CLARITIN  Take 10 mg by mouth daily as needed for allergies.     metoprolol succinate 25 MG 24 hr tablet  Commonly known as:  TOPROL-XL  Take 25 mg by mouth daily.     ondansetron 4 MG tablet  Commonly known as:  ZOFRAN  Take 1 tablet (4 mg total) by  mouth every 8 (eight) hours as needed for nausea or vomiting.     traZODone 100 MG tablet  Commonly known as:  DESYREL  Take 100 mg by mouth at bedtime.     Vitamin D 2000 units tablet  Take 2,000 Units by mouth daily.         SignedCristi Loron: Vishruth Seoane D 02/16/2015, 7:53 AM

## 2015-03-04 ENCOUNTER — Emergency Department (HOSPITAL_COMMUNITY)
Admission: EM | Admit: 2015-03-04 | Discharge: 2015-03-04 | Disposition: A | Discharge status: Home / Self Care | Payer: Medicare Other | Attending: Emergency Medicine | Admitting: Emergency Medicine

## 2015-03-04 ENCOUNTER — Emergency Department (HOSPITAL_COMMUNITY): Payer: Medicare Other

## 2015-03-04 ENCOUNTER — Encounter (HOSPITAL_COMMUNITY): Payer: Self-pay | Admitting: Emergency Medicine

## 2015-03-04 DIAGNOSIS — I2510 Atherosclerotic heart disease of native coronary artery without angina pectoris: Secondary | ICD-10-CM | POA: Diagnosis not present

## 2015-03-04 DIAGNOSIS — T45525A Adverse effect of antithrombotic drugs, initial encounter: Secondary | ICD-10-CM | POA: Diagnosis not present

## 2015-03-04 DIAGNOSIS — R279 Unspecified lack of coordination: Secondary | ICD-10-CM

## 2015-03-04 DIAGNOSIS — R112 Nausea with vomiting, unspecified: Secondary | ICD-10-CM | POA: Diagnosis not present

## 2015-03-04 DIAGNOSIS — I129 Hypertensive chronic kidney disease with stage 1 through stage 4 chronic kidney disease, or unspecified chronic kidney disease: Secondary | ICD-10-CM | POA: Insufficient documentation

## 2015-03-04 DIAGNOSIS — Z8719 Personal history of other diseases of the digestive system: Secondary | ICD-10-CM | POA: Diagnosis not present

## 2015-03-04 DIAGNOSIS — M549 Dorsalgia, unspecified: Secondary | ICD-10-CM | POA: Diagnosis not present

## 2015-03-04 DIAGNOSIS — Z79899 Other long term (current) drug therapy: Secondary | ICD-10-CM | POA: Diagnosis not present

## 2015-03-04 DIAGNOSIS — N189 Chronic kidney disease, unspecified: Secondary | ICD-10-CM | POA: Insufficient documentation

## 2015-03-04 DIAGNOSIS — M1990 Unspecified osteoarthritis, unspecified site: Secondary | ICD-10-CM | POA: Insufficient documentation

## 2015-03-04 DIAGNOSIS — R51 Headache: Secondary | ICD-10-CM | POA: Diagnosis not present

## 2015-03-04 DIAGNOSIS — R251 Tremor, unspecified: Secondary | ICD-10-CM | POA: Insufficient documentation

## 2015-03-04 DIAGNOSIS — R531 Weakness: Secondary | ICD-10-CM | POA: Diagnosis not present

## 2015-03-04 DIAGNOSIS — Z7982 Long term (current) use of aspirin: Secondary | ICD-10-CM | POA: Diagnosis not present

## 2015-03-04 DIAGNOSIS — Z9889 Other specified postprocedural states: Secondary | ICD-10-CM | POA: Insufficient documentation

## 2015-03-04 DIAGNOSIS — I639 Cerebral infarction, unspecified: Secondary | ICD-10-CM

## 2015-03-04 DIAGNOSIS — E669 Obesity, unspecified: Secondary | ICD-10-CM | POA: Diagnosis not present

## 2015-03-04 DIAGNOSIS — Z8701 Personal history of pneumonia (recurrent): Secondary | ICD-10-CM | POA: Insufficient documentation

## 2015-03-04 DIAGNOSIS — I480 Paroxysmal atrial fibrillation: Secondary | ICD-10-CM | POA: Diagnosis not present

## 2015-03-04 DIAGNOSIS — G9340 Encephalopathy, unspecified: Secondary | ICD-10-CM

## 2015-03-04 LAB — COMPREHENSIVE METABOLIC PANEL
ALBUMIN: 3.8 g/dL (ref 3.5–5.0)
ALK PHOS: 81 U/L (ref 38–126)
ALT: 11 U/L — AB (ref 14–54)
AST: 26 U/L (ref 15–41)
Anion gap: 15 (ref 5–15)
BILIRUBIN TOTAL: 0.9 mg/dL (ref 0.3–1.2)
BUN: 17 mg/dL (ref 6–20)
CALCIUM: 10.2 mg/dL (ref 8.9–10.3)
CO2: 23 mmol/L (ref 22–32)
CREATININE: 1.12 mg/dL — AB (ref 0.44–1.00)
Chloride: 99 mmol/L — ABNORMAL LOW (ref 101–111)
GFR calc Af Amer: 53 mL/min — ABNORMAL LOW (ref >60)
GFR calc non Af Amer: 46 mL/min — ABNORMAL LOW (ref >60)
GLUCOSE: 123 mg/dL — AB (ref 65–99)
Potassium: 4 mmol/L (ref 3.5–5.1)
SODIUM: 137 mmol/L (ref 135–145)
Total Protein: 6.8 g/dL (ref 6.5–8.1)

## 2015-03-04 LAB — BASIC METABOLIC PANEL
ANION GAP: 10 (ref 5–15)
BUN: 16 mg/dL (ref 6–20)
CHLORIDE: 101 mmol/L (ref 101–111)
CO2: 26 mmol/L (ref 22–32)
Calcium: 10.2 mg/dL (ref 8.9–10.3)
Creatinine, Ser: 1.1 mg/dL — ABNORMAL HIGH (ref 0.44–1.00)
GFR calc Af Amer: 54 mL/min — ABNORMAL LOW (ref >60)
GFR, EST NON AFRICAN AMERICAN: 47 mL/min — AB (ref >60)
GLUCOSE: 144 mg/dL — AB (ref 65–99)
POTASSIUM: 4 mmol/L (ref 3.5–5.1)
Sodium: 137 mmol/L (ref 135–145)

## 2015-03-04 LAB — CBC
HEMATOCRIT: 39.2 % (ref 36.0–46.0)
HEMOGLOBIN: 13.7 g/dL (ref 12.0–15.0)
MCH: 32.2 pg (ref 26.0–34.0)
MCHC: 34.9 g/dL (ref 30.0–36.0)
MCV: 92 fL (ref 78.0–100.0)
Platelets: 288 10*3/uL (ref 150–400)
RBC: 4.26 MIL/uL (ref 3.87–5.11)
RDW: 12.4 % (ref 11.5–15.5)
WBC: 8.4 10*3/uL (ref 4.0–10.5)

## 2015-03-04 LAB — URINE MICROSCOPIC-ADD ON

## 2015-03-04 LAB — HEPATIC FUNCTION PANEL
ALK PHOS: 81 U/L (ref 38–126)
ALT: 13 U/L — AB (ref 14–54)
AST: 28 U/L (ref 15–41)
Albumin: 3.8 g/dL (ref 3.5–5.0)
Bilirubin, Direct: 0.2 mg/dL (ref 0.1–0.5)
Indirect Bilirubin: 0.6 mg/dL (ref 0.3–0.9)
TOTAL PROTEIN: 6.8 g/dL (ref 6.5–8.1)
Total Bilirubin: 0.8 mg/dL (ref 0.3–1.2)

## 2015-03-04 LAB — URINALYSIS, ROUTINE W REFLEX MICROSCOPIC
BILIRUBIN URINE: NEGATIVE
GLUCOSE, UA: NEGATIVE mg/dL
Hgb urine dipstick: NEGATIVE
Ketones, ur: NEGATIVE mg/dL
LEUKOCYTES UA: NEGATIVE
NITRITE: NEGATIVE
PH: 8 (ref 5.0–8.0)
Protein, ur: 100 mg/dL — AB
SPECIFIC GRAVITY, URINE: 1.009 (ref 1.005–1.030)

## 2015-03-04 LAB — T4, FREE: Free T4: 0.91 ng/dL (ref 0.61–1.12)

## 2015-03-04 LAB — AMMONIA
AMMONIA: 17 umol/L (ref 9–35)
Ammonia: 115 umol/L — ABNORMAL HIGH (ref 9–35)

## 2015-03-04 LAB — CK: Total CK: 129 U/L (ref 38–234)

## 2015-03-04 LAB — I-STAT TROPONIN, ED: Troponin i, poc: 0.02 ng/mL (ref 0.00–0.08)

## 2015-03-04 LAB — TSH: TSH: 0.229 u[IU]/mL — ABNORMAL LOW (ref 0.350–4.500)

## 2015-03-04 MED ORDER — LIDOCAINE 5 % EX PTCH
1.0000 | MEDICATED_PATCH | CUTANEOUS | Status: DC
  Administered 2015-03-04: 1 via TRANSDERMAL
  Filled 2015-03-04: qty 1

## 2015-03-04 MED ORDER — HYDROMORPHONE HCL 2 MG PO TABS
2.0000 mg | ORAL_TABLET | Freq: Once | ORAL | Status: AC
  Administered 2015-03-04: 2 mg via ORAL
  Filled 2015-03-04: qty 1

## 2015-03-04 MED ORDER — ONDANSETRON HCL 4 MG/2ML IJ SOLN
4.0000 mg | Freq: Once | INTRAMUSCULAR | Status: AC
  Administered 2015-03-04: 4 mg via INTRAVENOUS
  Filled 2015-03-04: qty 2

## 2015-03-04 MED ORDER — SODIUM CHLORIDE 0.9 % IV BOLUS (SEPSIS)
1000.0000 mL | Freq: Once | INTRAVENOUS | Status: AC
  Administered 2015-03-04: 1000 mL via INTRAVENOUS

## 2015-03-04 NOTE — ED Notes (Addendum)
Lidocaine patch removed per patient request. Pt reports burning at patch site as well as increase in tremors.

## 2015-03-04 NOTE — ED Notes (Signed)
Called lab for update on ammonia level, states will result in another 20 min.

## 2015-03-04 NOTE — ED Notes (Signed)
PT ambulates to restroom. PT expresses that she is very tired of being in the ED and would like to go home. PT also requests something to eat. PT made aware that her EDP will be consulted about requests.

## 2015-03-04 NOTE — ED Provider Notes (Signed)
Patient's labs without acute findings. MRI results show no evidence of acute brain abnormality and C-spine imaging with increasingly worse disc disease but no acute issues.  Symptoms most likely today are from adverse medication reaction. Patient will stop this medication and adjust her trazodone and Dilaudid. She'll follow-up her providers. Patient was able to ambulate here without difficulty and is comfortable with this plan and going home.   Final result by Rad Results In Interface (03/04/15 20:04:36)   Narrative:   CLINICAL DATA: 81 year old female with weakness, tremors, nausea vomiting. Lower spine surgery earlier this month. Initial encounter.  EXAM: MRI CERVICAL SPINE WITHOUT CONTRAST  TECHNIQUE: Multiplanar, multisequence MR imaging of the cervical spine was performed. No intravenous contrast was administered.  COMPARISON: Brain MRI from today reported separately. Cervical spine MRI 09/15/2010.  FINDINGS: Cervical vertebral height and alignment is stable since 2012. No marrow edema or evidence of acute osseous abnormality.  Cervicomedullary junction is within normal limits. Mildly heterogeneous bone marrow signal in the clivus is unchanged and appears benign. Multilevel chronic cervical spine endplate degeneration.  Cervicomedullary junction is within normal limits. No cervical spinal cord signal abnormality.  Negative paraspinal soft tissues; subcentimeter bilateral thyroid nodules do not meet consensus criteria for ultrasound follow-up. Negative lung apices.  C2-C3: Stable mild disc bulge in the midline and ligament flavum hypertrophy without spinal stenosis. Left facet and uncovertebral hypertrophy, increased mild to moderate left C3 foraminal stenosis.  C3-C4: Chronic but increased central disc protrusion (series 6, image 10). Chronic but increased ligament flavum hypertrophy. Increased spinal stenosis with spinal cord mass effect. Mild to moderate facet  hypertrophy greater on the left where trace facet joint fluid is new. Moderate left and mild right C4 foraminal stenosis appears increased.  C4-C5: Chronic disc space loss and circumferential disc osteophyte complex with broad-based posterior component of disc. Mild ligament flavum hypertrophy appears stable. Spinal stenosis with up to mild spinal cord mass effect appears stable. Uncovertebral hypertrophy with moderate to severe C5 foraminal stenosis is stable.  C5-C6: Chronic disc space loss with mildly lobulated circumferential disc osteophyte complex appears stable along with mild spinal stenosis. Uncovertebral hypertrophy with moderate left and mild right C6 foraminal stenosis is stable.  C6-C7: Chronic disc space loss with circumferential disc osteophyte complex. Broad-based right posterior component of disc appears stable (series 6, image 26). Stable mild to moderate ligament flavum hypertrophy and mild spinal stenosis. Uncovertebral and facet hypertrophy with moderate bilateral C7 foraminal stenosis is stable.  C7-T1: Stable mild disc bulge. Stable moderate ligament flavum hypertrophy. No spinal stenosis. Uncovertebral and moderate facet hypertrophy with mild bilateral C8 foraminal stenosis is stable.  No upper thoracic spinal stenosis. Subtle central upper thoracic spinal cord T2 signal (T3-T4 series 2, image 5) is not visible on sagittal T1 images and likely mild prominence of the central spinal canal (normal variant).  IMPRESSION: 1. Progressed chronic spinal stenosis at C3-C4 with increased spinal cord mass effect, no cord signal abnormality. Moderate left and mild right C4 foraminal stenosis appears increased. 2. Stable chronic degenerative spinal stenosis from C4-C5 to C6-C7. 3. Increased mild to moderate left C3 foraminal stenosis related to endplate and facet hypertrophy. 4. Moderate to severe bilateral C5 if and moderate bilateral C7 foraminal stenosis is  stable.      Gwyneth Sprout, MD 03/04/15 [id number]

## 2015-03-04 NOTE — ED Notes (Signed)
Pt requesting update, informed pt of redraw of ammonia levels to determine if admission or not. Pt and family verbalized understanding.

## 2015-03-04 NOTE — ED Notes (Signed)
Patient up ambulatory to the bathroom using her cane for assistance; patient aware of need of urine specimen; patient will attempt to obtain urine; visitor at bedside

## 2015-03-04 NOTE — ED Notes (Signed)
Patient ambulated to the bathroom with her cane without any distress or difficulty and back to her room

## 2015-03-04 NOTE — ED Notes (Signed)
PT instructed to take in fluids and bland food. PT instructed to call her PCP in the AM to arrange for follow up. PT instructed to read over discharge papers. PT instructed to return to ED for new or worsening symptoms. PT aware she cannot drive or operate machinery while using narcotics. PT acknowledges instructions

## 2015-03-04 NOTE — ED Provider Notes (Signed)
CSN: [id number]     Arrival date & time 03/04/15  1200 History   First MD Initiated Contact with Patient 03/04/15 1331     Chief Complaint  Patient presents with  . Weakness     (Consider location/radiation/quality/duration/timing/severity/associated sxs/prior Treatment) HPI  81 year old female presents with diffuse weakness, intermittent tremors, and nausea/vomiting. Patient states that on 1/10 she had a low back operation by Dr. Lovell Sheehan. Has been doing okay but the hip pain she's been experiencing never seem to go away. She called her neurosurgeon 3 days ago. 2 days ago she filled a prescription for gabapentin and steroids. About 6 hours after taking the gabapentin for the first time she noticed the weakness and tremors. Last night she started having the vomiting. Vomiting has resolved after Zofran by EMS. Intermittently has tremors in all 4 extremities. Has developed a slight headache. Denies chest pain or shortness of breath. No diarrhea. Patient is also currently taking hydromorphone and trazodone.  Past Medical History  Diagnosis Date  . Hypertension   . Hyperlipidemia   . PAF (paroxysmal atrial fibrillation) (HCC)   . Obesity   . History of heartburn   . High risk medication use     Flecainide therapy  . Syncope July 2012  . Normal nuclear stress test July 2012  . LVH (left ventricular hypertrophy)     Per echo in July 2012  . Diastolic dysfunction     Grade I, per echo in 2012  . CAD (coronary artery disease)     Mild per remote cath in 2004; normal nuclear in 2012  . Complication of anesthesia     slow to wake up  . Dysrhythmia   . Pneumonia     20 yrs ago  . GERD (gastroesophageal reflux disease)     prevacid  . H/O hiatal hernia   . Headache(784.0)     sinus  . Arthritis   . Chronic kidney disease     stage3 kidney disease    Past Surgical History  Procedure Laterality Date  . Cardiac catheterization  07/28/2002    EF GREATER THAN 55%; MILD CAD  . Back  surgery  1980, 2010    x2  . Tonsillectomy      age 12 yrs  . Appendectomy  1952  . Tubal ligation    . Dilation and curettage of uterus    . Colonoscopy    . Lumbar laminectomy/decompression microdiscectomy Right 02/15/2015    Procedure: Right Thoracic twelve-Lumbar one  Microdiskectomy;  Surgeon: Tressie Stalker, MD;  Location: [facility];  Service: Neurosurgery;  Laterality: Right;  Thoracic/Lumbar   Family History  Problem Relation Age of Onset  . Lung cancer Father    Social History  Substance Use Topics  . Smoking status: Never Smoker   . Smokeless tobacco: Never Used  . Alcohol Use: No   OB History    No data available     Review of Systems  Constitutional: Negative for fever.  Eyes: Negative for visual disturbance.  Respiratory: Negative for shortness of breath.   Cardiovascular: Negative for chest pain.  Gastrointestinal: Positive for vomiting. Negative for diarrhea.  Musculoskeletal: Positive for back pain.  Neurological: Positive for tremors, weakness and headaches. Negative for dizziness and light-headedness.  All other systems reviewed and are negative.     Allergies  Amlodipine besylate; Codeine phosphate; Lipitor; and Sulfonamide derivatives  Home Medications   Prior to Admission medications   Medication Sig Start Date End Date Taking? Authorizing  Provider  aspirin EC 81 MG tablet Take 81 mg by mouth daily.   Yes Historical Provider, MD  Biotin 10 MG CAPS Take 10 mg by mouth daily.   Yes Historical Provider, MD  Cholecalciferol (VITAMIN D) 2000 UNITS tablet Take 2,000 Units by mouth daily.     Yes Historical Provider, MD  docusate sodium (COLACE) 100 MG capsule Take 1 capsule (100 mg total) by mouth 2 (two) times daily. 02/16/15  Yes Tressie Stalker, MD  flecainide (TAMBOCOR) 50 MG tablet take 1 tablet by mouth once daily 11/20/14  Yes Jake Bathe, MD  gabapentin (NEURONTIN) 300 MG capsule Take 300 mg by mouth 3 (three) times daily.   Yes Historical  Provider, MD  HYDROmorphone (DILAUDID) 2 MG tablet Take 1 tablet (2 mg total) by mouth every 4 (four) hours as needed for severe pain. 02/16/15  Yes Tressie Stalker, MD  loratadine (CLARITIN) 10 MG tablet Take 10 mg by mouth daily as needed for allergies.   Yes Historical Provider, MD  metoprolol succinate (TOPROL-XL) 25 MG 24 hr tablet Take 25 mg by mouth daily. 05/28/14  Yes Historical Provider, MD  ondansetron (ZOFRAN) 4 MG tablet Take 1 tablet (4 mg total) by mouth every 8 (eight) hours as needed for nausea or vomiting. 01/28/15  Yes Lavera Guise, MD  traZODone (DESYREL) 100 MG tablet Take 100 mg by mouth at bedtime.     Yes Historical Provider, MD   BP 227/118 mmHg  Pulse 72  Temp(Src) 98 F (36.7 C) (Oral)  Resp 17  Ht  (1.6 m)  Wt 152 lb (68.947 kg)  BMI 26.93 kg/m2  SpO2 98% Physical Exam  Constitutional: She is oriented to person, place, and time. She appears well-developed and well-nourished.  HENT:  Head: Normocephalic and atraumatic.  Right Ear: External ear normal.  Left Ear: External ear normal.  Nose: Nose normal.  Eyes: EOM are normal. Pupils are equal, round, and reactive to light. Right eye exhibits no discharge. Left eye exhibits no discharge. Right eye exhibits no nystagmus. Left eye exhibits no nystagmus.  Neck: Neck supple.  Cardiovascular: Normal rate, regular rhythm and normal heart sounds.   Pulmonary/Chest: Effort normal and breath sounds normal.  Abdominal: Soft. There is no tenderness.  Neurological: She is alert and oriented to person, place, and time. She displays no Babinski's sign on the right side. She displays no Babinski's sign on the left side.  Reflex Scores:      Bicep reflexes are 2+ on the right side and 2+ on the left side.      Patellar reflexes are 2+ on the right side and 2+ on the left side.      Achilles reflexes are 2+ on the right side and 2+ on the left side. CN 2-12 grossly intact. 5/5 strength in all 4 extremities. ?Clonus.  Intermittent spontaneous tremor/shake of RLE  Skin: Skin is warm and dry.  Nursing note and vitals reviewed.   ED Course  Procedures (including critical care time) Labs Review Labs Reviewed  BASIC METABOLIC PANEL - Abnormal; Notable for the following:    Glucose, Bld 144 (*)    Creatinine, Ser 1.10 (*)    GFR calc non Af Amer 47 (*)    GFR calc Af Amer 54 (*)    All other components within normal limits  URINALYSIS, ROUTINE W REFLEX MICROSCOPIC (NOT AT [facility]) - Abnormal; Notable for the following:    Protein, ur 100 (*)    All  other components within normal limits  HEPATIC FUNCTION PANEL - Abnormal; Notable for the following:    ALT 13 (*)    All other components within normal limits  COMPREHENSIVE METABOLIC PANEL - Abnormal; Notable for the following:    Chloride 99 (*)    Glucose, Bld 123 (*)    Creatinine, Ser 1.12 (*)    ALT 11 (*)    GFR calc non Af Amer 46 (*)    GFR calc Af Amer 53 (*)    All other components within normal limits  URINE MICROSCOPIC-ADD ON - Abnormal; Notable for the following:    Squamous Epithelial / LPF 0-5 (*)    Bacteria, UA RARE (*)    All other components within normal limits  CBC  CK  AMMONIA  TSH  URINALYSIS, ROUTINE W REFLEX MICROSCOPIC (NOT AT [facility])  I-STAT TROPOININ, ED    Imaging Review No results found. I have personally reviewed and evaluated these images and lab results as part of my medical decision-making.   EKG Interpretation   Date/Time:  Thursday March 04 2015 12:04:46 EST Ventricular Rate:  69 PR Interval:  200 QRS Duration: 100 QT Interval:  456 QTC Calculation: 488 R Axis:   -69 Text Interpretation:  Normal sinus rhythm Left anterior fascicular block  Septal infarct , age undetermined Abnormal ECG baseline artifact limits  interpretation Confirmed by Phyillis Dascoli  MD, Promyse Ardito ([id number]) on 03/04/2015  1:28:09 PM       EKG Interpretation  Date/Time:  Thursday March 04 2015 12:04:46 EST Ventricular Rate:  69 PR  Interval:  200 QRS Duration: 100 QT Interval:  456 QTC Calculation: 488 R Axis:   -69 Text Interpretation:  Normal sinus rhythm Left anterior fascicular block Septal infarct , age undetermined Abnormal ECG baseline artifact limits interpretation Confirmed by Judaea Burgoon  MD, Rhylan Gross ([id number]) on 03/04/2015 1:28:09 PM       MDM   Final diagnoses:  None    Patient is not ill appearing here. She does not have tachycardia, dilated pupils, diaphoresis, or fever. Question a possible serotonin related process given she is on trazodone and newly started on gabapentin. She does not have rigid muscles, hyperreflexia, or other concerning findings. Questionable clonus. Discussed with neurology.Dr. Rosalia Hammers recommends MRI, labs, and stopping gabapentin. Reduce dilaudid dose, cut trazadone dose in half, and start lidocaine patch for SI joint pain. If MRI and labwork benign, neuro recommends d/c home. Ambulates normally. Labwork unremarkable so far. MRI pending, care transferred to Dr. Anitra Lauth.  Pricilla Loveless, MD 03/04/15 1556

## 2015-03-04 NOTE — Consult Note (Signed)
NEURO HOSPITALIST CONSULT NOTE   Requestig physician: Dr. Criss Alvine   Reason for Consult: polymyoclonus  HPI:                                                                                                                                          Cheryl [name] is an 81 y.o. female presenting to the hospital after taking Neurontin.  Post taking the medication she noted she had some jerking throughout her body that has now improved significantly.  She states she had taken her first dose of Neurontin and about 4 hours later noticed these symptoms. She denies any other symptoms other than pain but this is chronic. Emergency MD was questioning possible neuroleptic syndome.   Past Medical History  Diagnosis Date  . Hypertension   . Hyperlipidemia   . PAF (paroxysmal atrial fibrillation) (HCC)   . Obesity   . History of heartburn   . High risk medication use     Flecainide therapy  . Syncope July 2012  . Normal nuclear stress test July 2012  . LVH (left ventricular hypertrophy)     Per echo in July 2012  . Diastolic dysfunction     Grade I, per echo in 2012  . CAD (coronary artery disease)     Mild per remote cath in 2004; normal nuclear in 2012  . Complication of anesthesia     slow to wake up  . Dysrhythmia   . Pneumonia     20 yrs ago  . GERD (gastroesophageal reflux disease)     prevacid  . H/O hiatal hernia   . Headache(784.0)     sinus  . Arthritis   . Chronic kidney disease     stage3 kidney disease     Past Surgical History  Procedure Laterality Date  . Cardiac catheterization  07/28/2002    EF GREATER THAN 55%; MILD CAD  . Back surgery  1980, 2010    x2  . Tonsillectomy      age 81 yrs  . Appendectomy  1952  . Tubal ligation    . Dilation and curettage of uterus    . Colonoscopy    . Lumbar laminectomy/decompression microdiscectomy Right 02/15/2015    Procedure: Right Thoracic twelve-Lumbar one  Microdiskectomy;  Surgeon: Tressie Stalker, MD;   Location: [facility];  Service: Neurosurgery;  Laterality: Right;  Thoracic/Lumbar    Family History  Problem Relation Age of Onset  . Lung cancer Father     Social History:  reports that she has never smoked. She has never used smokeless tobacco. She reports that she does not drink alcohol or use illicit drugs.  Allergies  Allergen Reactions  . Amlodipine Besylate     REACTION: dizzy  . Codeine Phosphate  headache  . Lipitor [Atorvastatin Calcium]     Muscle pain and tiredness  . Sulfonamide Derivatives     REACTION: unspecified    MEDICATIONS:                                                                                                                     No current facility-administered medications for this encounter.   Current Outpatient Prescriptions  Medication Sig Dispense Refill  . aspirin EC 81 MG tablet Take 81 mg by mouth daily.    . Biotin 10 MG CAPS Take 10 mg by mouth daily.    . Cholecalciferol (VITAMIN D) 2000 UNITS tablet Take 2,000 Units by mouth daily.      Marland Kitchen docusate sodium (COLACE) 100 MG capsule Take 1 capsule (100 mg total) by mouth 2 (two) times daily. 60 capsule 0  . flecainide (TAMBOCOR) 50 MG tablet take 1 tablet by mouth once daily 90 tablet 3  . gabapentin (NEURONTIN) 300 MG capsule Take 300 mg by mouth 3 (three) times daily.    Marland Kitchen HYDROmorphone (DILAUDID) 2 MG tablet Take 1 tablet (2 mg total) by mouth every 4 (four) hours as needed for severe pain. 50 tablet 0  . loratadine (CLARITIN) 10 MG tablet Take 10 mg by mouth daily as needed for allergies.    . methylPREDNISolone (MEDROL DOSEPAK) 4 MG TBPK tablet Take 4 mg by mouth daily.  0  . metoprolol succinate (TOPROL-XL) 25 MG 24 hr tablet Take 25 mg by mouth daily.  0  . ondansetron (ZOFRAN) 4 MG tablet Take 1 tablet (4 mg total) by mouth every 8 (eight) hours as needed for nausea or vomiting. 12 tablet 0  . traZODone (DESYREL) 100 MG tablet Take 100 mg by mouth at bedtime.          ROS:                                                                                                                                        History obtained from the patient  General ROS: negative for - chills, fatigue, fever, night sweats, weight gain or weight loss Psychological ROS: negative for - behavioral disorder, hallucinations, memory difficulties, mood swings or suicidal ideation Ophthalmic ROS: negative for - blurry vision, double vision, eye pain or loss of vision ENT ROS: negative for - epistaxis, nasal discharge, oral lesions, sore throat, tinnitus or vertigo  Allergy and Immunology ROS: negative for - hives or itchy/watery eyes Hematological and Lymphatic ROS: negative for - bleeding problems, bruising or swollen lymph nodes Endocrine ROS: negative for - galactorrhea, hair pattern changes, polydipsia/polyuria or temperature intolerance Respiratory ROS: negative for - cough, hemoptysis, shortness of breath or wheezing Cardiovascular ROS: negative for - chest pain, dyspnea on exertion, edema or irregular heartbeat Gastrointestinal ROS: negative for - abdominal pain, diarrhea, hematemesis, nausea/vomiting or stool incontinence Genito-Urinary ROS: negative for - dysuria, hematuria, incontinence or urinary frequency/urgency Musculoskeletal ROS: negative for - joint swelling or muscular weakness Neurological ROS: as noted in HPI Dermatological ROS: negative for rash and skin lesion changes   Blood pressure 207/90, pulse 63, temperature 98 F (36.7 C), temperature source Oral, resp. rate 18, height  (1.6 m), weight 68.947 kg (152 lb), SpO2 100 %.   Neurologic Examination:                                                                                                      HEENT-  Normocephalic, no lesions, without obvious abnormality.  Normal external eye and conjunctiva.  Normal TM's bilaterally.  Normal auditory canals and external ears. Normal external nose, mucus membranes and septum.   Normal pharynx. Cardiovascular- S1, S2 normal, pulses palpable throughout   Lungs- chest clear, no wheezing, rales, normal symmetric air entry Abdomen- normal findings: bowel sounds normal Extremities- no edema Lymph-no adenopathy palpable Musculoskeletal-no joint tenderness, deformity or swelling Skin-warm and dry, no hyperpigmentation, vitiligo, or suspicious lesions  Neurological Examination Mental Status: Alert, oriented, thought content appropriate.  Speech fluent without evidence of aphasia.  Able to follow 3 step commands without difficulty. Cranial Nerves: II: Discs flat bilaterally; Visual fields grossly normal, pupils equal, round, reactive to light and accommodation III,IV, VI: ptosis not present, extra-ocular motions intact bilaterally V,VII: smile symmetric, facial light touch sensation normal bilaterally VIII: hearing normal bilaterally IX,X: uvula rises symmetrically XI: bilateral shoulder shrug XII: midline tongue extension Motor: Right : Upper extremity   5/5    Left:     Upper extremity   5/5  Lower extremity   5/5     Lower extremity   5/5 --both positive and negative myoclonus --Tone and bulk:normal tone throughout; no atrophy noted Sensory: Pinprick and light touch intact throughout, bilaterally Deep Tendon Reflexes: 2+ and symmetric throughout Plantars: Right: downgoing   Left: downgoing Cerebellar: normal finger-to-nose and normal heel-to-shin test Gait: normal gait and station      Lab Results: Basic Metabolic Panel:  Recent Labs Lab 03/04/15 1204  NA 137  K 4.0  CL 101  CO2 26  GLUCOSE 144*  BUN 16  CREATININE 1.10*  CALCIUM 10.2    Liver Function Tests: No results for input(s): AST, ALT, ALKPHOS, BILITOT, PROT, ALBUMIN in the last 168 hours. No results for input(s): LIPASE, AMYLASE in the last 168 hours. No results for input(s): AMMONIA in the last 168 hours.  CBC:  Recent Labs Lab 03/04/15 1204  WBC 8.4  HGB 13.7  HCT 39.2   MCV 92.0  PLT 288  Cardiac Enzymes: No results for input(s): CKTOTAL, CKMB, CKMBINDEX, TROPONINI in the last 168 hours.  Lipid Panel: No results for input(s): CHOL, TRIG, HDL, CHOLHDL, VLDL, LDLCALC in the last 168 hours.  CBG: No results for input(s): GLUCAP in the last 168 hours.  Microbiology: Results for orders placed or performed during the hospital encounter of 02/05/15  Surgical pcr screen     Status: None   Collection Time: 02/05/15  1:30 PM  Result Value Ref Range Status   MRSA, PCR NEGATIVE NEGATIVE Final   Staphylococcus aureus NEGATIVE NEGATIVE Final    Comment:        The Xpert SA Assay (FDA approved for NASAL specimens in patients over 40 years of age), is one component of a comprehensive surveillance program.  Test performance has been validated by [facility] for patients greater than or equal to 84 year old. It is not intended to diagnose infection nor to guide or monitor treatment.     Coagulation Studies: No results for input(s): LABPROT, INR in the last 72 hours.  Imaging: No results found.     Assessment and plan per attending neurologist  Felicie Morn PA-C [facility] [phone]  03/04/2015, 2:48 PM   Assessment/Plan: 81 YO female with encephalopathy and myoclonus secondary to multiple neuroleptic medications.   Recommend: 1)

## 2015-03-04 NOTE — ED Notes (Signed)
Baird Lyons in main lab states will add on t3/t4 to previous blood draws.

## 2015-03-04 NOTE — Discharge Instructions (Signed)
Do not take the Gabapentin (Neurontin). Cut your trazadone dose in half (50 mg). Take your dilaudid every 8 hours instead of every 4 hours. Follow up with your primary doctor if your symptoms do not improve. If any symptoms worse return to the ER

## 2015-03-04 NOTE — ED Notes (Signed)
Patient came to nurse first desk and states she can't wait for a room any longer.   Patient states that she doesn't feel well enough to wait.   We are going to recheck vitals.   Patient advised next to go back.  Still waiting on room.

## 2015-03-04 NOTE — ED Notes (Addendum)
Per EMS: pt from home for eval of generalized weakness and nausea since back surgery on 02/15/15. Pt has had a lot of medication changes since and has recently been started on gabapetin. Pt reports left arm tremors that come and go and report some slurred speech last night at 1800. Pt has no neuro deficits with EMS or at this time. Speech back to baseline per pt. Axo x4. Hypertensive with EMS and in triage, states has not had HTN meds, given  zofran en route.

## 2015-05-13 ENCOUNTER — Ambulatory Visit: Payer: Medicare Other | Attending: Physical Medicine and Rehabilitation | Admitting: Physical Therapy

## 2015-05-13 ENCOUNTER — Encounter: Payer: Self-pay | Admitting: Physical Therapy

## 2015-05-13 DIAGNOSIS — M545 Low back pain, unspecified: Secondary | ICD-10-CM

## 2015-05-13 NOTE — Therapy (Signed)
[facility] [address] [place], [place], [address] Phone: [phone]   Fax:  [phone]  Physical Therapy Evaluation  Patient Details  Name: Cheryl [name] MRN: [id number] Date of Birth: 05-17-1934 Referring Provider: Naaman Plummer  Encounter Date: 05/13/2015      PT End of Session - 05/13/15 1411    Date for PT Re-Evaluation 07/13/15   Authorization Time Period 1   PT Start Time 1337   PT Stop Time 1431   PT Time Calculation (min) 54 min   Equipment Utilized During Treatment Back brace   Activity Tolerance Patient tolerated treatment well   Behavior During Therapy [facility] for tasks assessed/performed      Past Medical History  Diagnosis Date  . Hypertension   . Hyperlipidemia   . PAF (paroxysmal atrial fibrillation) (HCC)   . Obesity   . History of heartburn   . High risk medication use     Flecainide therapy  . Syncope July 2012  . Normal nuclear stress test July 2012  . LVH (left ventricular hypertrophy)     Per echo in July 2012  . Diastolic dysfunction     Grade I, per echo in 2012  . CAD (coronary artery disease)     Mild per remote cath in 2004; normal nuclear in 2012  . Complication of anesthesia     slow to wake up  . Dysrhythmia   . Pneumonia     20 yrs ago  . GERD (gastroesophageal reflux disease)     prevacid  . H/O hiatal hernia   . Headache(784.0)     sinus  . Arthritis   . Chronic kidney disease     stage3 kidney disease     Past Surgical History  Procedure Laterality Date  . Cardiac catheterization  07/28/2002    EF GREATER THAN 55%; MILD CAD  . Back surgery  1980, 2010    x2  . Tonsillectomy      age 81 yrs  . Appendectomy  1952  . Tubal ligation    . Dilation and curettage of uterus    . Colonoscopy    . Lumbar laminectomy/decompression microdiscectomy Right 02/15/2015    Procedure: Right Thoracic twelve-Lumbar one  Microdiskectomy;  Surgeon: Tressie Stalker, MD;  Location:  [facility];  Service: Neurosurgery;  Laterality: Right;  Thoracic/Lumbar    There were no vitals filed for this visit.  Visit Diagnosis:  Bilateral low back pain without sciatica - Plan: PT plan of care cert/re-cert      Subjective Assessment - 05/13/15 1340    Subjective Patient has had chronic low back pain for a number of years.  She had a 2 level lumbar fusion in 2015.  She had PT here with good results and no further issues, she had a fall in October and had a laminectomy/decompression/descectomy in January.  She reports that she has remained in significant pain   Limitations Lifting;Standing;Walking   Patient Stated Goals have less pain   Currently in Pain? Yes   Pain Score 6    Pain Location Back   Pain Orientation Lower   Pain Descriptors / Indicators Aching;Spasm   Pain Type Chronic pain   Pain Onset More than a month ago   Pain Frequency Constant   Aggravating Factors  standing, arching pain can be up to 8-9/10 with spasms    Pain Relieving Factors wearing a brace, easy motions and pain medications pain can be  down to 3-4/10   Effect of Pain on Daily Activities limits everything            [facility] PT Assessment - 05/13/15 0001    Assessment   Medical Diagnosis low back pain   Referring Provider Naaman Plummer   Onset Date/Surgical Date 04/12/15   Prior Therapy no   Precautions   Precautions None   Precaution Comments wears a corset brace when she is active   Balance Screen   Has the patient fallen in the past 6 months Yes   How many times? 1   Has the patient had a decrease in activity level because of a fear of falling?  No   Is the patient reluctant to leave their home because of a fear of falling?  No   Home Environment   Additional Comments does some housework   Prior Function   Level of Independence Independent   Leisure no exercise   Posture/Postural Control   Posture Comments fwd head, some rounded shoulders and scoliosis   AROM   Overall AROM  Comments Lumbar ROM decreased 50% for flexion and sidebending, decreased 100% for extension all motions cause some pulling   Strength   Overall Strength Comments LE strength 3+/5   Flexibility   Soft Tissue Assessment /Muscle Length --  some tightness in the calves, HS and piriformis mms   Palpation   Palpation comment she is very tender in the L/S area, spasms in the lumbar area   Special Tests    Special Tests --  some pain with left slump test                   [facility] Adult PT Treatment/Exercise - 05/13/15 0001    Modalities   Modalities Electrical Stimulation;Moist Heat   Moist Heat Therapy   Number Minutes Moist Heat 15 Minutes   Moist Heat Location Lumbar Spine   Electrical Stimulation   Electrical Stimulation Location lumbar   Electrical Stimulation Action IFC   Electrical Stimulation Parameters supine   Electrical Stimulation Goals Pain                PT Education - 05/13/15 1411    Education provided Yes   Education Details WMs flexion exercises   Person(s) Educated Patient   Methods Explanation;Demonstration;Handout   Comprehension Verbalized understanding          PT Short Term Goals - 05/13/15 1415    PT SHORT TERM GOAL #1   Title independent with initial HEP   Time 1   Period Weeks   Status New           PT Long Term Goals - 05/13/15 1415    PT LONG TERM GOAL #1   Title understand proper posture and body mechanics   Time 8   Period Weeks   Status New   PT LONG TERM GOAL #2   Title increase lumbar ROM 25%   Time 8   Period Weeks   Status New   PT LONG TERM GOAL #3   Title decrease pain 25%   Time 8   Period Weeks   Status New   PT LONG TERM GOAL #4   Title report taking 1/2 as much medicine, she is taking 1 1/2 pain pills a day currently at eval   Time 8   Period Weeks   Status New   PT LONG TERM GOAL #5   Title report sleep is 25% better   Time  8   Period Weeks   Status New               Plan -  05/13/15 1412    Clinical Impression Statement Patient with 2 lumbar surgeries in the past 2 years.  Her most recent surgery was in January was a laminectomy/decompression.  She reports that she has been in pain since the surgery and it has now relented.  She has lumbar spasms, and decreased ROM.  She is very timid with her motions.   Pt will benefit from skilled therapeutic intervention in order to improve on the following deficits Decreased activity tolerance;Decreased mobility;Decreased range of motion;Decreased safety awareness;Decreased strength;Difficulty walking;Increased muscle spasms;Impaired flexibility;Postural dysfunction;Improper body mechanics;Pain   Rehab Potential Good   PT Frequency 2x / week   PT Duration 8 weeks   PT Treatment/Interventions ADLs/Self Care Home Management;Electrical Stimulation;Moist Heat;Therapeutic exercise;Manual techniques;Functional mobility training;Neuromuscular re-education;Ultrasound;Patient/family education;Passive range of motion   PT Next Visit Plan slowly add exercises, go over posture and body mechanics   Consulted and Agree with Plan of Care Patient          G-Codes - 05/13/15 1422    Functional Assessment Tool Used Foto 65% limitation   Functional Limitation Other PT primary   Other PT Primary Current Status ([id number]) At least 60 percent but less than 80 percent impaired, limited or restricted   Other PT Primary Goal Status (H0865(G8991) At least 40 percent but less than 60 percent impaired, limited or restricted       Problem List Patient Active Problem List   Diagnosis Date Noted  . Lumbar herniated disc 02/15/2015  . CKD (chronic kidney disease) stage 3, GFR 30-59 ml/min 06/22/2014  . Spondylolisthesis of lumbar region 08/07/2013  . Hyperlipidemia 06/13/2008  . ATRIAL FIBRILLATION 06/13/2008  . Essential hypertension 11/05/2006  . Coronary atherosclerosis 11/05/2006    Jearld LeschALBRIGHT,Neya Creegan W., PT 05/13/2015, 2:24 PM  [facility] [address] [place], [place], [address] Phone: [phone]   Fax:  [phone]  Name: Ivery QualeLina B Reading MRN: [id number] Date of Birth: February 19, 1934

## 2015-05-18 ENCOUNTER — Ambulatory Visit: Payer: Medicare Other | Admitting: Physical Therapy

## 2015-05-18 DIAGNOSIS — M545 Low back pain, unspecified: Secondary | ICD-10-CM

## 2015-05-18 NOTE — Therapy (Signed)
[facility] [address] [place], [place], [address] Phone: [phone]   Fax:  [phone]  Physical Therapy Treatment  Patient Details  Name: Cheryl [name] MRN: [id number] Date of Birth: 1934/08/24 Referring Provider: Naaman PlummerFred Newton  Encounter Date: 05/18/2015      PT End of Session - 05/18/15 1018    Visit Number 2   Date for PT Re-Evaluation 07/13/15   PT Start Time 1016   PT Stop Time 1116   PT Time Calculation (min) 60 min      Past Medical History  Diagnosis Date  . Hypertension   . Hyperlipidemia   . PAF (paroxysmal atrial fibrillation) (HCC)   . Obesity   . History of heartburn   . High risk medication use     Flecainide therapy  . Syncope July 2012  . Normal nuclear stress test July 2012  . LVH (left ventricular hypertrophy)     Per echo in July 2012  . Diastolic dysfunction     Grade I, per echo in 2012  . CAD (coronary artery disease)     Mild per remote cath in 2004; normal nuclear in 2012  . Complication of anesthesia     slow to wake up  . Dysrhythmia   . Pneumonia     20 yrs ago  . GERD (gastroesophageal reflux disease)     prevacid  . H/O hiatal hernia   . Headache(784.0)     sinus  . Arthritis   . Chronic kidney disease     stage3 kidney disease     Past Surgical History  Procedure Laterality Date  . Cardiac catheterization  07/28/2002    EF GREATER THAN 55%; MILD CAD  . Back surgery  1980, 2010    x2  . Tonsillectomy      age 34 yrs  . Appendectomy  1952  . Tubal ligation    . Dilation and curettage of uterus    . Colonoscopy    . Lumbar laminectomy/decompression microdiscectomy Right 02/15/2015    Procedure: Right Thoracic twelve-Lumbar one  Microdiskectomy;  Surgeon: Tressie StalkerJeffrey Jenkins, MD;  Location: [facility];  Service: Neurosurgery;  Laterality: Right;  Thoracic/Lumbar    There were no vitals filed for this visit.      Subjective Assessment - 05/18/15 1018    Subjective took meds this morning.    Currently in Pain? Yes   Pain Score 6    Pain Location Back   Pain Orientation Right   Multiple Pain Sites No                         [facility] Adult PT Treatment/Exercise - 05/18/15 0001    Lumbar Exercises: Stretches   Passive Hamstring Stretch 60 seconds  bilateral   Single Knee to Chest Stretch 10 seconds  X 10   Double Knee to Chest Stretch 10 seconds  10X   Pelvic Tilt Limitations 10 reps   Piriformis Stretch 60 seconds  bilatera   Moist Heat Therapy   Number Minutes Moist Heat 15 Minutes   Moist Heat Location Lumbar Spine   Electrical Stimulation   Electrical Stimulation Location lum   Electrical Stimulation Action IFC   Electrical Stimulation Parameters supine   Electrical Stimulation Goals Pain                PT Education - 05/18/15 1052    Education provided Yes  Person(s) Educated Patient   Methods Explanation;Demonstration          PT Short Term Goals - 05/13/15 1415    PT SHORT TERM GOAL #1   Title independent with initial HEP   Time 1   Period Weeks   Status New           PT Long Term Goals - 05/13/15 1415    PT LONG TERM GOAL #1   Title understand proper posture and body mechanics   Time 8   Period Weeks   Status New   PT LONG TERM GOAL #2   Title increase lumbar ROM 25%   Time 8   Period Weeks   Status New   PT LONG TERM GOAL #3   Title decrease pain 25%   Time 8   Period Weeks   Status New   PT LONG TERM GOAL #4   Title report taking 1/2 as much medicine, she is taking 1 1/2 pain pills a day currently at eval   Time 8   Period Weeks   Status New   PT LONG TERM GOAL #5   Title report sleep is 25% better   Time 8   Period Weeks   Status New               Plan - 05/18/15 1053    Clinical Impression Statement --   PT Next Visit Plan slowly add exercises, go over posture and body mechanics      Patient will benefit from skilled therapeutic intervention  in order to improve the following deficits and impairments:     Visit Diagnosis: Bilateral low back pain without sciatica     Problem List Patient Active Problem List   Diagnosis Date Noted  . Lumbar herniated disc 02/15/2015  . CKD (chronic kidney disease) stage 3, GFR 30-59 ml/min 06/22/2014  . Spondylolisthesis of lumbar region 08/07/2013  . Hyperlipidemia 06/13/2008  . ATRIAL FIBRILLATION 06/13/2008  . Essential hypertension 11/05/2006  . Coronary atherosclerosis 11/05/2006    Lady Deutscher, PTA   05/18/2015, 10:56 AM  [facility] [address] [place], [place], [address] Phone: [phone]   Fax:  [phone]  Name: Cheryl [name] MRN: [id number] Date of Birth: [birth year]

## 2015-05-18 NOTE — Patient Instructions (Signed)
Explained washing and drying clothes without twisting. We also discussed posture.

## 2015-05-20 ENCOUNTER — Ambulatory Visit: Payer: Medicare Other | Admitting: Physical Therapy

## 2015-05-20 DIAGNOSIS — M545 Low back pain, unspecified: Secondary | ICD-10-CM

## 2015-05-20 NOTE — Therapy (Signed)
[facility] [address] [place], [place], [address] Phone: [phone]   Fax:  [phone]  Physical Therapy Treatment  Patient Details  Name: Cheryl [name] MRN: [id number] Date of Birth: 1934/02/23 Referring Provider: Naaman Plummer  Encounter Date: 05/20/2015      PT End of Session - 05/20/15 1400    Visit Number 3   Date for PT Re-Evaluation 07/13/15   PT Start Time 1401   PT Stop Time 1501   PT Time Calculation (min) 60 min      Past Medical History  Diagnosis Date  . Hypertension   . Hyperlipidemia   . PAF (paroxysmal atrial fibrillation) (HCC)   . Obesity   . History of heartburn   . High risk medication use     Flecainide therapy  . Syncope July 2012  . Normal nuclear stress test July 2012  . LVH (left ventricular hypertrophy)     Per echo in July 2012  . Diastolic dysfunction     Grade I, per echo in 2012  . CAD (coronary artery disease)     Mild per remote cath in 2004; normal nuclear in 2012  . Complication of anesthesia     slow to wake up  . Dysrhythmia   . Pneumonia     20 yrs ago  . GERD (gastroesophageal reflux disease)     prevacid  . H/O hiatal hernia   . Headache(784.0)     sinus  . Arthritis   . Chronic kidney disease     stage3 kidney disease     Past Surgical History  Procedure Laterality Date  . Cardiac catheterization  07/28/2002    EF GREATER THAN 55%; MILD CAD  . Back surgery  1980, 2010    x2  . Tonsillectomy      age 70 yrs  . Appendectomy  1952  . Tubal ligation    . Dilation and curettage of uterus    . Colonoscopy    . Lumbar laminectomy/decompression microdiscectomy Right 02/15/2015    Procedure: Right Thoracic twelve-Lumbar one  Microdiskectomy;  Surgeon: Tressie Stalker, MD;  Location: [facility];  Service: Neurosurgery;  Laterality: Right;  Thoracic/Lumbar    There were no vitals filed for this visit.      Subjective Assessment - 05/20/15 1401     Subjective felt good after last treatment. 5/10 today. took meds this morning. meds takes away the sharpe pains. More on left side today especially with standing for a few minutes.    Pain Score 5    Pain Location Back   Pain Orientation Left   Multiple Pain Sites No            [facility] PT Assessment - 05/20/15 0001    Palpation   SI assessment  Slightly shorter on Right side with Leg Lenght                     [facility] Adult PT Treatment/Exercise - 05/20/15 0001    Lumbar Exercises: Supine   Ab Set 20 reps  PPT with TrAb   Clam Limitations --   Bridge 10 reps  with ball squeeze and low lifts to engage glutes   Other Supine Lumbar Exercises SKTC, DKTC,    Lumbar Exercises: Sidelying   Clam 10 reps  bilateral   Other Sidelying Lumbar Exercises S/L on Left side to open Right side QL   Moist Heat Therapy  Number Minutes Moist Heat 15 Minutes   Moist Heat Location Lumbar Spine   Electrical Stimulation   Electrical Stimulation Location lum   Electrical Stimulation Action IFC   Electrical Stimulation Parameters supine   Electrical Stimulation Goals Pain                  PT Short Term Goals - 05/13/15 1415    PT SHORT TERM GOAL #1   Title independent with initial HEP   Time 1   Period Weeks   Status New           PT Long Term Goals - 05/13/15 1415    PT LONG TERM GOAL #1   Title understand proper posture and body mechanics   Time 8   Period Weeks   Status New   PT LONG TERM GOAL #2   Title increase lumbar ROM 25%   Time 8   Period Weeks   Status New   PT LONG TERM GOAL #3   Title decrease pain 25%   Time 8   Period Weeks   Status New   PT LONG TERM GOAL #4   Title report taking 1/2 as much medicine, she is taking 1 1/2 pain pills a day currently at eval   Time 8   Period Weeks   Status New   PT LONG TERM GOAL #5   Title report sleep is 25% better   Time 8   Period Weeks   Status New               Plan - 05/20/15 1438     Clinical Impression Statement Pt tolerated session well today. Her Right ilium is significantly higher than left. She will perfomr S/L over pillow on Left side at home for 10-15 minutes per day. She will also add a pillow between knees when sleeping on sides.      Patient will benefit from skilled therapeutic intervention in order to improve the following deficits and impairments:     Visit Diagnosis: Bilateral low back pain without sciatica     Problem List Patient Active Problem List   Diagnosis Date Noted  . Lumbar herniated disc 02/15/2015  . CKD (chronic kidney disease) stage 3, GFR 30-59 ml/min 06/22/2014  . Spondylolisthesis of lumbar region 08/07/2013  . Hyperlipidemia 06/13/2008  . ATRIAL FIBRILLATION 06/13/2008  . Essential hypertension 11/05/2006  . Coronary atherosclerosis 11/05/2006     Cheryl [name], PTA  05/20/2015, 2:41 PM  [facility] [address] [place], [place], [address] Phone: [phone]   Fax:  [phone]  Name: Cheryl [name] MRN: [id number] Date of Birth: February 08, 1934

## 2015-05-20 NOTE — Patient Instructions (Addendum)
Pt brought in her home tens unit. The battery needed to be re-inserted. She used it last night and it worked well. Pt will S/L on Left side to help release tension on Right side QL. She will also add pillow between knees when sleeping on sides.

## 2015-05-25 ENCOUNTER — Ambulatory Visit: Payer: Medicare Other | Admitting: Physical Therapy

## 2015-05-25 ENCOUNTER — Encounter: Payer: Self-pay | Admitting: Physical Therapy

## 2015-05-25 DIAGNOSIS — M545 Low back pain, unspecified: Secondary | ICD-10-CM

## 2015-05-25 NOTE — Therapy (Signed)
[facility] [address] [place], [place], [address] Phone: [phone]   Fax:  [phone]  Physical Therapy Treatment  Patient Details  Name: Cheryl QualeLina B [name] MRN: [id number] Date of Birth: 06/05/1934 Referring Provider: Naaman PlummerFred Newton  Encounter Date: 05/25/2015      PT End of Session - 05/25/15 1328    Visit Number 4   Date for PT Re-Evaluation 07/13/15   PT Start Time 1230   PT Stop Time 1333   PT Time Calculation (min) 63 min   Activity Tolerance Patient tolerated treatment well   Behavior During Therapy [facility] for tasks assessed/performed      Past Medical History  Diagnosis Date  . Hypertension   . Hyperlipidemia   . PAF (paroxysmal atrial fibrillation) (HCC)   . Obesity   . History of heartburn   . High risk medication use     Flecainide therapy  . Syncope July 2012  . Normal nuclear stress test July 2012  . LVH (left ventricular hypertrophy)     Per echo in July 2012  . Diastolic dysfunction     Grade I, per echo in 2012  . CAD (coronary artery disease)     Mild per remote cath in 2004; normal nuclear in 2012  . Complication of anesthesia     slow to wake up  . Dysrhythmia   . Pneumonia     20 yrs ago  . GERD (gastroesophageal reflux disease)     prevacid  . H/O hiatal hernia   . Headache(784.0)     sinus  . Arthritis   . Chronic kidney disease     stage3 kidney disease     Past Surgical History  Procedure Laterality Date  . Cardiac catheterization  07/28/2002    EF GREATER THAN 55%; MILD CAD  . Back surgery  1980, 2010    x2  . Tonsillectomy      age 20 yrs  . Appendectomy  1952  . Tubal ligation    . Dilation and curettage of uterus    . Colonoscopy    . Lumbar laminectomy/decompression microdiscectomy Right 02/15/2015    Procedure: Right Thoracic twelve-Lumbar one  Microdiskectomy;  Surgeon: Tressie StalkerJeffrey Jenkins, MD;  Location: [facility];  Service: Neurosurgery;  Laterality: Right;   Thoracic/Lumbar    There were no vitals filed for this visit.      Subjective Assessment - 05/25/15 1233    Subjective "The pain is in my back today. It feels like my legs are about ready to spasm, but they haven't yet"   Currently in Pain? Yes   Pain Score 5    Pain Location Back   Pain Orientation Lower   Pain Descriptors / Indicators Aching;Spasm                         [facility] Adult PT Treatment/Exercise - 05/25/15 0001    Lumbar Exercises: Stretches   Passive Hamstring Stretch 2 reps;30 seconds  Bilateral   Single Knee to Chest Stretch 3 reps;20 seconds  Bilateral   Double Knee to Chest Stretch 3 reps;20 seconds  Bilateral   Lumbar Exercises: Machines for Strengthening   Other Lumbar Machine Exercise Nu Step L3 6 min   Lumbar Exercises: Supine   Ab Set 20 reps;5 seconds  TrA with PPT   Bridge 10 reps  2 sets   Other Supine Lumbar Exercises isometric TrA with 3 marches, 5  times   Lumbar Exercises: Sidelying   Clam 15 reps   Modalities   Modalities Electrical Stimulation;Moist Heat   Moist Heat Therapy   Number Minutes Moist Heat 15 Minutes   Moist Heat Location Lumbar Spine   Electrical Stimulation   Electrical Stimulation Location Lumbar   Electrical Stimulation Action IFC   Electrical Stimulation Parameters supine   Electrical Stimulation Goals Pain   Manual Therapy   Manual Therapy Passive ROM   Passive ROM Hip IR                  PT Short Term Goals - 05/13/15 1415    PT SHORT TERM GOAL #1   Title independent with initial HEP   Time 1   Period Weeks   Status New           PT Long Term Goals - 05/13/15 1415    PT LONG TERM GOAL #1   Title understand proper posture and body mechanics   Time 8   Period Weeks   Status New   PT LONG TERM GOAL #2   Title increase lumbar ROM 25%   Time 8   Period Weeks   Status New   PT LONG TERM GOAL #3   Title decrease pain 25%   Time 8   Period Weeks   Status New   PT LONG TERM  GOAL #4   Title report taking 1/2 as much medicine, she is taking 1 1/2 pain pills a day currently at eval   Time 8   Period Weeks   Status New   PT LONG TERM GOAL #5   Title report sleep is 25% better   Time 8   Period Weeks   Status New               Plan - 05/25/15 1330    Clinical Impression Statement Pt tolerated ther ex and core stabilization exercises well, with increased pain into active hip flexion. She seems frustrated with persistence of pain and thinks it is nerve pain and unsure if anything will permanently relieve her pain.   PT Treatment/Interventions ADLs/Self Care Home Management;Electrical Stimulation;Moist Heat;Therapeutic exercise;Manual techniques;Functional mobility training;Neuromuscular re-education;Ultrasound;Patient/family education;Passive range of motion   PT Next Visit Plan Progress ther ex, with emphasis on core stabilization.      Patient will benefit from skilled therapeutic intervention in order to improve the following deficits and impairments:     Visit Diagnosis: Bilateral low back pain without sciatica     Problem List Patient Active Problem List   Diagnosis Date Noted  . Lumbar herniated disc 02/15/2015  . CKD (chronic kidney disease) stage 3, GFR 30-59 ml/min 06/22/2014  . Spondylolisthesis of lumbar region 08/07/2013  . Hyperlipidemia 06/13/2008  . ATRIAL FIBRILLATION 06/13/2008  . Essential hypertension 11/05/2006  . Coronary atherosclerosis 11/05/2006    Ronette Deter SPTA 05/25/2015, 1:35 PM  [facility] [address] [place], [place], [address] Phone: [phone]   Fax:  [phone]  Name: Cheryl [name] MRN: [id number] Date of Birth: 12/09/1934

## 2015-05-27 ENCOUNTER — Encounter: Payer: Self-pay | Admitting: Physical Therapy

## 2015-05-27 ENCOUNTER — Ambulatory Visit: Payer: Medicare Other | Admitting: Physical Therapy

## 2015-05-27 DIAGNOSIS — M545 Low back pain, unspecified: Secondary | ICD-10-CM

## 2015-05-27 NOTE — Therapy (Signed)
[facility] [address] [place], [place], [address] Phone: [phone]   Fax:  [phone]  Physical Therapy Treatment  Patient Details  Name: Cheryl [name] MRN: [id number] Date of Birth: 07/18/1934 Referring Provider: Naaman Plummer  Encounter Date: 05/27/2015      PT End of Session - 05/27/15 1527    Visit Number 5   Date for PT Re-Evaluation 07/13/15   PT Start Time 1445   PT Stop Time 1540   PT Time Calculation (min) 55 min   Activity Tolerance Patient tolerated treatment well   Behavior During Therapy [facility] for tasks assessed/performed      Past Medical History  Diagnosis Date  . Hypertension   . Hyperlipidemia   . PAF (paroxysmal atrial fibrillation) (HCC)   . Obesity   . History of heartburn   . High risk medication use     Flecainide therapy  . Syncope July 2012  . Normal nuclear stress test July 2012  . LVH (left ventricular hypertrophy)     Per echo in July 2012  . Diastolic dysfunction     Grade I, per echo in 2012  . CAD (coronary artery disease)     Mild per remote cath in 2004; normal nuclear in 2012  . Complication of anesthesia     slow to wake up  . Dysrhythmia   . Pneumonia     20 yrs ago  . GERD (gastroesophageal reflux disease)     prevacid  . H/O hiatal hernia   . Headache(784.0)     sinus  . Arthritis   . Chronic kidney disease     stage3 kidney disease     Past Surgical History  Procedure Laterality Date  . Cardiac catheterization  07/28/2002    EF GREATER THAN 55%; MILD CAD  . Back surgery  1980, 2010    x2  . Tonsillectomy      age 35 yrs  . Appendectomy  1952  . Tubal ligation    . Dilation and curettage of uterus    . Colonoscopy    . Lumbar laminectomy/decompression microdiscectomy Right 02/15/2015    Procedure: Right Thoracic twelve-Lumbar one  Microdiskectomy;  Surgeon: Tressie Stalker, MD;  Location: [facility];  Service: Neurosurgery;  Laterality: Right;   Thoracic/Lumbar    There were no vitals filed for this visit.      Subjective Assessment - 05/27/15 1444    Subjective "I feel alright today, a little less pain"   Currently in Pain? Yes   Pain Score 3    Pain Location Back   Pain Orientation Lower                         [facility] Adult PT Treatment/Exercise - 05/27/15 0001    Lumbar Exercises: Stretches   Passive Hamstring Stretch 2 reps;30 seconds  Bilateral   Single Knee to Chest Stretch 3 reps;20 seconds  Bilateral   Double Knee to Chest Stretch 3 reps;20 seconds  Bilateral   Piriformis Stretch 3 reps;20 seconds  Bilateral   Piriformis Stretch Limitations IR stretch 3 x20 sec Bilateral   Lumbar Exercises: Machines for Strengthening   Leg Press 20# 2x15   Other Lumbar Machine Exercise Nu Step L3 6 min   Lumbar Exercises: Supine   Ab Set 20 reps;5 seconds  TrA PPT   Clam 20 reps  with yellow tband   Bridge 10 reps  2 sets with ball squeeze   Other Supine Lumbar Exercises isometric TrA with 3 marches, 10 times   Modalities   Modalities Electrical Stimulation;Moist Heat   Moist Heat Therapy   Number Minutes Moist Heat 15 Minutes   Moist Heat Location Lumbar Spine   Electrical Stimulation   Electrical Stimulation Location Lumbar   Electrical Stimulation Action IFC   Electrical Stimulation Parameters supine   Electrical Stimulation Goals Pain                  PT Short Term Goals - 05/13/15 1415    PT SHORT TERM GOAL #1   Title independent with initial HEP   Time 1   Period Weeks   Status New           PT Long Term Goals - 05/13/15 1415    PT LONG TERM GOAL #1   Title understand proper posture and body mechanics   Time 8   Period Weeks   Status New   PT LONG TERM GOAL #2   Title increase lumbar ROM 25%   Time 8   Period Weeks   Status New   PT LONG TERM GOAL #3   Title decrease pain 25%   Time 8   Period Weeks   Status New   PT LONG TERM GOAL #4   Title report taking  1/2 as much medicine, she is taking 1 1/2 pain pills a day currently at eval   Time 8   Period Weeks   Status New   PT LONG TERM GOAL #5   Title report sleep is 25% better   Time 8   Period Weeks   Status New               Plan - 05/27/15 1528    Clinical Impression Statement Tolerated all ther ex and core stabilization well, along with leg press intervention. Less pain today with active hip flexion.    PT Treatment/Interventions ADLs/Self Care Home Management;Electrical Stimulation;Moist Heat;Therapeutic exercise;Manual techniques;Functional mobility training;Neuromuscular re-education;Ultrasound;Patient/family education;Passive range of motion   PT Next Visit Plan Progress ther ex and core stabilization. Continue estim and MHP as needed for pain.      Patient will benefit from skilled therapeutic intervention in order to improve the following deficits and impairments:     Visit Diagnosis: Bilateral low back pain without sciatica     Problem List Patient Active Problem List   Diagnosis Date Noted  . Lumbar herniated disc 02/15/2015  . CKD (chronic kidney disease) stage 3, GFR 30-59 ml/min 06/22/2014  . Spondylolisthesis of lumbar region 08/07/2013  . Hyperlipidemia 06/13/2008  . ATRIAL FIBRILLATION 06/13/2008  . Essential hypertension 11/05/2006  . Coronary atherosclerosis 11/05/2006    Ronette Deteryan Tura Roller SPTA 05/27/2015, 4:05 PM  [facility] [address] [place], [place], [address] Phone: [phone]   Fax:  [phone]  Name: Cheryl QualeLina B [name] MRN: [id number] Date of Birth: 1935-01-25

## 2015-06-01 ENCOUNTER — Ambulatory Visit: Payer: Medicare Other | Admitting: Physical Therapy

## 2015-06-01 ENCOUNTER — Encounter: Payer: Self-pay | Admitting: Physical Therapy

## 2015-06-01 DIAGNOSIS — M545 Low back pain, unspecified: Secondary | ICD-10-CM

## 2015-06-01 NOTE — Therapy (Signed)
[facility] [address] [place], [place], [address] Phone: [phone]   Fax:  [phone]  Physical Therapy Treatment  Patient Details  Name: Cheryl [name] MRN: [id number] Date of Birth: [birth year] Referring Provider: Naaman PlummerFred Newton  Encounter Date: 06/01/2015      PT End of Session - 06/01/15 1230    Visit Number 6   Date for PT Re-Evaluation 07/13/15   PT Start Time 1145   PT Stop Time 1240   PT Time Calculation (min) 55 min   Activity Tolerance Patient tolerated treatment well   Behavior During Therapy [facility] for tasks assessed/performed      Past Medical History  Diagnosis Date  . Hypertension   . Hyperlipidemia   . PAF (paroxysmal atrial fibrillation) (HCC)   . Obesity   . History of heartburn   . High risk medication use     Flecainide therapy  . Syncope July 2012  . Normal nuclear stress test July 2012  . LVH (left ventricular hypertrophy)     Per echo in July 2012  . Diastolic dysfunction     Grade I, per echo in 2012  . CAD (coronary artery disease)     Mild per remote cath in 2004; normal nuclear in 2012  . Complication of anesthesia     slow to wake up  . Dysrhythmia   . Pneumonia     20 yrs ago  . GERD (gastroesophageal reflux disease)     prevacid  . H/O hiatal hernia   . Headache(784.0)     sinus  . Arthritis   . Chronic kidney disease     stage3 kidney disease     Past Surgical History  Procedure Laterality Date  . Cardiac catheterization  07/28/2002    EF GREATER THAN 55%; MILD CAD  . Back surgery  1980, 2010    x2  . Tonsillectomy      age 36 yrs  . Appendectomy  1952  . Tubal ligation    . Dilation and curettage of uterus    . Colonoscopy    . Lumbar laminectomy/decompression microdiscectomy Right 02/15/2015    Procedure: Right Thoracic twelve-Lumbar one  Microdiskectomy;  Surgeon: Tressie StalkerJeffrey Jenkins, MD;  Location: [facility];  Service: Neurosurgery;  Laterality: Right;   Thoracic/Lumbar    There were no vitals filed for this visit.      Subjective Assessment - 06/01/15 1149    Subjective Pt reports feeling more pain today with the weather.    Currently in Pain? Yes   Pain Score 7    Pain Location Back   Pain Orientation Lower                         [facility] Adult PT Treatment/Exercise - 06/01/15 0001    Lumbar Exercises: Stretches   Passive Hamstring Stretch 2 reps;30 seconds  Bilateral   Single Knee to Chest Stretch 2 reps;30 seconds  Bilateral   Piriformis Stretch 3 reps;20 seconds   Piriformis Stretch Limitations IR stretch 3 x20 sec left   Lumbar Exercises: Machines for Strengthening   Other Lumbar Machine Exercise Nu Step L4 6 min   Lumbar Exercises: Seated   Sit to Stand 10 reps   Sit to Stand Limitations one foot behind the other first, then working to bring feet together   Lumbar Exercises: Supine   Ab Set 20 reps;5 seconds  with PPT   Clam  15 reps  2 sets with red tband   Bridge 15 reps  2 sets with ball squeeze   Other Supine Lumbar Exercises sit fit rolls, lateral, A/P x10 each   Other Supine Lumbar Exercises isometric TrA with 5 marches, 10 times   Modalities   Modalities Electrical Stimulation;Moist Heat   Moist Heat Therapy   Number Minutes Moist Heat 15 Minutes   Moist Heat Location Lumbar Spine   Electrical Stimulation   Electrical Stimulation Location Lumbar   Electrical Stimulation Action IFC   Electrical Stimulation Parameters supine   Electrical Stimulation Goals Pain                  PT Short Term Goals - 05/13/15 1415    PT SHORT TERM GOAL #1   Title independent with initial HEP   Time 1   Period Weeks   Status New           PT Long Term Goals - 05/13/15 1415    PT LONG TERM GOAL #1   Title understand proper posture and body mechanics   Time 8   Period Weeks   Status New   PT LONG TERM GOAL #2   Title increase lumbar ROM 25%   Time 8   Period Weeks   Status New    PT LONG TERM GOAL #3   Title decrease pain 25%   Time 8   Period Weeks   Status New   PT LONG TERM GOAL #4   Title report taking 1/2 as much medicine, she is taking 1 1/2 pain pills a day currently at eval   Time 8   Period Weeks   Status New   PT LONG TERM GOAL #5   Title report sleep is 25% better   Time 8   Period Weeks   Status New               Plan - 06/01/15 1231    Clinical Impression Statement Patient tolerated all interventions well, with only increase in pain into active hip flexion. Able to perform sit to stands with feet together after working with one foot behind the other and gradually bringing together. Continues to have tight HS and piriformis.    PT Treatment/Interventions ADLs/Self Care Home Management;Electrical Stimulation;Moist Heat;Therapeutic exercise;Manual techniques;Functional mobility training;Neuromuscular re-education;Ultrasound;Patient/family education;Passive range of motion   PT Next Visit Plan Progress core stabilization exercises. Stretch HS and piriformis, possibly MT to piriformis.      Patient will benefit from skilled therapeutic intervention in order to improve the following deficits and impairments:     Visit Diagnosis: Bilateral low back pain without sciatica     Problem List Patient Active Problem List   Diagnosis Date Noted  . Lumbar herniated disc 02/15/2015  . CKD (chronic kidney disease) stage 3, GFR 30-59 ml/min 06/22/2014  . Spondylolisthesis of lumbar region 08/07/2013  . Hyperlipidemia 06/13/2008  . ATRIAL FIBRILLATION 06/13/2008  . Essential hypertension 11/05/2006  . Coronary atherosclerosis 11/05/2006    Ronette Deter SPTA 06/01/2015, 12:35 PM  [facility] [address] [place], [place], [address] Phone: [phone]   Fax:  [phone]  Name: Cheryl [name] MRN: [id number] Date of Birth: 05/15/1934

## 2015-06-03 ENCOUNTER — Ambulatory Visit: Payer: Medicare Other | Admitting: Physical Therapy

## 2015-06-09 ENCOUNTER — Ambulatory Visit: Payer: Medicare Other | Admitting: Physical Therapy

## 2015-06-14 ENCOUNTER — Encounter: Payer: Self-pay | Admitting: Physical Therapy

## 2015-06-14 ENCOUNTER — Ambulatory Visit: Payer: Medicare Other | Attending: Physical Medicine and Rehabilitation | Admitting: Physical Therapy

## 2015-06-14 DIAGNOSIS — M545 Low back pain, unspecified: Secondary | ICD-10-CM

## 2015-06-14 NOTE — Therapy (Signed)
[facility] [address] [place], [place], [address] Phone: [phone]   Fax:  [phone]  Physical Therapy Treatment  Patient Details  Name: Cheryl [name] MRN: [id number] Date of Birth: 06-05-1934 Referring Provider: Naaman PlummerFred Newton  Encounter Date: 06/14/2015      PT End of Session - 06/14/15 1156    Date for PT Re-Evaluation 07/13/15      Past Medical History  Diagnosis Date  . Hypertension   . Hyperlipidemia   . PAF (paroxysmal atrial fibrillation) (HCC)   . Obesity   . History of heartburn   . High risk medication use     Flecainide therapy  . Syncope July 2012  . Normal nuclear stress test July 2012  . LVH (left ventricular hypertrophy)     Per echo in July 2012  . Diastolic dysfunction     Grade I, per echo in 2012  . CAD (coronary artery disease)     Mild per remote cath in 2004; normal nuclear in 2012  . Complication of anesthesia     slow to wake up  . Dysrhythmia   . Pneumonia     20 yrs ago  . GERD (gastroesophageal reflux disease)     prevacid  . H/O hiatal hernia   . Headache(784.0)     sinus  . Arthritis   . Chronic kidney disease     stage3 kidney disease     Past Surgical History  Procedure Laterality Date  . Cardiac catheterization  07/28/2002    EF GREATER THAN 55%; MILD CAD  . Back surgery  1980, 2010    x2  . Tonsillectomy      age 34 yrs  . Appendectomy  1952  . Tubal ligation    . Dilation and curettage of uterus    . Colonoscopy    . Lumbar laminectomy/decompression microdiscectomy Right 02/15/2015    Procedure: Right Thoracic twelve-Lumbar one  Microdiskectomy;  Surgeon: Tressie StalkerJeffrey Jenkins, MD;  Location: [facility];  Service: Neurosurgery;  Laterality: Right;  Thoracic/Lumbar    There were no vitals filed for this visit.      Subjective Assessment - 06/14/15 1103    Subjective "Not doing too good, but I don't feel like Im getting better, Physical therapy has helped  with my stiffness but not with pain"   Currently in Pain? Yes   Pain Score 8    Pain Location Back   Pain Orientation Left;Lower   Pain Descriptors / Indicators Sharp                         [facility] Adult PT Treatment/Exercise - 06/14/15 0001    Lumbar Exercises: Stretches   Passive Hamstring Stretch 4 reps;10 seconds   Single Knee to Chest Stretch 3 reps;10 seconds   Double Knee to Chest Stretch 2 reps;10 seconds   Lower Trunk Rotation 4 reps;20 seconds   Piriformis Stretch 3 reps;20 seconds   Lumbar Exercises: Machines for Strengthening   Leg Press 20# 3x10   Other Lumbar Machine Exercise Nu Step L4 6 min   Other Lumbar Machine Exercise Seated Rows & lats #25 2x10    Lumbar Exercises: Seated   Sit to Stand 10 reps  x2 with UE use    Lumbar Exercises: Supine   Bridge 15 reps   Straight Leg Raise 5 reps;2 seconds   Modalities   Modalities Electrical Stimulation;Moist Heat   Moist  Heat Therapy   Number Minutes Moist Heat 15 Minutes   Moist Heat Location Lumbar Spine   Electrical Stimulation   Electrical Stimulation Location Lumbar   Electrical Stimulation Action IFC   Electrical Stimulation Parameters supine   Electrical Stimulation Goals Pain                  PT Short Term Goals - 05/13/15 1415    PT SHORT TERM GOAL #1   Title independent with initial HEP   Time 1   Period Weeks   Status New           PT Long Term Goals - 05/13/15 1415    PT LONG TERM GOAL #1   Title understand proper posture and body mechanics   Time 8   Period Weeks   Status New   PT LONG TERM GOAL #2   Title increase lumbar ROM 25%   Time 8   Period Weeks   Status New   PT LONG TERM GOAL #3   Title decrease pain 25%   Time 8   Period Weeks   Status New   PT LONG TERM GOAL #4   Title report taking 1/2 as much medicine, she is taking 1 1/2 pain pills a day currently at eval   Time 8   Period Weeks   Status New   PT LONG TERM GOAL #5   Title report sleep  is 25% better   Time 8   Period Weeks   Status New               Plan - 06/14/15 1147    Clinical Impression Statement Again able to perform all exercises well. Does report some pain with seated rows in L lower back. Reports that she has an appointment with the pain MD next week   Rehab Potential Good   PT Frequency 2x / week   PT Duration 8 weeks   PT Treatment/Interventions ADLs/Self Care Home Management;Electrical Stimulation;Moist Heat;Therapeutic exercise;Manual techniques;Functional mobility training;Neuromuscular re-education;Ultrasound;Patient/family education;Passive range of motion   PT Next Visit Plan Progress core stabilization exercises. Stretch HS and piriformis      Patient will benefit from skilled therapeutic intervention in order to improve the following deficits and impairments:  Decreased activity tolerance, Decreased mobility, Decreased range of motion, Decreased safety awareness, Decreased strength, Difficulty walking, Increased muscle spasms, Impaired flexibility, Postural dysfunction, Improper body mechanics, Pain  Visit Diagnosis: Bilateral low back pain without sciatica     Problem List Patient Active Problem List   Diagnosis Date Noted  . Lumbar herniated disc 02/15/2015  . CKD (chronic kidney disease) stage 3, GFR 30-59 ml/min 06/22/2014  . Spondylolisthesis of lumbar region 08/07/2013  . Hyperlipidemia 06/13/2008  . ATRIAL FIBRILLATION 06/13/2008  . Essential hypertension 11/05/2006  . Coronary atherosclerosis 11/05/2006    Grayce Sessions, PTA  06/14/2015, 11:56 AM  [facility] [address] [place], [place], [address] Phone: [phone]   Fax:  [phone]  Name: Cheryl [name] MRN: [id number] Date of Birth: [birth year]

## 2015-06-17 ENCOUNTER — Encounter: Payer: Self-pay | Admitting: Physical Therapy

## 2015-06-17 ENCOUNTER — Ambulatory Visit: Payer: Medicare Other | Admitting: Physical Therapy

## 2015-06-17 DIAGNOSIS — M545 Low back pain, unspecified: Secondary | ICD-10-CM

## 2015-06-17 NOTE — Therapy (Signed)
[facility] [address] [place], [place], [address] Phone: [phone]   Fax:  [phone]  Physical Therapy Treatment  Patient Details  Name: Cheryl [name] MRN: [id number] Date of Birth: 11-Mar-1934 Referring Provider: Naaman Plummer  Encounter Date: 06/17/2015      PT End of Session - 06/17/15 1144    Visit Number 8   Date for PT Re-Evaluation 07/13/15   PT Start Time 1100   PT Stop Time 1158   PT Time Calculation (min) 58 min   Activity Tolerance Patient tolerated treatment well   Behavior During Therapy [facility] for tasks assessed/performed      Past Medical History  Diagnosis Date  . Hypertension   . Hyperlipidemia   . PAF (paroxysmal atrial fibrillation) (HCC)   . Obesity   . History of heartburn   . High risk medication use     Flecainide therapy  . Syncope July 2012  . Normal nuclear stress test July 2012  . LVH (left ventricular hypertrophy)     Per echo in July 2012  . Diastolic dysfunction     Grade I, per echo in 2012  . CAD (coronary artery disease)     Mild per remote cath in 2004; normal nuclear in 2012  . Complication of anesthesia     slow to wake up  . Dysrhythmia   . Pneumonia     20 yrs ago  . GERD (gastroesophageal reflux disease)     prevacid  . H/O hiatal hernia   . Headache(784.0)     sinus  . Arthritis   . Chronic kidney disease     stage3 kidney disease     Past Surgical History  Procedure Laterality Date  . Cardiac catheterization  07/28/2002    EF GREATER THAN 55%; MILD CAD  . Back surgery  1980, 2010    x2  . Tonsillectomy      age 24 yrs  . Appendectomy  1952  . Tubal ligation    . Dilation and curettage of uterus    . Colonoscopy    . Lumbar laminectomy/decompression microdiscectomy Right 02/15/2015    Procedure: Right Thoracic twelve-Lumbar one  Microdiskectomy;  Surgeon: Tressie Stalker, MD;  Location: [facility];  Service: Neurosurgery;  Laterality: Right;   Thoracic/Lumbar    There were no vitals filed for this visit.      Subjective Assessment - 06/17/15 1101    Subjective "Im doing a lot better than the other day" Pt reports that yesterday she had a bad day but she slept well last nigh   Currently in Pain? Yes   Pain Score 5    Pain Location Back   Pain Orientation Left  but it kinda shifts back and forth                         [facility] Adult PT Treatment/Exercise - 06/17/15 0001    Lumbar Exercises: Stretches   Passive Hamstring Stretch 4 reps;10 seconds   Piriformis Stretch 3 reps;20 seconds   Lumbar Exercises: Machines for Strengthening   Cybex Knee Extension #5 3x10   Cybex Knee Flexion #20 3x10   Other Lumbar Machine Exercise Nu Step L4 6 min   Other Lumbar Machine Exercise Seated Rows & lats #25 3x10    Lumbar Exercises: Standing   Other Standing Lumbar Exercises Standing  Bilat UE flexion with yellow ball 2x10   Lumbar Exercises:  Seated   Sit to Stand 10 reps  x2, UE pushes form knees   Lumbar Exercises: Supine   Bridge 15 reps   Other Supine Lumbar Exercises Seated OHP with yellow ball 2x10   Modalities   Modalities Electrical Stimulation;Moist Heat   Moist Heat Therapy   Number Minutes Moist Heat 15 Minutes   Moist Heat Location Lumbar Spine   Electrical Stimulation   Electrical Stimulation Location Lumbar   Electrical Stimulation Action IFC   Electrical Stimulation Parameters supine   Electrical Stimulation Goals Pain                  PT Short Term Goals - 05/13/15 1415    PT SHORT TERM GOAL #1   Title independent with initial HEP   Time 1   Period Weeks   Status New           PT Long Term Goals - 05/13/15 1415    PT LONG TERM GOAL #1   Title understand proper posture and body mechanics   Time 8   Period Weeks   Status New   PT LONG TERM GOAL #2   Title increase lumbar ROM 25%   Time 8   Period Weeks   Status New   PT LONG TERM GOAL #3   Title decrease pain 25%    Time 8   Period Weeks   Status New   PT LONG TERM GOAL #4   Title report taking 1/2 as much medicine, she is taking 1 1/2 pain pills a day currently at eval   Time 8   Period Weeks   Status New   PT LONG TERM GOAL #5   Title report sleep is 25% better   Time 8   Period Weeks   Status New               Plan - 06/17/15 1144    Clinical Impression Statement Pt reports that she does feel better compared to last treatment. Able to completed all exercises, but does fatigue with bilat UE flexion with ball. No reports of increase pain. Hamstring on LLE much tighter than R    Rehab Potential Good   PT Frequency 2x / week   PT Duration 8 weeks   PT Treatment/Interventions ADLs/Self Care Home Management;Electrical Stimulation;Moist Heat;Therapeutic exercise;Manual techniques;Functional mobility training;Neuromuscular re-education;Ultrasound;Patient/family education;Passive range of motion   PT Next Visit Plan asscess how pt is feeling, Pt will have nerver burt mondayy.      Patient will benefit from skilled therapeutic intervention in order to improve the following deficits and impairments:  Decreased activity tolerance, Decreased mobility, Decreased range of motion, Decreased safety awareness, Decreased strength, Difficulty walking, Increased muscle spasms, Impaired flexibility, Postural dysfunction, Improper body mechanics, Pain  Visit Diagnosis: Bilateral low back pain without sciatica     Problem List Patient Active Problem List   Diagnosis Date Noted  . Lumbar herniated disc 02/15/2015  . CKD (chronic kidney disease) stage 3, GFR 30-59 ml/min 06/22/2014  . Spondylolisthesis of lumbar region 08/07/2013  . Hyperlipidemia 06/13/2008  . ATRIAL FIBRILLATION 06/13/2008  . Essential hypertension 11/05/2006  . Coronary atherosclerosis 11/05/2006    Grayce Sessionsonald G Caroly Purewal, PTA  06/17/2015, 11:47 AM  [facility] [address] [place], [place], [address] Phone: [phone]   Fax:  [phone]  Name: Cheryl [name] MRN: [id number] Date of Birth: Mar 20, 1934

## 2015-06-24 ENCOUNTER — Encounter: Payer: Self-pay | Admitting: Physical Therapy

## 2015-06-24 ENCOUNTER — Ambulatory Visit: Payer: Medicare Other | Admitting: Physical Therapy

## 2015-06-24 DIAGNOSIS — M545 Low back pain, unspecified: Secondary | ICD-10-CM

## 2015-06-24 NOTE — Therapy (Signed)
[facility] [address] [place], [place], [address] Phone: [phone]   Fax:  [phone]  Physical Therapy Treatment  Patient Details  Name: Cheryl QualeLina B [name] MRN: [id number] Date of Birth: [birth year] Referring Provider: Naaman PlummerFred Newton  Encounter Date: 06/24/2015      PT End of Session - 06/24/15 1138    Visit Number 9   Date for PT Re-Evaluation 07/13/15   PT Start Time 1102   PT Stop Time 1200   PT Time Calculation (min) 58 min      Past Medical History  Diagnosis Date  . Hypertension   . Hyperlipidemia   . PAF (paroxysmal atrial fibrillation) (HCC)   . Obesity   . History of heartburn   . High risk medication use     Flecainide therapy  . Syncope July 2012  . Normal nuclear stress test July 2012  . LVH (left ventricular hypertrophy)     Per echo in July 2012  . Diastolic dysfunction     Grade I, per echo in 2012  . CAD (coronary artery disease)     Mild per remote cath in 2004; normal nuclear in 2012  . Complication of anesthesia     slow to wake up  . Dysrhythmia   . Pneumonia     20 yrs ago  . GERD (gastroesophageal reflux disease)     prevacid  . H/O hiatal hernia   . Headache(784.0)     sinus  . Arthritis   . Chronic kidney disease     stage3 kidney disease     Past Surgical History  Procedure Laterality Date  . Cardiac catheterization  07/28/2002    EF GREATER THAN 55%; MILD CAD  . Back surgery  1980, 2010    x2  . Tonsillectomy      age 28 yrs  . Appendectomy  1952  . Tubal ligation    . Dilation and curettage of uterus    . Colonoscopy    . Lumbar laminectomy/decompression microdiscectomy Right 02/15/2015    Procedure: Right Thoracic twelve-Lumbar one  Microdiskectomy;  Surgeon: Tressie StalkerJeffrey Jenkins, MD;  Location: [facility];  Service: Neurosurgery;  Laterality: Right;  Thoracic/Lumbar    There were no vitals filed for this visit.      Subjective Assessment - 06/24/15 1103    Subjective burning done Monday,better and better each day. standing up straight. haven't felt this good in monthes   Currently in Pain? Yes   Pain Score 3    Pain Location Back            [facility] PT Assessment - 06/24/15 0001    AROM   Overall AROM Comments lumbar ROM SB WFLs, ext decreased 50%, flex decreased 25%                     [facility] Adult PT Treatment/Exercise - 06/24/15 0001    Lumbar Exercises: Aerobic   Stationary Bike Nustep L 4 6 min   UBE (Upper Arm Bike) 322fwd/2 back L 2   Lumbar Exercises: Machines for Strengthening   Cybex Lumbar Extension blue tband 2 sets 10   Cybex Knee Extension #5 3x10   Cybex Knee Flexion #20 3x10   Other Lumbar Machine Exercise Seated Rows & lats #20 2 sets 15  25# was too heavy   Lumbar Exercises: Standing   Other Standing Lumbar Exercises lat flexion 3# 10 times each   Other Standing Lumbar  Exercises 3# obl crossover 10 each way   Lumbar Exercises: Seated   Sit to Stand 5 reps  2 sets with wt ball   Sit to Stand Limitations abd pull blue tband 2 set s10   Moist Heat Therapy   Number Minutes Moist Heat 15 Minutes   Moist Heat Location Lumbar Spine   Electrical Stimulation   Electrical Stimulation Location Lumbar   Electrical Stimulation Action IFC   Electrical Stimulation Goals Pain                  PT Short Term Goals - 05/13/15 1415    PT SHORT TERM GOAL #1   Title independent with initial HEP   Time 1   Period Weeks   Status New           PT Long Term Goals - 06/24/15 1128    PT LONG TERM GOAL #1   Status On-going   PT LONG TERM GOAL #2   Title increase lumbar ROM 25%   Status On-going   PT LONG TERM GOAL #3   Title decrease pain 25%   Baseline yes since nerve burning   Status Achieved   PT LONG TERM GOAL #4   Title report taking 1/2 as much medicine, she is taking 1 1/2 pain pills a day currently at eval   Status On-going   PT LONG TERM GOAL #5   Title report sleep is 25% better    Status On-going               Plan - 06/24/15 1138    Clinical Impression Statement improved ROM,less pain and increased tolerance to ther ex since nerve burning. Lateral shift resent and core weakness noted   PT Next Visit Plan Continue with core strengthening      Patient will benefit from skilled therapeutic intervention in order to improve the following deficits and impairments:  Decreased activity tolerance, Decreased mobility, Decreased range of motion, Decreased safety awareness, Decreased strength, Difficulty walking, Increased muscle spasms, Impaired flexibility, Postural dysfunction, Improper body mechanics, Pain  Visit Diagnosis: Bilateral low back pain without sciatica     Problem List Patient Active Problem List   Diagnosis Date Noted  . Lumbar herniated disc 02/15/2015  . CKD (chronic kidney disease) stage 3, GFR 30-59 ml/min 06/22/2014  . Spondylolisthesis of lumbar region 08/07/2013  . Hyperlipidemia 06/13/2008  . ATRIAL FIBRILLATION 06/13/2008  . Essential hypertension 11/05/2006  . Coronary atherosclerosis 11/05/2006    Bernadette Armijo,ANGIE PTA 06/24/2015, 11:39 AM  [facility] [address] [place], [place], [address] Phone: [phone]   Fax:  [phone]  Name: Cheryl [name] MRN: [id number] Date of Birth: [birth year]

## 2015-07-08 ENCOUNTER — Ambulatory Visit: Payer: Medicare Other | Attending: Physical Medicine and Rehabilitation | Admitting: Physical Therapy

## 2015-07-08 ENCOUNTER — Encounter: Payer: Self-pay | Admitting: Physical Therapy

## 2015-07-08 DIAGNOSIS — M545 Low back pain, unspecified: Secondary | ICD-10-CM

## 2015-07-08 NOTE — Therapy (Signed)
[facility] [address] [place], [place], [address] Phone: [phone]   Fax:  [phone]  Physical Therapy Treatment  Patient Details  Name: Cheryl [name] MRN: [id number] Date of Birth: 02-09-1934 Referring Provider: Naaman PlummerFred Newton  Encounter Date: 07/08/2015      PT End of Session - 07/08/15 1540    Visit Number 10   Date for PT Re-Evaluation 07/13/15   PT Start Time 1530   PT Stop Time 1630   PT Time Calculation (min) 60 min      Past Medical History  Diagnosis Date  . Hypertension   . Hyperlipidemia   . PAF (paroxysmal atrial fibrillation) (HCC)   . Obesity   . History of heartburn   . High risk medication use     Flecainide therapy  . Syncope July 2012  . Normal nuclear stress test July 2012  . LVH (left ventricular hypertrophy)     Per echo in July 2012  . Diastolic dysfunction     Grade I, per echo in 2012  . CAD (coronary artery disease)     Mild per remote cath in 2004; normal nuclear in 2012  . Complication of anesthesia     slow to wake up  . Dysrhythmia   . Pneumonia     20 yrs ago  . GERD (gastroesophageal reflux disease)     prevacid  . H/O hiatal hernia   . Headache(784.0)     sinus  . Arthritis   . Chronic kidney disease     stage3 kidney disease     Past Surgical History  Procedure Laterality Date  . Cardiac catheterization  07/28/2002    EF GREATER THAN 55%; MILD CAD  . Back surgery  1980, 2010    x2  . Tonsillectomy      age 81 yrs  . Appendectomy  1952  . Tubal ligation    . Dilation and curettage of uterus    . Colonoscopy    . Lumbar laminectomy/decompression microdiscectomy Right 02/15/2015    Procedure: Right Thoracic twelve-Lumbar one  Microdiskectomy;  Surgeon: Tressie StalkerJeffrey Jenkins, MD;  Location: [facility];  Service: Neurosurgery;  Laterality: Right;  Thoracic/Lumbar    There were no vitals filed for this visit.      Subjective Assessment - 07/08/15 1532    Subjective rt side acting up now, getting that side burned on Monday   Pain Score 4    Pain Location Back                         [facility] Adult PT Treatment/Exercise - 07/08/15 0001    Lumbar Exercises: Aerobic   Stationary Bike Nustep L 4 6 min   Lumbar Exercises: Machines for Strengthening   Cybex Knee Extension #5 3x10   Cybex Knee Flexion #20 3x10   Other Lumbar Machine Exercise seated row 20# 2 sets 15  lats 20# 2 sets 15   Lumbar Exercises: Standing   Other Standing Lumbar Exercises red tband hip 3 way 15 times each   Other Standing Lumbar Exercises red tband scap 3 way 15 reps each  wt ball obl and OH 15 times each   Moist Heat Therapy   Number Minutes Moist Heat 15 Minutes   Moist Heat Location Lumbar Spine   Electrical Stimulation   Electrical Stimulation Location Lumbar   Electrical Stimulation Action IFC   Electrical Stimulation Goals Pain  PT Short Term Goals - 07/08/15 1545    PT SHORT TERM GOAL #1   Title independent with initial HEP   Status Achieved           PT Long Term Goals - 07/08/15 1545    PT LONG TERM GOAL #1   Title understand proper posture and body mechanics   Status Achieved   PT LONG TERM GOAL #2   Title increase lumbar ROM 25%   Status On-going   PT LONG TERM GOAL #3   Title decrease pain 25%   Status Achieved   PT LONG TERM GOAL #4   Title report taking 1/2 as much medicine, she is taking 1 1/2 pain pills a day currently at eval   Status Achieved   PT LONG TERM GOAL #5   Title report sleep is 25% better   Status Achieved               Plan - 07/08/15 1556    Clinical Impression Statement left side of back doing weel from burning so hopefully Rt side will be next week., progressing with all goals and strength increasing.      Patient will benefit from skilled therapeutic intervention in order to improve the following deficits and impairments:  Decreased activity tolerance,  Decreased mobility, Decreased range of motion, Decreased safety awareness, Decreased strength, Difficulty walking, Increased muscle spasms, Impaired flexibility, Postural dysfunction, Improper body mechanics, Pain  Visit Diagnosis: Bilateral low back pain without sciatica     Problem List Patient Active Problem List   Diagnosis Date Noted  . Lumbar herniated disc 02/15/2015  . CKD (chronic kidney disease) stage 3, GFR 30-59 ml/min 06/22/2014  . Spondylolisthesis of lumbar region 08/07/2013  . Hyperlipidemia 06/13/2008  . ATRIAL FIBRILLATION 06/13/2008  . Essential hypertension 11/05/2006  . Coronary atherosclerosis 11/05/2006    Precious Segall,ANGIE PTA 07/08/2015, 3:59 PM  [facility] [address] [place], [place], [address] Phone: [phone]   Fax:  [phone]  Name: Cheryl [name] MRN: [id number] Date of Birth: 04/26/1934

## 2015-07-15 ENCOUNTER — Ambulatory Visit: Payer: Medicare Other | Admitting: Physical Therapy

## 2015-07-15 ENCOUNTER — Encounter: Payer: Self-pay | Admitting: Physical Therapy

## 2015-07-15 DIAGNOSIS — M545 Low back pain, unspecified: Secondary | ICD-10-CM

## 2015-07-15 NOTE — Therapy (Signed)
[facility] [address] [place], [place], [address] Phone: [phone]   Fax:  [phone]  Physical Therapy Treatment  Patient Details  Name: Cheryl [name] MRN: [id number] Date of Birth: Jun 21, 1934 Referring Provider: Naaman PlummerFred Newton  Encounter Date: 07/15/2015      PT End of Session - 07/15/15 1629    Visit Number 11   Date for PT Re-Evaluation 08/13/15   PT Start Time 1615   PT Stop Time 1715   PT Time Calculation (min) 60 min      Past Medical History  Diagnosis Date  . Hypertension   . Hyperlipidemia   . PAF (paroxysmal atrial fibrillation) (HCC)   . Obesity   . History of heartburn   . High risk medication use     Flecainide therapy  . Syncope July 2012  . Normal nuclear stress test July 2012  . LVH (left ventricular hypertrophy)     Per echo in July 2012  . Diastolic dysfunction     Grade I, per echo in 2012  . CAD (coronary artery disease)     Mild per remote cath in 2004; normal nuclear in 2012  . Complication of anesthesia     slow to wake up  . Dysrhythmia   . Pneumonia     20 yrs ago  . GERD (gastroesophageal reflux disease)     prevacid  . H/O hiatal hernia   . Headache(784.0)     sinus  . Arthritis   . Chronic kidney disease     stage3 kidney disease     Past Surgical History  Procedure Laterality Date  . Cardiac catheterization  07/28/2002    EF GREATER THAN 55%; MILD CAD  . Back surgery  1980, 2010    x2  . Tonsillectomy      age 70 yrs  . Appendectomy  1952  . Tubal ligation    . Dilation and curettage of uterus    . Colonoscopy    . Lumbar laminectomy/decompression microdiscectomy Right 02/15/2015    Procedure: Right Thoracic twelve-Lumbar one  Microdiskectomy;  Surgeon: Tressie StalkerJeffrey Jenkins, MD;  Location: [facility];  Service: Neurosurgery;  Laterality: Right;  Thoracic/Lumbar    There were no vitals filed for this visit.      Subjective Assessment - 07/15/15 1621    Subjective 3 days since nerve burning NE yet.    Currently in Pain? Yes   Pain Score 8    Pain Location Back   Pain Orientation Right   Pain Descriptors / Indicators Burning;Clance BollSharp                         [facility] Adult PT Treatment/Exercise - 07/15/15 0001    Lumbar Exercises: Aerobic   Stationary Bike Nustep L 5 7 min   UBE (Upper Arm Bike) 242fwd/2 back L 2   Lumbar Exercises: Machines for Strengthening   Cybex Knee Extension #5 3x10   Cybex Knee Flexion #20 3x10   Other Lumbar Machine Exercise blue tbadn flexion 2 sets 10   Other Lumbar Machine Exercise seated row 20# 2 sets 10  lat pull 20# 2 set s10   Lumbar Exercises: Standing   Other Standing Lumbar Exercises red tband hip 3 way 15 times each   Moist Heat Therapy   Number Minutes Moist Heat 15 Minutes   Moist Heat Location Lumbar Spine   Electrical Stimulation   Electrical Stimulation Location  Lumbar   Electrical Stimulation Action IFC   Electrical Stimulation Goals Pain                  PT Short Term Goals - 07/08/15 1545    PT SHORT TERM GOAL #1   Title independent with initial HEP   Status Achieved           PT Long Term Goals - 07/15/15 1640    PT LONG TERM GOAL #2   Title increase lumbar ROM 25%   Status On-going   PT LONG TERM GOAL #3   Title decrease pain 25%   Status On-going   PT LONG TERM GOAL #4   Title report taking 1/2 as much medicine, she is taking 1 1/2 pain pills a day currently at eval   Status On-going   PT LONG TERM GOAL #5   Title report sleep is 25% better   Status Achieved               Plan - 07/15/15 1640    Clinical Impression Statement educated on gym safety and transition,handout issued.   PT Next Visit Plan 1 time week as assures transition to gym      Patient will benefit from skilled therapeutic intervention in order to improve the following deficits and impairments:  Decreased activity tolerance, Decreased mobility, Decreased range of  motion, Decreased safety awareness, Decreased strength, Difficulty walking, Increased muscle spasms, Impaired flexibility, Postural dysfunction, Improper body mechanics, Pain  Visit Diagnosis: Bilateral low back pain without sciatica     Problem List Patient Active Problem List   Diagnosis Date Noted  . Lumbar herniated disc 02/15/2015  . CKD (chronic kidney disease) stage 3, GFR 30-59 ml/min 06/22/2014  . Spondylolisthesis of lumbar region 08/07/2013  . Hyperlipidemia 06/13/2008  . ATRIAL FIBRILLATION 06/13/2008  . Essential hypertension 11/05/2006  . Coronary atherosclerosis 11/05/2006    Cheryl [name],Cheryl [name] 07/15/2015, 4:43 PM  [facility] [address] [place], [place], [address] Phone: [phone]   Fax:  [phone]  Name: Cheryl [name] MRN: [id number] Date of Birth: 1934-12-06

## 2015-07-16 ENCOUNTER — Ambulatory Visit: Payer: Medicare Other | Admitting: Physical Therapy

## 2015-07-22 ENCOUNTER — Ambulatory Visit: Payer: Medicare Other | Admitting: Physical Therapy

## 2015-07-22 ENCOUNTER — Encounter: Payer: Self-pay | Admitting: Physical Therapy

## 2015-07-22 DIAGNOSIS — M545 Low back pain, unspecified: Secondary | ICD-10-CM

## 2015-07-22 NOTE — Therapy (Signed)
[facility] [address] [place], [place], [address] Phone: [phone]   Fax:  [phone]  Physical Therapy Treatment  Patient Details  Name: Cheryl [name] MRN: [id number] Date of Birth: 09-Feb-1934 Referring Provider: Naaman PlummerFred Newton  Encounter Date: 07/22/2015      PT End of Session - 07/22/15 1609    Visit Number 12   Date for PT Re-Evaluation 08/13/15   PT Start Time 1540   PT Stop Time 1640   PT Time Calculation (min) 60 min      Past Medical History  Diagnosis Date  . Hypertension   . Hyperlipidemia   . PAF (paroxysmal atrial fibrillation) (HCC)   . Obesity   . History of heartburn   . High risk medication use     Flecainide therapy  . Syncope July 2012  . Normal nuclear stress test July 2012  . LVH (left ventricular hypertrophy)     Per echo in July 2012  . Diastolic dysfunction     Grade I, per echo in 2012  . CAD (coronary artery disease)     Mild per remote cath in 2004; normal nuclear in 2012  . Complication of anesthesia     slow to wake up  . Dysrhythmia   . Pneumonia     20 yrs ago  . GERD (gastroesophageal reflux disease)     prevacid  . H/O hiatal hernia   . Headache(784.0)     sinus  . Arthritis   . Chronic kidney disease     stage3 kidney disease     Past Surgical History  Procedure Laterality Date  . Cardiac catheterization  07/28/2002    EF GREATER THAN 55%; MILD CAD  . Back surgery  1980, 2010    x2  . Tonsillectomy      age 81 yrs  . Appendectomy  1952  . Tubal ligation    . Dilation and curettage of uterus    . Colonoscopy    . Lumbar laminectomy/decompression microdiscectomy Right 02/15/2015    Procedure: Right Thoracic twelve-Lumbar one  Microdiskectomy;  Surgeon: Tressie StalkerJeffrey Jenkins, MD;  Location: [facility];  Service: Neurosurgery;  Laterality: Right;  Thoracic/Lumbar    There were no vitals filed for this visit.      Subjective Assessment - 07/22/15 1539    Subjective this therapy really helps, I have been to the gym 1 time   Currently in Pain? Yes   Pain Score 5    Pain Location Back                         [facility] Adult PT Treatment/Exercise - 07/22/15 0001    Lumbar Exercises: Aerobic   Stationary Bike Nustep L 5 7 min   Lumbar Exercises: Machines for Strengthening   Leg Press 30# 2 sets 15   Other Lumbar Machine Exercise green tband obl 15 times each   Lumbar Exercises: Standing   Other Standing Lumbar Exercises ball vs wall 5 times each way CC and CCW   Other Standing Lumbar Exercises 6 inch step up with opp leg ext 15 times each   Lumbar Exercises: Seated   Sit to Stand 15 reps  wt ball   Sit to Stand Limitations sit fit core stab ex  sit fit red tband LE ther ex 15 reps   Moist Heat Therapy   Number Minutes Moist Heat 15 Minutes   Moist Heat  Location Lumbar Spine   Electrical Stimulation   Electrical Stimulation Location Lumbar   Electrical Stimulation Action IFC   Electrical Stimulation Goals Pain                  PT Short Term Goals - 07/08/15 1545    PT SHORT TERM GOAL #1   Title independent with initial HEP   Status Achieved           PT Long Term Goals - 07/22/15 1542    PT LONG TERM GOAL #1   Title understand proper posture and body mechanics   Status Achieved   PT LONG TERM GOAL #2   Title increase lumbar ROM 25%   Status On-going   PT LONG TERM GOAL #3   Title decrease pain 25%   Status Achieved   PT LONG TERM GOAL #4   Title report taking 1/2 as much medicine, she is taking 1 1/2 pain pills a day currently at eval   Status Achieved   PT LONG TERM GOAL #5   Title report sleep is 25% better   Status Achieved               Plan - 07/22/15 1609    Clinical Impression Statement progressing with goals and gym transition. Still pain but therapy helps   PT Next Visit Plan 1 time week as assures transition to gym      Patient will benefit from skilled therapeutic  intervention in order to improve the following deficits and impairments:  Decreased activity tolerance, Decreased mobility, Decreased range of motion, Decreased safety awareness, Decreased strength, Difficulty walking, Increased muscle spasms, Impaired flexibility, Postural dysfunction, Improper body mechanics, Pain  Visit Diagnosis: Bilateral low back pain without sciatica     Problem List Patient Active Problem List   Diagnosis Date Noted  . Lumbar herniated disc 02/15/2015  . CKD (chronic kidney disease) stage 3, GFR 30-59 ml/min 06/22/2014  . Spondylolisthesis of lumbar region 08/07/2013  . Hyperlipidemia 06/13/2008  . ATRIAL FIBRILLATION 06/13/2008  . Essential hypertension 11/05/2006  . Coronary atherosclerosis 11/05/2006    Cheryl [name],Cheryl [name] 07/22/2015, 4:12 PM  [facility] [place] [facility] [address] [place], [place], [address] Phone: [phone]   Fax:  [phone]  Name: Cheryl [name] MRN: [id number] Date of Birth: [birth year]

## 2015-07-27 ENCOUNTER — Telehealth: Payer: Self-pay | Admitting: Cardiology

## 2015-07-27 NOTE — Telephone Encounter (Signed)
Pt calling regarding irregular heartbeat-and some SOB-calling to see if that is anything she should be concerned about--pls call [phone]

## 2015-07-27 NOTE — Telephone Encounter (Addendum)
Left pt a message to call back, on pt's home phone and to the phone provided by operator at ph # [phone].

## 2015-07-27 NOTE — Telephone Encounter (Signed)
Pt return the call she states that she has been having irregular heart beats and SOB on and off for about two weeks. This week the irregular beats are more constant. Pt is taken Flecainide 50 mg once daily , but with the palpitations she has takes two times a day for last few days, and it has not help. Pt takes Metoprolol 25 mg once daily for her BP, but she stop taken it for 4 or 5 days, because her BP was 100/55. Pt's BP today is 134/75. Tereso NewcomerScott Weaver PA aware of pt's symptoms. PA recommends for pt to resume the metoprolol medication today, because she need to take the  Metoprolol with the Flecainide. Pt is to call tomorrow if she does not feel better. Pt is aware , she verbalized understanding.

## 2015-08-16 ENCOUNTER — Encounter: Payer: Self-pay | Admitting: Physical Therapy

## 2015-08-16 ENCOUNTER — Ambulatory Visit: Payer: Medicare Other | Attending: Physical Medicine and Rehabilitation | Admitting: Physical Therapy

## 2015-08-16 DIAGNOSIS — M545 Low back pain, unspecified: Secondary | ICD-10-CM

## 2015-08-16 NOTE — Therapy (Signed)
[facility] [address] [place], [place], [address] Phone: [phone]   Fax:  [phone]  Physical Therapy Treatment  Patient Details  Name: Cheryl [name] MRN: [id number] Date of Birth: 1934/03/31 Referring Provider: Naaman PlummerFred Newton  Encounter Date: 08/16/2015      PT End of Session - 08/16/15 1047    Visit Number 13   Date for PT Re-Evaluation 09/13/15   PT Start Time 1010   PT Stop Time 1050   PT Time Calculation (min) 40 min      Past Medical History  Diagnosis Date  . Hypertension   . Hyperlipidemia   . PAF (paroxysmal atrial fibrillation) (HCC)   . Obesity   . History of heartburn   . High risk medication use     Flecainide therapy  . Syncope July 2012  . Normal nuclear stress test July 2012  . LVH (left ventricular hypertrophy)     Per echo in July 2012  . Diastolic dysfunction     Grade I, per echo in 2012  . CAD (coronary artery disease)     Mild per remote cath in 2004; normal nuclear in 2012  . Complication of anesthesia     slow to wake up  . Dysrhythmia   . Pneumonia     20 yrs ago  . GERD (gastroesophageal reflux disease)     prevacid  . H/O hiatal hernia   . Headache(784.0)     sinus  . Arthritis   . Chronic kidney disease     stage3 kidney disease     Past Surgical History  Procedure Laterality Date  . Cardiac catheterization  07/28/2002    EF GREATER THAN 55%; MILD CAD  . Back surgery  1980, 2010    x2  . Tonsillectomy      age 81 yrs  . Appendectomy  1952  . Tubal ligation    . Dilation and curettage of uterus    . Colonoscopy    . Lumbar laminectomy/decompression microdiscectomy Right 02/15/2015    Procedure: Right Thoracic twelve-Lumbar one  Microdiskectomy;  Surgeon: Tressie StalkerJeffrey Jenkins, MD;  Location: [facility];  Service: Neurosurgery;  Laterality: Right;  Thoracic/Lumbar    There were no vitals filed for this visit.      Subjective Assessment - 08/16/15 1010    Subjective MD concerned with lateral shift an dmay need to burn nerves again on RT. Going well at gym.   Currently in Pain? Yes   Pain Score 4    Pain Location Back   Pain Orientation Right                         [facility] Adult PT Treatment/Exercise - 08/16/15 0001    Lumbar Exercises: Standing   Other Standing Lumbar Exercises hip hiking 2 inch 2 sets 10   Other Standing Lumbar Exercises lat trunk 4# 10 times   Lumbar Exercises: Seated   Sit to Stand Limitations seated on abll later weight shift ,LAQ,marching and hip abd   Manual Therapy   Manual Therapy Muscle Energy Technique   Manual therapy comments RT lat shift with left trunk lean  added wedge left show                PT Education - 08/16/15 1046    Education provided Yes   Education Details hip hiking, lat trunk shift 3-5 #, ball ROM and stab ex  Person(s) Educated Patient   Methods Explanation;Demonstration;Handout   Comprehension Verbalized understanding;Returned demonstration          PT Short Term Goals - 07/08/15 1545    PT SHORT TERM GOAL #1   Title independent with initial HEP   Status Achieved           PT Long Term Goals - 08/16/15 1050    PT LONG TERM GOAL #1   Title understand proper posture and body mechanics   Status Achieved   PT LONG TERM GOAL #2   Title increase lumbar ROM 25%   Status On-going   PT LONG TERM GOAL #3   Title decrease pain 25%   Status Achieved   PT LONG TERM GOAL #4   Status Achieved   PT LONG TERM GOAL #5   Title report sleep is 25% better   Status Achieved               Plan - 08/16/15 1048    Clinical Impression Statement pt returns today with verb instructions form MD to work on lateral shift. Pt has elevated RT hip, questionable ant rotation and Rt upper trunk lean to compensate. Issued new HEp adn heel lift for left.   PT Next Visit Plan asses HEP, lift and progress      Patient will benefit from skilled therapeutic  intervention in order to improve the following deficits and impairments:  Decreased activity tolerance, Decreased mobility, Decreased range of motion, Decreased safety awareness, Decreased strength, Difficulty walking, Increased muscle spasms, Impaired flexibility, Postural dysfunction, Improper body mechanics, Pain  Visit Diagnosis: Bilateral low back pain without sciatica     Problem List Patient Active Problem List   Diagnosis Date Noted  . Lumbar herniated disc 02/15/2015  . CKD (chronic kidney disease) stage 3, GFR 30-59 ml/min 06/22/2014  . Spondylolisthesis of lumbar region 08/07/2013  . Hyperlipidemia 06/13/2008  . ATRIAL FIBRILLATION 06/13/2008  . Essential hypertension 11/05/2006  . Coronary atherosclerosis 11/05/2006    Naiyana Barbian,ANGIE PTA 08/16/2015, 10:51 AM  [facility] [address] [place], [place], [address] Phone: [phone]   Fax:  [phone]  Name: Cheryl [name] MRN: [id number] Date of Birth: [birth year]

## 2015-08-26 ENCOUNTER — Encounter: Payer: Self-pay | Admitting: Physical Therapy

## 2015-08-26 ENCOUNTER — Ambulatory Visit: Payer: Medicare Other | Admitting: Physical Therapy

## 2015-08-26 DIAGNOSIS — M545 Low back pain, unspecified: Secondary | ICD-10-CM

## 2015-08-26 NOTE — Therapy (Addendum)
[facility] [place], [place], [address] Phone: [phone]   Fax:  [phone]  Physical Therapy Treatment  Patient Details  Name: Cheryl [name] MRN: [id number] Date of Birth: May 03, 1934 Referring Provider: Laurence Spates  Encounter Date: 08/26/2015      PT End of Session - 08/26/15 1535    Visit Number 14   Date for PT Re-Evaluation 09/13/15   PT Start Time 1779   PT Stop Time 1545   PT Time Calculation (min) 60 min      Past Medical History  Diagnosis Date  . Hypertension   . Hyperlipidemia   . PAF (paroxysmal atrial fibrillation) ([place])   . Obesity   . History of heartburn   . High risk medication use     Flecainide therapy  . Syncope July 2012  . Normal nuclear stress test July 2012  . LVH (left ventricular hypertrophy)     Per echo in July 2012  . Diastolic dysfunction     Grade I, per echo in 2012  . CAD (coronary artery disease)     Mild per remote cath in 2004; normal nuclear in 2012  . Complication of anesthesia     slow to wake up  . Dysrhythmia   . Pneumonia     20 yrs ago  . GERD (gastroesophageal reflux disease)     prevacid  . H/O hiatal hernia   . Headache(784.0)     sinus  . Arthritis   . Chronic kidney disease     stage3 kidney disease     Past Surgical History  Procedure Laterality Date  . Cardiac catheterization  07/28/2002    EF GREATER THAN 55%; MILD CAD  . Back surgery  1980, 2010    x2  . Tonsillectomy      age 19 yrs  . Appendectomy  1952  . Tubal ligation    . Dilation and curettage of uterus    . Colonoscopy    . Lumbar laminectomy/decompression microdiscectomy Right 02/15/2015    Procedure: Right Thoracic twelve-Lumbar one  Microdiskectomy;  Surgeon: Newman Pies, MD;  Location: [facility];  Service: Neurosurgery;  Laterality: Right;  Thoracic/Lumbar    There were no vitals filed for this visit.      Subjective Assessment - 08/26/15 1527    Subjective injection tues and been so sore, I am exhausted. Doing okay at gym but thinking of getting trainer. Heel lift is doing okay.   Currently in Pain? Yes   Pain Score 5    Pain Location Back                         [facility] Adult PT Treatment/Exercise - 08/26/15 0001    Lumbar Exercises: Aerobic   UBE (Upper Arm Bike) 2 fwd/2 back standing   Lumbar Exercises: Standing   Other Standing Lumbar Exercises pulleys lat flexion 10 each,trunk rotation 10 each  2 sets each   Other Standing Lumbar Exercises pulley hip ext and abd 10 times each   Lumbar Exercises: Seated   Sit to Stand Limitations sit fit pelive ROM 15 times,red tband LE ther ex on sit fit 15 times each   Moist Heat Therapy   Number Minutes Moist Heat 15 Minutes   Moist Heat Location Lumbar Spine   Electrical Stimulation   Electrical Stimulation Location Lumbar   Electrical Stimulation Action IFC   Electrical Stimulation Goals Pain  PT Short Term Goals - 07/08/15 1545    PT SHORT TERM GOAL #1   Title independent with initial HEP   Status Achieved           PT Long Term Goals - 08/26/15 1532    PT LONG TERM GOAL #1   Status Achieved   PT LONG TERM GOAL #2   Title increase lumbar ROM 25%   Status On-going   PT LONG TERM GOAL #3   Title decrease pain 25%   Status Achieved   PT LONG TERM GOAL #4   Title report taking 1/2 as much medicine, she is taking 1 1/2 pain pills a day currently at eval   Status Achieved   PT LONG TERM GOAL #5   Title report sleep is 25% better   Status Achieved               Plan - 08/26/15 1535    Clinical Impression Statement pt in pain today and very fatigued, tearfula nd just wanting to be stronger so she can keep walking. Worked on lateral mvmt for ROM and strength. Core strengthening. Discussed gym ex adn possibly working with trainer.    PT Next Visit Plan asses HEP progress      Patient will benefit from skilled  therapeutic intervention in order to improve the following deficits and impairments:  Decreased activity tolerance, Decreased mobility, Decreased range of motion, Decreased safety awareness, Decreased strength, Difficulty walking, Increased muscle spasms, Impaired flexibility, Postural dysfunction, Improper body mechanics, Pain  Visit Diagnosis: Bilateral low back pain without sciatica     Problem List Patient Active Problem List   Diagnosis Date Noted  . Lumbar herniated disc 02/15/2015  . CKD (chronic kidney disease) stage 3, GFR 30-59 ml/min 06/22/2014  . Spondylolisthesis of lumbar region 08/07/2013  . Hyperlipidemia 06/13/2008  . ATRIAL FIBRILLATION 06/13/2008  . Essential hypertension 11/05/2006  . Coronary atherosclerosis 11/05/2006   PHYSICAL THERAPY DISCHARGE SUMMARY   Plan: Patient agrees to discharge.  Patient goals were met. Patient is being discharged due to being pleased with the current functional level.  ?????      Rickard Kennerly,ANGIE PTA 08/26/2015, 3:41 PM  [facility] [place], [place], [address] Phone: [phone]   Fax:  [phone]  Name: Cheryl [name] MRN: [id number] Date of Birth: 08-08-1934

## 2015-11-11 ENCOUNTER — Ambulatory Visit (INDEPENDENT_AMBULATORY_CARE_PROVIDER_SITE_OTHER): Payer: Medicare Other | Admitting: Orthopaedic Surgery

## 2015-11-11 ENCOUNTER — Encounter (INDEPENDENT_AMBULATORY_CARE_PROVIDER_SITE_OTHER): Payer: Self-pay

## 2015-11-11 DIAGNOSIS — M25561 Pain in right knee: Secondary | ICD-10-CM | POA: Diagnosis not present

## 2015-11-11 DIAGNOSIS — M545 Low back pain: Secondary | ICD-10-CM

## 2015-11-11 DIAGNOSIS — M25551 Pain in right hip: Secondary | ICD-10-CM

## 2015-11-15 ENCOUNTER — Other Ambulatory Visit: Payer: Self-pay | Admitting: Cardiology

## 2015-12-01 ENCOUNTER — Encounter (INDEPENDENT_AMBULATORY_CARE_PROVIDER_SITE_OTHER): Payer: Self-pay | Admitting: Physical Medicine and Rehabilitation

## 2015-12-01 ENCOUNTER — Ambulatory Visit (INDEPENDENT_AMBULATORY_CARE_PROVIDER_SITE_OTHER): Payer: Medicare Other | Admitting: Physical Medicine and Rehabilitation

## 2015-12-01 VITALS — BP 174/90 | HR 72 | Temp 98.0°F

## 2015-12-01 DIAGNOSIS — M461 Sacroiliitis, not elsewhere classified: Secondary | ICD-10-CM | POA: Diagnosis not present

## 2015-12-01 DIAGNOSIS — M5416 Radiculopathy, lumbar region: Secondary | ICD-10-CM

## 2015-12-01 NOTE — Progress Notes (Signed)
Office Visit Note   Patient: Cheryl [name]           Date of Birth: [birth year]           MRN: [id number] Visit Date: 12/01/2015              Requested by: Laurann Montana, MD [phone] [address] [place], [place] [address] PCP: Cala Bradford, MD   Assessment & Plan: Visit Diagnoses:  1. Lumbar radiculopathy   2. Sacroiliitis (HCC)     Plan: Chronic worsening low back and bilateral buttock pain with left radicular type symptoms in an L5 and S1 distribution to the foot. She also has this right sided sacroiliac joint pain which is fairly consistent with the sacroiliac joint. We've never completed a diagnostic sacroiliac joint injection. At this point were to see how she does over time since it has seemingly got a little bit better over the last 3 weeks. We've given her some exercises and some thoughts on exercise for her to do. Her husband is in early Alzheimer's dementia and he continues to walk but she is unable to walk. We talked about going to the gym solving using recumbent bike. We'll see overall how she is doing and she'll call us as needed for one of these injections and would either be a right sacroiliac joint injection diagnostically or a left transforaminal epidural injection.  Follow-Up Instructions: Return if symptoms worsen or fail to improve, for possible right SI joint injection versus left L5 or S1 trans esi.   Orders:  No orders of the defined types were placed in this encounter.  No orders of the defined types were placed in this encounter.     Procedures: No procedures performed   Clinical Data: No additional findings.   Subjective: Chief Complaint  Patient presents with  . Lower Back - Pain    Back Pain  This is a recurrent problem. The current episode started more than 1 month ago. The problem occurs constantly. The problem is unchanged. The pain is present in the gluteal and lumbar spine. The quality of the pain is described as burning. The pain  radiates to the left foot and left thigh. The pain is at a severity of 4/10. The pain is moderate. The symptoms are aggravated by standing. Stiffness is present in the morning. Associated symptoms include tingling. Pertinent negatives include no abdominal pain, chest pain, fever or headaches.   She has a prior history of lumbar fusion by Dr. Lovell Sheehan. She saw Maryanna Shape this last month and he felt like she was having sacroiliitis particularly on the right. She does have x-ray imaging showing some sacroiliac joint degenerative changes bilaterally. She is getting more left paresthesias in an L5 and S1 distribution. She is actually having a fairly good day today and she is trying to maintain her activities. She does continue to use ibuprofen.   Review of Systems  Constitutional: Negative for chills, fatigue, fever and unexpected weight change.  HENT: Negative for sore throat and trouble swallowing.   Eyes: Negative for photophobia and visual disturbance.  Respiratory: Negative for chest tightness and shortness of breath.   Cardiovascular: Negative for chest pain.  Gastrointestinal: Negative for abdominal pain.  Endocrine: Negative for cold intolerance and heat intolerance.  Musculoskeletal: Positive for back pain. Negative for myalgias.  Skin: Negative for color change and rash.  Neurological: Positive for tingling. Negative for speech difficulty and headaches.  Psychiatric/Behavioral: Negative for confusion. The patient is  not nervous/anxious.   All other systems reviewed and are negative.    Objective: Vital Signs: BP (!) 174/90 (BP Location: Left Arm, Patient Position: Sitting, Cuff Size: Normal)   Pulse 72   Temp 98 F (36.7 C) (Oral)   SpO2 99%   Physical Exam  Constitutional: She is oriented to person, place, and time. She appears well-developed and well-nourished.  Eyes: Conjunctivae and EOM are normal. Pupils are equal, round, and reactive to light.  Cardiovascular: Normal rate  and intact distal pulses.   Pulmonary/Chest: Effort normal.  Neurological: She is alert and oriented to person, place, and time.  Skin: Skin is warm and dry. No erythema.  Psychiatric: She has a normal mood and affect. Her behavior is normal.  Nursing note and vitals reviewed.   Ortho Exam The patient ambulates without aid. She has a positive forward finger sign on the right. She has no pain over the greater trochanters. She has a negative slump test bilaterally. She does have impaired sensation in more of an L5 and S1 dermatomal the left. She has no clonus bilaterally with good distal strength. Specialty Comments:  No specialty comments available.  Imaging: No results found.   PMFS History: Patient Active Problem List   Diagnosis Date Noted  . Lumbar herniated disc 02/15/2015  . CKD (chronic kidney disease) stage 3, GFR 30-59 ml/min 06/22/2014  . Spondylolisthesis of lumbar region 08/07/2013  . Hyperlipidemia 06/13/2008  . ATRIAL FIBRILLATION 06/13/2008  . Essential hypertension 11/05/2006  . Coronary atherosclerosis 11/05/2006   Past Medical History:  Diagnosis Date  . Arthritis   . CAD (coronary artery disease)    Mild per remote cath in 2004; normal nuclear in 2012  . Chronic kidney disease    stage3 kidney disease   . Complication of anesthesia    slow to wake up  . Diastolic dysfunction    Grade I, per echo in 2012  . Dysrhythmia   . GERD (gastroesophageal reflux disease)    prevacid  . H/O hiatal hernia   . Headache(784.0)    sinus  . High risk medication use    Flecainide therapy  . History of heartburn   . Hyperlipidemia   . Hypertension   . LVH (left ventricular hypertrophy)    Per echo in July 2012  . Normal nuclear stress test July 2012  . Obesity   . PAF (paroxysmal atrial fibrillation) (HCC)   . Pneumonia    20 yrs ago  . Syncope July 2012    Family History  Problem Relation Age of Onset  . Lung cancer Father     Past Surgical History:    Procedure Laterality Date  . APPENDECTOMY  1952  . BACK SURGERY  1980, 2010   x2  . CARDIAC CATHETERIZATION  07/28/2002   EF GREATER THAN 55%; MILD CAD  . COLONOSCOPY    . DILATION AND CURETTAGE OF UTERUS    . LUMBAR LAMINECTOMY/DECOMPRESSION MICRODISCECTOMY Right 02/15/2015   Procedure: Right Thoracic twelve-Lumbar one  Microdiskectomy;  Surgeon: Tressie StalkerJeffrey Jenkins, MD;  Location: [facility];  Service: Neurosurgery;  Laterality: Right;  Thoracic/Lumbar  . TONSILLECTOMY     age 76 yrs  . TUBAL LIGATION     Social History   Occupational History  . Not on file.   Social History Main Topics  . Smoking status: Never Smoker  . Smokeless tobacco: Never Used  . Alcohol use No  . Drug use: No  . Sexual activity: Not on file

## 2015-12-10 ENCOUNTER — Telehealth (INDEPENDENT_AMBULATORY_CARE_PROVIDER_SITE_OTHER): Payer: Self-pay

## 2015-12-10 NOTE — Telephone Encounter (Signed)
FYI-patient called and wanted to schedule the Right Si joint injection that you all had discussed at her last visit. She is scheduled for 12/20/15 @ 1:30.

## 2015-12-20 ENCOUNTER — Encounter (INDEPENDENT_AMBULATORY_CARE_PROVIDER_SITE_OTHER): Payer: Self-pay | Admitting: Physical Medicine and Rehabilitation

## 2015-12-20 ENCOUNTER — Ambulatory Visit (INDEPENDENT_AMBULATORY_CARE_PROVIDER_SITE_OTHER): Payer: Medicare Other | Admitting: Physical Medicine and Rehabilitation

## 2015-12-20 VITALS — BP 142/84 | HR 59

## 2015-12-20 DIAGNOSIS — M461 Sacroiliitis, not elsewhere classified: Secondary | ICD-10-CM | POA: Diagnosis not present

## 2015-12-20 NOTE — Patient Instructions (Signed)

## 2015-12-20 NOTE — Progress Notes (Signed)
Cheryl [name] - 81 y.o. female MRN [id number]  Date of birth: [birth year]  Office Visit Note: Visit Date: 12/20/2015 PCP: Cala BradfordWHITE,CYNTHIA S, MD Referred by: Laurann MontanaWhite, Cynthia, MD  Subjective: Chief Complaint  Patient presents with  . Lower Back - Pain   HPI: [name] is a 81 year old patient with prior lumbar fusion with continued right buttock pain and left radicular pain. Please see her last evaluation and management no for further details justification. Patient is here today for right Si joint injection that was discussed at her last appointment. States no change in symptoms.    ROS Otherwise per HPI.  Assessment & Plan: Visit Diagnoses:  1. Sacroiliitis (HCC)     Plan: No additional findings.   Meds & Orders: No orders of the defined types were placed in this encounter.   Orders Placed This Encounter  Procedures  . Large Joint Injection/Arthrocentesis    Follow-up: Return if symptoms worsen or fail to improve, for schedule for left side epidural, see last note.   Procedures: Sacroiliac Joint Inj Date/Time: 12/20/2015 1:49 PM Performed by: Tyrell AntonioNEWTON, Leshon Armistead Authorized by: Tyrell AntonioNEWTON, Lory Galan   Consent Given by:  Patient Site marked: the procedure site was marked   Timeout: prior to procedure the correct patient, procedure, and site was verified   Indications:  Pain Location:  Sacroiliac Site:  R sacroiliac joint Prep: patient was prepped and draped in usual sterile fashion   Needle Size:  22 G Needle Length:  3.5 inches Approach:  Posterior Ultrasound Guidance: No   Fluoroscopic Guidance: Yes   Arthrogram: No   Medications:  2 mL bupivacaine 0.5 %; 80 mg methylPREDNISolone acetate 80 MG/ML Aspiration Attempted: No   Patient tolerance:  Patient tolerated the procedure well with no immediate complications  There is excellent flow of contrast noticing partial arthrogram of the sacroiliac joint.      No notes on file   Clinical History: No specialty comments  available.  She reports that she has never smoked. She has never used smokeless tobacco. No results for input(s): HGBA1C, LABURIC in the last 8760 hours.  Objective:  VS:  HT:    WT:   BMI:     BP:(!) 142/84  HR:(!) 59bpm  TEMP: ( )  RESP:98 % Physical Exam  Ortho Exam Imaging: No results found.  Past Medical/Family/Surgical/Social History: Medications & Allergies reviewed per EMR Patient Active Problem List   Diagnosis Date Noted  . Lumbar herniated disc 02/15/2015  . CKD (chronic kidney disease) stage 3, GFR 30-59 ml/min 06/22/2014  . Spondylolisthesis of lumbar region 08/07/2013  . Hyperlipidemia 06/13/2008  . ATRIAL FIBRILLATION 06/13/2008  . Essential hypertension 11/05/2006  . Coronary atherosclerosis 11/05/2006   Past Medical History:  Diagnosis Date  . Arthritis   . CAD (coronary artery disease)    Mild per remote cath in 2004; normal nuclear in 2012  . Chronic kidney disease    stage3 kidney disease   . Complication of anesthesia    slow to wake up  . Diastolic dysfunction    Grade I, per echo in 2012  . Dysrhythmia   . GERD (gastroesophageal reflux disease)    prevacid  . H/O hiatal hernia   . Headache(784.0)    sinus  . High risk medication use    Flecainide therapy  . History of heartburn   . Hyperlipidemia   . Hypertension   . LVH (left ventricular hypertrophy)    Per echo in July 2012  . Normal nuclear stress  test July 2012  . Obesity   . PAF (paroxysmal atrial fibrillation) (HCC)   . Pneumonia    20 yrs ago  . Syncope July 2012   Family History  Problem Relation Age of Onset  . Lung cancer Father    Past Surgical History:  Procedure Laterality Date  . APPENDECTOMY  1952  . BACK SURGERY  1980, 2010   x2  . CARDIAC CATHETERIZATION  07/28/2002   EF GREATER THAN 55%; MILD CAD  . COLONOSCOPY    . DILATION AND CURETTAGE OF UTERUS    . LUMBAR LAMINECTOMY/DECOMPRESSION MICRODISCECTOMY Right 02/15/2015   Procedure: Right Thoracic  twelve-Lumbar one  Microdiskectomy;  Surgeon: Tressie StalkerJeffrey Jenkins, MD;  Location: [facility];  Service: Neurosurgery;  Laterality: Right;  Thoracic/Lumbar  . TONSILLECTOMY     age 73 yrs  . TUBAL LIGATION     Social History   Occupational History  . Not on file.   Social History Main Topics  . Smoking status: Never Smoker  . Smokeless tobacco: Never Used  . Alcohol use No  . Drug use: No  . Sexual activity: Not on file

## 2015-12-21 MED ORDER — METHYLPREDNISOLONE ACETATE 80 MG/ML IJ SUSP
80.0000 mg | INTRAMUSCULAR | Status: AC | PRN
  Administered 2015-12-20: 80 mg via INTRA_ARTICULAR

## 2015-12-21 MED ORDER — BUPIVACAINE HCL 0.5 % IJ SOLN
2.0000 mL | INTRAMUSCULAR | Status: AC | PRN
  Administered 2015-12-20: 2 mL via INTRA_ARTICULAR

## 2016-01-03 ENCOUNTER — Encounter (INDEPENDENT_AMBULATORY_CARE_PROVIDER_SITE_OTHER): Payer: Medicare Other | Admitting: Physical Medicine and Rehabilitation

## 2016-02-10 ENCOUNTER — Other Ambulatory Visit: Payer: Self-pay | Admitting: Cardiology

## 2016-02-16 ENCOUNTER — Telehealth: Payer: Self-pay | Admitting: Cardiology

## 2016-02-16 NOTE — Telephone Encounter (Signed)
°  New Prob  Pt c/o Shortness Of Breath: STAT if SOB developed within the last 24 hours or pt is noticeably SOB on the phone  1. Are you currently SOB (can you hear that pt is SOB on the phone)? Yes.   2. How long have you been experiencing SOB? 1 month.  3. Are you SOB when sitting or when up moving around? Both. Worsened with exertion which causes her to become very fatigued.  4. Are you currently experiencing any other symptoms? No.

## 2016-02-16 NOTE — Telephone Encounter (Signed)
Spoke with pt who reports s/s have been going on for a while and she just wants to be checked out.  She reports "being tired of feeling tired."  Advised Dr Anne FuSkains will see her and evaluate her s/s as scheduled tomorrow at her appt.  She is in agreement.

## 2016-02-17 ENCOUNTER — Encounter: Payer: Self-pay | Admitting: Cardiology

## 2016-02-17 ENCOUNTER — Ambulatory Visit (INDEPENDENT_AMBULATORY_CARE_PROVIDER_SITE_OTHER): Payer: Medicare Other | Admitting: Cardiology

## 2016-02-17 VITALS — BP 120/78 | HR 60 | Ht 64.0 in | Wt 150.8 lb

## 2016-02-17 DIAGNOSIS — R0602 Shortness of breath: Secondary | ICD-10-CM | POA: Diagnosis not present

## 2016-02-17 DIAGNOSIS — N183 Chronic kidney disease, stage 3 unspecified: Secondary | ICD-10-CM

## 2016-02-17 DIAGNOSIS — I493 Ventricular premature depolarization: Secondary | ICD-10-CM

## 2016-02-17 DIAGNOSIS — I10 Essential (primary) hypertension: Secondary | ICD-10-CM | POA: Diagnosis not present

## 2016-02-17 MED ORDER — FLECAINIDE ACETATE 50 MG PO TABS: 50.0000 mg | ORAL_TABLET | Freq: Every day | ORAL | 3 refills | Status: DC

## 2016-02-17 NOTE — Progress Notes (Signed)
[address] [place], [place]  [address] Phone: [phone] Fax:  [phone]  Date:  02/17/2016   ID:  Cheryl QualeLina B Kobler, DOB October 10, 1934, MRN [id number]  PCP:  Cala BradfordWHITE,CYNTHIA S, MD   History of Present Illness: Cheryl [name] is a 82 y.o. female (pronounced Lie na) with atrial fibrillation currently on antiarrhythmic flecainide here for followup. She had not currently been on anticoagulation despite extensive counseling on risk of stroke. She refused.  Likely familial hyperlipidemia, LDL 211 in September of 2013. Does not wish to take statin medication.  Nor does she want to take chronic anticoagulation. Lengthy discussion.  back surgery in July 2015, fusion. No afib.  She will occasionally take an extra flecainide to help her with racing heartbeat. This seems to work well for her. Continue.  Her husband has been diagnosed with Alzheimer's.   Wt Readings from Last 3 Encounters:  02/17/16 150 lb 12.8 oz (68.4 kg)  11/11/15 143 lb (64.9 kg)  03/04/15 152 lb (68.9 kg)     Past Medical History:  Diagnosis Date  . Arthritis   . CAD (coronary artery disease)    Mild per remote cath in 2004; normal nuclear in 2012  . Chronic kidney disease    stage3 kidney disease   . Complication of anesthesia    slow to wake up  . Diastolic dysfunction    Grade I, per echo in 2012  . Dysrhythmia   . GERD (gastroesophageal reflux disease)    prevacid  . H/O hiatal hernia   . Headache(784.0)    sinus  . High risk medication use    Flecainide therapy  . History of heartburn   . Hyperlipidemia   . Hypertension   . LVH (left ventricular hypertrophy)    Per echo in July 2012  . Normal nuclear stress test July 2012  . Obesity   . PAF (paroxysmal atrial fibrillation) (HCC)   . Pneumonia    20 yrs ago  . Syncope July 2012    Past Surgical History:  Procedure Laterality Date  . APPENDECTOMY  1952  . BACK SURGERY  1980, 2010   x2  . CARDIAC CATHETERIZATION   07/28/2002   EF GREATER THAN 55%; MILD CAD  . COLONOSCOPY    . DILATION AND CURETTAGE OF UTERUS    . LUMBAR LAMINECTOMY/DECOMPRESSION MICRODISCECTOMY Right 02/15/2015   Procedure: Right Thoracic twelve-Lumbar one  Microdiskectomy;  Surgeon: Tressie StalkerJeffrey Jenkins, MD;  Location: [facility];  Service: Neurosurgery;  Laterality: Right;  Thoracic/Lumbar  . TONSILLECTOMY     age 82 yrs  . TUBAL LIGATION      Current Outpatient Prescriptions  Medication Sig Dispense Refill  . aspirin EC 81 MG tablet Take 81 mg by mouth daily.    . Biotin 10 MG CAPS Take 10 mg by mouth daily.    . Cholecalciferol (VITAMIN D) 2000 UNITS tablet Take 2,000 Units by mouth daily.      Marland Kitchen. docusate sodium (COLACE) 100 MG capsule Take 1 capsule (100 mg total) by mouth 2 (two) times daily. 60 capsule 0  . flecainide (TAMBOCOR) 50 MG tablet Take 1 tablet (50 mg total) by mouth daily. 90 tablet 3  . loratadine (CLARITIN) 10 MG tablet Take 10 mg by mouth daily as needed for allergies.    . metoprolol succinate (TOPROL-XL) 25 MG 24 hr tablet Take 25 mg by mouth daily.  0  . traZODone (DESYREL) 100 MG tablet Take 100  mg by mouth at bedtime.       No current facility-administered medications for this visit.     Allergies:    Allergies  Allergen Reactions  . Amlodipine Besylate     REACTION: dizzy  . Codeine Phosphate     headache  . Lipitor [Atorvastatin Calcium]     Muscle pain and tiredness  . Sulfonamide Derivatives     REACTION: unspecified    Social History:  The patient  reports that she has never smoked. She has never used smokeless tobacco. She reports that she does not drink alcohol or use drugs.   ROS:  Please see the history of present illness.    Positive for abdominal pain, dizziness, easy bruising, constipation, anxiety, walking issues, skipping heartbeats, chest pressure.  All other systems reviewed and negative.   PHYSICAL EXAM: VS:  BP 120/78   Pulse 60   Ht 5\' 4"  (1.626 m)   Wt 150 lb 12.8 oz (68.4  kg)   BMI 25.88 kg/m  Well nourished, well developed, in no acute distress HEENT: normal Neck: no JVD Cardiac:  normal S1, S2; RRR; no murmur Lungs:  clear to auscultation bilaterally, no wheezing, rhonchi or rales Abd: soft, nontender, no hepatomegaly Ext: no edema Skin: warm and dry Neuro: no focal abnormalities noted  EKG:  EKG ordered 02/17/16-sinus rhythm, PACs. Left anterior fascicular block. Sinus rhythm left anterior fascicular block, PAC. No evidence of atrial fibrillation.     Echocardiogram 08/30/2010  - Left ventricle: The cavity size was normal. Wall thickness was increased in a pattern of moderate LVH. Systolic function was normal. The estimated ejection fraction was in the range of 55% to 65%. Wall motion was normal; there were no regional wall motion abnormalities. Doppler parameters are consistent with abnormal left ventricular relaxation (grade 1 diastolic dysfunction). - Mitral valve: There was systolic anterior motion. Mild regurgitation. - Atrial septum: No defect or patent foramen ovale was identified. - Pulmonary arteries: PA peak pressure: 32mm Hg (S).  ASSESSMENT AND PLAN:  1. Paroxysmal atrial fibrillation-previously expressed the importance of anticoagulation. She has been informed about stroke risk. CHADS-VAS -3. She states that she worked so hard to get off of warfarin previously. I explained the benefits of the NOAC. She clearly refuses to take anticoagulation. If she changes her mind, she can always contact me. Continue with flecainide for now. Taking low-dose Toprol. She states that she has been pill bottle if her heart rate increases. Occasional PACs noted on EKG. 2. Hypertension-normal today. She does not like taking triamterene hydrochlorothiazide, makes her feel sluggish she states. She also stopped taking her losartan. She is currently taking the metoprolol. Encouraged low sodium diet. Exercise. Medication.  3. Hyperlipidemia-currently  taking fish oil. LDL cholesterol was 211 on 09/30/12. Strongly encouraged statin therapy. Not interested currently. Understands risks, myocardial infarction, stroke. Likely familial hyperlipidemia. 4. Chronic kidney disease stage III-GFR in the 50 range. Likely secondary to hypertension. She was quite concerned about this. Discussed the importance of controlling blood pressure. Exercise. Avoiding NSAIDs. 5. Fatigue SOB - we will go ahead and schedule echocardiogram given her increased severity shortness of breath and fatigue. Some of this, also be the stress of her husbands diagnosis of Alzheimer's. 6. We will see back in 1 year.  Signed, Donato Schultz, MD [facility]  02/17/2016 4:22 PM

## 2016-02-17 NOTE — Patient Instructions (Signed)

## 2016-03-07 ENCOUNTER — Ambulatory Visit (HOSPITAL_COMMUNITY): Payer: Medicare Other | Attending: Cardiovascular Disease

## 2016-03-07 ENCOUNTER — Other Ambulatory Visit: Payer: Self-pay

## 2016-03-07 DIAGNOSIS — R0602 Shortness of breath: Secondary | ICD-10-CM

## 2016-03-07 DIAGNOSIS — I340 Nonrheumatic mitral (valve) insufficiency: Secondary | ICD-10-CM | POA: Insufficient documentation

## 2016-03-07 DIAGNOSIS — E785 Hyperlipidemia, unspecified: Secondary | ICD-10-CM | POA: Diagnosis not present

## 2016-03-07 DIAGNOSIS — N183 Chronic kidney disease, stage 3 (moderate): Secondary | ICD-10-CM | POA: Diagnosis not present

## 2016-03-07 DIAGNOSIS — I361 Nonrheumatic tricuspid (valve) insufficiency: Secondary | ICD-10-CM | POA: Diagnosis not present

## 2016-03-07 DIAGNOSIS — I129 Hypertensive chronic kidney disease with stage 1 through stage 4 chronic kidney disease, or unspecified chronic kidney disease: Secondary | ICD-10-CM | POA: Insufficient documentation

## 2016-03-07 DIAGNOSIS — I493 Ventricular premature depolarization: Secondary | ICD-10-CM

## 2016-03-07 DIAGNOSIS — I429 Cardiomyopathy, unspecified: Secondary | ICD-10-CM | POA: Diagnosis present

## 2016-03-08 ENCOUNTER — Other Ambulatory Visit: Payer: Self-pay | Admitting: *Deleted

## 2016-03-08 DIAGNOSIS — R931 Abnormal findings on diagnostic imaging of heart and coronary circulation: Secondary | ICD-10-CM

## 2016-03-09 ENCOUNTER — Ambulatory Visit (INDEPENDENT_AMBULATORY_CARE_PROVIDER_SITE_OTHER)
Admission: RE | Admit: 2016-03-09 | Discharge: 2016-03-09 | Disposition: A | Discharge status: Home / Self Care | Payer: Medicare Other | Source: Ambulatory Visit | Attending: Cardiology | Admitting: Cardiology

## 2016-03-09 DIAGNOSIS — R931 Abnormal findings on diagnostic imaging of heart and coronary circulation: Secondary | ICD-10-CM

## 2016-03-09 MED ORDER — IOPAMIDOL (ISOVUE-370) INJECTION 76%
80.0000 mL | Freq: Once | INTRAVENOUS | Status: AC | PRN
  Administered 2016-03-09: 80 mL via INTRAVENOUS

## 2016-05-25 ENCOUNTER — Telehealth (INDEPENDENT_AMBULATORY_CARE_PROVIDER_SITE_OTHER): Payer: Self-pay | Admitting: Physical Medicine and Rehabilitation

## 2016-05-25 NOTE — Telephone Encounter (Signed)
If similar to last then ok to repeat, if different or question then OV

## 2016-05-26 NOTE — Telephone Encounter (Signed)
I spoke with patient and the pain she is experiencing now is in the same area on the right side as before her last SI joint injection. She is scheduled for 06/06/16.

## 2016-06-06 ENCOUNTER — Ambulatory Visit (INDEPENDENT_AMBULATORY_CARE_PROVIDER_SITE_OTHER): Payer: Self-pay

## 2016-06-06 ENCOUNTER — Encounter (INDEPENDENT_AMBULATORY_CARE_PROVIDER_SITE_OTHER): Payer: Self-pay | Admitting: Physical Medicine and Rehabilitation

## 2016-06-06 ENCOUNTER — Ambulatory Visit (INDEPENDENT_AMBULATORY_CARE_PROVIDER_SITE_OTHER): Payer: Medicare Other | Admitting: Physical Medicine and Rehabilitation

## 2016-06-06 DIAGNOSIS — M461 Sacroiliitis, not elsewhere classified: Secondary | ICD-10-CM | POA: Diagnosis not present

## 2016-06-06 DIAGNOSIS — S336XXA Sprain of sacroiliac joint, initial encounter: Secondary | ICD-10-CM

## 2016-06-06 NOTE — Patient Instructions (Signed)

## 2016-06-06 NOTE — Progress Notes (Deleted)
Increased right hip and buttock pain for 3 weeks. No leg pain.

## 2016-06-06 NOTE — Progress Notes (Signed)
Cheryl [name] - 82 y.o. female MRN [id number]  Date of birth: 01/14/1935  Office Visit Note: Visit Date: 06/06/2016 PCP: Cala Bradford, MD Referred by: Laurann Montana, MD  Subjective: Chief Complaint  Patient presents with  . Lower Back - Pain   HPI: [name] is a pleasant 82 year old female well-known to [place] over the years with chronic lumbar spine issues and prior L4 to the sacrum fusion. We last saw her in November with right sacroiliac joint injection which was very beneficial for her and probably the best she's felt in years. She has had recent three-week worsening of progressive right hip and buttock pain that is very consistent with her prior pain pattern. We do see this quite frequently with fusions to the sacrum with the sacroiliac joint becomes affected. We are repeat the right sacroiliac joint injection today from a diagnostic and hopefully therapeutic standpoint.    ROS Otherwise per HPI.  Assessment & Plan: Visit Diagnoses:  1. Sacroiliitis (HCC)     Plan: Findings:  Right diagnostic and hopefully therapeutic sacroiliac joint injection. Arthrogram noted.    Meds & Orders: No orders of the defined types were placed in this encounter.   Orders Placed This Encounter  Procedures  . Large Joint Injection/Arthrocentesis  . XR C-ARM NO REPORT    Follow-up: Return if symptoms worsen or fail to improve.   Procedures: Sacroiliac joint injection with fluoroscopic guidance Date/Time: 06/06/2016 2:43 PM Performed by: Tyrell Antonio Authorized by: Tyrell Antonio   Consent Given by:  Patient Site marked: the procedure site was marked   Timeout: prior to procedure the correct patient, procedure, and site was verified   Indications:  Pain and diagnostic evaluation Location:  Sacroiliac Site:  R sacroiliac joint Prep: patient was prepped and draped in usual sterile fashion   Needle Size:  22 G Needle Length:  3.5 inches Approach:  Posterior Ultrasound Guidance:  No   Fluoroscopic Guidance: Yes   Arthrogram: No   Medications:  2 mL bupivacaine 0.5 %; 80 mg methylPREDNISolone acetate 80 MG/ML Aspiration Attempted: No   Patient tolerance:  Patient tolerated the procedure well with no immediate complications  There was excellent flow of contrast producing a partial arthrogram of the sacroiliac joint.      No notes on file   Clinical History: No specialty comments available.  She reports that she has never smoked. She has never used smokeless tobacco. No results for input(s): HGBA1C, LABURIC in the last 8760 hours.  Objective:  VS:  HT:    WT:   BMI:     BP:   HR: bpm  TEMP: ( )  RESP:  Physical Exam  Musculoskeletal:  Patient ambulates with a forward flexed spine without aid. She has good distal strength. She has a positive Fortin finger test on the right as well as positive Patrick's sign.    Ortho Exam Imaging: Xr C-arm No Report  Result Date: 06/06/2016 Please see Notes or Procedures tab for imaging impression.   Past Medical/Family/Surgical/Social History: Medications & Allergies reviewed per EMR Patient Active Problem List   Diagnosis Date Noted  . Lumbar herniated disc 02/15/2015  . CKD (chronic kidney disease) stage 3, GFR 30-59 ml/min 06/22/2014  . Spondylolisthesis of lumbar region 08/07/2013  . Hyperlipidemia 06/13/2008  . ATRIAL FIBRILLATION 06/13/2008  . Essential hypertension 11/05/2006  . Coronary atherosclerosis 11/05/2006   Past Medical History:  Diagnosis Date  . Arthritis   . CAD (coronary artery disease)  Mild per remote cath in 2004; normal nuclear in 2012  . Chronic kidney disease    stage3 kidney disease   . Complication of anesthesia    slow to wake up  . Diastolic dysfunction    Grade I, per echo in 2012  . Dysrhythmia   . GERD (gastroesophageal reflux disease)    prevacid  . H/O hiatal hernia   . Headache(784.0)    sinus  . High risk medication use    Flecainide therapy  . History of  heartburn   . Hyperlipidemia   . Hypertension   . LVH (left ventricular hypertrophy)    Per echo in July 2012  . Normal nuclear stress test July 2012  . Obesity   . PAF (paroxysmal atrial fibrillation) (HCC)   . Pneumonia    20 yrs ago  . Syncope July 2012   Family History  Problem Relation Age of Onset  . Lung cancer Father    Past Surgical History:  Procedure Laterality Date  . APPENDECTOMY  1952  . BACK SURGERY  1980, 2010   x2  . CARDIAC CATHETERIZATION  07/28/2002   EF GREATER THAN 55%; MILD CAD  . COLONOSCOPY    . DILATION AND CURETTAGE OF UTERUS    . LUMBAR LAMINECTOMY/DECOMPRESSION MICRODISCECTOMY Right 02/15/2015   Procedure: Right Thoracic twelve-Lumbar one  Microdiskectomy;  Surgeon: Tressie Stalker, MD;  Location: [facility];  Service: Neurosurgery;  Laterality: Right;  Thoracic/Lumbar  . TONSILLECTOMY     age 63 yrs  . TUBAL LIGATION     Social History   Occupational History  . Not on file.   Social History Main Topics  . Smoking status: Never Smoker  . Smokeless tobacco: Never Used  . Alcohol use No  . Drug use: No  . Sexual activity: Not on file

## 2016-06-07 MED ORDER — BUPIVACAINE HCL 0.5 % IJ SOLN
2.0000 mL | INTRAMUSCULAR | Status: AC | PRN
  Administered 2016-06-06: 2 mL via INTRA_ARTICULAR

## 2016-06-07 MED ORDER — METHYLPREDNISOLONE ACETATE 80 MG/ML IJ SUSP
80.0000 mg | INTRAMUSCULAR | Status: AC | PRN
  Administered 2016-06-06: 80 mg via INTRA_ARTICULAR

## 2016-12-13 ENCOUNTER — Telehealth (INDEPENDENT_AMBULATORY_CARE_PROVIDER_SITE_OTHER): Payer: Self-pay | Admitting: Physical Medicine and Rehabilitation

## 2016-12-13 NOTE — Telephone Encounter (Signed)
Left message for patient to call back to schedule.  °

## 2016-12-13 NOTE — Telephone Encounter (Signed)
Likely bilateral TF esi, level TBD

## 2016-12-15 NOTE — Telephone Encounter (Signed)
Scheduled for 11/19 at 1445

## 2016-12-25 ENCOUNTER — Encounter (INDEPENDENT_AMBULATORY_CARE_PROVIDER_SITE_OTHER): Payer: Self-pay | Admitting: Physical Medicine and Rehabilitation

## 2016-12-25 ENCOUNTER — Ambulatory Visit (INDEPENDENT_AMBULATORY_CARE_PROVIDER_SITE_OTHER): Payer: Medicare Other

## 2016-12-25 ENCOUNTER — Ambulatory Visit (INDEPENDENT_AMBULATORY_CARE_PROVIDER_SITE_OTHER): Payer: Medicare Other | Admitting: Physical Medicine and Rehabilitation

## 2016-12-25 VITALS — BP 191/100 | HR 60 | Temp 97.2°F

## 2016-12-25 DIAGNOSIS — M961 Postlaminectomy syndrome, not elsewhere classified: Secondary | ICD-10-CM | POA: Diagnosis not present

## 2016-12-25 DIAGNOSIS — M5416 Radiculopathy, lumbar region: Secondary | ICD-10-CM | POA: Diagnosis not present

## 2016-12-25 MED ORDER — LIDOCAINE HCL (PF) 1 % IJ SOLN
2.0000 mL | Freq: Once | INTRAMUSCULAR | Status: AC
Start: 2016-12-25 — End: 2016-12-25
  Administered 2016-12-25: 2 mL

## 2016-12-25 MED ORDER — BETAMETHASONE SOD PHOS & ACET 6 (3-3) MG/ML IJ SUSP
12.0000 mg | Freq: Once | INTRAMUSCULAR | Status: AC
  Administered 2016-12-25: 12 mg

## 2016-12-25 NOTE — Progress Notes (Deleted)
Lower back pain, left side, radiating pain and numbness. Most pain when standing.  Previous injections have been helpful. Increasing pain 2 months.  No contrast allergy No blood thinner. Has driver.

## 2016-12-25 NOTE — Patient Instructions (Signed)

## 2016-12-26 NOTE — Procedures (Signed)
Cheryl [name] is a pleasant 82 year old female that we have known quite well.  She has had several back surgeries by Dr. Lovell SheehanJenkins including a lumbar fusion.  We have completed sacroiliac joint injection on the right on 2 occasions many months ago with really good relief of her right-sided low back pain.  She reports still having that pain but is much less severe.  The reason she came in this time was left radicular pain in a classic S1 distribution.  This is been an ongoing problem as well but it flares up from time to time.  We are going to complete a left S1 transforaminal epidural injections diagnostically and hopefully therapeutically.  Depending on relief would look at sacroiliac joint.  The injection  will be diagnostic and hopefully therapeutic. The patient has failed conservative care including time, medications and activity modification.  S1 Lumbosacral Transforaminal Epidural Steroid Injection - Sub-Pedicular Approach with Fluoroscopic Guidance   Patient: Cheryl [name]      Date of Birth: 12-02-1934 MRN: [id number] PCP: Laurann MontanaWhite, Cynthia, MD      Visit Date: 12/25/2016   Universal Protocol:    Date/Time: 11/20/185:23 AM  Consent Given By: the patient  Position:  PRONE  Additional Comments: Vital signs were monitored before and after the procedure. Patient was prepped and draped in the usual sterile fashion. The correct patient, procedure, and site was verified.   Injection Procedure Details:  Procedure Site One Meds Administered:  Meds ordered this encounter  Medications  . lidocaine (PF) (XYLOCAINE) 1 % injection 2 mL  . betamethasone acetate-betamethasone sodium phosphate (CELESTONE) injection 12 mg    Laterality: Left  Location/Site:  S1 Foramen   Needle size: 22 G  Needle type: Spinal  Needle Placement: Transforaminal  Findings:  -Contrast Used: 0.5 mL iohexol 180 mg iodine/mL   -Comments: Excellent flow of contrast along the nerve and into the epidural  space.  Procedure Details: After squaring off the sacral end-plate to get a true AP view, the C-arm was positioned so that the best possible view of the S1 foramen was visualized. The soft tissues overlying this structure were infiltrated with 2-3 ml. of 1% Lidocaine without Epinephrine.    The spinal needle was inserted toward the target using a "trajectory" view along the fluoroscope beam.  Under AP and lateral visualization, the needle was advanced so it did not puncture dura. Biplanar projections were used to confirm position. Aspiration was confirmed to be negative for CSF and/or blood. A 1-2 ml. volume of Isovue-250 was injected and flow of contrast was noted at each level. Radiographs were obtained for documentation purposes.   After attaining the desired flow of contrast documented above, a 0.5 to 1.0 ml test dose of 0.25% Marcaine was injected into each respective transforaminal space.  The patient was observed for 90 seconds post injection.  After no sensory deficits were reported, and normal lower extremity motor function was noted,   the above injectate was administered so that equal amounts of the injectate were placed at each foramen (level) into the transforaminal epidural space.   Additional Comments:  The patient tolerated the procedure well Dressing: Band-Aid    Post-procedure details: Patient was observed during the procedure. Post-procedure instructions were reviewed.  Patient left the clinic in stable condition.

## 2017-02-08 ENCOUNTER — Telehealth (INDEPENDENT_AMBULATORY_CARE_PROVIDER_SITE_OTHER): Payer: Self-pay | Admitting: Physical Medicine and Rehabilitation

## 2017-02-09 NOTE — Telephone Encounter (Signed)
Best to have her OV and we might have to look at hip etc.

## 2017-02-09 NOTE — Telephone Encounter (Signed)
Either S1 or possibly SI (S-eye) joint would be ok

## 2017-02-09 NOTE — Telephone Encounter (Signed)
Scheduled for an OV. 

## 2017-02-09 NOTE — Telephone Encounter (Signed)
I called patient to schedule, and she said the pain is different. She said it is on the other side. She said it is left now, and I told her that is the side we did the injection on last. She said it is much worse and that it doesn't go down the side of the leg anymore but "grabs" at her upper anterior thigh. Do you still want me to schedule an injection or an OV?

## 2017-02-22 ENCOUNTER — Ambulatory Visit (INDEPENDENT_AMBULATORY_CARE_PROVIDER_SITE_OTHER): Payer: Medicare Other

## 2017-02-22 ENCOUNTER — Encounter (INDEPENDENT_AMBULATORY_CARE_PROVIDER_SITE_OTHER): Payer: Self-pay | Admitting: Physical Medicine and Rehabilitation

## 2017-02-22 ENCOUNTER — Ambulatory Visit (INDEPENDENT_AMBULATORY_CARE_PROVIDER_SITE_OTHER): Payer: Medicare Other | Admitting: Physical Medicine and Rehabilitation

## 2017-02-22 VITALS — BP 158/77 | HR 58 | Temp 97.5°F

## 2017-02-22 DIAGNOSIS — M545 Low back pain, unspecified: Secondary | ICD-10-CM

## 2017-02-22 DIAGNOSIS — M1612 Unilateral primary osteoarthritis, left hip: Secondary | ICD-10-CM | POA: Diagnosis not present

## 2017-02-22 DIAGNOSIS — M419 Scoliosis, unspecified: Secondary | ICD-10-CM

## 2017-02-22 DIAGNOSIS — G8929 Other chronic pain: Secondary | ICD-10-CM | POA: Diagnosis not present

## 2017-02-22 DIAGNOSIS — M961 Postlaminectomy syndrome, not elsewhere classified: Secondary | ICD-10-CM

## 2017-02-22 DIAGNOSIS — M25552 Pain in left hip: Secondary | ICD-10-CM | POA: Diagnosis not present

## 2017-02-22 NOTE — Progress Notes (Deleted)
Pt states sharp pain in lower back with extreme pain in left hip and left thigh. Pt states pain in left hip and left thigh has been going on for about 2 weeks. Pt states walking and standing makes pain worse and ibuprofen takes the edge off. Pt states last injection on right hip helped out a lot and now there is pain in her left hip.

## 2017-02-22 NOTE — Progress Notes (Addendum)
Cheryl [name] - 83 y.o. female MRN [id number]  Date of birth: [birth year]  Office Visit Note: Visit Date: 02/22/2017 PCP: Laurann Montana, MD Referred by: Laurann Montana, MD  Subjective: Chief Complaint  Patient presents with  . Lower Back - Pain  . Left Hip - Pain  . Left Thigh - Pain   HPI: Cheryl [name] is an 83 year old female difficult history of chronic low back and hip pain which can be left and right at different times.  She has had prior lumbar fusion at L3-4 and L4-5 in the past on x-rays difficult to see but the fusion really does just extend down to L4.  She does not have a fusion to the sacrum he likely has a positional type L5 segment which is very deep-seated in the pelvis.  Looking back at the operative notes by Dr. Lovell Sheehan which was in 2015 this was indeed an L3-4 and L4-5 fusion.  She is also had microdiscectomy at T12 that occurred in 2017.  She has not had any updated MRI has been we have seen x-rays on various occasions.  She has done well with a right sacroiliac joint injection her right side is still doing better.  She comes in today with worsening severe left hip pain with a catching sensation.  Last injection was in November was an S1 transforaminal injection that she does feel like helped but she feels like this is a different type of pain.  She reports severe pain for 2 weeks but she has had pain for several weeks before that with no particular injury or falls.  She reports that walking and standing is taking ibuprofen.  She reports having to use a cane and she is wearing a lumbar support.  She denies any radicular complaints past the knee and no paresthesias.  She said no focal weakness or bowel or bladder difficulty.  She has had no specific night pain.  No specific groin pain but the pain does refer anteriorly into the thigh and laterally.  She does not have any diagnosis of prior hip arthritis or placements.  We will go ahead and get x-rays today of her pelvis and left  hip.    Review of Systems  Constitutional: Negative for chills, fever, malaise/fatigue and weight loss.  HENT: Negative for hearing loss and sinus pain.   Eyes: Negative for blurred vision, double vision and photophobia.  Respiratory: Negative for cough and shortness of breath.   Cardiovascular: Negative for chest pain, palpitations and leg swelling.  Gastrointestinal: Negative for abdominal pain, nausea and vomiting.  Genitourinary: Negative for flank pain.  Musculoskeletal: Positive for back pain and joint pain. Negative for myalgias.  Skin: Negative for itching and rash.  Neurological: Negative for tremors, focal weakness and weakness.  Endo/Heme/Allergies: Negative.   Psychiatric/Behavioral: Negative for depression.  All other systems reviewed and are negative.  Otherwise per HPI.  Assessment & Plan: Visit Diagnoses:  1. Pain in left hip   2. Chronic left-sided low back pain without sciatica   3. Post laminectomy syndrome   4. Scoliosis of lumbar spine, unspecified scoliosis type   5. Unilateral primary osteoarthritis, left hip     Plan: Findings:  Difficult chronic back pain patient with prior lumbar fusion at L3-4 and L4-5 and prior discectomy at T12-L1.  X-rays of the pelvis today do reveal moderate arthritis on the left with periarticular osteophytes.  On exam she does have some pain with extension and external more than internal rotation of the  left hip.  This does reproduce some of her pain however.  We are going today to complete a diagnostic and hopefully therapeutic intra-articular hip joint injection with fluoroscopic guidance.  This should tell us during the anesthetic phase this is going to be diagnostic or not.  I explained this to her at length.  If it does not seem to help during the anesthetic phase and obviously we would look at potential facet joint block or pleural injection in a week or 2.  I would also entertain the idea of updating her MRI of the lumbar spine.   We have completed several injections over the last year and they have all been beneficial for 3-4 months.  Tries to at least look at an MRI of her lumbar spine persist.  She has no other red flag complaints other than just increased pain.  I would not change any medications at this point.  As noted below on the injection itself the patient did have significant relief during the anesthetic phase.  She was able to rise from a seated position very easily compared to when she came in the office and she was ambulating better.    Meds & Orders: No orders of the defined types were placed in this encounter.   Orders Placed This Encounter  Procedures  . Large Joint Inj: L glenohumeral  . XR HIP UNILAT W OR W/O PELVIS 1V LEFT  . XR C-ARM NO REPORT    Follow-up: Return if symptoms worsen or fail to improve.   Procedures: Large Joint Inj: L hip joint on 02/22/2017 10:31 AM Indications: pain and diagnostic evaluation Details: 22 G 3.5 in needle, fluoroscopy-guided anterior approach  Arthrogram: No  Medications: 3 mL bupivacaine 0.5 %; 80 mg triamcinolone acetonide 40 MG/ML Outcome: tolerated well, no immediate complications  There was excellent flow of contrast producing a partial arthrogram of the glenohumeral joint. The patient did have relief of symptoms during the anesthetic phase of the injection. Procedure, treatment alternatives, risks and benefits explained, specific risks discussed. Consent was given by the patient. Immediately prior to procedure a time out was called to verify the correct patient, procedure, equipment, support staff and site/side marked as required. Patient was prepped and draped in the usual sterile fashion.      No notes on file   Clinical History: No specialty comments available.  She reports that  has never smoked. she has never used smokeless tobacco. No results for input(s): HGBA1C, LABURIC in the last 8760 hours.  Objective:  VS:  HT:    WT:   BMI:     BP:(!)  158/77  HR:(!) 58bpm  TEMP:(!) 97.5 F (36.4 C)(Oral)  RESP:99 % Physical Exam  Constitutional: She is oriented to person, place, and time. She appears well-developed and well-nourished.  Eyes: Conjunctivae and EOM are normal. Pupils are equal, round, and reactive to light.  Cardiovascular: Normal rate and intact distal pulses.  Pulmonary/Chest: Effort normal.  Musculoskeletal:  Patient ambulates with antalgic gait to the left while using a cane.  She is very slow to arise from a seated position.  She has good distal strength without clonus.  She has pain produced with flexion and external rotation of the left hip more than internal rotation.  Neurological: She is alert and oriented to person, place, and time. She exhibits normal muscle tone. Coordination normal.  Skin: Skin is warm and dry. No rash noted. No erythema.  Psychiatric: Her behavior is normal.  Depressed mood and affect  Nursing note and vitals reviewed.   Ortho Exam Imaging: Xr C-arm No Report  Result Date: 02/22/2017 Please see Notes or Procedures tab for imaging impression.  Xr Hip Unilat W Or W/o Pelvis 1v Left  Result Date: 02/23/2017 AP of pelvis and left hip x-ray shows prior lumbar instrumented fusion at L3-4 and L4-5 with some lumbar scoliosis mild and slightly lower left ilium height compared to right.  The sacroiliac joints do not show a great deal of sclerosis.  Both the right and left hip show some periarticular osteophytes but is clearly more left than right.  Appears to be at least moderate arthritis on the left.   Past Medical/Family/Surgical/Social History: Medications & Allergies reviewed per EMR Patient Active Problem List   Diagnosis Date Noted  . Lumbar herniated disc 02/15/2015  . CKD (chronic kidney disease) stage 3, GFR 30-59 ml/min (HCC) 06/22/2014  . Spondylolisthesis of lumbar region 08/07/2013  . Hyperlipidemia 06/13/2008  . ATRIAL FIBRILLATION 06/13/2008  . Essential hypertension  11/05/2006  . Coronary atherosclerosis 11/05/2006   Past Medical History:  Diagnosis Date  . Arthritis   . CAD (coronary artery disease)    Mild per remote cath in 2004; normal nuclear in 2012  . Chronic kidney disease    stage3 kidney disease   . Complication of anesthesia    slow to wake up  . Diastolic dysfunction    Grade I, per echo in 2012  . Dysrhythmia   . GERD (gastroesophageal reflux disease)    prevacid  . H/O hiatal hernia   . Headache(784.0)    sinus  . High risk medication use    Flecainide therapy  . History of heartburn   . Hyperlipidemia   . Hypertension   . LVH (left ventricular hypertrophy)    Per echo in July 2012  . Normal nuclear stress test July 2012  . Obesity   . PAF (paroxysmal atrial fibrillation) (HCC)   . Pneumonia    20 yrs ago  . Syncope July 2012   Family History  Problem Relation Age of Onset  . Lung cancer Father    Past Surgical History:  Procedure Laterality Date  . APPENDECTOMY  1952  . BACK SURGERY  1980, 2010   x2  . CARDIAC CATHETERIZATION  07/28/2002   EF GREATER THAN 55%; MILD CAD  . COLONOSCOPY    . DILATION AND CURETTAGE OF UTERUS    . LUMBAR LAMINECTOMY/DECOMPRESSION MICRODISCECTOMY Right 02/15/2015   Procedure: Right Thoracic twelve-Lumbar one  Microdiskectomy;  Surgeon: Tressie Stalker, MD;  Location: [facility];  Service: Neurosurgery;  Laterality: Right;  Thoracic/Lumbar  . TONSILLECTOMY     age 41 yrs  . TUBAL LIGATION     Social History   Occupational History  . Not on file  Tobacco Use  . Smoking status: Never Smoker  . Smokeless tobacco: Never Used  Substance and Sexual Activity  . Alcohol use: No  . Drug use: No  . Sexual activity: Not on file

## 2017-02-23 ENCOUNTER — Encounter (INDEPENDENT_AMBULATORY_CARE_PROVIDER_SITE_OTHER): Payer: Self-pay | Admitting: Physical Medicine and Rehabilitation

## 2017-02-23 ENCOUNTER — Inpatient Hospital Stay (HOSPITAL_COMMUNITY)
Admission: EM | Admit: 2017-02-23 | Discharge: 2017-02-26 | DRG: 641 | Disposition: A | Discharge status: Home / Self Care | Payer: Medicare Other | Attending: Internal Medicine | Admitting: Internal Medicine

## 2017-02-23 ENCOUNTER — Encounter (HOSPITAL_COMMUNITY): Payer: Self-pay | Admitting: Family Medicine

## 2017-02-23 ENCOUNTER — Emergency Department (HOSPITAL_COMMUNITY): Payer: Medicare Other

## 2017-02-23 DIAGNOSIS — E785 Hyperlipidemia, unspecified: Secondary | ICD-10-CM | POA: Diagnosis present

## 2017-02-23 DIAGNOSIS — N183 Chronic kidney disease, stage 3 (moderate): Secondary | ICD-10-CM | POA: Diagnosis present

## 2017-02-23 DIAGNOSIS — Z981 Arthrodesis status: Secondary | ICD-10-CM

## 2017-02-23 DIAGNOSIS — Z885 Allergy status to narcotic agent status: Secondary | ICD-10-CM | POA: Diagnosis not present

## 2017-02-23 DIAGNOSIS — Z7982 Long term (current) use of aspirin: Secondary | ICD-10-CM

## 2017-02-23 DIAGNOSIS — I2510 Atherosclerotic heart disease of native coronary artery without angina pectoris: Secondary | ICD-10-CM | POA: Diagnosis present

## 2017-02-23 DIAGNOSIS — K449 Diaphragmatic hernia without obstruction or gangrene: Secondary | ICD-10-CM | POA: Diagnosis present

## 2017-02-23 DIAGNOSIS — Z79899 Other long term (current) drug therapy: Secondary | ICD-10-CM

## 2017-02-23 DIAGNOSIS — G43909 Migraine, unspecified, not intractable, without status migrainosus: Secondary | ICD-10-CM | POA: Diagnosis present

## 2017-02-23 DIAGNOSIS — I480 Paroxysmal atrial fibrillation: Secondary | ICD-10-CM | POA: Diagnosis present

## 2017-02-23 DIAGNOSIS — K219 Gastro-esophageal reflux disease without esophagitis: Secondary | ICD-10-CM | POA: Diagnosis present

## 2017-02-23 DIAGNOSIS — E871 Hypo-osmolality and hyponatremia: Secondary | ICD-10-CM | POA: Diagnosis present

## 2017-02-23 DIAGNOSIS — I129 Hypertensive chronic kidney disease with stage 1 through stage 4 chronic kidney disease, or unspecified chronic kidney disease: Secondary | ICD-10-CM | POA: Diagnosis present

## 2017-02-23 DIAGNOSIS — R519 Headache, unspecified: Secondary | ICD-10-CM | POA: Diagnosis present

## 2017-02-23 DIAGNOSIS — Z882 Allergy status to sulfonamides status: Secondary | ICD-10-CM | POA: Diagnosis not present

## 2017-02-23 DIAGNOSIS — R112 Nausea with vomiting, unspecified: Secondary | ICD-10-CM | POA: Diagnosis not present

## 2017-02-23 DIAGNOSIS — I160 Hypertensive urgency: Secondary | ICD-10-CM | POA: Diagnosis present

## 2017-02-23 DIAGNOSIS — I10 Essential (primary) hypertension: Secondary | ICD-10-CM | POA: Diagnosis not present

## 2017-02-23 DIAGNOSIS — E861 Hypovolemia: Secondary | ICD-10-CM | POA: Diagnosis present

## 2017-02-23 DIAGNOSIS — E860 Dehydration: Secondary | ICD-10-CM | POA: Diagnosis present

## 2017-02-23 DIAGNOSIS — R51 Headache: Secondary | ICD-10-CM | POA: Diagnosis not present

## 2017-02-23 DIAGNOSIS — Z888 Allergy status to other drugs, medicaments and biological substances status: Secondary | ICD-10-CM

## 2017-02-23 LAB — BASIC METABOLIC PANEL
ANION GAP: 15 (ref 5–15)
BUN: 27 mg/dL — AB (ref 6–20)
CALCIUM: 10.1 mg/dL (ref 8.9–10.3)
CO2: 21 mmol/L — ABNORMAL LOW (ref 22–32)
Chloride: 89 mmol/L — ABNORMAL LOW (ref 101–111)
Creatinine, Ser: 1.03 mg/dL — ABNORMAL HIGH (ref 0.44–1.00)
GFR calc Af Amer: 58 mL/min — ABNORMAL LOW (ref >60)
GFR, EST NON AFRICAN AMERICAN: 50 mL/min — AB (ref >60)
Glucose, Bld: 147 mg/dL — ABNORMAL HIGH (ref 65–99)
Potassium: 4.3 mmol/L (ref 3.5–5.1)
SODIUM: 125 mmol/L — AB (ref 135–145)

## 2017-02-23 LAB — I-STAT CHEM 8, ED
BUN: 32 mg/dL — AB (ref 6–20)
CALCIUM ION: 1.17 mmol/L (ref 1.15–1.40)
CREATININE: 0.9 mg/dL (ref 0.44–1.00)
Chloride: 91 mmol/L — ABNORMAL LOW (ref 101–111)
GLUCOSE: 149 mg/dL — AB (ref 65–99)
HCT: 42 % (ref 36.0–46.0)
HEMOGLOBIN: 14.3 g/dL (ref 12.0–15.0)
Potassium: 4.3 mmol/L (ref 3.5–5.1)
Sodium: 122 mmol/L — ABNORMAL LOW (ref 135–145)
TCO2: 25 mmol/L (ref 22–32)

## 2017-02-23 LAB — PROTEIN, CSF: Total  Protein, CSF: 30 mg/dL (ref 15–45)

## 2017-02-23 LAB — CBC WITH DIFFERENTIAL/PLATELET
Basophils Absolute: 0 10*3/uL (ref 0.0–0.1)
Basophils Relative: 0 %
Eosinophils Absolute: 0 10*3/uL (ref 0.0–0.7)
Eosinophils Relative: 0 %
HCT: 39.4 % (ref 36.0–46.0)
Hemoglobin: 14.1 g/dL (ref 12.0–15.0)
Lymphocytes Relative: 7 %
Lymphs Abs: 0.6 10*3/uL — ABNORMAL LOW (ref 0.7–4.0)
MCH: 32.9 pg (ref 26.0–34.0)
MCHC: 35.8 g/dL (ref 30.0–36.0)
MCV: 91.8 fL (ref 78.0–100.0)
Monocytes Absolute: 0.3 10*3/uL (ref 0.1–1.0)
Monocytes Relative: 3 %
Neutro Abs: 8.2 10*3/uL — ABNORMAL HIGH (ref 1.7–7.7)
Neutrophils Relative %: 90 %
Platelets: ADEQUATE 10*3/uL (ref 150–400)
RBC: 4.29 MIL/uL (ref 3.87–5.11)
RDW: 11.9 % (ref 11.5–15.5)
WBC: 9.1 10*3/uL (ref 4.0–10.5)

## 2017-02-23 LAB — GLUCOSE, CSF: GLUCOSE CSF: 90 mg/dL — AB (ref 40–70)

## 2017-02-23 MED ORDER — SODIUM CHLORIDE 0.9 % IV BOLUS (SEPSIS)
1000.0000 mL | Freq: Once | INTRAVENOUS | Status: AC
  Administered 2017-02-23: 1000 mL via INTRAVENOUS

## 2017-02-23 MED ORDER — DIPHENHYDRAMINE HCL 50 MG/ML IJ SOLN
12.5000 mg | Freq: Once | INTRAMUSCULAR | Status: AC
  Administered 2017-02-23: 12.5 mg via INTRAVENOUS
  Filled 2017-02-23: qty 1

## 2017-02-23 MED ORDER — PROCHLORPERAZINE EDISYLATE 5 MG/ML IJ SOLN
5.0000 mg | Freq: Once | INTRAMUSCULAR | Status: AC
  Administered 2017-02-23: 5 mg via INTRAVENOUS
  Filled 2017-02-23: qty 2

## 2017-02-23 MED ORDER — TRIAMCINOLONE ACETONIDE 40 MG/ML IJ SUSP
80.0000 mg | INTRAMUSCULAR | Status: AC | PRN
  Administered 2017-02-22: 80 mg via INTRA_ARTICULAR

## 2017-02-23 MED ORDER — LIDOCAINE-EPINEPHRINE 1 %-1:100000 IJ SOLN
10.0000 mL | Freq: Once | INTRAMUSCULAR | Status: DC
  Filled 2017-02-23: qty 10

## 2017-02-23 MED ORDER — ONDANSETRON HCL 4 MG/2ML IJ SOLN
4.0000 mg | Freq: Once | INTRAMUSCULAR | Status: AC
  Administered 2017-02-23: 4 mg via INTRAVENOUS
  Filled 2017-02-23: qty 2

## 2017-02-23 MED ORDER — BUPIVACAINE HCL 0.5 % IJ SOLN
3.0000 mL | INTRAMUSCULAR | Status: AC | PRN
  Administered 2017-02-22: 3 mL via INTRA_ARTICULAR

## 2017-02-23 MED ORDER — SODIUM CHLORIDE 0.9 % IV SOLN
Freq: Once | INTRAVENOUS | Status: AC
  Administered 2017-02-23: 23:00:00 via INTRAVENOUS

## 2017-02-23 MED ORDER — LIDOCAINE-EPINEPHRINE (PF) 2 %-1:200000 IJ SOLN
10.0000 mL | Freq: Once | INTRAMUSCULAR | Status: AC
  Administered 2017-02-23: 10 mL via INTRADERMAL

## 2017-02-23 NOTE — ED Provider Notes (Signed)
[facility] EMERGENCY DEPARTMENT Provider Note   CSN: [id number] Arrival date & time: 02/23/17  2042     History   Chief Complaint Chief Complaint  Patient presents with  . Nausea  . Headache    HPI Cheryl [name] is a 83 y.o. female.  HPI  The pt is an 83 y/o female - she has a history of headaches when she was much younger but has not had any significant headaches in decades - she reports that she had a mild headache last night when she went to bed - not noticeable - but awoke at 2:30 in the morning with a severe headache which has been persistent, severe and associated with nausea and vomiting.  She has mild posterior headache as well as some neck stiffness as well - no fevers.  She reports severe aching across the frontal forehead - denies numbness, weakness, changes in vision - when she tries to walk she develops severe nausea - she has had nothing to eat today.  She has no fevers.    No head injuries No anticoagulants but takes ASA She is on metoprolol and flecainide for A fib  She has had severe hypertension since arrival but states she has been compliant with her home meds - she did try to take ibuprofen today - but didn't give any relief.  She was given some nausea medicines prehospital today but didn't help either.  She did have a steroid injection in the hip yesterday at orthopedics.  Past Medical History:  Diagnosis Date  . Arthritis   . CAD (coronary artery disease)    Mild per remote cath in 2004; normal nuclear in 2012  . Chronic kidney disease    stage3 kidney disease   . Complication of anesthesia    slow to wake up  . Diastolic dysfunction    Grade I, per echo in 2012  . Dysrhythmia   . GERD (gastroesophageal reflux disease)    prevacid  . H/O hiatal hernia   . Headache(784.0)    sinus  . High risk medication use    Flecainide therapy  . History of heartburn   . Hyperlipidemia   . Hypertension   . LVH (left ventricular  hypertrophy)    Per echo in July 2012  . Normal nuclear stress test July 2012  . Obesity   . PAF (paroxysmal atrial fibrillation) (HCC)   . Pneumonia    20 yrs ago  . Syncope July 2012    Patient Active Problem List   Diagnosis Date Noted  . Lumbar herniated disc 02/15/2015  . CKD (chronic kidney disease) stage 3, GFR 30-59 ml/min (HCC) 06/22/2014  . Spondylolisthesis of lumbar region 08/07/2013  . Hyperlipidemia 06/13/2008  . ATRIAL FIBRILLATION 06/13/2008  . Essential hypertension 11/05/2006  . Coronary atherosclerosis 11/05/2006    Past Surgical History:  Procedure Laterality Date  . APPENDECTOMY  1952  . BACK SURGERY  1980, 2010   x2  . CARDIAC CATHETERIZATION  07/28/2002   EF GREATER THAN 55%; MILD CAD  . COLONOSCOPY    . DILATION AND CURETTAGE OF UTERUS    . LUMBAR LAMINECTOMY/DECOMPRESSION MICRODISCECTOMY Right 02/15/2015   Procedure: Right Thoracic twelve-Lumbar one  Microdiskectomy;  Surgeon: Tressie Stalker, MD;  Location: [facility];  Service: Neurosurgery;  Laterality: Right;  Thoracic/Lumbar  . TONSILLECTOMY     age 55 yrs  . TUBAL LIGATION      OB History    No data available  Home Medications    Prior to Admission medications   Medication Sig Start Date End Date Taking? Authorizing Provider  aspirin EC 81 MG tablet Take 81 mg by mouth daily.    [provider]  Biotin 10 MG CAPS Take 10 mg by mouth daily.    [provider]  Cholecalciferol (VITAMIN D) 2000 UNITS tablet Take 2,000 Units by mouth daily.      [provider]  docusate sodium (COLACE) 100 MG capsule Take 1 capsule (100 mg total) by mouth 2 (two) times daily. 02/16/15   Tressie StalkerJenkins, Jeffrey, MD  flecainide (TAMBOCOR) 50 MG tablet Take 1 tablet (50 mg total) by mouth daily. 02/17/16   Jake BatheSkains, Mark C, MD  loratadine (CLARITIN) 10 MG tablet Take 10 mg by mouth daily as needed for allergies.    [provider]  metoprolol succinate (TOPROL-XL) 25 MG 24 hr  tablet Take 25 mg by mouth daily. 05/28/14   [provider]  traZODone (DESYREL) 100 MG tablet Take 100 mg by mouth at bedtime.      [provider]  triamterene-hydrochlorothiazide (DYAZIDE) 37.5-25 MG capsule Take 1 capsule by mouth every morning. 01/10/17   [provider]    Family History Family History  Problem Relation Age of Onset  . Lung cancer Father     Social History Social History   Tobacco Use  . Smoking status: Never Smoker  . Smokeless tobacco: Never Used  Substance Use Topics  . Alcohol use: No  . Drug use: No     Allergies   Amlodipine besylate; Codeine phosphate; Lipitor [atorvastatin calcium]; and Sulfonamide derivatives   Review of Systems Review of Systems  All other systems reviewed and are negative.    Physical Exam Updated Vital Signs BP (!) 203/98   Pulse 80   Temp 98.4 F (36.9 C) (Oral)   Resp 18   SpO2 99%   Physical Exam  Constitutional: She appears well-developed and well-nourished. No distress.  HENT:  Head: Normocephalic and atraumatic.  Mouth/Throat: Oropharynx is clear and moist. No oropharyngeal exudate.  Eyes: Conjunctivae and EOM are normal. Pupils are equal, round, and reactive to light. Right eye exhibits no discharge. Left eye exhibits no discharge. No scleral icterus.  Neck: Normal range of motion. Neck supple. No JVD present. No thyromegaly present.  Cardiovascular: Normal rate, regular rhythm, normal heart sounds and intact distal pulses. Exam reveals no gallop and no friction rub.  No murmur heard. Pulmonary/Chest: Effort normal and breath sounds normal. No respiratory distress. She has no wheezes. She has no rales.  Abdominal: Soft. Bowel sounds are normal. She exhibits no distension and no mass. There is no tenderness.  Musculoskeletal: Normal range of motion. She exhibits no edema or tenderness.  Lymphadenopathy:    She has no cervical adenopathy.  Neurological: She is alert. Coordination  normal.  Skin: Skin is warm and dry. No rash noted. No erythema.  Psychiatric: She has a normal mood and affect. Her behavior is normal.  Nursing note and vitals reviewed.    ED Treatments / Results  Labs (all labs ordered are listed, but only abnormal results are displayed) Labs Reviewed  CBC WITH DIFFERENTIAL/PLATELET - Abnormal; Notable for the following components:      Result Value   Neutro Abs 8.2 (*)    Lymphs Abs 0.6 (*)    All other components within normal limits  I-STAT CHEM 8, ED - Abnormal; Notable for the following components:   Sodium 122 (*)  Chloride 91 (*)    BUN 32 (*)    Glucose, Bld 149 (*)    All other components within normal limits  CSF CULTURE  GRAM STAIN  CSF CELL COUNT WITH DIFFERENTIAL  CSF CELL COUNT WITH DIFFERENTIAL  GLUCOSE, CSF  PROTEIN, CSF  HERPES SIMPLEX VIRUS(HSV) DNA BY PCR  BASIC METABOLIC PANEL    EKG  EKG Interpretation None       Radiology Ct Head Wo Contrast  Result Date: 02/23/2017 CLINICAL DATA:  Headache with nausea EXAM: CT HEAD WITHOUT CONTRAST TECHNIQUE: Contiguous axial images were obtained from the base of the skull through the vertex without intravenous contrast. COMPARISON:  Brain MRI 03/04/2015, CT brain 01/28/2015 FINDINGS: Brain: No evidence of acute infarction, hemorrhage, hydrocephalus, extra-axial collection or mass lesion/mass effect. Vascular: No hyperdense vessels.  Carotid artery calcifications. Skull: Normal. Negative for fracture or focal lesion. Sinuses/Orbits: Mucosal thickening in the sphenoid, and maxillary sinus. No acute orbital abnormality. Other: None IMPRESSION: Negative.  No CT evidence for acute intracranial abnormality. Electronically Signed   By: Jasmine Pang M.D.   On: 02/23/2017 22:06   Xr C-arm No Report  Result Date: 02/22/2017 Please see Notes or Procedures tab for imaging impression.  Xr Hip Unilat W Or W/o Pelvis 1v Left  Result Date: 02/23/2017 AP of pelvis and left hip x-ray  shows prior lumbar instrumented fusion at L3-4 and L4-5 with some lumbar scoliosis mild and slightly lower left ilium height compared to right.  The sacroiliac joints do not show a great deal of sclerosis.  Both the right and left hip show some periarticular osteophytes but is clearly more left than right.  Appears to be at least moderate arthritis on the left.   Procedures .Lumbar Puncture Date/Time: 02/23/2017 10:45 PM Performed by: Eber Hong, MD Authorized by: Eber Hong, MD   Consent:    Consent obtained:  Written   Consent given by:  Patient (son)   Risks discussed:  Bleeding, infection, pain, nerve damage and headache   Alternatives discussed:  No treatment, delayed treatment and alternative treatment Pre-procedure details:    Procedure purpose:  Diagnostic   Preparation: Patient was prepped and draped in usual sterile fashion   Anesthesia (see MAR for exact dosages):    Anesthesia method:  Local infiltration   Local anesthetic:  Lidocaine 1% WITH epi Procedure details:    Lumbar space:  L3-L4 interspace   Patient position:  R lateral decubitus   Needle gauge:  22   Needle type:  Spinal needle - Quincke tip   Needle length (in):  3.5   Ultrasound guidance: no     Number of attempts:  1   Fluid appearance:  Clear   Tubes of fluid:  4   Total volume (ml):  5 Post-procedure:    Puncture site:  Adhesive bandage applied   Patient tolerance of procedure:  Tolerated well, no immediate complications Comments:          .Critical Care Performed by: Eber Hong, MD Authorized by: Eber Hong, MD   Critical care provider statement:    Critical care time (minutes):  35   Critical care time was exclusive of:  Separately billable procedures and treating other patients and teaching time   Critical care was necessary to treat or prevent imminent or life-threatening deterioration of the following conditions:  Metabolic crisis   Critical care was time spent personally by  me on the following activities:  Blood draw for specimens, development of treatment plan  with patient or surrogate, discussions with consultants, evaluation of patient's response to treatment, examination of patient, obtaining history from patient or surrogate, ordering and performing treatments and interventions, ordering and review of laboratory studies, ordering and review of radiographic studies, pulse oximetry, re-evaluation of patient's condition and review of old charts Comments:           (including critical care time)  Medications Ordered in ED Medications  lidocaine-EPINEPHrine (XYLOCAINE W/EPI) 1 %-1:100000 (with pres) injection 10 mL (10 mLs Other Not Given 02/23/17 2312)  0.9 %  sodium chloride infusion (not administered)  ondansetron (ZOFRAN) injection 4 mg (4 mg Intravenous Given 02/23/17 2121)  sodium chloride 0.9 % bolus 1,000 mL (0 mLs Intravenous Stopped 02/23/17 2242)  prochlorperazine (COMPAZINE) injection 5 mg (5 mg Intravenous Given 02/23/17 2119)  diphenhydrAMINE (BENADRYL) injection 12.5 mg (12.5 mg Intravenous Given 02/23/17 2120)  lidocaine-EPINEPHrine (XYLOCAINE W/EPI) 2 %-1:200000 (PF) injection 10 mL (10 mLs Intradermal Given 02/23/17 2313)     Initial Impression / Assessment and Plan / ED Course  I have reviewed the triage vital signs and the nursing notes.  Pertinent labs & imaging results that were available during my care of the patient were reviewed by me and considered in my medical decision making (see chart for details).     The pt appears ill with her headache and vomiting and severe hypertension - I feel that pt needs a CT scan to look for bleeding, meds to help with symptoms and if no significant improvement may need an LP to r/o infectious or inflammatory sources of the patient symptoms.    The patient has hyponatremia to 122, she has a CT scan which is negative for acute findings, she will need to be admitted for her hyponatremia, her spinal tap went  smoothly, there was no signs of blood or cloudiness or xanthochromia.  Will discuss with the hospitalist for admission.  She has some relief with pain medications given thus far.  She has not continued to vomit after her initial vomiting spell on arrival.  Blood pressure is better at this time  Spinal tap successful, blood pressure has improved albeit slightly, IV fluids for dehydration, pain medicines have helped slightly, discussed with hospitalist Dr. Antionette Char, who will admit.  The patient is critically ill with severe hyponatremia which will require admission.  Final Clinical Impressions(s) / ED Diagnoses   Final diagnoses:  Hyponatremia  Severe hypertension  Intractable headache, unspecified chronicity pattern, unspecified headache type  Nausea and vomiting, intractability of vomiting not specified, unspecified vomiting type    ED Discharge Orders    None       Eber Hong, MD 02/23/17 2316

## 2017-02-23 NOTE — ED Triage Notes (Signed)
Pt brought in by [facility] from home for constant NV since 0230 this morning. [facility] received a cortisone injection for hip pain. Pt states the headache and nausea began this morning and she has not had any relief. Given 4mg  zofran en route to hospital with no relief. Pt c/o light sensitivity and 10/10 head pain.

## 2017-02-23 NOTE — ED Notes (Signed)
Patient transported to CT 

## 2017-02-24 ENCOUNTER — Other Ambulatory Visit: Payer: Self-pay

## 2017-02-24 LAB — CBC WITH DIFFERENTIAL/PLATELET
Basophils Absolute: 0 10*3/uL (ref 0.0–0.1)
Basophils Relative: 0 %
EOS ABS: 0 10*3/uL (ref 0.0–0.7)
EOS PCT: 0 %
HCT: 37.3 % (ref 36.0–46.0)
Hemoglobin: 13.1 g/dL (ref 12.0–15.0)
LYMPHS ABS: 0.6 10*3/uL — AB (ref 0.7–4.0)
Lymphocytes Relative: 6 %
MCH: 32.1 pg (ref 26.0–34.0)
MCHC: 35.1 g/dL (ref 30.0–36.0)
MCV: 91.4 fL (ref 78.0–100.0)
MONO ABS: 0.4 10*3/uL (ref 0.1–1.0)
Monocytes Relative: 4 %
Neutro Abs: 10 10*3/uL — ABNORMAL HIGH (ref 1.7–7.7)
Neutrophils Relative %: 90 %
Platelets: 258 10*3/uL (ref 150–400)
RBC: 4.08 MIL/uL (ref 3.87–5.11)
RDW: 11.6 % (ref 11.5–15.5)
WBC: 11.1 10*3/uL — AB (ref 4.0–10.5)

## 2017-02-24 LAB — BASIC METABOLIC PANEL
Anion gap: 13 (ref 5–15)
Anion gap: 13 (ref 5–15)
Anion gap: 11 (ref 5–15)
BUN: 23 mg/dL — AB (ref 6–20)
BUN: 21 mg/dL — AB (ref 6–20)
BUN: 20 mg/dL (ref 6–20)
CALCIUM: 9.5 mg/dL (ref 8.9–10.3)
CALCIUM: 9 mg/dL (ref 8.9–10.3)
CHLORIDE: 93 mmol/L — AB (ref 101–111)
CO2: 20 mmol/L — ABNORMAL LOW (ref 22–32)
CO2: 19 mmol/L — ABNORMAL LOW (ref 22–32)
CO2: 18 mmol/L — ABNORMAL LOW (ref 22–32)
CREATININE: 0.97 mg/dL (ref 0.44–1.00)
Calcium: 9.5 mg/dL (ref 8.9–10.3)
Chloride: 92 mmol/L — ABNORMAL LOW (ref 101–111)
Chloride: 95 mmol/L — ABNORMAL LOW (ref 101–111)
Creatinine, Ser: 0.92 mg/dL (ref 0.44–1.00)
Creatinine, Ser: 0.89 mg/dL (ref 0.44–1.00)
GFR calc Af Amer: >60 mL/min (ref >60)
GFR calc Af Amer: >60 mL/min (ref >60)
GFR calc Af Amer: >60 mL/min (ref >60)
GFR calc non Af Amer: 54 mL/min — ABNORMAL LOW (ref >60)
GFR, EST NON AFRICAN AMERICAN: 57 mL/min — AB (ref >60)
GFR, EST NON AFRICAN AMERICAN: 60 mL/min — AB (ref >60)
GLUCOSE: 132 mg/dL — AB (ref 65–99)
GLUCOSE: 142 mg/dL — AB (ref 65–99)
Glucose, Bld: 144 mg/dL — ABNORMAL HIGH (ref 65–99)
Potassium: 3.8 mmol/L (ref 3.5–5.1)
Potassium: 4.7 mmol/L (ref 3.5–5.1)
Potassium: 3.9 mmol/L (ref 3.5–5.1)
SODIUM: 126 mmol/L — AB (ref 135–145)
Sodium: 124 mmol/L — ABNORMAL LOW (ref 135–145)
Sodium: 124 mmol/L — ABNORMAL LOW (ref 135–145)

## 2017-02-24 LAB — CSF CELL COUNT WITH DIFFERENTIAL
Eosinophils, CSF: NONE SEEN % (ref 0–1)
Eosinophils, CSF: NONE SEEN % (ref 0–1)
RBC COUNT CSF: 0 /mm3
RBC COUNT CSF: 36 /mm3 — AB
Tube #: 1
Tube #: 4
WBC CSF: 2 /mm3 (ref 0–5)
WBC CSF: 3 /mm3 (ref 0–5)

## 2017-02-24 LAB — MRSA PCR SCREENING: MRSA by PCR: NEGATIVE

## 2017-02-24 LAB — SODIUM, URINE, RANDOM: Sodium, Ur: 151 mmol/L

## 2017-02-24 LAB — OSMOLALITY, URINE: Osmolality, Ur: 512 mOsm/kg (ref 300–900)

## 2017-02-24 LAB — CBG MONITORING, ED: GLUCOSE-CAPILLARY: 145 mg/dL — AB (ref 65–99)

## 2017-02-24 MED ORDER — ACETAMINOPHEN 325 MG PO TABS
650.0000 mg | ORAL_TABLET | Freq: Four times a day (QID) | ORAL | Status: DC | PRN
  Filled 2017-02-24: qty 2

## 2017-02-24 MED ORDER — PROCHLORPERAZINE EDISYLATE 5 MG/ML IJ SOLN
10.0000 mg | Freq: Three times a day (TID) | INTRAMUSCULAR | Status: DC | PRN
  Administered 2017-02-24: 10 mg via INTRAVENOUS
  Filled 2017-02-24: qty 2

## 2017-02-24 MED ORDER — LORATADINE 10 MG PO TABS
10.0000 mg | ORAL_TABLET | Freq: Every day | ORAL | Status: DC | PRN
  Administered 2017-02-26: 10 mg via ORAL
  Filled 2017-02-24: qty 1

## 2017-02-24 MED ORDER — VITAMIN D 1000 UNITS PO TABS
2000.0000 [IU] | ORAL_TABLET | Freq: Every day | ORAL | Status: DC
  Administered 2017-02-24 – 2017-02-26 (×3): 2000 [IU] via ORAL
  Filled 2017-02-24 (×3): qty 2

## 2017-02-24 MED ORDER — FENTANYL CITRATE (PF) 100 MCG/2ML IJ SOLN
25.0000 ug | INTRAMUSCULAR | Status: DC | PRN
  Administered 2017-02-24 (×2): 50 ug via INTRAVENOUS
  Filled 2017-02-24 (×2): qty 2

## 2017-02-24 MED ORDER — FLECAINIDE ACETATE 50 MG PO TABS
50.0000 mg | ORAL_TABLET | Freq: Every day | ORAL | Status: DC
  Administered 2017-02-24 – 2017-02-26 (×3): 50 mg via ORAL
  Filled 2017-02-24 (×3): qty 1

## 2017-02-24 MED ORDER — PROMETHAZINE HCL 25 MG/ML IJ SOLN
12.5000 mg | Freq: Four times a day (QID) | INTRAMUSCULAR | Status: DC | PRN
  Administered 2017-02-24: 12.5 mg via INTRAVENOUS
  Filled 2017-02-24: qty 1

## 2017-02-24 MED ORDER — ASPIRIN EC 81 MG PO TBEC
81.0000 mg | DELAYED_RELEASE_TABLET | Freq: Every day | ORAL | Status: DC
  Administered 2017-02-24 – 2017-02-26 (×3): 81 mg via ORAL
  Filled 2017-02-24 (×3): qty 1

## 2017-02-24 MED ORDER — HYDRALAZINE HCL 50 MG PO TABS
25.0000 mg | ORAL_TABLET | Freq: Three times a day (TID) | ORAL | Status: DC
  Administered 2017-02-24 – 2017-02-26 (×7): 25 mg via ORAL
  Filled 2017-02-24 (×7): qty 1

## 2017-02-24 MED ORDER — METOPROLOL SUCCINATE ER 25 MG PO TB24
25.0000 mg | ORAL_TABLET | Freq: Every day | ORAL | Status: DC
  Administered 2017-02-24 – 2017-02-25 (×2): 25 mg via ORAL
  Filled 2017-02-24 (×2): qty 1

## 2017-02-24 MED ORDER — DOCUSATE SODIUM 100 MG PO CAPS: 100.0000 mg | ORAL_CAPSULE | Freq: Two times a day (BID) | ORAL | Status: DC | PRN

## 2017-02-24 MED ORDER — SODIUM CHLORIDE 0.9% FLUSH
3.0000 mL | Freq: Two times a day (BID) | INTRAVENOUS | Status: DC
  Administered 2017-02-24 – 2017-02-25 (×2): 3 mL via INTRAVENOUS

## 2017-02-24 MED ORDER — LABETALOL HCL 5 MG/ML IV SOLN
5.0000 mg | INTRAVENOUS | Status: DC | PRN
  Administered 2017-02-24 – 2017-02-25 (×6): 10 mg via INTRAVENOUS
  Administered 2017-02-26: 5 mg via INTRAVENOUS
  Administered 2017-02-26: 10 mg via INTRAVENOUS
  Filled 2017-02-24 (×8): qty 4

## 2017-02-24 MED ORDER — ONDANSETRON HCL 4 MG PO TABS: 4.0000 mg | ORAL_TABLET | Freq: Four times a day (QID) | ORAL | Status: DC | PRN

## 2017-02-24 MED ORDER — SODIUM CHLORIDE 0.9 % IV SOLN
INTRAVENOUS | Status: DC
  Administered 2017-02-24: 02:00:00 via INTRAVENOUS

## 2017-02-24 MED ORDER — ACETAMINOPHEN 650 MG RE SUPP: 650.0000 mg | Freq: Four times a day (QID) | RECTAL | Status: DC | PRN

## 2017-02-24 MED ORDER — DIPHENHYDRAMINE HCL 50 MG/ML IJ SOLN
25.0000 mg | Freq: Three times a day (TID) | INTRAMUSCULAR | Status: DC | PRN
  Administered 2017-02-24: 25 mg via INTRAVENOUS
  Filled 2017-02-24: qty 1

## 2017-02-24 MED ORDER — TRAZODONE HCL 50 MG PO TABS
100.0000 mg | ORAL_TABLET | Freq: Every day | ORAL | Status: DC
  Administered 2017-02-24 – 2017-02-25 (×3): 100 mg via ORAL
  Filled 2017-02-24 (×3): qty 2

## 2017-02-24 MED ORDER — ONDANSETRON HCL 4 MG/2ML IJ SOLN
4.0000 mg | Freq: Four times a day (QID) | INTRAMUSCULAR | Status: DC | PRN
  Administered 2017-02-24 (×2): 4 mg via INTRAVENOUS
  Filled 2017-02-24 (×2): qty 2

## 2017-02-24 MED ORDER — SODIUM CHLORIDE 0.9 % IV SOLN
INTRAVENOUS | Status: DC
  Administered 2017-02-24: 09:00:00 via INTRAVENOUS
  Administered 2017-02-25 – 2017-02-26 (×2): 150 mL/h via INTRAVENOUS

## 2017-02-24 MED ORDER — KETOROLAC TROMETHAMINE 30 MG/ML IJ SOLN
30.0000 mg | Freq: Three times a day (TID) | INTRAMUSCULAR | Status: DC | PRN
  Administered 2017-02-24 – 2017-02-25 (×2): 30 mg via INTRAVENOUS
  Filled 2017-02-24 (×2): qty 1

## 2017-02-24 MED ORDER — CLONIDINE HCL 0.1 MG PO TABS
0.1000 mg | ORAL_TABLET | Freq: Every day | ORAL | Status: DC
  Administered 2017-02-25 – 2017-02-26 (×2): 0.1 mg via ORAL
  Filled 2017-02-24 (×2): qty 1

## 2017-02-24 NOTE — ED Notes (Signed)
Attempted report 

## 2017-02-24 NOTE — Progress Notes (Signed)
Admission from the ed by bed awake and alert,

## 2017-02-24 NOTE — H&P (Signed)
History and Physical    MIAROSE LIPPERT ZOX:[id number] DOB: 30-Mar-1934 DOA: 02/23/2017  PCP: Laurann Montana, MD   Patient coming from: Home  Chief Complaint: Headache, N/V   HPI: Cheryl [name] is a 83 y.o. female with medical history significant for coronary artery disease, chronic kidney disease stage III, hypertension, and paroxysmal atrial fibrillation, now presenting to the emergency department for evaluation of, nausea, and nonbloody vomiting.  Patient reports that she had been suffering from left hip pain, saw her orthopedist yesterday, and had an intra-articular steroid injection.  There was no immediate complication and she had improvement in her hip pain, but she then woke overnight with a headache, nausea, and nonbloody vomiting.  She needed to have these symptoms throughout the day today and comes in for evaluation.  Headache is similar to migraines that she has had in the past, including one that followed a prior steroid shot.  She denies any recent fevers or chills and denies any recent upper respiratory illness or earache.  No recent fall or trauma and no change in vision or hearing, focal numbness or weakness, or loss of coordination.  ED Course: Upon arrival to the ED, patient is found to be afebrile, saturating well on room air, hypertensive to 220/90, and vitals otherwise normal.  Chemistry panel reveals a sodium of 122 and elevated BUN to creatinine ratio.  CBC is unremarkable.  Noncontrast head CT is negative for acute intracranial abnormality.  Concerned that there may be some neck stiffness in the ED and LP was performed with fluid sent for Gram stain, culture, and HSV.  Patient was treated with Compazine, Benadryl, Zofran, and 1 L of normal saline.  Headache and nausea improved with this and blood pressure has also come down with these measures.  Patient will be admitted to the stepdown unit for ongoing evaluation and severe hyponatremia, hypertensive urgency, and headache with  nausea and vomiting.  Review of Systems:  All other systems reviewed and apart from HPI, are negative.  Past Medical History:  Diagnosis Date  . Arthritis   . CAD (coronary artery disease)    Mild per remote cath in 2004; normal nuclear in 2012  . Chronic kidney disease    stage3 kidney disease   . Complication of anesthesia    slow to wake up  . Diastolic dysfunction    Grade I, per echo in 2012  . Dysrhythmia   . GERD (gastroesophageal reflux disease)    prevacid  . H/O hiatal hernia   . Headache(784.0)    sinus  . High risk medication use    Flecainide therapy  . History of heartburn   . Hyperlipidemia   . Hypertension   . LVH (left ventricular hypertrophy)    Per echo in July 2012  . Normal nuclear stress test July 2012  . Obesity   . PAF (paroxysmal atrial fibrillation) (HCC)   . Pneumonia    20 yrs ago  . Syncope July 2012    Past Surgical History:  Procedure Laterality Date  . APPENDECTOMY  1952  . BACK SURGERY  1980, 2010   x2  . CARDIAC CATHETERIZATION  07/28/2002   EF GREATER THAN 55%; MILD CAD  . COLONOSCOPY    . DILATION AND CURETTAGE OF UTERUS    . LUMBAR LAMINECTOMY/DECOMPRESSION MICRODISCECTOMY Right 02/15/2015   Procedure: Right Thoracic twelve-Lumbar one  Microdiskectomy;  Surgeon: Tressie Stalker, MD;  Location: [facility];  Service: Neurosurgery;  Laterality: Right;  Thoracic/Lumbar  .  TONSILLECTOMY     age 16 yrs  . TUBAL LIGATION       reports that  has never smoked. she has never used smokeless tobacco. She reports that she does not drink alcohol or use drugs.  Allergies  Allergen Reactions  . Amlodipine Besylate     REACTION: dizzy  . Codeine Phosphate     headache  . Lipitor [Atorvastatin Calcium]     Muscle pain and tiredness  . Sulfonamide Derivatives     REACTION: unspecified    Family History  Problem Relation Age of Onset  . Lung cancer Father      Prior to Admission medications   Medication Sig Start Date End Date  Taking? Authorizing Provider  acetaminophen (TYLENOL) 500 MG tablet Take 500-1,000 mg by mouth every 6 (six) hours as needed for mild pain.   Yes [provider]  aspirin EC 81 MG tablet Take 81 mg by mouth daily.   Yes [provider]  Biotin 10 MG CAPS Take 10 mg by mouth daily.   Yes [provider]  Cholecalciferol (VITAMIN D) 2000 UNITS tablet Take 2,000 Units by mouth daily.     Yes [provider]  docusate sodium (COLACE) 100 MG capsule Take 1 capsule (100 mg total) by mouth 2 (two) times daily. Patient taking differently: Take 100 mg by mouth 2 (two) times daily as needed for mild constipation.  02/16/15  Yes Tressie Stalker, MD  flecainide (TAMBOCOR) 50 MG tablet Take 1 tablet (50 mg total) by mouth daily. 02/17/16  Yes Jake Bathe, MD  ibuprofen (ADVIL,MOTRIN) 200 MG tablet Take 400 mg by mouth every 6 (six) hours as needed for moderate pain.   Yes [provider]  loratadine (CLARITIN) 10 MG tablet Take 10 mg by mouth daily as needed for allergies.   Yes [provider]  metoprolol succinate (TOPROL-XL) 25 MG 24 hr tablet Take 25 mg by mouth daily. 05/28/14  Yes [provider]  traZODone (DESYREL) 100 MG tablet Take 100 mg by mouth at bedtime.     Yes [provider]  triamterene-hydrochlorothiazide (DYAZIDE) 37.5-25 MG capsule Take 1 capsule by mouth every morning. 01/10/17  Yes [provider]    Physical Exam: Vitals:   02/23/17 2047 02/23/17 2052 02/23/17 2251 02/23/17 2300  BP:  (!) 220/91 (!) 203/98 (!) 191/92  Pulse:  66 80 75  Resp:  16 18 16   Temp:  98.4 F (36.9 C)    TempSrc:  Oral    SpO2: 96% 95% 99% 98%      Constitutional: NAD, calm, appears uncomfortable  Eyes: PERTLA, lids and conjunctivae normal ENMT: Mucous membranes are moist. Posterior pharynx clear of any exudate or lesions.   Neck: normal, supple, no masses, no thyromegaly Respiratory: clear to auscultation  bilaterally, no wheezing, no crackles. Normal respiratory effort.    Cardiovascular: S1 & S2 heard, regular rate and rhythm. No significant JVD. Abdomen: No distension, no tenderness, no masses palpated. Bowel sounds normal.  Musculoskeletal: no clubbing / cyanosis. No joint deformity upper and lower extremities.   Skin: no significant rashes, lesions, ulcers. Warm, dry, well-perfused. Neurologic: CN 2-12 grossly intact. Sensation intact. Strength 5/5 in all 4 limbs.  Psychiatric: Alert and oriented x 3. Calm, cooperative.     Labs on Admission: I have personally reviewed following labs and imaging studies  CBC: Recent Labs  Lab 02/23/17 2127 02/23/17 2137  WBC 9.1  --   NEUTROABS 8.2*  --  HGB 14.1 14.3  HCT 39.4 42.0  MCV 91.8  --   PLT PLATELET CLUMPS NOTED ON SMEAR, COUNT APPEARS ADEQUATE  --    Basic Metabolic Panel: Recent Labs  Lab 02/23/17 2137 02/23/17 2300  NA 122* 125*  K 4.3 4.3  CL 91* 89*  CO2  --  21*  GLUCOSE 149* 147*  BUN 32* 27*  CREATININE 0.90 1.03*  CALCIUM  --  10.1   GFR: CrCl cannot be calculated (Unknown ideal weight.). Liver Function Tests: No results for input(s): AST, ALT, ALKPHOS, BILITOT, PROT, ALBUMIN in the last 168 hours. No results for input(s): LIPASE, AMYLASE in the last 168 hours. No results for input(s): AMMONIA in the last 168 hours. Coagulation Profile: No results for input(s): INR, PROTIME in the last 168 hours. Cardiac Enzymes: No results for input(s): CKTOTAL, CKMB, CKMBINDEX, TROPONINI in the last 168 hours. BNP (last 3 results) No results for input(s): PROBNP in the last 8760 hours. HbA1C: No results for input(s): HGBA1C in the last 72 hours. CBG: No results for input(s): GLUCAP in the last 168 hours. Lipid Profile: No results for input(s): CHOL, HDL, LDLCALC, TRIG, CHOLHDL, LDLDIRECT in the last 72 hours. Thyroid Function Tests: No results for input(s): TSH, T4TOTAL, FREET4, T3FREE, THYROIDAB in the last 72  hours. Anemia Panel: No results for input(s): VITAMINB12, FOLATE, FERRITIN, TIBC, IRON, RETICCTPCT in the last 72 hours. Urine analysis:    Component Value Date/Time   COLORURINE YELLOW 03/04/2015 1427   APPEARANCEUR CLEAR 03/04/2015 1427   LABSPEC 1.009 03/04/2015 1427   PHURINE 8.0 03/04/2015 1427   GLUCOSEU NEGATIVE 03/04/2015 1427   HGBUR NEGATIVE 03/04/2015 1427   BILIRUBINUR NEGATIVE 03/04/2015 1427   KETONESUR NEGATIVE 03/04/2015 1427   PROTEINUR 100 (A) 03/04/2015 1427   UROBILINOGEN 0.2 08/05/2008 1450   NITRITE NEGATIVE 03/04/2015 1427   LEUKOCYTESUR NEGATIVE 03/04/2015 1427   Sepsis Labs: @LABRCNTIP (procalcitonin:4,lacticidven:4) ) Recent Results (from the past 240 hour(s))  CSF culture     Status: None (Preliminary result)   Collection Time: 02/23/17 10:40 PM  Result Value Ref Range Status   Specimen Description CSF  Final   Special Requests NONE  Final   Gram Stain   Final    WBC PRESENT, PREDOMINANTLY MONONUCLEAR NO ORGANISMS SEEN CYTOSPIN SMEAR    Culture PENDING  Incomplete   Report Status PENDING  Incomplete     Radiological Exams on Admission: Ct Head Wo Contrast  Result Date: 02/23/2017 CLINICAL DATA:  Headache with nausea EXAM: CT HEAD WITHOUT CONTRAST TECHNIQUE: Contiguous axial images were obtained from the base of the skull through the vertex without intravenous contrast. COMPARISON:  Brain MRI 03/04/2015, CT brain 01/28/2015 FINDINGS: Brain: No evidence of acute infarction, hemorrhage, hydrocephalus, extra-axial collection or mass lesion/mass effect. Vascular: No hyperdense vessels.  Carotid artery calcifications. Skull: Normal. Negative for fracture or focal lesion. Sinuses/Orbits: Mucosal thickening in the sphenoid, and maxillary sinus. No acute orbital abnormality. Other: None IMPRESSION: Negative.  No CT evidence for acute intracranial abnormality. Electronically Signed   By: Jasmine Pang M.D.   On: 02/23/2017 22:06   Xr C-arm No Report  Result  Date: 02/22/2017 Please see Notes or Procedures tab for imaging impression.  Xr Hip Unilat W Or W/o Pelvis 1v Left  Result Date: 02/23/2017 AP of pelvis and left hip x-ray shows prior lumbar instrumented fusion at L3-4 and L4-5 with some lumbar scoliosis mild and slightly lower left ilium height compared to right.  The sacroiliac joints do not show a  great deal of sclerosis.  Both the right and left hip show some periarticular osteophytes but is clearly more left than right.  Appears to be at least moderate arthritis on the left.   EKG: Not performed.   Assessment/Plan  1. Hyponatremia  - Serum sodium is 122 on admission; normal on last chem panel from a yr ago  - She is hypovolemic in setting of N/V, also on HCTZ   - Treated in ED with 1 liter NS  - Check urine sodium and urine osm, hold HCTZ, repeat chem panel now and continue IVF with choice of fluids based on repeat sodium   2. Headache   - Presents with headache since early am, associated with photophobia, N/V  - Describes as similar to prior migraines and daughter reports she got one after a prior steroid shot as well (was treated with intraarticular steroid to left hip day prior to admission)  - Head CT negative for acute findings and no focal deficit appreciated  - There was question of neck stiffness in ED and LP was performed  - No fever or leukocytosis and no encephalopathy  - Improved following compazine, Benadryl, and Zofran in ED  - Follow CSF gram stain and culture, HSV  - Continue supportive care   3. Hypertension with hypertensive urgency  - SBP 220 in ED, likely secondary to pain and N/V  - Improved with treatment of headache and N/V  - Use labetalol IVP's prn, continue Toprol, hold diuretic while hydrating, hold HCTZ in light of hyponatremia    4. Paroxysmal atrial fibrillation  - CHADS-VASc is 804 (age x2, gender, HTN)  - She is followed by cardiology, managed with flecainide, and has refused to be anticoagulated  per cardiology notes  - Continue flecainide    DVT prophylaxis: SCD's  Code Status: Full  Family Communication: Daughter updated at bedside Disposition Plan: Admit to SDU Consults called: None Admission status: Inpatient    Briscoe Deutscherimothy S Samayra Hebel, MD [facility] Pager [phone]  If 7PM-7AM, please contact night-coverage [email or web address] Password [email or web address]  02/24/2017, 12:03 AM

## 2017-02-24 NOTE — Progress Notes (Signed)
PROGRESS NOTE    Cheryl [name]  ZOX:[id number] DOB: Jan 30, 1935 DOA: 02/23/2017 PCP: Laurann MontanaWhite, Cynthia, MD     Brief Narrative:  Cheryl [name] is a 83 y.o. female with medical history significant for coronary artery disease, chronic kidney disease stage III, hypertension, and paroxysmal atrial fibrillation, now presenting to the emergency department for evaluation of nausea, and nonbloody vomiting.  Patient reports that she had been suffering from left hip pain, saw her orthopedist yesterday, and had an intra-articular steroid injection.  There was no immediate complication and she had improvement in her hip pain, but she then woke overnight with a headache, nausea, and nonbloody vomiting.  She continued to have these symptoms throughout the day and comes in for evaluation.  Headache is similar to migraines that she has had in the past, including one that followed a prior steroid shot.  In the emergency department, patient was found to be hypertensive 220/90, hyponatremia with sodium 122.  Noncontrast head CT was negative for acute intracranial abnormality.  Due to concern for some neck stiffness, LP was performed.  Patient was admitted for ongoing evaluation of severe hyponatremia, hypertensive urgency, headache with nausea and vomiting.   Assessment & Plan:   Principal Problem:   Hyponatremia Active Problems:   Essential hypertension   PAF (paroxysmal atrial fibrillation) (HCC)   Headache   Nausea & vomiting   Hypertensive urgency  Hyponatremia -Hold HCTZ -Continue IV fluids, sodium levels are improving, continue to trend BMP   Headache -Associated symptoms of photophobia, nausea, vomiting.  Headache is temporal.  Describes similar prior migraines -Head CT was negative for acute findings, no focal deficit -S/p LP due to question of neck stiffness. CSF WBC 2-3, culture pending  -Continue migraine cocktail  Hypertensive urgency -Toprol -Hold HCTZ due to hyponatremia -Start hydralazine,  catapres  Paroxysmal atrial fibrillation -CHADS-VASc is 474 (age x2, gender, HTN)  -Has refused anticoagulation per cardiology notes  -Continue flecainide, baby aspirin    DVT prophylaxis: SCD Code Status: Full Family Communication: At bedside Disposition Plan: Pending improvement   Consultants:   None  Procedures:   None   Antimicrobials:  Anti-infectives (From admission, onward)   None        Subjective: Complains of headache located in bilateral temples.  Laying quietly in a dark room.  Objective: Vitals:   02/24/17 1215 02/24/17 1300 02/24/17 1330 02/24/17 1400  BP: (!) 198/98 (!) 206/99 (!) 162/92 (!) 199/99  Pulse: 74 73 73 75  Resp: 20 14 11 15   Temp:      TempSrc:      SpO2: 95% 100% 97% 97%    Intake/Output Summary (Last 24 hours) at 02/24/2017 1417 Last data filed at 02/24/2017 0214 Gross per 24 hour  Intake 1275 ml  Output -  Net 1275 ml   There were no vitals filed for this visit.  Examination:  General exam: Appears calm Respiratory system: Clear to auscultation. Respiratory effort normal. Cardiovascular system: S1 & S2 heard, RRR. No JVD, murmurs, rubs, gallops or clicks. No pedal edema. Gastrointestinal system: Abdomen is nondistended, soft and nontender. No organomegaly or masses felt. Normal bowel sounds heard. Central nervous system: Alert and oriented. No focal neurological deficits. Extremities: Symmetric 5 x 5 power. Skin: No rashes, lesions or ulcers Psychiatry: Judgement and insight appear normal. Mood & affect appropriate.   Data Reviewed: I have personally reviewed following labs and imaging studies  CBC: Recent Labs  Lab 02/23/17 2127 02/23/17 2137 02/24/17 0359  WBC 9.1  --  11.1*  NEUTROABS 8.2*  --  10.0*  HGB 14.1 14.3 13.1  HCT 39.4 42.0 37.3  MCV 91.8  --  91.4  PLT PLATELET CLUMPS NOTED ON SMEAR, COUNT APPEARS ADEQUATE  --  258   Basic Metabolic Panel: Recent Labs  Lab 02/23/17 2137 02/23/17 2300  02/24/17 0359  NA 122* 125* 126*  K 4.3 4.3 3.8  CL 91* 89* 93*  CO2  --  21* 20*  GLUCOSE 149* 147* 144*  BUN 32* 27* 23*  CREATININE 0.90 1.03* 0.97  CALCIUM  --  10.1 9.5   GFR: CrCl cannot be calculated (Unknown ideal weight.). Liver Function Tests: No results for input(s): AST, ALT, ALKPHOS, BILITOT, PROT, ALBUMIN in the last 168 hours. No results for input(s): LIPASE, AMYLASE in the last 168 hours. No results for input(s): AMMONIA in the last 168 hours. Coagulation Profile: No results for input(s): INR, PROTIME in the last 168 hours. Cardiac Enzymes: No results for input(s): CKTOTAL, CKMB, CKMBINDEX, TROPONINI in the last 168 hours. BNP (last 3 results) No results for input(s): PROBNP in the last 8760 hours. HbA1C: No results for input(s): HGBA1C in the last 72 hours. CBG: Recent Labs  Lab 02/24/17 0813  GLUCAP 145*   Lipid Profile: No results for input(s): CHOL, HDL, LDLCALC, TRIG, CHOLHDL, LDLDIRECT in the last 72 hours. Thyroid Function Tests: No results for input(s): TSH, T4TOTAL, FREET4, T3FREE, THYROIDAB in the last 72 hours. Anemia Panel: No results for input(s): VITAMINB12, FOLATE, FERRITIN, TIBC, IRON, RETICCTPCT in the last 72 hours. Sepsis Labs: No results for input(s): PROCALCITON, LATICACIDVEN in the last 168 hours.  Recent Results (from the past 240 hour(s))  CSF culture     Status: None (Preliminary result)   Collection Time: 02/23/17 10:40 PM  Result Value Ref Range Status   Specimen Description CSF  Final   Special Requests NONE  Final   Gram Stain   Final    WBC PRESENT, PREDOMINANTLY MONONUCLEAR NO ORGANISMS SEEN CYTOSPIN SMEAR    Culture NO GROWTH 1 DAY  Final   Report Status PENDING  Incomplete       Radiology Studies: Ct Head Wo Contrast  Result Date: 02/23/2017 CLINICAL DATA:  Headache with nausea EXAM: CT HEAD WITHOUT CONTRAST TECHNIQUE: Contiguous axial images were obtained from the base of the skull through the vertex without  intravenous contrast. COMPARISON:  Brain MRI 03/04/2015, CT brain 01/28/2015 FINDINGS: Brain: No evidence of acute infarction, hemorrhage, hydrocephalus, extra-axial collection or mass lesion/mass effect. Vascular: No hyperdense vessels.  Carotid artery calcifications. Skull: Normal. Negative for fracture or focal lesion. Sinuses/Orbits: Mucosal thickening in the sphenoid, and maxillary sinus. No acute orbital abnormality. Other: None IMPRESSION: Negative.  No CT evidence for acute intracranial abnormality. Electronically Signed   By: Jasmine Pang M.D.   On: 02/23/2017 22:06      Scheduled Meds: . aspirin EC  81 mg Oral Daily  . cholecalciferol  2,000 Units Oral Daily  . flecainide  50 mg Oral Daily  . hydrALAZINE  25 mg Oral Q8H  . metoprolol succinate  25 mg Oral Daily  . sodium chloride flush  3 mL Intravenous Q12H  . traZODone  100 mg Oral QHS   Continuous Infusions: . sodium chloride 100 mL/hr at 02/24/17 0848     LOS: 1 day    Time spent: 40 minutes   Noralee Stain, DO [facility] [email or web address] Password [email or web address] 02/24/2017, 2:17 PM

## 2017-02-25 LAB — BASIC METABOLIC PANEL
ANION GAP: 11 (ref 5–15)
ANION GAP: 8 (ref 5–15)
ANION GAP: 9 (ref 5–15)
BUN: 23 mg/dL — AB (ref 6–20)
BUN: 23 mg/dL — ABNORMAL HIGH (ref 6–20)
BUN: 22 mg/dL — ABNORMAL HIGH (ref 6–20)
CHLORIDE: 96 mmol/L — AB (ref 101–111)
CHLORIDE: 99 mmol/L — AB (ref 101–111)
CHLORIDE: 100 mmol/L — AB (ref 101–111)
CO2: 20 mmol/L — ABNORMAL LOW (ref 22–32)
CO2: 20 mmol/L — AB (ref 22–32)
CO2: 19 mmol/L — ABNORMAL LOW (ref 22–32)
Calcium: 9.1 mg/dL (ref 8.9–10.3)
Calcium: 8.8 mg/dL — ABNORMAL LOW (ref 8.9–10.3)
Calcium: 8.9 mg/dL (ref 8.9–10.3)
Creatinine, Ser: 0.97 mg/dL (ref 0.44–1.00)
Creatinine, Ser: 0.95 mg/dL (ref 0.44–1.00)
Creatinine, Ser: 1.07 mg/dL — ABNORMAL HIGH (ref 0.44–1.00)
GFR calc Af Amer: >60 mL/min (ref >60)
GFR calc non Af Amer: 55 mL/min — ABNORMAL LOW (ref >60)
GFR calc non Af Amer: 48 mL/min — ABNORMAL LOW (ref >60)
GFR, EST AFRICAN AMERICAN: 55 mL/min — AB (ref >60)
GFR, EST NON AFRICAN AMERICAN: 54 mL/min — AB (ref >60)
GLUCOSE: 168 mg/dL — AB (ref 65–99)
Glucose, Bld: 128 mg/dL — ABNORMAL HIGH (ref 65–99)
Glucose, Bld: 124 mg/dL — ABNORMAL HIGH (ref 65–99)
POTASSIUM: 3.8 mmol/L (ref 3.5–5.1)
POTASSIUM: 3.8 mmol/L (ref 3.5–5.1)
POTASSIUM: 3.8 mmol/L (ref 3.5–5.1)
SODIUM: 127 mmol/L — AB (ref 135–145)
Sodium: 127 mmol/L — ABNORMAL LOW (ref 135–145)
Sodium: 128 mmol/L — ABNORMAL LOW (ref 135–145)

## 2017-02-25 LAB — HERPES SIMPLEX VIRUS(HSV) DNA BY PCR
HSV 1 DNA: NEGATIVE
HSV 2 DNA: NEGATIVE

## 2017-02-25 LAB — CBC
HEMATOCRIT: 36 % (ref 36.0–46.0)
HEMOGLOBIN: 12.5 g/dL (ref 12.0–15.0)
MCH: 31.9 pg (ref 26.0–34.0)
MCHC: 34.7 g/dL (ref 30.0–36.0)
MCV: 91.8 fL (ref 78.0–100.0)
Platelets: 229 10*3/uL (ref 150–400)
RBC: 3.92 MIL/uL (ref 3.87–5.11)
RDW: 12 % (ref 11.5–15.5)
WBC: 10.2 10*3/uL (ref 4.0–10.5)

## 2017-02-25 NOTE — Progress Notes (Signed)
PROGRESS NOTE    Cheryl QualeLina B Iott  ZOX:[id number] DOB: 1934-08-15 DOA: 02/23/2017 PCP: Laurann MontanaWhite, Cynthia, MD     Brief Narrative:  Cheryl [name] is a 83 y.o. female with medical history significant for coronary artery disease, chronic kidney disease stage III, hypertension, and paroxysmal atrial fibrillation, now presenting to the emergency department for evaluation of nausea, and nonbloody vomiting.  Patient reports that she had been suffering from left hip pain, saw her orthopedist yesterday, and had an intra-articular steroid injection.  There was no immediate complication and she had improvement in her hip pain, but she then woke overnight with a headache, nausea, and nonbloody vomiting.  She continued to have these symptoms throughout the day and comes in for evaluation.  Headache is similar to migraines that she has had in the past, including one that followed a prior steroid shot.  In the emergency department, patient was found to be hypertensive 220/90, hyponatremia with sodium 122.  Noncontrast head CT was negative for acute intracranial abnormality.  Due to concern for some neck stiffness, LP was performed.  Patient was admitted for ongoing evaluation of severe hyponatremia, hypertensive urgency, headache with nausea and vomiting.   Assessment & Plan:   Principal Problem:   Hyponatremia Active Problems:   Essential hypertension   PAF (paroxysmal atrial fibrillation) (HCC)   Headache   Nausea & vomiting   Hypertensive urgency  Hyponatremia -Hold HCTZ -Continue IV fluids, continue to trend BMP. IVF rate increased this morning as sodium levels plateaued    Headache -Associated symptoms of photophobia, nausea, vomiting.  Headache is temporal.  Describes similar prior migraines -Head CT was negative for acute findings, no focal deficit -S/p LP due to question of neck stiffness. CSF WBC 2-3, culture pending  -Continue migraine cocktail -Improved   Hypertensive urgency -Toprol -Hold  HCTZ due to hyponatremia -Start hydralazine, catapres -BP improved   Paroxysmal atrial fibrillation -CHADS-VASc is 24 (age x2, gender, HTN)  -Has refused anticoagulation per cardiology notes  -Continue flecainide, baby aspirin    DVT prophylaxis: SCD Code Status: Full Family Communication: At bedside Disposition Plan: Pending improvement   Consultants:   None  Procedures:   None   Antimicrobials:  Anti-infectives (From admission, onward)   None       Subjective: Headache is somewhat better. Wants to ambulate. Eating breakfast without issue.   Objective: Vitals:   02/25/17 0616 02/25/17 0700 02/25/17 0800 02/25/17 0900  BP: (!) 168/75 (!) 156/67 (!) 165/78 (!) 170/77  Pulse:  68 86 84  Resp:  10 13 18   Temp:  98.8 F (37.1 C)    TempSrc:  Oral    SpO2:  96%    Weight:      Height:        Intake/Output Summary (Last 24 hours) at 02/25/2017 0943 Last data filed at 02/25/2017 0900 Gross per 24 hour  Intake 1813.33 ml  Output 1100 ml  Net 713.33 ml   Filed Weights   02/25/17 0600  Weight: 70.1 kg (154 lb 8.7 oz)    Examination:  General exam: Appears calm and comfortable  Respiratory system: Clear to auscultation. Respiratory effort normal. Cardiovascular system: S1 & S2 heard, RRR. No JVD, murmurs, rubs, gallops or clicks. No pedal edema. Gastrointestinal system: Abdomen is nondistended, soft and nontender. No organomegaly or masses felt. Normal bowel sounds heard. Central nervous system: Alert and oriented. No focal neurological deficits. Extremities: Symmetric 5 x 5 power. Skin: No rashes, lesions or ulcers Psychiatry: Judgement and  insight appear normal. Mood & affect appropriate.   Data Reviewed: I have personally reviewed following labs and imaging studies  CBC: Recent Labs  Lab 02/23/17 2127 02/23/17 2137 02/24/17 0359 02/25/17 0702  WBC 9.1  --  11.1* 10.2  NEUTROABS 8.2*  --  10.0*  --   HGB 14.1 14.3 13.1 12.5  HCT 39.4 42.0 37.3  36.0  MCV 91.8  --  91.4 91.8  PLT PLATELET CLUMPS NOTED ON SMEAR, COUNT APPEARS ADEQUATE  --  258 229   Basic Metabolic Panel: Recent Labs  Lab 02/23/17 2300 02/24/17 0359 02/24/17 1644 02/24/17 2206 02/25/17 0702  NA 125* 126* 124* 124* 127*  K 4.3 3.8 4.7 3.9 3.8  CL 89* 93* 92* 95* 96*  CO2 21* 20* 19* 18* 20*  GLUCOSE 147* 144* 132* 142* 128*  BUN 27* 23* 21* 20 23*  CREATININE 1.03* 0.97 0.92 0.89 0.97  CALCIUM 10.1 9.5 9.5 9.0 9.1   GFR: Estimated Creatinine Clearance: 44.5 mL/min (by C-G formula based on SCr of 0.97 mg/dL). Liver Function Tests: No results for input(s): AST, ALT, ALKPHOS, BILITOT, PROT, ALBUMIN in the last 168 hours. No results for input(s): LIPASE, AMYLASE in the last 168 hours. No results for input(s): AMMONIA in the last 168 hours. Coagulation Profile: No results for input(s): INR, PROTIME in the last 168 hours. Cardiac Enzymes: No results for input(s): CKTOTAL, CKMB, CKMBINDEX, TROPONINI in the last 168 hours. BNP (last 3 results) No results for input(s): PROBNP in the last 8760 hours. HbA1C: No results for input(s): HGBA1C in the last 72 hours. CBG: Recent Labs  Lab 02/24/17 0813  GLUCAP 145*   Lipid Profile: No results for input(s): CHOL, HDL, LDLCALC, TRIG, CHOLHDL, LDLDIRECT in the last 72 hours. Thyroid Function Tests: No results for input(s): TSH, T4TOTAL, FREET4, T3FREE, THYROIDAB in the last 72 hours. Anemia Panel: No results for input(s): VITAMINB12, FOLATE, FERRITIN, TIBC, IRON, RETICCTPCT in the last 72 hours. Sepsis Labs: No results for input(s): PROCALCITON, LATICACIDVEN in the last 168 hours.  Recent Results (from the past 240 hour(s))  CSF culture     Status: None (Preliminary result)   Collection Time: 02/23/17 10:40 PM  Result Value Ref Range Status   Specimen Description CSF  Final   Special Requests NONE  Final   Gram Stain   Final    WBC PRESENT, PREDOMINANTLY MONONUCLEAR NO ORGANISMS SEEN CYTOSPIN SMEAR     Culture NO GROWTH 1 DAY  Final   Report Status PENDING  Incomplete  MRSA PCR Screening     Status: None   Collection Time: 02/24/17  4:28 PM  Result Value Ref Range Status   MRSA by PCR NEGATIVE NEGATIVE Final    Comment:        The GeneXpert MRSA Assay (FDA approved for NASAL specimens only), is one component of a comprehensive MRSA colonization surveillance program. It is not intended to diagnose MRSA infection nor to guide or monitor treatment for MRSA infections.        Radiology Studies: Ct Head Wo Contrast  Result Date: 02/23/2017 CLINICAL DATA:  Headache with nausea EXAM: CT HEAD WITHOUT CONTRAST TECHNIQUE: Contiguous axial images were obtained from the base of the skull through the vertex without intravenous contrast. COMPARISON:  Brain MRI 03/04/2015, CT brain 01/28/2015 FINDINGS: Brain: No evidence of acute infarction, hemorrhage, hydrocephalus, extra-axial collection or mass lesion/mass effect. Vascular: No hyperdense vessels.  Carotid artery calcifications. Skull: Normal. Negative for fracture or focal lesion. Sinuses/Orbits: Mucosal thickening in  the sphenoid, and maxillary sinus. No acute orbital abnormality. Other: None IMPRESSION: Negative.  No CT evidence for acute intracranial abnormality. Electronically Signed   By: Jasmine Pang M.D.   On: 02/23/2017 22:06      Scheduled Meds: . aspirin EC  81 mg Oral Daily  . cholecalciferol  2,000 Units Oral Daily  . cloNIDine  0.1 mg Oral Daily  . flecainide  50 mg Oral Daily  . hydrALAZINE  25 mg Oral Q8H  . metoprolol succinate  25 mg Oral Daily  . sodium chloride flush  3 mL Intravenous Q12H  . traZODone  100 mg Oral QHS   Continuous Infusions: . sodium chloride 150 mL/hr at 02/25/17 0900     LOS: 2 days    Time spent: 30 minutes   Noralee Stain, DO [facility] [email or web address] Password [facility] 02/25/2017, 9:43 AM

## 2017-02-26 LAB — BASIC METABOLIC PANEL
Anion gap: 7 (ref 5–15)
BUN: 24 mg/dL — AB (ref 6–20)
CALCIUM: 8.9 mg/dL (ref 8.9–10.3)
CHLORIDE: 104 mmol/L (ref 101–111)
CO2: 20 mmol/L — AB (ref 22–32)
CREATININE: 0.99 mg/dL (ref 0.44–1.00)
GFR calc Af Amer: >60 mL/min (ref >60)
GFR calc non Af Amer: 52 mL/min — ABNORMAL LOW (ref >60)
Glucose, Bld: 121 mg/dL — ABNORMAL HIGH (ref 65–99)
Potassium: 4.1 mmol/L (ref 3.5–5.1)
Sodium: 131 mmol/L — ABNORMAL LOW (ref 135–145)

## 2017-02-26 LAB — GLUCOSE, CAPILLARY: Glucose-Capillary: 125 mg/dL — ABNORMAL HIGH (ref 65–99)

## 2017-02-26 MED ORDER — CLONIDINE HCL 0.1 MG PO TABS
0.1000 mg | ORAL_TABLET | Freq: Every day | ORAL | 0 refills | Status: DC
Start: 2017-02-27 — End: 2017-11-14

## 2017-02-26 MED ORDER — METOPROLOL SUCCINATE ER 50 MG PO TB24: 50.0000 mg | ORAL_TABLET | Freq: Every day | ORAL | 0 refills | Status: DC

## 2017-02-26 MED ORDER — LISINOPRIL 20 MG PO TABS
20.0000 mg | ORAL_TABLET | Freq: Every day | ORAL | Status: DC
  Administered 2017-02-26: 20 mg via ORAL
  Filled 2017-02-26: qty 1

## 2017-02-26 MED ORDER — LISINOPRIL 20 MG PO TABS: 20.0000 mg | ORAL_TABLET | Freq: Every day | ORAL | 0 refills | Status: DC

## 2017-02-26 MED ORDER — METOPROLOL SUCCINATE ER 50 MG PO TB24
50.0000 mg | ORAL_TABLET | Freq: Every day | ORAL | Status: DC
  Administered 2017-02-26: 50 mg via ORAL
  Filled 2017-02-26: qty 1

## 2017-02-26 MED ORDER — HYDRALAZINE HCL 25 MG PO TABS: 25.0000 mg | ORAL_TABLET | Freq: Three times a day (TID) | ORAL | 0 refills | Status: DC

## 2017-02-26 NOTE — Progress Notes (Signed)
Patient discharged home with family, belongings and paperwork sent with patient. IV removed.

## 2017-02-26 NOTE — Discharge Instructions (Signed)

## 2017-02-26 NOTE — Discharge Summary (Signed)
Physician Discharge Summary  Cheryl [name] WJX:[id number] DOB: 1934-11-19 DOA: 02/23/2017  PCP: Laurann Montana, MD  Admit date: 02/23/2017 Discharge date: 02/26/2017  Admitted From: Home Disposition:  Home   Recommendations for Outpatient Follow-up:  1. Follow up with PCP in 1 week 2. Please obtain BMP in 1 week to ensure stability of sodium  3. Please follow up on final CSF culture, negative at day of discharge  Discharge Condition: Stable CODE STATUS: Full  Diet recommendation: Heart healthy   Brief/Interim Summary: Cheryl B Morrisis a 83 y.o.femalewith medical history significant forcoronary artery disease, chronic kidney disease stage III, hypertension, and paroxysmal atrial fibrillation, now presenting to the emergency department for evaluation of nausea, and nonbloody vomiting. Patient reports that she had been suffering from left hip pain, saw her orthopedist yesterday, and had an intra-articular steroid injection. There was no immediate complication and she had improvement in her hip pain, but she then woke overnight with a headache, nausea, and nonbloody vomiting. She continued to have these symptoms throughout the day and comes in for evaluation. Headache is similar to migraines that she has had in the past, including one that followed a prior steroid shot.  In the emergency department, patient was found to be hypertensive 220/90, hyponatremia with sodium 122.  Noncontrast head CT was negative for acute intracranial abnormality.  Due to concern for some neck stiffness, LP was performed.  Patient was admitted for ongoing evaluation of severe hyponatremia, hypertensive urgency, headache with nausea and vomiting.Patient's HCTZ was held and patient was given IV fluid normal saline.  Her sodium level continued to improve 122-->131.  Patient's headache resolved.  Due to hypertensive urgency, patient's antihypertensives were adjusted.  Discharge Diagnoses:  Principal Problem:    Hyponatremia Active Problems:   Essential hypertension   PAF (paroxysmal atrial fibrillation) (HCC)   Headache   Nausea & vomiting   Hypertensive urgency   Hyponatremia -Hold HCTZ -Improving, repeat labs as outpatient  Headache -Associated symptoms of photophobia, nausea, vomiting.  Headache is temporal.  Describes similar prior migraines -Head CT was negative for acute findings, no focal deficit -S/p LP due to question of neck stiffness. CSF WBC 2-3, culture negative to date -Improved   Hypertensive urgency -Toprol -Hold HCTZ due to hyponatremia -Start hydralazine, catapres, lisinopril -BP improved   Paroxysmal atrial fibrillation -CHADS-VASc is 51 (age x2, gender, HTN) -Has refused anticoagulation per cardiology notes -Continue flecainide, baby aspirin    Discharge Instructions  Discharge Instructions    Call MD for:   Complete by:  As directed    Elevated blood pressure   Call MD for:  difficulty breathing, headache or visual disturbances   Complete by:  As directed    Call MD for:  extreme fatigue   Complete by:  As directed    Call MD for:  hives   Complete by:  As directed    Call MD for:  persistant dizziness or light-headedness   Complete by:  As directed    Call MD for:  persistant nausea and vomiting   Complete by:  As directed    Call MD for:  severe uncontrolled pain   Complete by:  As directed    Call MD for:  temperature >100.4   Complete by:  As directed    Diet - low sodium heart healthy   Complete by:  As directed    Discharge instructions   Complete by:  As directed    You were cared for by a hospitalist during  your hospital stay. If you have any questions about your discharge medications or the care you received while you were in the hospital after you are discharged, you can call the unit and asked to speak with the hospitalist on call if the hospitalist that took care of you is not available. Once you are discharged, your primary care  physician will handle any further medical issues. Please note that NO REFILLS for any discharge medications will be authorized once you are discharged, as it is imperative that you return to your primary care physician (or establish a relationship with a primary care physician if you do not have one) for your aftercare needs so that they can reassess your need for medications and monitor your lab values.   Increase activity slowly   Complete by:  As directed      Allergies as of 02/26/2017      Reactions   Amlodipine Besylate    REACTION: dizzy   Codeine Phosphate    headache   Lipitor [atorvastatin Calcium]    Muscle pain and tiredness   Sulfonamide Derivatives    REACTION: unspecified      Medication List    STOP taking these medications   triamterene-hydrochlorothiazide 37.5-25 MG capsule Commonly known as:  DYAZIDE     TAKE these medications   acetaminophen 500 MG tablet Commonly known as:  TYLENOL Take 500-1,000 mg by mouth every 6 (six) hours as needed for mild pain.   aspirin EC 81 MG tablet Take 81 mg by mouth daily.   Biotin 10 MG Caps Take 10 mg by mouth daily.   cloNIDine 0.1 MG tablet Commonly known as:  CATAPRES Take 1 tablet (0.1 mg total) by mouth daily. Start taking on:  02/27/2017   docusate sodium 100 MG capsule Commonly known as:  COLACE Take 1 capsule (100 mg total) by mouth 2 (two) times daily. What changed:    when to take this  reasons to take this   flecainide 50 MG tablet Commonly known as:  TAMBOCOR Take 1 tablet (50 mg total) by mouth daily.   hydrALAZINE 25 MG tablet Commonly known as:  APRESOLINE Take 1 tablet (25 mg total) by mouth every 8 (eight) hours.   ibuprofen 200 MG tablet Commonly known as:  ADVIL,MOTRIN Take 400 mg by mouth every 6 (six) hours as needed for moderate pain.   lisinopril 20 MG tablet Commonly known as:  PRINIVIL,ZESTRIL Take 1 tablet (20 mg total) by mouth daily. Start taking on:  02/27/2017    loratadine 10 MG tablet Commonly known as:  CLARITIN Take 10 mg by mouth daily as needed for allergies.   metoprolol succinate 50 MG 24 hr tablet Commonly known as:  TOPROL-XL Take 1 tablet (50 mg total) by mouth daily. What changed:    medication strength  how much to take   traZODone 100 MG tablet Commonly known as:  DESYREL Take 100 mg by mouth at bedtime.   Vitamin D 2000 units tablet Take 2,000 Units by mouth daily.      Follow-up Information    Laurann Montana, MD. Schedule an appointment as soon as possible for a visit in 1 week(s).   Specialty:  Family Medicine Why:  Repeat BMP lab and check on blood pressure  Contact information: [address] [place] [address] [phone]          Allergies  Allergen Reactions  . Amlodipine Besylate     REACTION: dizzy  . Codeine  Phosphate     headache  . Lipitor [Atorvastatin Calcium]     Muscle pain and tiredness  . Sulfonamide Derivatives     REACTION: unspecified    Consultations:  None   Procedures/Studies: Ct Head Wo Contrast  Result Date: 02/23/2017 CLINICAL DATA:  Headache with nausea EXAM: CT HEAD WITHOUT CONTRAST TECHNIQUE: Contiguous axial images were obtained from the base of the skull through the vertex without intravenous contrast. COMPARISON:  Brain MRI 03/04/2015, CT brain 01/28/2015 FINDINGS: Brain: No evidence of acute infarction, hemorrhage, hydrocephalus, extra-axial collection or mass lesion/mass effect. Vascular: No hyperdense vessels.  Carotid artery calcifications. Skull: Normal. Negative for fracture or focal lesion. Sinuses/Orbits: Mucosal thickening in the sphenoid, and maxillary sinus. No acute orbital abnormality. Other: None IMPRESSION: Negative.  No CT evidence for acute intracranial abnormality. Electronically Signed   By: Jasmine Pang M.D.   On: 02/23/2017 22:06   Xr C-arm No Report  Result Date: 02/22/2017 Please see Notes or Procedures tab for imaging  impression.  Xr Hip Unilat W Or W/o Pelvis 1v Left  Result Date: 02/23/2017 AP of pelvis and left hip x-ray shows prior lumbar instrumented fusion at L3-4 and L4-5 with some lumbar scoliosis mild and slightly lower left ilium height compared to right.  The sacroiliac joints do not show a great deal of sclerosis.  Both the right and left hip show some periarticular osteophytes but is clearly more left than right.  Appears to be at least moderate arthritis on the left.     Discharge Exam: Vitals:   02/26/17 0800 02/26/17 0940  BP: (!) 179/135 (!) 156/70  Pulse: 73 70  Resp: 14 15  Temp:    SpO2: 96% 96%   Vitals:   02/26/17 0519 02/26/17 0730 02/26/17 0800 02/26/17 0940  BP: (!) 198/92 (!) 148/80 (!) 179/135 (!) 156/70  Pulse:  72 73 70  Resp:  17 14 15   Temp:  98.6 F (37 C)    TempSrc:  Oral    SpO2:  96% 96% 96%  Weight:      Height:        General: Pt is alert, awake, not in acute distress Cardiovascular: RRR, S1/S2 +, no rubs, no gallops Respiratory: CTA bilaterally, no wheezing, no rhonchi Abdominal: Soft, NT, ND, bowel sounds + Extremities: no edema, no cyanosis    The results of significant diagnostics from this hospitalization (including imaging, microbiology, ancillary and laboratory) are listed below for reference.     Microbiology: Recent Results (from the past 240 hour(s))  CSF culture     Status: None (Preliminary result)   Collection Time: 02/23/17 10:40 PM  Result Value Ref Range Status   Specimen Description CSF  Final   Special Requests NONE  Final   Gram Stain   Final    WBC PRESENT, PREDOMINANTLY MONONUCLEAR NO ORGANISMS SEEN CYTOSPIN SMEAR    Culture NO GROWTH 2 DAYS  Final   Report Status PENDING  Incomplete  MRSA PCR Screening     Status: None   Collection Time: 02/24/17  4:28 PM  Result Value Ref Range Status   MRSA by PCR NEGATIVE NEGATIVE Final    Comment:        The GeneXpert MRSA Assay (FDA approved for NASAL specimens only), is  one component of a comprehensive MRSA colonization surveillance program. It is not intended to diagnose MRSA infection nor to guide or monitor treatment for MRSA infections.      Labs: BNP (last 3 results) No  results for input(s): BNP in the last 8760 hours. Basic Metabolic Panel: Recent Labs  Lab 02/24/17 2206 02/25/17 0702 02/25/17 1127 02/25/17 1525 02/26/17 0815  NA 124* 127* 127* 128* 131*  K 3.9 3.8 3.8 3.8 4.1  CL 95* 96* 99* 100* 104  CO2 18* 20* 20* 19* 20*  GLUCOSE 142* 128* 124* 168* 121*  BUN 20 23* 23* 22* 24*  CREATININE 0.89 0.97 0.95 1.07* 0.99  CALCIUM 9.0 9.1 8.8* 8.9 8.9   Liver Function Tests: No results for input(s): AST, ALT, ALKPHOS, BILITOT, PROT, ALBUMIN in the last 168 hours. No results for input(s): LIPASE, AMYLASE in the last 168 hours. No results for input(s): AMMONIA in the last 168 hours. CBC: Recent Labs  Lab 02/23/17 2127 02/23/17 2137 02/24/17 0359 02/25/17 0702  WBC 9.1  --  11.1* 10.2  NEUTROABS 8.2*  --  10.0*  --   HGB 14.1 14.3 13.1 12.5  HCT 39.4 42.0 37.3 36.0  MCV 91.8  --  91.4 91.8  PLT PLATELET CLUMPS NOTED ON SMEAR, COUNT APPEARS ADEQUATE  --  258 229   Cardiac Enzymes: No results for input(s): CKTOTAL, CKMB, CKMBINDEX, TROPONINI in the last 168 hours. BNP: Invalid input(s): POCBNP CBG: Recent Labs  Lab 02/24/17 0813 02/26/17 0854  GLUCAP 145* 125*   D-Dimer No results for input(s): DDIMER in the last 72 hours. Hgb A1c No results for input(s): HGBA1C in the last 72 hours. Lipid Profile No results for input(s): CHOL, HDL, LDLCALC, TRIG, CHOLHDL, LDLDIRECT in the last 72 hours. Thyroid function studies No results for input(s): TSH, T4TOTAL, T3FREE, THYROIDAB in the last 72 hours.  Invalid input(s): FREET3 Anemia work up No results for input(s): VITAMINB12, FOLATE, FERRITIN, TIBC, IRON, RETICCTPCT in the last 72 hours. Urinalysis    Component Value Date/Time   COLORURINE YELLOW 03/04/2015 1427    APPEARANCEUR CLEAR 03/04/2015 1427   LABSPEC 1.009 03/04/2015 1427   PHURINE 8.0 03/04/2015 1427   GLUCOSEU NEGATIVE 03/04/2015 1427   HGBUR NEGATIVE 03/04/2015 1427   BILIRUBINUR NEGATIVE 03/04/2015 1427   KETONESUR NEGATIVE 03/04/2015 1427   PROTEINUR 100 (A) 03/04/2015 1427   UROBILINOGEN 0.2 08/05/2008 1450   NITRITE NEGATIVE 03/04/2015 1427   LEUKOCYTESUR NEGATIVE 03/04/2015 1427   Sepsis Labs Invalid input(s): PROCALCITONIN,  WBC,  LACTICIDVEN Microbiology Recent Results (from the past 240 hour(s))  CSF culture     Status: None (Preliminary result)   Collection Time: 02/23/17 10:40 PM  Result Value Ref Range Status   Specimen Description CSF  Final   Special Requests NONE  Final   Gram Stain   Final    WBC PRESENT, PREDOMINANTLY MONONUCLEAR NO ORGANISMS SEEN CYTOSPIN SMEAR    Culture NO GROWTH 2 DAYS  Final   Report Status PENDING  Incomplete  MRSA PCR Screening     Status: None   Collection Time: 02/24/17  4:28 PM  Result Value Ref Range Status   MRSA by PCR NEGATIVE NEGATIVE Final    Comment:        The GeneXpert MRSA Assay (FDA approved for NASAL specimens only), is one component of a comprehensive MRSA colonization surveillance program. It is not intended to diagnose MRSA infection nor to guide or monitor treatment for MRSA infections.      Time coordinating discharge: 30 minutes  SIGNED:  Noralee StainJennifer Baird Polinski, DO [facility] Pager [phone]  If 7PM-7AM, please contact night-coverage [email or web address] Password [email or web address] 02/26/2017, 10:32 AM

## 2017-02-27 LAB — CSF CULTURE W GRAM STAIN: Culture: NO GROWTH

## 2017-02-27 LAB — CSF CULTURE

## 2017-03-01 ENCOUNTER — Ambulatory Visit: Payer: Medicare Other | Admitting: Cardiology

## 2017-03-01 ENCOUNTER — Encounter: Payer: Self-pay | Admitting: Cardiology

## 2017-03-01 VITALS — BP 146/84 | HR 61 | Ht 64.0 in | Wt 160.4 lb

## 2017-03-01 DIAGNOSIS — I10 Essential (primary) hypertension: Secondary | ICD-10-CM

## 2017-03-01 DIAGNOSIS — I480 Paroxysmal atrial fibrillation: Secondary | ICD-10-CM | POA: Diagnosis not present

## 2017-03-01 DIAGNOSIS — I493 Ventricular premature depolarization: Secondary | ICD-10-CM | POA: Diagnosis not present

## 2017-03-01 DIAGNOSIS — N183 Chronic kidney disease, stage 3 unspecified: Secondary | ICD-10-CM

## 2017-03-01 NOTE — Progress Notes (Signed)
[address] [place], [place]  [address] Phone: [phone] Fax:  [phone]  Date:  03/01/2017   ID:  Cheryl [name], DOB 1934/07/01, MRN [id number]  PCP:  Laurann Montana, MD   History of Present Illness: Cheryl [name] is a 83 y.o. female (pronounced Lie na) with atrial fibrillation currently on antiarrhythmic flecainide here for followup. She had not currently been on anticoagulation despite extensive counseling on risk of stroke. She refused.  Likely familial hyperlipidemia, LDL 211 in September of 2013. Does not wish to take statin medication.  Nor does she want to take chronic anticoagulation. Lengthy discussion.  back surgery in July 2015, fusion. No afib.  She will occasionally take an extra flecainide to help her with racing heartbeat. This seems to work well for her. Continue.  Her husband has been diagnosed with Alzheimer's.  03/01/17-reviewed her recent hospitalization January 2019 where sodium was 122, now 131.  Off HCTZ.  No chest pain fevers chills nausea vomiting shortness of breath.  Wt Readings from Last 3 Encounters:  03/01/17 160 lb 6.4 oz (72.8 kg)  02/26/17 152 lb 16 oz (69.4 kg)  02/17/16 150 lb 12.8 oz (68.4 kg)     Past Medical History:  Diagnosis Date  . Arthritis   . CAD (coronary artery disease)    Mild per remote cath in 2004; normal nuclear in 2012  . Chronic kidney disease    stage3 kidney disease   . Complication of anesthesia    slow to wake up  . Diastolic dysfunction    Grade I, per echo in 2012  . Dysrhythmia   . GERD (gastroesophageal reflux disease)    prevacid  . H/O hiatal hernia   . Headache(784.0)    sinus  . High risk medication use    Flecainide therapy  . History of heartburn   . Hyperlipidemia   . Hypertension   . LVH (left ventricular hypertrophy)    Per echo in July 2012  . Normal nuclear stress test July 2012  . Obesity   . PAF (paroxysmal atrial fibrillation) (HCC)   . Pneumonia    20 yrs  ago  . Syncope July 2012    Past Surgical History:  Procedure Laterality Date  . APPENDECTOMY  1952  . BACK SURGERY  1980, 2010   x2  . CARDIAC CATHETERIZATION  07/28/2002   EF GREATER THAN 55%; MILD CAD  . COLONOSCOPY    . DILATION AND CURETTAGE OF UTERUS    . LUMBAR LAMINECTOMY/DECOMPRESSION MICRODISCECTOMY Right 02/15/2015   Procedure: Right Thoracic twelve-Lumbar one  Microdiskectomy;  Surgeon: Tressie Stalker, MD;  Location: [facility];  Service: Neurosurgery;  Laterality: Right;  Thoracic/Lumbar  . TONSILLECTOMY     age 58 yrs  . TUBAL LIGATION      Current Outpatient Medications  Medication Sig Dispense Refill  . acetaminophen (TYLENOL) 500 MG tablet Take 500-1,000 mg by mouth every 6 (six) hours as needed for mild pain.    Marland Kitchen aspirin EC 81 MG tablet Take 81 mg by mouth daily.    . Biotin 10 MG CAPS Take 10 mg by mouth daily.    . Cholecalciferol (VITAMIN D) 2000 UNITS tablet Take 2,000 Units by mouth daily.      . cloNIDine (CATAPRES) 0.1 MG tablet Take 1 tablet (0.1 mg total) by mouth daily. 30 tablet 0  . docusate sodium (COLACE) 100 MG capsule Take 1 capsule (100 mg total) by mouth  2 (two) times daily. (Patient taking differently: Take 100 mg by mouth 2 (two) times daily as needed for mild constipation. ) 60 capsule 0  . flecainide (TAMBOCOR) 50 MG tablet Take 1 tablet (50 mg total) by mouth daily. 90 tablet 3  . hydrALAZINE (APRESOLINE) 25 MG tablet Take 1 tablet (25 mg total) by mouth every 8 (eight) hours. 90 tablet 0  . ibuprofen (ADVIL,MOTRIN) 200 MG tablet Take 400 mg by mouth every 6 (six) hours as needed for moderate pain.    Marland Kitchen. lisinopril (PRINIVIL,ZESTRIL) 20 MG tablet Take 1 tablet (20 mg total) by mouth daily. 30 tablet 0  . loratadine (CLARITIN) 10 MG tablet Take 10 mg by mouth daily as needed for allergies.    . metoprolol succinate (TOPROL-XL) 50 MG 24 hr tablet Take 1 tablet (50 mg total) by mouth daily. 30 tablet 0  . traZODone (DESYREL) 100 MG tablet Take  100 mg by mouth at bedtime.       No current facility-administered medications for this visit.     Allergies:    Allergies  Allergen Reactions  . Amlodipine Besylate     REACTION: dizzy  . Codeine Phosphate     headache  . Lipitor [Atorvastatin Calcium]     Muscle pain and tiredness  . Sulfonamide Derivatives     REACTION: unspecified    Social History:  The patient  reports that  has never smoked. she has never used smokeless tobacco. She reports that she does not drink alcohol or use drugs.   ROS:  Please see the history of present illness.  All other ROS negative PHYSICAL EXAM: VS:  BP (!) 146/84   Pulse 61   Ht 5\' 4"  (1.626 m)   Wt 160 lb 6.4 oz (72.8 kg)   SpO2 98%   BMI 27.53 kg/m  GEN: Well nourished, well developed, in no acute distress  HEENT: normal  Neck: no JVD, carotid bruits, or masses Cardiac: RRR; no murmurs, rubs, or gallops,no edema  Respiratory:  clear to auscultation bilaterally, normal work of breathing GI: soft, nontender, nondistended, + BS MS: no deformity or atrophy  Skin: warm and dry, no rash Neuro:  Alert and Oriented x 3, Strength and sensation are intact Psych: euthymic mood, full affect   EKG:  EKG ordered 03/01/17-sinus rhythm 61 with left anterior fascicular block and nonspecific ST-T wave changes.  02/17/16-sinus rhythm, PACs. Left anterior fascicular block. Sinus rhythm left anterior fascicular block, PAC. No evidence of atrial fibrillation.     Echocardiogram 08/30/2010  - Left ventricle: The cavity size was normal. Wall thickness was increased in a pattern of moderate LVH. Systolic function was normal. The estimated ejection fraction was in the range of 55% to 65%. Wall motion was normal; there were no regional wall motion abnormalities. Doppler parameters are consistent with abnormal left ventricular relaxation (grade 1 diastolic dysfunction). - Mitral valve: There was systolic anterior motion. Mild regurgitation. -  Atrial septum: No defect or patent foramen ovale was identified. - Pulmonary arteries: PA peak pressure: 32mm Hg (S).  ASSESSMENT AND PLAN:  1. Paroxysmal atrial fibrillation-previously expressed the importance of anticoagulation. She has been informed about stroke risk. CHADS-VAS -3. She states that she worked so hard to get off of warfarin previously. I explained the benefits of the NOAC. She clearly refuses to take anticoagulation. If she changes her mind, she can always contact me. Continue with flecainide for now (unconventional dose once a day). Likes to take it more in  the night. Did not do well with twice a day.  Taking Toprol. She states that she has been pill bottle if her heart rate increases. Occasional PACs noted on EKG. 2. Hypertension-can not take hydralazine. Felt tightness in chest. STOP hydralazine.  BP minimally elevated today.  Encouraged low sodium diet. Exercise. Medication.  3. Hyponatremia-127 in the hospital setting, currently 131.  Dr. Cliffton Asters will be checking soon. 4. Hyperlipidemia-currently taking fish oil. LDL cholesterol was 211 on 09/30/12. Strongly encouraged statin therapy. Not interested currently. Understands risks, myocardial infarction, stroke. Likely familial hyperlipidemia. 5. Chronic kidney disease stage III-GFR in the 50 range. Likely secondary to hypertension. She was quite concerned about this. Discussed the importance of controlling blood pressure. Exercise. Avoiding NSAIDs. 6. Pulmonary hypertension-seen on echocardiogram, 62 mmHg.  CT scan of chest showed no evidence of thromboembolic disease.  She is not short of breath currently.  Continue to treat underlying hypertension. 7. We will see back in 1 year.  Signed, Donato Schultz, MD [facility]  03/01/2017 2:59 PM

## 2017-03-01 NOTE — Patient Instructions (Signed)
Medication Instructions:  Your physician has recommended you make the following change in your medication:   STOP: Hydralazine  Labwork: None ordered  Testing/Procedures: None ordered  Follow-Up: Your physician wants you to follow-up in: 1 year with Dr. Anne FuSkains. You will receive a reminder letter in the mail two months in advance. If you don't receive a letter, please call our office to schedule the follow-up appointment.   Any Other Special Instructions Will Be Listed Below (If Applicable).     If you need a refill on your cardiac medications before your next appointment, please call your pharmacy.

## 2017-03-07 ENCOUNTER — Other Ambulatory Visit: Payer: Self-pay | Admitting: Cardiology

## 2017-03-09 ENCOUNTER — Telehealth (INDEPENDENT_AMBULATORY_CARE_PROVIDER_SITE_OTHER): Payer: Self-pay | Admitting: Physical Medicine and Rehabilitation

## 2017-03-09 DIAGNOSIS — M5416 Radiculopathy, lumbar region: Secondary | ICD-10-CM

## 2017-03-12 NOTE — Telephone Encounter (Signed)
Order placed and patient notified 

## 2017-03-12 NOTE — Telephone Encounter (Signed)
ok 

## 2017-04-04 ENCOUNTER — Ambulatory Visit
Admission: RE | Admit: 2017-04-04 | Discharge: 2017-04-04 | Disposition: A | Discharge status: Home / Self Care | Payer: Medicare Other | Source: Ambulatory Visit | Attending: Physical Medicine and Rehabilitation | Admitting: Physical Medicine and Rehabilitation

## 2017-04-04 DIAGNOSIS — M5416 Radiculopathy, lumbar region: Secondary | ICD-10-CM

## 2017-04-05 ENCOUNTER — Ambulatory Visit (INDEPENDENT_AMBULATORY_CARE_PROVIDER_SITE_OTHER): Payer: Medicare Other | Admitting: Physical Medicine and Rehabilitation

## 2017-04-05 ENCOUNTER — Encounter (INDEPENDENT_AMBULATORY_CARE_PROVIDER_SITE_OTHER): Payer: Self-pay | Admitting: Physical Medicine and Rehabilitation

## 2017-04-05 VITALS — BP 165/95 | HR 54 | Temp 97.9°F

## 2017-04-05 DIAGNOSIS — M961 Postlaminectomy syndrome, not elsewhere classified: Secondary | ICD-10-CM

## 2017-04-05 DIAGNOSIS — M5416 Radiculopathy, lumbar region: Secondary | ICD-10-CM

## 2017-04-05 DIAGNOSIS — M419 Scoliosis, unspecified: Secondary | ICD-10-CM

## 2017-04-05 DIAGNOSIS — M25552 Pain in left hip: Secondary | ICD-10-CM

## 2017-04-05 NOTE — Progress Notes (Signed)
Cheryl [name] - 83 y.o. female MRN [id number]  Date of birth: 03/10/1934  Office Visit Note: Visit Date: 04/05/2017 PCP: Laurann Montana, MD Referred by: Laurann Montana, MD  Subjective: Chief Complaint  Patient presents with  . Lower Back - Pain  . Left Leg - Pain   HPI: Cheryl [name] is an 83 year old female with prior history of 4 lumbar surgeries with lumbar fusion from L3-L4.  We have completed over the course of the last year 3 injections 1 of which was a hip injection performed on February 22, 2017.  The patient unfortunately the next day was having severe vomiting and hypertension episode where she did not come to the emergency department.  She was treated for hyponatremia as well as hypertension.  She reported at the time the injection did seem to help temporarily in her hip.  Reviewing the images show well-placed hip injection with no extravasation of joint fluid or contrast.  She did recover from that episode and has followed up with her cardiologist for paroxysmal atrial fibrillation.  She comes in today after having had MRI of the lumbar spine performed and this is reviewed with her today.  She had prior MRI completed after some early spine surgery but none after her most significant spine surgery which was fusion and then T12 disc herniation discectomy.  She comes in today with left hip and leg pain.  Pain is worse in the left posterior lateral hip and then sometimes referral into the leg itself.  She denies any numbness tingling or paresthesias.  Is worse when she tries to stand and ambulate.  She denies any specific groin pain.  She denies any right-sided complaints at this point.  She is very stiff through the lumbar spine.  Again standing makes the pain worse and pain medications do take the edge off.  She has not had recent physical therapy but has had physical therapy in the past at [facility].  She has not had recent epidural injection although one was performed back in November.  This  was an S1 transforaminal injection.    Review of Systems  Constitutional: Negative for chills, fever, malaise/fatigue and weight loss.  HENT: Negative for hearing loss and sinus pain.   Eyes: Negative for blurred vision, double vision and photophobia.  Respiratory: Negative for cough and shortness of breath.   Cardiovascular: Negative for chest pain, palpitations and leg swelling.  Gastrointestinal: Negative for abdominal pain, nausea and vomiting.  Genitourinary: Negative for flank pain.  Musculoskeletal: Positive for back pain and joint pain. Negative for myalgias.  Skin: Negative for itching and rash.  Neurological: Negative for tremors, focal weakness and weakness.  Endo/Heme/Allergies: Negative.   Psychiatric/Behavioral: Negative for depression.  All other systems reviewed and are negative.  Otherwise per HPI.  Assessment & Plan: Visit Diagnoses:  1. Radiculopathy, lumbar region   2. Pain in left hip   3. Post laminectomy syndrome   4. Scoliosis of lumbar spine, unspecified scoliosis type     Plan: Findings:  Complicated low back and left hip pain in a setting of 4 prior lumbar surgeries including lumbar fusion as well as upper lumbar discectomy.  New MRI does show disc osteophyte complex at L5-S1 on the left which could affect the L5 or S1 nerve root.  From a procedural standpoint the L5 vertebral body is very low seated between the iliac crest to get into the L5 foramen is very difficult.  She does not have any groin pain or  pain on rotation.  She also has some changes above the fusion which are mainly facet arthropathy and degenerative disc changes.  She has no high-grade central stenosis.  We had a long discussion today about medications and physical therapy and injections.  She does want to proceed with S1 or L5 transforaminal injection to see if it will get her some relief.  She also wants to consider physical therapy at some point regrouping without depending on how the shot  does.  Her pain is really been recalcitrant any interventions recently and she has had different complications.  We discussed at length today spinal cord stimulator trials and the benefits and perils of that.  I think she would be a good candidate for this.  She is 83 years old but still is very active and still doing a lot of things and I think she would do well with the stimulator trial.  At this point I did give her some information to look at on that.    Meds & Orders: No orders of the defined types were placed in this encounter.  No orders of the defined types were placed in this encounter.   Follow-up: Return for Left L5 or S1 transforaminal epidural steroid injection..   Procedures: No procedures performed  No notes on file   Clinical History: No specialty comments available.  She reports that  has never smoked. she has never used smokeless tobacco. No results for input(s): HGBA1C, LABURIC in the last 8760 hours.  Objective:  VS:  HT:    WT:   BMI:     BP:(!) 165/95  HR:(!) 54bpm  TEMP:97.9 F (36.6 C)(Oral)  RESP:  Physical Exam  Constitutional: She is oriented to person, place, and time. She appears well-developed and well-nourished. No distress.  HENT:  Head: Normocephalic and atraumatic.  Nose: Nose normal.  Mouth/Throat: Oropharynx is clear and moist.  Eyes: Conjunctivae are normal. Pupils are equal, round, and reactive to light.  Neck: Normal range of motion. Neck supple.  Cardiovascular: Regular rhythm and intact distal pulses.  Pulmonary/Chest: Effort normal. No respiratory distress.  Abdominal: She exhibits no distension. There is no guarding.  Musculoskeletal:  Patient is very slow to rise from a seated position.  She has pain with extension of the lumbar spine.  She has good distal strength without clonus.  Neurological: She is alert and oriented to person, place, and time. She exhibits normal muscle tone. Coordination normal.  Skin: Skin is warm. No rash  noted. No erythema.  Psychiatric: Her behavior is normal.  Nursing note and vitals reviewed.   Ortho Exam Imaging: Mr Lumbar Spine Wo Contrast  Result Date: 04/04/2017 CLINICAL DATA:  Initial evaluation for 6 week history of left lower back pain with left sciatica. EXAM: MRI LUMBAR SPINE WITHOUT CONTRAST TECHNIQUE: Multiplanar, multisequence MR imaging of the lumbar spine was performed. No intravenous contrast was administered. COMPARISON:  Prior MRI from 03/19/2015. FINDINGS: Segmentation: Normal segmentation. Lowest well-formed disc labeled the L5-S1 level. Same numbering system employed as on previous exams. Alignment: Stable levoscoliosis. Unchanged 3 mm retrolisthesis of T12 on L1, with 3 mm anterolisthesis of L3 on L4. Vertebrae: Vertebral body heights are maintained without evidence for acute or interval fracture. Susceptibility artifact from prior fusion at L3-4 through L5-S1. Multilevel reactive endplate changes seen throughout the lumbar spine, most notable at the T12-L1, L1-2, and L5-S1 levels. Underlying bone marrow signal intensity within normal limits. Subcentimeter benign hemangioma noted within the T11 vertebral body. No worrisome osseous  lesions. Conus medullaris and cauda equina: Conus extends to the L1-2 level. Conus and cauda equina appear normal. Paraspinal and other soft tissues: Paraspinous soft tissues demonstrate no acute abnormality. Visualized visceral structures within normal limits. Disc levels: T12-L1: 3 mm retrolisthesis. Progressive intervertebral disc space narrowing with diffuse disc bulge with chronic reactive endplate changes and resultant disc osteophyte slightly eccentric to the right. Moderate facet and ligament flavum hypertrophy. Moderate to advanced right foraminal stenosis is relatively similar to previous. Slightly progressive moderate spinal stenosis with lateral recess narrowing. L1-2: Diffuse disc bulge with disc desiccation and intervertebral disc space  narrowing. Reactive endplate changes with marginal endplate osteophytic spurring. Bilateral facet hypertrophy, greater on the left. Probable remote postsurgical changes at this level. Stable mild canal with moderate left lateral recess narrowing. Mild bilateral foraminal stenosis, right greater than left, also stable. L2-3: Diffuse disc bulge with disc desiccation and intervertebral disc space narrowing, progressed from previous. Mild reactive marginal endplate spurring. Moderate facet and ligamentum flavum hypertrophy. Mild canal and bilateral foraminal stenosis relatively similar. L3-4: Postoperative changes from remote PLIF. No residual or recurrent canal or foraminal stenosis. L4-5: Remote postoperative changes from previous PLIF. No residual or recurrent canal or foraminal stenosis. L5-S1: Chronic intervertebral disc space narrowing with diffuse disc bulge and disc desiccation. Reactive endplate changes with marginal endplate osteophytic spurring. Remote right-sided laminotomy defect. Moderate facet hypertrophy. Resultant moderate lateral recess narrowing bilaterally, similar to previous. Moderate to severe left foraminal stenosis appears progressed from previous, with possible L5 nerve root impingement. Similar moderate right L5 foraminal narrowing. IMPRESSION: 1. Multifactorial degenerative changes at L5-S1 with resultant moderate to severe left L5 foraminal stenosis, slightly progressed from previous. 2. Progressive degenerative spondylolysis at T12-L1 with slightly worsened moderate spinal stenosis with similar moderate to advanced right foraminal narrowing. 3. Stable chronic degenerative disc disease and facet hypertrophy at L1-2 and L2-3 with resultant lateral recess and foraminal narrowing as above. Electronically Signed   By: Rise MuBenjamin  McClintock M.D.   On: 04/04/2017 17:20    Past Medical/Family/Surgical/Social History: Medications & Allergies reviewed per EMR Patient Active Problem List    Diagnosis Date Noted  . Headache 02/23/2017  . Nausea & vomiting 02/23/2017  . Hyponatremia 02/23/2017  . Hypertensive urgency 02/23/2017  . Lumbar herniated disc 02/15/2015  . CKD (chronic kidney disease) stage 3, GFR 30-59 ml/min (HCC) 06/22/2014  . Spondylolisthesis of lumbar region 08/07/2013  . Hyperlipidemia 06/13/2008  . PAF (paroxysmal atrial fibrillation) (HCC) 06/13/2008  . Essential hypertension 11/05/2006  . Coronary atherosclerosis 11/05/2006   Past Medical History:  Diagnosis Date  . Arthritis   . CAD (coronary artery disease)    Mild per remote cath in 2004; normal nuclear in 2012  . Chronic kidney disease    stage3 kidney disease   . Complication of anesthesia    slow to wake up  . Diastolic dysfunction    Grade I, per echo in 2012  . Dysrhythmia   . GERD (gastroesophageal reflux disease)    prevacid  . H/O hiatal hernia   . Headache(784.0)    sinus  . High risk medication use    Flecainide therapy  . History of heartburn   . Hyperlipidemia   . Hypertension   . LVH (left ventricular hypertrophy)    Per echo in July 2012  . Normal nuclear stress test July 2012  . Obesity   . PAF (paroxysmal atrial fibrillation) (HCC)   . Pneumonia    20 yrs ago  . Syncope July 2012  Family History  Problem Relation Age of Onset  . Lung cancer Father    Past Surgical History:  Procedure Laterality Date  . APPENDECTOMY  1952  . BACK SURGERY  1980, 2010   x2  . CARDIAC CATHETERIZATION  07/28/2002   EF GREATER THAN 55%; MILD CAD  . COLONOSCOPY    . DILATION AND CURETTAGE OF UTERUS    . LUMBAR LAMINECTOMY/DECOMPRESSION MICRODISCECTOMY Right 02/15/2015   Procedure: Right Thoracic twelve-Lumbar one  Microdiskectomy;  Surgeon: Tressie Stalker, MD;  Location: [facility];  Service: Neurosurgery;  Laterality: Right;  Thoracic/Lumbar  . TONSILLECTOMY     age 88 yrs  . TUBAL LIGATION     Social History   Occupational History  . Not on file  Tobacco Use  . Smoking  status: Never Smoker  . Smokeless tobacco: Never Used  Substance and Sexual Activity  . Alcohol use: No  . Drug use: No  . Sexual activity: Not on file

## 2017-04-05 NOTE — Progress Notes (Deleted)
Pt states pain in her left lower back that radiated through the left leg. Pt states pain has always been there. Pt states standing makes it worse, pain meds takes the edge off.

## 2017-04-11 ENCOUNTER — Ambulatory Visit (INDEPENDENT_AMBULATORY_CARE_PROVIDER_SITE_OTHER): Payer: Medicare Other | Admitting: Physical Medicine and Rehabilitation

## 2017-04-11 ENCOUNTER — Ambulatory Visit (INDEPENDENT_AMBULATORY_CARE_PROVIDER_SITE_OTHER): Payer: Medicare Other

## 2017-04-11 ENCOUNTER — Encounter (INDEPENDENT_AMBULATORY_CARE_PROVIDER_SITE_OTHER): Payer: Self-pay | Admitting: Physical Medicine and Rehabilitation

## 2017-04-11 VITALS — BP 191/94 | HR 59 | Temp 97.9°F

## 2017-04-11 DIAGNOSIS — M5416 Radiculopathy, lumbar region: Secondary | ICD-10-CM | POA: Diagnosis not present

## 2017-04-11 MED ORDER — BETAMETHASONE SOD PHOS & ACET 6 (3-3) MG/ML IJ SUSP
12.0000 mg | Freq: Once | INTRAMUSCULAR | Status: AC
  Administered 2017-04-11: 12 mg

## 2017-04-11 NOTE — Progress Notes (Signed)
.  Numeric Pain Rating Scale and Functional Assessment Average Pain 9   In the last MONTH (on 0-10 scale) has pain interfered with the following?  1. General activity like being  able to carry out your everyday physical activities such as walking, climbing stairs, carrying groceries, or moving a chair?  Rating(3)   +Driver, -BT, -Dye Allergies.  

## 2017-04-11 NOTE — Patient Instructions (Signed)

## 2017-04-12 ENCOUNTER — Telehealth (INDEPENDENT_AMBULATORY_CARE_PROVIDER_SITE_OTHER): Payer: Self-pay | Admitting: Physical Medicine and Rehabilitation

## 2017-04-12 NOTE — Telephone Encounter (Signed)
Called patient to advise. She does not think she is taking the hydrochlorothiazide, and she dos have hydrocodone. I advised her that she may need to got to the ED if her symptoms continue or worsen.

## 2017-04-12 NOTE — Telephone Encounter (Signed)
I am not sure why the cortizone would cause such changes that fast with sodium etc. She should hold hydrochlorothiazide if taking, she can drink fluids with some sodium such as gatorade/soup. She needs to check blood pressure and make sure not significantly increased but seems like any more cortizone is out of the question. The last injection was a hip injection several days before the episode. We did discuss this back before the new MRI.  Does she have any pain medication, if not we can try hydrocodone if no allergy/intolerance. Otherwise she may need to go to ED.

## 2017-04-12 NOTE — Procedures (Signed)
Lumbosacral Transforaminal Epidural Steroid Injection - Sub-Pedicular Approach with Fluoroscopic Guidance  Patient: Cheryl [name]      Date of Birth: June 01, 1934 MRN: [id number] PCP: Laurann MontanaWhite, Cynthia, MD      Visit Date: 04/11/2017   Universal Protocol:    Date/Time: 04/11/2017  Consent Given By: the patient  Position: PRONE  Additional Comments: Vital signs were monitored before and after the procedure. Patient was prepped and draped in the usual sterile fashion. The correct patient, procedure, and site was verified.   Injection Procedure Details:  Procedure Site One Meds Administered:  Meds ordered this encounter  Medications  . betamethasone acetate-betamethasone sodium phosphate (CELESTONE) injection 12 mg    Laterality: Left  Location/Site:  L5-S1  Needle size: 22 G  Needle type: Spinal  Needle Placement: Transforaminal  Findings:    -Comments: Excellent flow of contrast along the nerve and into the epidural space.  Procedure Details: After squaring off the end-plates to get a true AP view, the C-arm was positioned so that an oblique view of the foramen as noted above was visualized. The target area is just inferior to the "nose of the scotty dog" or sub pedicular. The soft tissues overlying this structure were infiltrated with 2-3 ml. of 1% Lidocaine without Epinephrine.  The spinal needle was inserted toward the target using a "trajectory" view along the fluoroscope beam.  Under AP and lateral visualization, the needle was advanced so it did not puncture dura and was located close the 6 O'Clock position of the pedical in AP tracterory. Biplanar projections were used to confirm position. Aspiration was confirmed to be negative for CSF and/or blood. A 1-2 ml. volume of Isovue-250 was injected and flow of contrast was noted at each level. Radiographs were obtained for documentation purposes.   After attaining the desired flow of contrast documented above, a 0.5 to 1.0  ml test dose of 0.25% Marcaine was injected into each respective transforaminal space.  The patient was observed for 90 seconds post injection.  After no sensory deficits were reported, and normal lower extremity motor function was noted,   the above injectate was administered so that equal amounts of the injectate were placed at each foramen (level) into the transforaminal epidural space.   Additional Comments:  The patient tolerated the procedure well Dressing: Band-Aid    Post-procedure details: Patient was observed during the procedure. Post-procedure instructions were reviewed.  Patient left the clinic in stable condition.

## 2017-04-12 NOTE — Progress Notes (Signed)
Cheryl [name] - 83 y.o. female MRN [id number]  Date of birth: 1934-04-26  Office VisIvery Qualeit Note: Visit Date: 04/11/2017 PCP: Laurann MontanaWhite, Cynthia, MD Referred by: Laurann MontanaWhite, Cynthia, MD  Subjective: Chief Complaint  Patient presents with  . Lower Back - Pain  . Left Leg - Pain   HPI: Cheryl [name] is an 83 year old female well-known to me with prior lumbar surgeries x4 with lumbar fusion down to L5.  Since January she has had ongoing left buttock and leg pain in L5 distribution.  Updated MRI has been reviewed with the patient in a prior visit.  She comes in today for planned left L5 transforaminal epidural steroid injection.  The injection  will be diagnostic and hopefully therapeutic. The patient has failed conservative care including time, medications and activity modification.    ROS Otherwise per HPI.  Assessment & Plan: Visit Diagnoses:  1. Radiculopathy, lumbar region     Plan: No additional findings.   Meds & Orders:  Meds ordered this encounter  Medications  . betamethasone acetate-betamethasone sodium phosphate (CELESTONE) injection 12 mg    Orders Placed This Encounter  Procedures  . XR C-ARM NO REPORT  . Epidural Steroid injection    Follow-up: Return if symptoms worsen or fail to improve.   Procedures: No procedures performed  Lumbosacral Transforaminal Epidural Steroid Injection - Sub-Pedicular Approach with Fluoroscopic Guidance  Patient: Cheryl [name]      Date of Birth: 1934-04-26 MRN: [id number] PCP: Laurann MontanaWhite, Cynthia, MD      Visit Date: 04/11/2017   Universal Protocol:    Date/Time: 04/11/2017  Consent Given By: the patient  Position: PRONE  Additional Comments: Vital signs were monitored before and after the procedure. Patient was prepped and draped in the usual sterile fashion. The correct patient, procedure, and site was verified.   Injection Procedure Details:  Procedure Site One Meds Administered:  Meds ordered this encounter  Medications  .  betamethasone acetate-betamethasone sodium phosphate (CELESTONE) injection 12 mg    Laterality: Left  Location/Site:  L5-S1  Needle size: 22 G  Needle type: Spinal  Needle Placement: Transforaminal  Findings:    -Comments: Excellent flow of contrast along the nerve and into the epidural space.  Procedure Details: After squaring off the end-plates to get a true AP view, the C-arm was positioned so that an oblique view of the foramen as noted above was visualized. The target area is just inferior to the "nose of the scotty dog" or sub pedicular. The soft tissues overlying this structure were infiltrated with 2-3 ml. of 1% Lidocaine without Epinephrine.  The spinal needle was inserted toward the target using a "trajectory" view along the fluoroscope beam.  Under AP and lateral visualization, the needle was advanced so it did not puncture dura and was located close the 6 O'Clock position of the pedical in AP tracterory. Biplanar projections were used to confirm position. Aspiration was confirmed to be negative for CSF and/or blood. A 1-2 ml. volume of Isovue-250 was injected and flow of contrast was noted at each level. Radiographs were obtained for documentation purposes.   After attaining the desired flow of contrast documented above, a 0.5 to 1.0 ml test dose of 0.25% Marcaine was injected into each respective transforaminal space.  The patient was observed for 90 seconds post injection.  After no sensory deficits were reported, and normal lower extremity motor function was noted,   the above injectate was administered so that equal amounts of the injectate were placed at  each foramen (level) into the transforaminal epidural space.   Additional Comments:  The patient tolerated the procedure well Dressing: Band-Aid    Post-procedure details: Patient was observed during the procedure. Post-procedure instructions were reviewed.  Patient left the clinic in stable condition.     Clinical History: No specialty comments available.  She reports that  has never smoked. she has never used smokeless tobacco. No results for input(s): HGBA1C, LABURIC in the last 8760 hours.  Objective:  VS:  HT:    WT:   BMI:     BP:(!) 191/94  HR:(!) 59bpm  TEMP:97.9 F (36.6 C)(Oral)  RESP:97 % Physical Exam  Musculoskeletal:  They stand and ambulate with a forward flexed lumbar spine.  There is low back pain with extension of the lumbar spine.  There is good distal strength.    Ortho Exam Imaging: Xr C-arm No Report  Result Date: 04/11/2017 Please see Notes or Procedures tab for imaging impression.   Past Medical/Family/Surgical/Social History: Medications & Allergies reviewed per EMR Patient Active Problem List   Diagnosis Date Noted  . Headache 02/23/2017  . Nausea & vomiting 02/23/2017  . Hyponatremia 02/23/2017  . Hypertensive urgency 02/23/2017  . Lumbar herniated disc 02/15/2015  . CKD (chronic kidney disease) stage 3, GFR 30-59 ml/min (HCC) 06/22/2014  . Spondylolisthesis of lumbar region 08/07/2013  . Hyperlipidemia 06/13/2008  . PAF (paroxysmal atrial fibrillation) (HCC) 06/13/2008  . Essential hypertension 11/05/2006  . Coronary atherosclerosis 11/05/2006   Past Medical History:  Diagnosis Date  . Arthritis   . CAD (coronary artery disease)    Mild per remote cath in 2004; normal nuclear in 2012  . Chronic kidney disease    stage3 kidney disease   . Complication of anesthesia    slow to wake up  . Diastolic dysfunction    Grade I, per echo in 2012  . Dysrhythmia   . GERD (gastroesophageal reflux disease)    prevacid  . H/O hiatal hernia   . Headache(784.0)    sinus  . High risk medication use    Flecainide therapy  . History of heartburn   . Hyperlipidemia   . Hypertension   . LVH (left ventricular hypertrophy)    Per echo in July 2012  . Normal nuclear stress test July 2012  . Obesity   . PAF (paroxysmal atrial fibrillation) (HCC)    . Pneumonia    20 yrs ago  . Syncope July 2012   Family History  Problem Relation Age of Onset  . Lung cancer Father    Past Surgical History:  Procedure Laterality Date  . APPENDECTOMY  1952  . BACK SURGERY  1980, 2010   x2  . CARDIAC CATHETERIZATION  07/28/2002   EF GREATER THAN 55%; MILD CAD  . COLONOSCOPY    . DILATION AND CURETTAGE OF UTERUS    . LUMBAR LAMINECTOMY/DECOMPRESSION MICRODISCECTOMY Right 02/15/2015   Procedure: Right Thoracic twelve-Lumbar one  Microdiskectomy;  Surgeon: Tressie Stalker, MD;  Location: [facility];  Service: Neurosurgery;  Laterality: Right;  Thoracic/Lumbar  . TONSILLECTOMY     age 55 yrs  . TUBAL LIGATION     Social History   Occupational History  . Not on file  Tobacco Use  . Smoking status: Never Smoker  . Smokeless tobacco: Never Used  Substance and Sexual Activity  . Alcohol use: No  . Drug use: No  . Sexual activity: Not on file

## 2017-05-08 ENCOUNTER — Telehealth (INDEPENDENT_AMBULATORY_CARE_PROVIDER_SITE_OTHER): Payer: Self-pay | Admitting: *Deleted

## 2017-05-08 ENCOUNTER — Other Ambulatory Visit (INDEPENDENT_AMBULATORY_CARE_PROVIDER_SITE_OTHER): Payer: Self-pay | Admitting: Physical Medicine and Rehabilitation

## 2017-05-08 DIAGNOSIS — M5416 Radiculopathy, lumbar region: Secondary | ICD-10-CM

## 2017-05-08 DIAGNOSIS — M961 Postlaminectomy syndrome, not elsewhere classified: Secondary | ICD-10-CM

## 2017-05-08 DIAGNOSIS — G894 Chronic pain syndrome: Secondary | ICD-10-CM

## 2017-05-08 NOTE — Progress Notes (Signed)
Referral placed for neuropsychology evaluation for preimplantation and trial of spinal cord stimulator.  Please see prior notes for further justification.

## 2017-05-08 NOTE — Telephone Encounter (Signed)
I sent a [facility] to [facility].  Please tell her that someone from our office should be calling him to schedule.  If for some reason it is very delayed we can look at the folks in [facility] although I think they are out of network.

## 2017-05-09 NOTE — Telephone Encounter (Signed)
Called pt to inform.

## 2017-06-01 ENCOUNTER — Encounter: Payer: Medicare Other | Attending: Psychology | Admitting: Psychology

## 2017-06-01 DIAGNOSIS — I4891 Unspecified atrial fibrillation: Secondary | ICD-10-CM | POA: Diagnosis not present

## 2017-06-01 DIAGNOSIS — M5416 Radiculopathy, lumbar region: Secondary | ICD-10-CM | POA: Insufficient documentation

## 2017-06-01 DIAGNOSIS — N183 Chronic kidney disease, stage 3 (moderate): Secondary | ICD-10-CM | POA: Insufficient documentation

## 2017-06-01 DIAGNOSIS — I2510 Atherosclerotic heart disease of native coronary artery without angina pectoris: Secondary | ICD-10-CM | POA: Diagnosis not present

## 2017-06-01 DIAGNOSIS — K219 Gastro-esophageal reflux disease without esophagitis: Secondary | ICD-10-CM | POA: Diagnosis not present

## 2017-06-01 DIAGNOSIS — I129 Hypertensive chronic kidney disease with stage 1 through stage 4 chronic kidney disease, or unspecified chronic kidney disease: Secondary | ICD-10-CM | POA: Insufficient documentation

## 2017-06-01 DIAGNOSIS — I10 Essential (primary) hypertension: Secondary | ICD-10-CM | POA: Diagnosis not present

## 2017-06-01 DIAGNOSIS — F329 Major depressive disorder, single episode, unspecified: Secondary | ICD-10-CM | POA: Insufficient documentation

## 2017-06-01 DIAGNOSIS — G894 Chronic pain syndrome: Secondary | ICD-10-CM | POA: Diagnosis not present

## 2017-06-01 DIAGNOSIS — Z801 Family history of malignant neoplasm of trachea, bronchus and lung: Secondary | ICD-10-CM | POA: Insufficient documentation

## 2017-06-01 DIAGNOSIS — M961 Postlaminectomy syndrome, not elsewhere classified: Secondary | ICD-10-CM | POA: Diagnosis not present

## 2017-06-05 ENCOUNTER — Encounter: Payer: Self-pay | Admitting: Psychology

## 2017-06-05 ENCOUNTER — Encounter (HOSPITAL_BASED_OUTPATIENT_CLINIC_OR_DEPARTMENT_OTHER): Payer: Medicare Other | Admitting: Psychology

## 2017-06-05 DIAGNOSIS — G894 Chronic pain syndrome: Secondary | ICD-10-CM | POA: Diagnosis not present

## 2017-06-05 DIAGNOSIS — M961 Postlaminectomy syndrome, not elsewhere classified: Secondary | ICD-10-CM

## 2017-06-05 DIAGNOSIS — M5416 Radiculopathy, lumbar region: Secondary | ICD-10-CM

## 2017-06-05 NOTE — Progress Notes (Signed)
Neuropsychological Consultation   Patient:   Cheryl [name]   DOB:   [birth year]  MR Number:  [id number]  Location:  [facility] FOR PAIN AND REHABILITATIVE MEDICINE [facility] PHYSICAL MEDICINE AND REHABILITATION [address] [place] [address] [id number] [place] [address] Dept: [phone]           Date of Service:   06/01/2017  Start Time:   4 PM End Time:   5 PM  Provider/Observer:  Arley Phenix, Psy.D.       Clinical Neuropsychologist       Billing Code/Service: [phone] 4 Units  Chief Complaint:    Cheryl [name] is an 83 year old female who was referred by Dr. Alvester Morin for the standard psychological evaluation as part of the preliminary assessment for appropriateness for spinal cord stimulator trialing and possible implantation.  The patient has been dealing with severe chronic pain for many years now.  The patient has had 4 prior back surgeries.  She reports that the first back surgery occurred in 1980 and she did very well after that surgery.  The patient ended up having a laminectomy in 2010 and then a fusion in 2015 as well as another laminectomy/revision in 2017.  The patient has had increasing pain since that time.  Reason for Service:  Cheryl [name] was referred for a psychological evaluation as part of the spinal cord stimulator trial lead and possible implantation efforts.  The patient describes significant chronic pain attributed to post laminectomy syndrome.  The patient has had 4 prior back surgeries going back to 1980.  The patient denies any significant depression or anxiety but does acknowledge times where she gets down and depressed as part of her overall life stressors.  The patient reports her husband has been diagnosed with Alzheimer's but it is still in the early stage.  Patient reports that her husband is still good function and they have a good relationship at this point but she does worry about what will happen in the future.  The patient reports that  her memory is quite good and that her overall physical health is good except for her chronic pain.  The patient does help with chronic high blood pressure which is been managed as well as A. fib that is under control.  Current Status:  The patient describes significant chronic pain and the purpose of this evaluation to conduct a formal psychological evaluation as part of this pulmonary work-up.  Reliability of Information: The information is provided by 1 hour face-to-face clinical interview with the patient as well as review of available medical records.  Behavioral Observation: Cheryl [name]  presents as a 83 y.o.-year-old Left Caucasian Female who appeared her stated age. her dress was Appropriate and she was Well Groomed and her manners were Appropriate to the situation.  her participation was indicative of Appropriate and Attentive behaviors.  There were any physical disabilities noted.  she displayed an appropriate level of cooperation and motivation.     Interactions:    Active Appropriate and Attentive  Attention:   within normal limits and attention span and concentration were age appropriate  Memory:   within normal limits; recent and remote memory intact  Visuo-spatial:  within normal limits  Speech (Volume):  normal  Speech:   normal; normal  Thought Process:  Coherent and Relevant  Though Content:  WNL; not suicidal and not homicidal  Orientation:   person, place, time/date and situation  Judgment:   Good  Planning:  Good  Affect:    Appropriate  Mood:    NA  Insight:   Good  Intelligence:   normal  Marital Status/Living: The patient reports that she was born in [place] and has no siblings.  She weighed 5 pounds 6 ounces at birth and developmental milestones were reached at the appropriate time.  The patient currently lives with her husband and they have been married for 58 years 11 the present time for the past 22 years.  Both of her parents are  deceased.  They have 3 children age 91, 78, and 54.  Current Employment: The patient is retired.  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  Her hobbies and interests include reading, sewing.  Substance Use:  No concerns of substance abuse are reported.    Education:   Control and instrumentation engineer History:   Past Medical History:  Diagnosis Date  . Arthritis   . CAD (coronary artery disease)    Mild per remote cath in 2004; normal nuclear in 2012  . Chronic kidney disease    stage3 kidney disease   . Complication of anesthesia    slow to wake up  . Diastolic dysfunction    Grade I, per echo in 2012  . Dysrhythmia   . GERD (gastroesophageal reflux disease)    prevacid  . H/O hiatal hernia   . Headache(784.0)    sinus  . High risk medication use    Flecainide therapy  . History of heartburn   . Hyperlipidemia   . Hypertension   . LVH (left ventricular hypertrophy)    Per echo in July 2012  . Normal nuclear stress test July 2012  . Obesity   . PAF (paroxysmal atrial fibrillation) (HCC)   . Pneumonia    20 yrs ago  . Syncope July 2012        Abuse/Trauma History: The patient denies any history of traumatic experiences.  Psychiatric History:  The patient denies any sick significant prior psychiatric history no history of significant anxiety or depression.  Family Med/Psych History:  Family History  Problem Relation Age of Onset  . Lung cancer Father     Risk of Suicide/Violence: virtually non-existent   Impression/DX:  Cheryl [name] was referred for a psychological evaluation as part of the spinal cord stimulator trial lead and possible implantation efforts.  The patient describes significant chronic pain attributed to post laminectomy syndrome.  The patient has had 4 prior back surgeries going back to 1980.  The patient denies any significant depression or anxiety but does acknowledge times where she gets down and depressed as  part of her overall life stressors.  The patient reports her husband has been diagnosed with Alzheimer's but it is still in the early stage.  Patient reports that her husband is still good function and they have a good relationship at this point but she does worry about what will happen in the future.  The patient reports that her memory is quite good and that her overall physical health is good except for her chronic pain.  The patient does help with chronic high blood pressure which is been managed as well as A. fib that is under control.  The patient describes significant chronic pain and the purpose of this evaluation to conduct a formal psychological evaluation as part of this pulmonary work-up.  The patient also denies any significant psychosocial stressors outside of the fact that her  husband has been diagnosed with Alzheimer's but he is doing very well and is in the early stages.  She and her husband are coping well with the worsening of his memory functioning.  Disposition/Plan:  The patient will complete the MMPI-2 as well as the pain patient profile the formal report will be produced and provided to Dr. Alvester Morin  Diagnosis:    Radiculopathy, lumbar region  Chronic pain syndrome  Post laminectomy syndrome         Electronically Signed   _______________________ Arley Phenix, Psy.D.

## 2017-06-05 NOTE — Progress Notes (Signed)
Patient:  Cheryl [name]   DOB: [birth year]  MR Number: [id number]  Location: [facility] FOR PAIN AND REHABILITATIVE MEDICINE [facility] PHYSICAL MEDICINE AND REHABILITATION [address], [place] [address] [id number] [place] [address] Dept: [phone]  Start: 10 AM End: 11 AM  Provider/Observer:     Hershal Coria PSYD  Chief Complaint:      Chief Complaint  Patient presents with  . Back Pain  . Pain    Reason For Service:      Cheryl [name] is an 83 year old female who was referred by Dr. Alvester Morin for the standard psychological evaluation as part of the preliminary assessment for appropriateness for spinal cord stimulator trialing and possible implantation.  The patient has been dealing with severe chronic pain for many years now.  The patient has had 4 prior back surgeries.  She reports that the first back surgery occurred in 1980 and she did very well after that surgery.  The patient ended up having a laminectomy in 2010 and then a fusion in 2015 as well as another laminectomy/revision in 2017.  The patient has had increasing pain since that time.   Testing Administered:  The patient completed the [place] multiphasic personality inventory as well as the pain patient profile (P3)  Participation Level:   Active  Participation Quality:  Appropriate and Attentive      Behavioral Observation:  Well Groomed, Alert, and Appropriate.   Test Results:   Initially, the patient completed the MMPI-2.  The resulting validity scales suggest that the patient approach this measure generally honest and straightforward fashion either attempting to exaggerate or minimize her current symptomatology.  None of the resulting basic/clinical scales were in the clinically significant range.  While her highest scale was the depression scale it was not above the clinically significant range and is not indicative of any significant symptoms of depression or anxiety.  The patient does describe  medical/somatic experiences difficulties both the vague and specific nature but at the level they are they are consistent with the patient's difficulties with pain but an individual who is coping fairly well with it.  Further analysis utilizing content scales also show that none of these variables were in the clinically significant range.  There is no indication of significant anxiety, obsessive thinking, depression, excessive health concerns suggesting possible somatizations disorder, psychosis or other psychiatric illnesses or significant family discord or stressors.  Analysis utilizing supplemental scales do show that the patient is dealing with some issues of physical difficulties but not all of these are appropriate.  The patient did not have an elevation on a measure sensitive to vulnerability toward substance abuse.  The patient also had no elevation in either of the PTSD scales.  The patient does describe significant elevation in her slowed psychomotor functioning which are likely directly related to her chronic pain.  The patient denies any mental dulling or cognitive difficulties denies malaise or difficulties with motivation.  Patient also denies any family distress or family discord.  The patient also completed the pain patient profile (P3).  As far as validity measures this does appear to be a valid assessment it does not appear that the patient exaggerated her performance.  This profile is directly normed on both the community/normative sample as well as a sample of chronic pain patients without significant psychiatric difficulties.  Relative to both the community-based normative sample as well as the chronic pain patient sample the patient showed no significant issues with regard to anxiety  or depression.  While there was an elevation relative to the normative/community samples with regard to somatic/health functioning measures these were actually below the mean level for a chronic pain patient  nonpsychiatric population.  There do not appear to be any significant issues related to somatoform or somatizations disorders.  Summary of Results:   The results of the current psychological assessment utilizing objective measures including the MMPI-to the P3 of the inventories as well as a formal 1 hour clinical interview do suggest that the patient appears to be an excellent candidate for spinal cord stimulator trialing and possible implantation from a psychological/psychosocial perspective.  There are no indications of any psychiatric illness that would be deleterious to her ability to fully participate in the trialing phase.  There are no indications of any significant cognitive deficits or cognitive decline she does appear to be a very healthy cognitively intact 83 year old female.  There are no indications of significant depression or anxiety and while there are some psychosocial issues associated with her husband's diagnosis of Alzheimer's she appears to be managing and coping to this issue quite well.  Diagnosis:    Axis I: Radiculopathy, lumbar region  Chronic pain syndrome  Post laminectomy syndrome   Arley Phenix, Psy.D. Neuropsychologist

## 2017-06-28 ENCOUNTER — Telehealth (INDEPENDENT_AMBULATORY_CARE_PROVIDER_SITE_OTHER): Payer: Self-pay | Admitting: Physical Medicine and Rehabilitation

## 2017-07-05 MED ORDER — DIAZEPAM 5 MG PO TABS: ORAL_TABLET | ORAL | 0 refills | Status: DC

## 2017-07-05 NOTE — Telephone Encounter (Signed)
Called patient to advise that prescription has been sent to pharmacy. 

## 2017-07-05 NOTE — Telephone Encounter (Signed)
Pre-procedure valium Rx.Marland Kitchen

## 2017-07-05 NOTE — Telephone Encounter (Signed)
Done

## 2017-07-13 ENCOUNTER — Ambulatory Visit (INDEPENDENT_AMBULATORY_CARE_PROVIDER_SITE_OTHER): Payer: Medicare Other

## 2017-07-13 ENCOUNTER — Encounter (INDEPENDENT_AMBULATORY_CARE_PROVIDER_SITE_OTHER): Payer: Self-pay | Admitting: Physical Medicine and Rehabilitation

## 2017-07-13 ENCOUNTER — Ambulatory Visit (INDEPENDENT_AMBULATORY_CARE_PROVIDER_SITE_OTHER): Payer: Medicare Other | Admitting: Physical Medicine and Rehabilitation

## 2017-07-13 VITALS — BP 140/85 | HR 58 | Temp 97.6°F

## 2017-07-13 DIAGNOSIS — M5416 Radiculopathy, lumbar region: Secondary | ICD-10-CM | POA: Diagnosis not present

## 2017-07-13 DIAGNOSIS — G894 Chronic pain syndrome: Secondary | ICD-10-CM

## 2017-07-13 DIAGNOSIS — M961 Postlaminectomy syndrome, not elsewhere classified: Secondary | ICD-10-CM

## 2017-07-13 MED ORDER — LIDOCAINE HCL (PF) 1 % IJ SOLN: 10.0000 mL | Freq: Once | INTRAMUSCULAR | Status: DC

## 2017-07-13 MED ORDER — CEPHALEXIN 500 MG PO CAPS: 500.0000 mg | ORAL_CAPSULE | Freq: Two times a day (BID) | ORAL | 0 refills | Status: DC

## 2017-07-13 NOTE — Progress Notes (Signed)
   Numeric Pain Rating Scale and Functional Assessment Average Pain (8)   In the last MONTH (on 0-10 scale) has pain interfered with the following?  1. General activity like being  able to carry out your everyday physical activities such as walking, climbing stairs, carrying groceries, or moving a chair?  Rating(9)   +Driver, -BT, -Dye Allergies.  

## 2017-07-17 NOTE — Procedures (Signed)
Spinal Cord Stimulator Trial  Patient: Cheryl [name]      Date of Birth: [birth year] MRN: [id number] PCP: Laurann MontanaWhite, Cynthia, MD      Visit Date: 07/13/2017   Universal Protocol:    Date/Time: 06/11/197:04 AM  Consent Given By: the patient  Position: PRONE  Additional Comments: Vital signs were monitored before and after the procedure. Patient was prepped and draped in the usual sterile fashion. The correct patient, procedure, and site was verified.   Injection Procedure Details:  Procedure Site One Meds Administered:  Meds ordered this encounter  Medications  . lidocaine (PF) (XYLOCAINE) 1 % injection 10 mL  . cephALEXin (KEFLEX) 500 MG capsule    Sig: Take 1 capsule (500 mg total) by mouth 2 (two) times daily.    Dispense:  14 capsule    Refill:  0     Location/Site: ** PLEASE NOTE prior x-rays and MRI images designate the upper level of the instrumented fusion as the L3 vertebral body.  If using this numbering designation where the L3 vertebral body has the uppermost level of pedicle screws then the last rib-bearing vertebral body would be labeled T11.  Prior laminectomy would have been performed at L1-2 given this numbering Engineer, materialsdesignation.  This is confirmed on MRI imaging.  [employer] (16) electrode stimulation lead was placed just left of midline with the superiormost lead essentially midline of the T6 vertebral body with the rest of the leads spanning approximately 2.5 vertebral bodies posteriorly so that bottom lead was in the upper third of the T9 vertebral body.   Needle size: 14 gauge   Needle type: EPIMED RX Coud Epidural Needle  Needle Placement: T10-11 Dorsal epidural space  Findings:   -Comments: Excellent paresthesia coverage was obtained in all of the areas of the patient's normal pain  Procedure Details: After localization of the T10-11 epidural space and the overlying soft tissue, the needle was introduced two bodies below this level to produce  an adequate angle for epidural penetration. The soft tissue was infiltrated with 2-3 mls. of 1% Lidocaine without Epinephrine. Using the posterolateral approach, the #14 gauge Epimed needle was inserted down to the posterior aspect of the T11 lamina and then "walked off" the superior aspect of this lamina into the T10-11 epidural space. The epidural space was localized with loss of resistance and negative aspirate for CSF or blood. The [employer] (16) electrode stimulation lead was passed up so that just the superior most lead was at the location noted above while maintaining the lead in the midline.   The pulse generator system was adjusted and programmed by myself and the company technical representative by varying the stimulator sites, rates, pulse amplitude, and duration. Adequate coverage was obtained as described above.  Subsequent to this, the introducer needle was removed and the spinal cord stimulator trial lead was fastened to the skin using steri-strips and strain reduction loop. The lead wire was then connected and Tegaderm was applied to produce an occlusive dressing. The patient was then brought out of the procedure room and was given a portable stimulator.  The patient will follow-up in three to seven days to determine whether this was efficacious.  Oral prophylactic antibiotic, either Keflex or clindamycin, was prescribed as noted.   Additional Comments:  The patient tolerated the procedure well Dressing: As noted above    Post-procedure details: Patient was observed during the procedure. Post-procedure instructions were reviewed.  Patient left the clinic in stable condition.

## 2017-07-17 NOTE — Procedures (Signed)
Approximately 1 hour of advanced stimulator programming was performed in conjunction with [employer] representative.

## 2017-07-17 NOTE — Patient Instructions (Signed)

## 2017-07-17 NOTE — Progress Notes (Signed)
Cheryl [name] - 83 y.o. female MRN [id number]  Date of birth: [birth year]  Office Visit Note: Visit Date: 07/13/2017 PCP: Laurann Montana, MD Referred by: Laurann Montana, MD  Subjective: Chief Complaint  Patient presents with  . Lower Back - Pain   HPI: Cheryl [name] is an 83 year old female well-known to me with prior lumbar surgeries x4 with lumbar fusion L3 down to L5 and more recent L1-2 laminectomy discectomy all performed by Dr. Delma Officer.  Since January she has had ongoing left buttock and leg pain in L5 distribution.  Updated MRI has been reviewed with the patient in a prior visit.  She has had several injections over the last 6 months including epidural injections and hip injection.  She does not tolerate steroid medication very well at all and actually can give her hyponatremia and she was actually seen in the emergency department for this after injection. The patient has failed conservative care including time, medications and activity modification as well as extensive physical therapy and surgery and injections.  She is here today for planned spinal cord stimulator trial.  She has had a pre-trial psychological evaluation performed by Dr. Kieth Brightly.  We do have preapproval for the stimulator trial.  Please note in the procedure the exact placement of the leads given the fact that the patient likely has 6 lumbar vertebral bodies.  This numbering scheme utilized would be the one given by the radiologist for lumbar x-ray and MRI.  This would make the last rib-bearing structure a T11 instead of T12.   ROS Otherwise per HPI.  Assessment & Plan: Visit Diagnoses:  1. Radiculopathy, lumbar region   2. Post laminectomy syndrome   3. Chronic pain syndrome     Plan: No additional findings.   Meds & Orders:  Meds ordered this encounter  Medications  . lidocaine (PF) (XYLOCAINE) 1 % injection 10 mL  . cephALEXin (KEFLEX) 500 MG capsule    Sig: Take 1 capsule (500 mg total) by mouth 2  (two) times daily.    Dispense:  14 capsule    Refill:  0    Orders Placed This Encounter  Procedures  . Spinal Cord Stimulator - Placement  . Spinal Cord Stimulator - Analysis  . XR C-ARM NO REPORT    Follow-up: No follow-ups on file.   Procedures: No procedures performed  Spinal Cord Stimulator Trial  Patient: Cheryl [name]      Date of Birth: [birth year] MRN: [id number] PCP: Laurann Montana, MD      Visit Date: 07/13/2017   Universal Protocol:    Date/Time: 06/11/197:04 AM  Consent Given By: the patient  Position: PRONE  Additional Comments: Vital signs were monitored before and after the procedure. Patient was prepped and draped in the usual sterile fashion. The correct patient, procedure, and site was verified.   Injection Procedure Details:  Procedure Site One Meds Administered:  Meds ordered this encounter  Medications  . lidocaine (PF) (XYLOCAINE) 1 % injection 10 mL  . cephALEXin (KEFLEX) 500 MG capsule    Sig: Take 1 capsule (500 mg total) by mouth 2 (two) times daily.    Dispense:  14 capsule    Refill:  0     Location/Site: ** PLEASE NOTE prior x-rays and MRI images designate the upper level of the instrumented fusion as the L3 vertebral body.  If using this numbering designation where the L3 vertebral body has the uppermost level of pedicle screws then the last rib-bearing vertebral body  would be labeled T11.  Prior laminectomy would have been performed at L1-2 given this numbering Engineer, materials.  This is confirmed on MRI imaging.  [employer] (16) electrode stimulation lead was placed just left of midline with the superiormost lead essentially midline of the T6 vertebral body with the rest of the leads spanning approximately 2.5 vertebral bodies posteriorly so that bottom lead was in the upper third of the T9 vertebral body.   Needle size: 14 gauge   Needle type: EPIMED RX Coud Epidural Needle  Needle Placement: T10-11 Dorsal epidural  space  Findings:   -Comments: Excellent paresthesia coverage was obtained in all of the areas of the patient's normal pain  Procedure Details: After localization of the T10-11 epidural space and the overlying soft tissue, the needle was introduced two bodies below this level to produce an adequate angle for epidural penetration. The soft tissue was infiltrated with 2-3 mls. of 1% Lidocaine without Epinephrine. Using the posterolateral approach, the #14 gauge Epimed needle was inserted down to the posterior aspect of the T11 lamina and then "walked off" the superior aspect of this lamina into the T10-11 epidural space. The epidural space was localized with loss of resistance and negative aspirate for CSF or blood. The [employer] (16) electrode stimulation lead was passed up so that just the superior most lead was at the location noted above while maintaining the lead in the midline.   The pulse generator system was adjusted and programmed by myself and the company technical representative by varying the stimulator sites, rates, pulse amplitude, and duration. Adequate coverage was obtained as described above.  Subsequent to this, the introducer needle was removed and the spinal cord stimulator trial lead was fastened to the skin using steri-strips and strain reduction loop. The lead wire was then connected and Tegaderm was applied to produce an occlusive dressing. The patient was then brought out of the procedure room and was given a portable stimulator.  The patient will follow-up in three to seven days to determine whether this was efficacious.  Oral prophylactic antibiotic, either Keflex or clindamycin, was prescribed as noted.   Additional Comments:  The patient tolerated the procedure well Dressing: As noted above    Post-procedure details: Patient was observed during the procedure. Post-procedure instructions were reviewed.  Patient left the clinic in stable  condition.    Approximately 1 hour of advanced stimulator programming was performed in conjunction with [employer].    Clinical History: MRI LUMBAR SPINE WITHOUT CONTRAST  TECHNIQUE: Multiplanar, multisequence MR imaging of the lumbar spine was performed. No intravenous contrast was administered.  COMPARISON:  Prior MRI from 03/19/2015.  FINDINGS: Segmentation: Normal segmentation. Lowest well-formed disc labeled the L5-S1 level. Same numbering system employed as on previous exams.  Alignment: Stable levoscoliosis. Unchanged 3 mm retrolisthesis of T12 on L1, with 3 mm anterolisthesis of L3 on L4.  Vertebrae: Vertebral body heights are maintained without evidence for acute or interval fracture. Susceptibility artifact from prior fusion at L3-4 through L5-S1. Multilevel reactive endplate changes seen throughout the lumbar spine, most notable at the T12-L1, L1-2, and L5-S1 levels. Underlying bone marrow signal intensity within normal limits. Subcentimeter benign hemangioma noted within the T11 vertebral body. No worrisome osseous lesions.  Conus medullaris and cauda equina: Conus extends to the L1-2 level. Conus and cauda equina appear normal.  Paraspinal and other soft tissues: Paraspinous soft tissues demonstrate no acute abnormality. Visualized visceral structures within normal limits.  Disc levels:  T12-L1:  3 mm retrolisthesis. Progressive intervertebral disc space narrowing with diffuse disc bulge with chronic reactive endplate changes and resultant disc osteophyte slightly eccentric to the right. Moderate facet and ligament flavum hypertrophy. Moderate to advanced right foraminal stenosis is relatively similar to previous. Slightly progressive moderate spinal stenosis with lateral recess narrowing.  L1-2: Diffuse disc bulge with disc desiccation and intervertebral disc space narrowing. Reactive endplate changes with  marginal endplate osteophytic spurring. Bilateral facet hypertrophy, greater on the left. Probable remote postsurgical changes at this level. Stable mild canal with moderate left lateral recess narrowing. Mild bilateral foraminal stenosis, right greater than left, also stable.  L2-3: Diffuse disc bulge with disc desiccation and intervertebral disc space narrowing, progressed from previous. Mild reactive marginal endplate spurring. Moderate facet and ligamentum flavum hypertrophy. Mild canal and bilateral foraminal stenosis relatively similar.  L3-4: Postoperative changes from remote PLIF. No residual or recurrent canal or foraminal stenosis.  L4-5: Remote postoperative changes from previous PLIF. No residual or recurrent canal or foraminal stenosis.  L5-S1: Chronic intervertebral disc space narrowing with diffuse disc bulge and disc desiccation. Reactive endplate changes with marginal endplate osteophytic spurring. Remote right-sided laminotomy defect. Moderate facet hypertrophy. Resultant moderate lateral recess narrowing bilaterally, similar to previous. Moderate to severe left foraminal stenosis appears progressed from previous, with possible L5 nerve root impingement. Similar moderate right L5 foraminal narrowing.  IMPRESSION: 1. Multifactorial degenerative changes at L5-S1 with resultant moderate to severe left L5 foraminal stenosis, slightly progressed from previous. 2. Progressive degenerative spondylolysis at T12-L1 with slightly worsened moderate spinal stenosis with similar moderate to advanced right foraminal narrowing. 3. Stable chronic degenerative disc disease and facet hypertrophy at L1-2 and L2-3 with resultant lateral recess and foraminal narrowing as above.   Electronically Signed   By: Rise Mu M.D.   On: 04/04/2017 17:20   She reports that she has never smoked. She has never used smokeless tobacco. No results for input(s): HGBA1C,  LABURIC in the last 8760 hours.  Objective:  VS:  HT:    WT:   BMI:     BP:140/85  HR:(!) 58bpm  TEMP:97.6 F (36.4 C)(Oral)  RESP:98 % Physical Exam  Ortho Exam Imaging: No results found.  Past Medical/Family/Surgical/Social History: Medications & Allergies reviewed per EMR, new medications updated. Patient Active Problem List   Diagnosis Date Noted  . Headache 02/23/2017  . Nausea & vomiting 02/23/2017  . Hyponatremia 02/23/2017  . Hypertensive urgency 02/23/2017  . Lumbar herniated disc 02/15/2015  . CKD (chronic kidney disease) stage 3, GFR 30-59 ml/min (HCC) 06/22/2014  . Spondylolisthesis of lumbar region 08/07/2013  . Hyperlipidemia 06/13/2008  . PAF (paroxysmal atrial fibrillation) (HCC) 06/13/2008  . Essential hypertension 11/05/2006  . Coronary atherosclerosis 11/05/2006   Past Medical History:  Diagnosis Date  . Arthritis   . CAD (coronary artery disease)    Mild per remote cath in 2004; normal nuclear in 2012  . Chronic kidney disease    stage3 kidney disease   . Complication of anesthesia    slow to wake up  . Diastolic dysfunction    Grade I, per echo in 2012  . Dysrhythmia   . GERD (gastroesophageal reflux disease)    prevacid  . H/O hiatal hernia   . Headache(784.0)    sinus  . High risk medication use    Flecainide therapy  . History of heartburn   . Hyperlipidemia   . Hypertension   . LVH (left ventricular hypertrophy)    Per echo in July 2012  .  Normal nuclear stress test July 2012  . Obesity   . PAF (paroxysmal atrial fibrillation) (HCC)   . Pneumonia    20 yrs ago  . Syncope July 2012   Family History  Problem Relation Age of Onset  . Lung cancer Father    Past Surgical History:  Procedure Laterality Date  . APPENDECTOMY  1952  . BACK SURGERY  1980, 2010   x2  . CARDIAC CATHETERIZATION  07/28/2002   EF GREATER THAN 55%; MILD CAD  . COLONOSCOPY    . DILATION AND CURETTAGE OF UTERUS    . LUMBAR LAMINECTOMY/DECOMPRESSION  MICRODISCECTOMY Right 02/15/2015   Procedure: Right Thoracic twelve-Lumbar one  Microdiskectomy;  Surgeon: Tressie StalkerJeffrey Jenkins, MD;  Location: [facility];  Service: Neurosurgery;  Laterality: Right;  Thoracic/Lumbar  . TONSILLECTOMY     age 89 yrs  . TUBAL LIGATION     Social History   Occupational History  . Not on file  Tobacco Use  . Smoking status: Never Smoker  . Smokeless tobacco: Never Used  Substance and Sexual Activity  . Alcohol use: No  . Drug use: No  . Sexual activity: Not on file

## 2017-07-19 ENCOUNTER — Ambulatory Visit (INDEPENDENT_AMBULATORY_CARE_PROVIDER_SITE_OTHER): Payer: Medicare Other | Admitting: Physical Medicine and Rehabilitation

## 2017-07-19 ENCOUNTER — Encounter (INDEPENDENT_AMBULATORY_CARE_PROVIDER_SITE_OTHER): Payer: Self-pay | Admitting: Physical Medicine and Rehabilitation

## 2017-07-19 DIAGNOSIS — G894 Chronic pain syndrome: Secondary | ICD-10-CM

## 2017-07-19 DIAGNOSIS — M5416 Radiculopathy, lumbar region: Secondary | ICD-10-CM

## 2017-07-19 DIAGNOSIS — M961 Postlaminectomy syndrome, not elsewhere classified: Secondary | ICD-10-CM

## 2017-07-19 NOTE — Progress Notes (Signed)
Cheryl [name] - 83 y.o. female MRN [id number]  Date of birth: [birth year]  Office Visit Note: Visit Date: 07/19/2017 PCP: Laurann MontanaWhite, Cynthia, Cheryl [name] Referred by: Laurann MontanaWhite, Cynthia, Cheryl [name]  Subjective: No chief complaint on file.  HPI: Cheryl [name] is a very pleasant 83 year old female with prior lumbar surgery including fusion with persistent recalcitrant left hip and leg pain and low back pain.  Cheryl [name] is status post spinal cord stimulator trial as noted in the prior procedure note.  Cheryl [name] comes in today for lead pull.  Cheryl [name] reports 60% relief and stating that Cheryl [name] can move much better and stands more upright as Cheryl [name] is moving and doing things that Cheryl [name] likes to do.  Cheryl [name] reports still some pain in the left leg to a degree but doing much better.  Cheryl [name] does want to proceed with implantation.  We will get a consultation request for Dr. Ollen BowlHarkins to see her.   ROS Otherwise per HPI.  Assessment & Plan: Visit Diagnoses:  1. Radiculopathy, lumbar region   2. Post laminectomy syndrome   3. Chronic pain syndrome     Plan: Findings:  Quick procedure note: Patient was placed prone on the exam table. The external stimulator/generator was unfastened from the leads. The outer Tegaderm was removed from the bandage carefully. There was no noted erythema of the skin or swelling or induration or drainage. There had been some bloody discharge in the gauze pad. Trial lead was pulled without difficulty and without discomfort. There was no drainage from the insertion site. Band-Aid was applied.    Meds & Orders: No orders of the defined types were placed in this encounter.   Orders Placed This Encounter  Procedures  . Ambulatory referral to Anesthesiology    Follow-up: Return if symptoms worsen or fail to improve.   Procedures: No procedures performed  No notes on file   Clinical History: MRI LUMBAR SPINE WITHOUT CONTRAST  TECHNIQUE: Multiplanar, multisequence MR imaging of the lumbar spine was performed. No intravenous  contrast was administered.  COMPARISON:  Prior MRI from 03/19/2015.  FINDINGS: Segmentation: Normal segmentation. Lowest well-formed disc labeled the L5-S1 level. Same numbering system employed as on previous exams.  Alignment: Stable levoscoliosis. Unchanged 3 mm retrolisthesis of T12 on L1, with 3 mm anterolisthesis of L3 on L4.  Vertebrae: Vertebral body heights are maintained without evidence for acute or interval fracture. Susceptibility artifact from prior fusion at L3-4 through L5-S1. Multilevel reactive endplate changes seen throughout the lumbar spine, most notable at the T12-L1, L1-2, and L5-S1 levels. Underlying bone marrow signal intensity within normal limits. Subcentimeter benign hemangioma noted within the T11 vertebral body. No worrisome osseous lesions.  Conus medullaris and cauda equina: Conus extends to the L1-2 level. Conus and cauda equina appear normal.  Paraspinal and other soft tissues: Paraspinous soft tissues demonstrate no acute abnormality. Visualized visceral structures within normal limits.  Disc levels:  T12-L1: 3 mm retrolisthesis. Progressive intervertebral disc space narrowing with diffuse disc bulge with chronic reactive endplate changes and resultant disc osteophyte slightly eccentric to the right. Moderate facet and ligament flavum hypertrophy. Moderate to advanced right foraminal stenosis is relatively similar to previous. Slightly progressive moderate spinal stenosis with lateral recess narrowing.  L1-2: Diffuse disc bulge with disc desiccation and intervertebral disc space narrowing. Reactive endplate changes with marginal endplate osteophytic spurring. Bilateral facet hypertrophy, greater on the left. Probable remote postsurgical changes at this level. Stable mild canal with moderate left lateral recess narrowing. Mild bilateral foraminal stenosis, right greater than  left, also stable.  L2-3: Diffuse disc bulge with disc  desiccation and intervertebral disc space narrowing, progressed from previous. Mild reactive marginal endplate spurring. Moderate facet and ligamentum flavum hypertrophy. Mild canal and bilateral foraminal stenosis relatively similar.  L3-4: Postoperative changes from remote PLIF. No residual or recurrent canal or foraminal stenosis.  L4-5: Remote postoperative changes from previous PLIF. No residual or recurrent canal or foraminal stenosis.  L5-S1: Chronic intervertebral disc space narrowing with diffuse disc bulge and disc desiccation. Reactive endplate changes with marginal endplate osteophytic spurring. Remote right-sided laminotomy defect. Moderate facet hypertrophy. Resultant moderate lateral recess narrowing bilaterally, similar to previous. Moderate to severe left foraminal stenosis appears progressed from previous, with possible L5 nerve root impingement. Similar moderate right L5 foraminal narrowing.  IMPRESSION: 1. Multifactorial degenerative changes at L5-S1 with resultant moderate to severe left L5 foraminal stenosis, slightly progressed from previous. 2. Progressive degenerative spondylolysis at T12-L1 with slightly worsened moderate spinal stenosis with similar moderate to advanced right foraminal narrowing. 3. Stable chronic degenerative disc disease and facet hypertrophy at L1-2 and L2-3 with resultant lateral recess and foraminal narrowing as above.   Electronically Signed   By: Rise Mu M.D.   On: 04/04/2017 17:20   Cheryl [name] reports that Cheryl [name] has never smoked. Cheryl [name] has never used smokeless tobacco. No results for input(s): HGBA1C, LABURIC in the last 8760 hours.  Objective:  VS:  HT:    WT:   BMI:     BP:   HR: bpm  TEMP: ( )  RESP:  Physical Exam  Ortho Exam Imaging: No results found.  Past Medical/Family/Surgical/Social History: Medications & Allergies reviewed per EMR, new medications updated. Patient Active Problem List    Diagnosis Date Noted  . Headache 02/23/2017  . Nausea & vomiting 02/23/2017  . Hyponatremia 02/23/2017  . Hypertensive urgency 02/23/2017  . Lumbar herniated disc 02/15/2015  . CKD (chronic kidney disease) stage 3, GFR 30-59 ml/min (HCC) 06/22/2014  . Spondylolisthesis of lumbar region 08/07/2013  . Hyperlipidemia 06/13/2008  . PAF (paroxysmal atrial fibrillation) (HCC) 06/13/2008  . Essential hypertension 11/05/2006  . Coronary atherosclerosis 11/05/2006   Past Medical History:  Diagnosis Date  . Arthritis   . CAD (coronary artery disease)    Mild per remote cath in 2004; normal nuclear in 2012  . Chronic kidney disease    stage3 kidney disease   . Complication of anesthesia    slow to wake up  . Diastolic dysfunction    Grade I, per echo in 2012  . Dysrhythmia   . GERD (gastroesophageal reflux disease)    prevacid  . H/O hiatal hernia   . Headache(784.0)    sinus  . High risk medication use    Flecainide therapy  . History of heartburn   . Hyperlipidemia   . Hypertension   . LVH (left ventricular hypertrophy)    Per echo in July 2012  . Normal nuclear stress test July 2012  . Obesity   . PAF (paroxysmal atrial fibrillation) (HCC)   . Pneumonia    20 yrs ago  . Syncope July 2012   Family History  Problem Relation Age of Onset  . Lung cancer Father    Past Surgical History:  Procedure Laterality Date  . APPENDECTOMY  1952  . BACK SURGERY  1980, 2010   x2  . CARDIAC CATHETERIZATION  07/28/2002   EF GREATER THAN 55%; MILD CAD  . COLONOSCOPY    . DILATION AND CURETTAGE OF UTERUS    .  LUMBAR LAMINECTOMY/DECOMPRESSION MICRODISCECTOMY Right 02/15/2015   Procedure: Right Thoracic twelve-Lumbar one  Microdiskectomy;  Surgeon: Tressie Stalker, Cheryl [name];  Location: [facility];  Service: Neurosurgery;  Laterality: Right;  Thoracic/Lumbar  . TONSILLECTOMY     age 49 yrs  . TUBAL LIGATION     Social History   Occupational History  . Not on file  Tobacco Use  . Smoking  status: Never Smoker  . Smokeless tobacco: Never Used  Substance and Sexual Activity  . Alcohol use: No  . Drug use: No  . Sexual activity: Not on file

## 2017-07-19 NOTE — Progress Notes (Signed)
   Numeric Pain Rating Scale and Functional Assessment Average Pain 4   In the last MONTH (on 0-10 scale) has pain interfered with the following?  1. General activity like being  able to carry out your everyday physical activities such as walking, climbing stairs, carrying groceries, or moving a chair?  Rating(2)       

## 2017-08-27 ENCOUNTER — Other Ambulatory Visit (INDEPENDENT_AMBULATORY_CARE_PROVIDER_SITE_OTHER): Payer: Self-pay | Admitting: Physical Medicine and Rehabilitation

## 2017-08-27 ENCOUNTER — Telehealth (INDEPENDENT_AMBULATORY_CARE_PROVIDER_SITE_OTHER): Payer: Self-pay | Admitting: Physical Medicine and Rehabilitation

## 2017-08-27 MED ORDER — TRAMADOL HCL 50 MG PO TABS: 50.0000 mg | ORAL_TABLET | Freq: Three times a day (TID) | ORAL | 0 refills | Status: AC | PRN

## 2017-08-27 NOTE — Telephone Encounter (Signed)
Done. Sent tramadol to pharm on record.

## 2017-08-28 NOTE — Telephone Encounter (Signed)
Patient notified

## 2017-10-01 ENCOUNTER — Telehealth (INDEPENDENT_AMBULATORY_CARE_PROVIDER_SITE_OTHER): Payer: Self-pay | Admitting: Physical Medicine and Rehabilitation

## 2017-10-01 NOTE — Telephone Encounter (Signed)
Left message for patient to call back to schedule an ov.

## 2017-10-01 NOTE — Telephone Encounter (Signed)
Would need to see her, OV

## 2017-10-01 NOTE — Telephone Encounter (Signed)
Scheduled for 10/11/17 at 0915.

## 2017-10-11 ENCOUNTER — Ambulatory Visit (INDEPENDENT_AMBULATORY_CARE_PROVIDER_SITE_OTHER): Payer: Medicare Other | Admitting: Physical Medicine and Rehabilitation

## 2017-10-11 ENCOUNTER — Encounter (INDEPENDENT_AMBULATORY_CARE_PROVIDER_SITE_OTHER): Payer: Self-pay | Admitting: Physical Medicine and Rehabilitation

## 2017-10-11 VITALS — BP 145/75 | HR 82

## 2017-10-11 DIAGNOSIS — Z9689 Presence of other specified functional implants: Secondary | ICD-10-CM | POA: Insufficient documentation

## 2017-10-11 DIAGNOSIS — M7062 Trochanteric bursitis, left hip: Secondary | ICD-10-CM

## 2017-10-11 DIAGNOSIS — M5416 Radiculopathy, lumbar region: Secondary | ICD-10-CM

## 2017-10-11 DIAGNOSIS — Z9889 Other specified postprocedural states: Secondary | ICD-10-CM

## 2017-10-11 DIAGNOSIS — M961 Postlaminectomy syndrome, not elsewhere classified: Secondary | ICD-10-CM | POA: Diagnosis not present

## 2017-10-11 DIAGNOSIS — G894 Chronic pain syndrome: Secondary | ICD-10-CM

## 2017-10-11 DIAGNOSIS — Z4542 Encounter for adjustment and management of neuropacemaker (brain) (peripheral nerve) (spinal cord): Secondary | ICD-10-CM

## 2017-10-11 NOTE — Progress Notes (Signed)
Cheryl [name] - 83 y.o. female MRN [id number]  Date of birth: 1934/06/06  Office Visit Note: Visit Date: 10/11/2017 PCP: Laurann Montana, MD Referred by: Laurann Montana, MD  Subjective: Chief Complaint  Patient presents with  . Lower Back - Pain  . Left Thigh - Pain   HPI: Cheryl [name] is a very pleasant 83 year old female with prior lumbar fusion and upper lumbar discectomy.  We saw her last back in June and completed trial of spinal cord stimulator with 60% relief of her low back and predominantly left hip and leg pain.  She went on to have permanent placement of spinal cord stimulator by Dr. Ollen Bowl sometime after that in June as well.  She reported initial good relief with the procedure.  She stated to me today that overall the stimulator helps to a degree but she began having sharp shooting pain sometime after that in the same distribution of her left hip and leg which is posterior lateral over the greater trochanter and into the thigh.  She reports this is worse with standing and weightbearing.  She has not tolerated epidural injection or hip injection with steroid and she gets really exaggerated responses from steroid medication.  She has had even to the point where she has gone into the emergency department with issues of hyponatremia etc.  She denies any paresthesias.  She denies any trauma around the time she had increasing symptoms.  She has had no falls.  She has not had any focal weakness.  Several weeks ago she was having complaints of this and the [employer] support team was notified.  Patient spoke with Apolinar Junes at that point and they were going to attempt to reprogram but her stimulator had not really had good charging and they will monitor at that time to charge the stimulator over several days and that they could reprogram.  I am not sure that this ever happened.  We did ask the representative to come in today for possible reprogramming.   Review of Systems  Constitutional:  Negative for chills, fever, malaise/fatigue and weight loss.  HENT: Negative for hearing loss and sinus pain.   Eyes: Negative for blurred vision, double vision and photophobia.  Respiratory: Negative for cough and shortness of breath.   Cardiovascular: Negative for chest pain, palpitations and leg swelling.  Gastrointestinal: Negative for abdominal pain, nausea and vomiting.  Genitourinary: Negative for flank pain.  Musculoskeletal: Positive for back pain and joint pain. Negative for myalgias.       Left hip and leg pain  Skin: Negative for itching and rash.  Neurological: Negative for tremors, focal weakness and weakness.  Endo/Heme/Allergies: Negative.   Psychiatric/Behavioral: Negative for depression.  All other systems reviewed and are negative.  Otherwise per HPI.  Assessment & Plan: Visit Diagnoses: No diagnosis found.  Plan: Findings:  Continued chronic recalcitrant low back and left hip and leg pain somewhat worse over the last couple of weeks with no inciting trauma.  Interestingly a few weeks ago there was a time where the [employer] wanted to reprogram her stimulator but the stimulator was not charged at that point and they reintroduced to her how to charge it and I want her to charge this over a few days and they could attempt reprogramming.  I am not sure this actually happened.  Kaylee from [employer] was here today.  She stated once again the stimulator was not charged and that she wants the patient to charge the stimulator  twice a day for 4 days and this was reiterated to the patient.  Once that happens Apolinar Junes will be in touch with her and make sure he is in close contact to see if they can reprogram.  At that point if it is not helpful we could look at diagnostic greater trochanteric injection may be just with Marcaine and/or Toradol and not use cortisone.  We could potentially use a very small amount of cortisone.  But for now the patient needs to  recharge the unit fully so that we can at least investigate if this is helping.  She was pretty adamant that she felt like it was nerve related pain and I think the spinal cord stimulator is her best chance as we have really failed care otherwise.    Meds & Orders: No orders of the defined types were placed in this encounter.  No orders of the defined types were placed in this encounter.   Follow-up: Return if symptoms worsen or fail to improve.   Procedures: No procedures performed  No notes on file   Clinical History: MRI LUMBAR SPINE WITHOUT CONTRAST  TECHNIQUE: Multiplanar, multisequence MR imaging of the lumbar spine was performed. No intravenous contrast was administered.  COMPARISON:  Prior MRI from 03/19/2015.  FINDINGS: Segmentation: Normal segmentation. Lowest well-formed disc labeled the L5-S1 level. Same numbering system employed as on previous exams.  Alignment: Stable levoscoliosis. Unchanged 3 mm retrolisthesis of T12 on L1, with 3 mm anterolisthesis of L3 on L4.  Vertebrae: Vertebral body heights are maintained without evidence for acute or interval fracture. Susceptibility artifact from prior fusion at L3-4 through L5-S1. Multilevel reactive endplate changes seen throughout the lumbar spine, most notable at the T12-L1, L1-2, and L5-S1 levels. Underlying bone marrow signal intensity within normal limits. Subcentimeter benign hemangioma noted within the T11 vertebral body. No worrisome osseous lesions.  Conus medullaris and cauda equina: Conus extends to the L1-2 level. Conus and cauda equina appear normal.  Paraspinal and other soft tissues: Paraspinous soft tissues demonstrate no acute abnormality. Visualized visceral structures within normal limits.  Disc levels:  T12-L1: 3 mm retrolisthesis. Progressive intervertebral disc space narrowing with diffuse disc bulge with chronic reactive endplate changes and resultant disc osteophyte slightly  eccentric to the right. Moderate facet and ligament flavum hypertrophy. Moderate to advanced right foraminal stenosis is relatively similar to previous. Slightly progressive moderate spinal stenosis with lateral recess narrowing.  L1-2: Diffuse disc bulge with disc desiccation and intervertebral disc space narrowing. Reactive endplate changes with marginal endplate osteophytic spurring. Bilateral facet hypertrophy, greater on the left. Probable remote postsurgical changes at this level. Stable mild canal with moderate left lateral recess narrowing. Mild bilateral foraminal stenosis, right greater than left, also stable.  L2-3: Diffuse disc bulge with disc desiccation and intervertebral disc space narrowing, progressed from previous. Mild reactive marginal endplate spurring. Moderate facet and ligamentum flavum hypertrophy. Mild canal and bilateral foraminal stenosis relatively similar.  L3-4: Postoperative changes from remote PLIF. No residual or recurrent canal or foraminal stenosis.  L4-5: Remote postoperative changes from previous PLIF. No residual or recurrent canal or foraminal stenosis.  L5-S1: Chronic intervertebral disc space narrowing with diffuse disc bulge and disc desiccation. Reactive endplate changes with marginal endplate osteophytic spurring. Remote right-sided laminotomy defect. Moderate facet hypertrophy. Resultant moderate lateral recess narrowing bilaterally, similar to previous. Moderate to severe left foraminal stenosis appears progressed from previous, with possible L5 nerve root impingement. Similar moderate right L5 foraminal narrowing.  IMPRESSION: 1. Multifactorial degenerative changes  at L5-S1 with resultant moderate to severe left L5 foraminal stenosis, slightly progressed from previous. 2. Progressive degenerative spondylolysis at T12-L1 with slightly worsened moderate spinal stenosis with similar moderate to advanced right foraminal  narrowing. 3. Stable chronic degenerative disc disease and facet hypertrophy at L1-2 and L2-3 with resultant lateral recess and foraminal narrowing as above.   Electronically Signed   By: Rise Mu M.D.   On: 04/04/2017 17:20   She reports that she has never smoked. She has never used smokeless tobacco. No results for input(s): HGBA1C, LABURIC in the last 8760 hours.  Objective:  VS:  HT:    WT:   BMI:     BP:(!) 145/75(manual BP)  HR:82bpm  TEMP: ( )  RESP:  Physical Exam  Constitutional: She is oriented to person, place, and time. She appears well-developed and well-nourished. No distress.  HENT:  Head: Normocephalic and atraumatic.  Nose: Nose normal.  Mouth/Throat: Oropharynx is clear and moist.  Eyes: Pupils are equal, round, and reactive to light. Conjunctivae are normal.  Neck: Normal range of motion. Neck supple.  Cardiovascular: Regular rhythm and intact distal pulses.  Pulmonary/Chest: Effort normal. No respiratory distress.  Abdominal: She exhibits no distension. There is no guarding.  Musculoskeletal:  Patient ambulates with an antalgic gait to the left.  She has some leftward curvature of the lumbar spine.  She has exquisite pain over the left greater trochanter that does mimic a lot of her pain.  She has some pain with extension of the lumbar spine and good distal strength.  Neurological: She is alert and oriented to person, place, and time. She exhibits normal muscle tone. Coordination normal.  Skin: Skin is warm. No rash noted. No erythema.  Psychiatric: Her behavior is normal.  Flat affect  Nursing note and vitals reviewed.   Ortho Exam Imaging: No results found.  Past Medical/Family/Surgical/Social History: Medications & Allergies reviewed per EMR, new medications updated. Patient Active Problem List   Diagnosis Date Noted  . Headache 02/23/2017  . Nausea & vomiting 02/23/2017  . Hyponatremia 02/23/2017  . Hypertensive urgency 02/23/2017   . Lumbar herniated disc 02/15/2015  . CKD (chronic kidney disease) stage 3, GFR 30-59 ml/min (HCC) 06/22/2014  . Spondylolisthesis of lumbar region 08/07/2013  . Hyperlipidemia 06/13/2008  . PAF (paroxysmal atrial fibrillation) (HCC) 06/13/2008  . Essential hypertension 11/05/2006  . Coronary atherosclerosis 11/05/2006   Past Medical History:  Diagnosis Date  . Arthritis   . CAD (coronary artery disease)    Mild per remote cath in 2004; normal nuclear in 2012  . Chronic kidney disease    stage3 kidney disease   . Complication of anesthesia    slow to wake up  . Diastolic dysfunction    Grade I, per echo in 2012  . Dysrhythmia   . GERD (gastroesophageal reflux disease)    prevacid  . H/O hiatal hernia   . Headache(784.0)    sinus  . High risk medication use    Flecainide therapy  . History of heartburn   . Hyperlipidemia   . Hypertension   . LVH (left ventricular hypertrophy)    Per echo in July 2012  . Normal nuclear stress test July 2012  . Obesity   . PAF (paroxysmal atrial fibrillation) (HCC)   . Pneumonia    20 yrs ago  . Syncope July 2012   Family History  Problem Relation Age of Onset  . Lung cancer Father    Past Surgical History:  Procedure  Laterality Date  . APPENDECTOMY  1952  . BACK SURGERY  1980, 2010   x2  . CARDIAC CATHETERIZATION  07/28/2002   EF GREATER THAN 55%; MILD CAD  . COLONOSCOPY    . DILATION AND CURETTAGE OF UTERUS    . LUMBAR LAMINECTOMY/DECOMPRESSION MICRODISCECTOMY Right 02/15/2015   Procedure: Right Thoracic twelve-Lumbar one  Microdiskectomy;  Surgeon: Tressie Stalker, MD;  Location: [facility];  Service: Neurosurgery;  Laterality: Right;  Thoracic/Lumbar  . TONSILLECTOMY     age 65 yrs  . TUBAL LIGATION     Social History   Occupational History  . Not on file  Tobacco Use  . Smoking status: Never Smoker  . Smokeless tobacco: Never Used  Substance and Sexual Activity  . Alcohol use: No  . Drug use: No  . Sexual  activity: Not on file

## 2017-10-11 NOTE — Progress Notes (Signed)
 .  Numeric Pain Rating Scale and Functional Assessment Average Pain 8 Pain Right Now 8 My pain is intermittent, sharp and stabbing Pain is worse with: walking and standing Pain improves with: rest and medication   In the last MONTH (on 0-10 scale) has pain interfered with the following?  1. General activity like being  able to carry out your everyday physical activities such as walking, climbing stairs, carrying groceries, or moving a chair?  Rating(7)  2. Relation with others like being able to carry out your usual social activities and roles such as  activities at home, at work and in your community. Rating(7)  3. Enjoyment of life such that you have  been bothered by emotional problems such as feeling anxious, depressed or irritable?  Rating(2)

## 2017-11-10 ENCOUNTER — Other Ambulatory Visit (HOSPITAL_COMMUNITY): Payer: Medicare Other

## 2017-11-10 ENCOUNTER — Emergency Department (HOSPITAL_COMMUNITY): Payer: Medicare Other

## 2017-11-10 ENCOUNTER — Inpatient Hospital Stay (HOSPITAL_COMMUNITY): Payer: Medicare Other

## 2017-11-10 ENCOUNTER — Inpatient Hospital Stay (HOSPITAL_COMMUNITY)
Admission: EM | Admit: 2017-11-10 | Discharge: 2017-11-14 | DRG: 065 | Disposition: A | Discharge status: Home Health | Payer: Medicare Other | Attending: Family Medicine | Admitting: Family Medicine

## 2017-11-10 ENCOUNTER — Encounter (HOSPITAL_COMMUNITY): Payer: Self-pay | Admitting: Emergency Medicine

## 2017-11-10 DIAGNOSIS — Z91138 Patient's unintentional underdosing of medication regimen for other reason: Secondary | ICD-10-CM | POA: Diagnosis not present

## 2017-11-10 DIAGNOSIS — Z801 Family history of malignant neoplasm of trachea, bronchus and lung: Secondary | ICD-10-CM

## 2017-11-10 DIAGNOSIS — I639 Cerebral infarction, unspecified: Secondary | ICD-10-CM | POA: Diagnosis present

## 2017-11-10 DIAGNOSIS — N183 Chronic kidney disease, stage 3 unspecified: Secondary | ICD-10-CM | POA: Diagnosis present

## 2017-11-10 DIAGNOSIS — I2510 Atherosclerotic heart disease of native coronary artery without angina pectoris: Secondary | ICD-10-CM | POA: Diagnosis present

## 2017-11-10 DIAGNOSIS — N179 Acute kidney failure, unspecified: Secondary | ICD-10-CM | POA: Diagnosis present

## 2017-11-10 DIAGNOSIS — R29705 NIHSS score 5: Secondary | ICD-10-CM | POA: Diagnosis present

## 2017-11-10 DIAGNOSIS — I77810 Thoracic aortic ectasia: Secondary | ICD-10-CM | POA: Diagnosis present

## 2017-11-10 DIAGNOSIS — I6312 Cerebral infarction due to embolism of basilar artery: Secondary | ICD-10-CM | POA: Diagnosis present

## 2017-11-10 DIAGNOSIS — I5030 Unspecified diastolic (congestive) heart failure: Secondary | ICD-10-CM | POA: Diagnosis not present

## 2017-11-10 DIAGNOSIS — I10 Essential (primary) hypertension: Secondary | ICD-10-CM | POA: Diagnosis present

## 2017-11-10 DIAGNOSIS — I161 Hypertensive emergency: Secondary | ICD-10-CM | POA: Diagnosis present

## 2017-11-10 DIAGNOSIS — R29810 Facial weakness: Secondary | ICD-10-CM | POA: Diagnosis present

## 2017-11-10 DIAGNOSIS — I129 Hypertensive chronic kidney disease with stage 1 through stage 4 chronic kidney disease, or unspecified chronic kidney disease: Secondary | ICD-10-CM | POA: Diagnosis present

## 2017-11-10 DIAGNOSIS — E876 Hypokalemia: Secondary | ICD-10-CM | POA: Diagnosis present

## 2017-11-10 DIAGNOSIS — R918 Other nonspecific abnormal finding of lung field: Secondary | ICD-10-CM | POA: Diagnosis not present

## 2017-11-10 DIAGNOSIS — T447X6A Underdosing of beta-adrenoreceptor antagonists, initial encounter: Secondary | ICD-10-CM | POA: Diagnosis present

## 2017-11-10 DIAGNOSIS — I480 Paroxysmal atrial fibrillation: Secondary | ICD-10-CM | POA: Diagnosis present

## 2017-11-10 DIAGNOSIS — G8192 Hemiplegia, unspecified affecting left dominant side: Secondary | ICD-10-CM | POA: Diagnosis present

## 2017-11-10 DIAGNOSIS — E785 Hyperlipidemia, unspecified: Secondary | ICD-10-CM | POA: Diagnosis present

## 2017-11-10 DIAGNOSIS — I671 Cerebral aneurysm, nonruptured: Secondary | ICD-10-CM | POA: Diagnosis present

## 2017-11-10 DIAGNOSIS — Z7982 Long term (current) use of aspirin: Secondary | ICD-10-CM | POA: Diagnosis not present

## 2017-11-10 DIAGNOSIS — E860 Dehydration: Secondary | ICD-10-CM | POA: Diagnosis present

## 2017-11-10 LAB — DIFFERENTIAL
Abs Immature Granulocytes: 0 10*3/uL (ref 0.0–0.1)
Basophils Absolute: 0 10*3/uL (ref 0.0–0.1)
Basophils Relative: 1 %
Eosinophils Absolute: 0.2 10*3/uL (ref 0.0–0.7)
Eosinophils Relative: 5 %
Immature Granulocytes: 0 %
LYMPHS PCT: 34 %
Lymphs Abs: 1.5 10*3/uL (ref 0.7–4.0)
MONOS PCT: 9 %
Monocytes Absolute: 0.4 10*3/uL (ref 0.1–1.0)
Neutro Abs: 2.4 10*3/uL (ref 1.7–7.7)
Neutrophils Relative %: 51 %

## 2017-11-10 LAB — I-STAT CHEM 8, ED
BUN: 24 mg/dL — AB (ref 8–23)
CALCIUM ION: 1.21 mmol/L (ref 1.15–1.40)
CREATININE: 1.3 mg/dL — AB (ref 0.44–1.00)
Chloride: 100 mmol/L (ref 98–111)
Glucose, Bld: 98 mg/dL (ref 70–99)
HCT: 38 % (ref 36.0–46.0)
Hemoglobin: 12.9 g/dL (ref 12.0–15.0)
Potassium: 3.8 mmol/L (ref 3.5–5.1)
Sodium: 135 mmol/L (ref 135–145)
TCO2: 24 mmol/L (ref 22–32)

## 2017-11-10 LAB — COMPREHENSIVE METABOLIC PANEL
ALBUMIN: 3.8 g/dL (ref 3.5–5.0)
ALT: 13 U/L (ref 0–44)
AST: 22 U/L (ref 15–41)
Alkaline Phosphatase: 60 U/L (ref 38–126)
Anion gap: 11 (ref 5–15)
BILIRUBIN TOTAL: 0.6 mg/dL (ref 0.3–1.2)
BUN: 22 mg/dL (ref 8–23)
CALCIUM: 9.6 mg/dL (ref 8.9–10.3)
CO2: 22 mmol/L (ref 22–32)
Chloride: 101 mmol/L (ref 98–111)
Creatinine, Ser: 1.19 mg/dL — ABNORMAL HIGH (ref 0.44–1.00)
GFR calc Af Amer: 48 mL/min — ABNORMAL LOW (ref >60)
GFR, EST NON AFRICAN AMERICAN: 42 mL/min — AB (ref >60)
GLUCOSE: 102 mg/dL — AB (ref 70–99)
Potassium: 3.8 mmol/L (ref 3.5–5.1)
Sodium: 134 mmol/L — ABNORMAL LOW (ref 135–145)
TOTAL PROTEIN: 6.1 g/dL — AB (ref 6.5–8.1)

## 2017-11-10 LAB — CBC
HEMATOCRIT: 37.6 % (ref 36.0–46.0)
HEMOGLOBIN: 12.7 g/dL (ref 12.0–15.0)
MCH: 31.8 pg (ref 26.0–34.0)
MCHC: 33.8 g/dL (ref 30.0–36.0)
MCV: 94.2 fL (ref 78.0–100.0)
Platelets: 221 10*3/uL (ref 150–400)
RBC: 3.99 MIL/uL (ref 3.87–5.11)
RDW: 12 % (ref 11.5–15.5)
WBC: 4.6 10*3/uL (ref 4.0–10.5)

## 2017-11-10 LAB — PROTIME-INR
INR: 0.9
Prothrombin Time: 12 seconds (ref 11.4–15.2)

## 2017-11-10 LAB — APTT: aPTT: 25 seconds (ref 24–36)

## 2017-11-10 LAB — I-STAT TROPONIN, ED: TROPONIN I, POC: 0.02 ng/mL (ref 0.00–0.08)

## 2017-11-10 LAB — MRSA PCR SCREENING: MRSA BY PCR: NEGATIVE

## 2017-11-10 MED ORDER — ASPIRIN 300 MG RE SUPP: 300.0000 mg | Freq: Every day | RECTAL | Status: DC

## 2017-11-10 MED ORDER — NICARDIPINE HCL IN NACL 20-0.86 MG/200ML-% IV SOLN
0.0000 mg/h | INTRAVENOUS | Status: DC
  Filled 2017-11-10: qty 200

## 2017-11-10 MED ORDER — ASPIRIN 81 MG PO CHEW
324.0000 mg | CHEWABLE_TABLET | Freq: Once | ORAL | Status: AC
  Administered 2017-11-10: 324 mg via ORAL
  Filled 2017-11-10: qty 4

## 2017-11-10 MED ORDER — METHOCARBAMOL 1000 MG/10ML IJ SOLN
500.0000 mg | Freq: Once | INTRAVENOUS | Status: DC
  Filled 2017-11-10: qty 5

## 2017-11-10 MED ORDER — LABETALOL HCL 5 MG/ML IV SOLN
10.0000 mg | Freq: Once | INTRAVENOUS | Status: AC
  Administered 2017-11-10: 10 mg via INTRAVENOUS
  Filled 2017-11-10: qty 4

## 2017-11-10 MED ORDER — HEPARIN SODIUM (PORCINE) 5000 UNIT/ML IJ SOLN
5000.0000 [IU] | Freq: Three times a day (TID) | INTRAMUSCULAR | Status: DC
  Administered 2017-11-10 – 2017-11-12 (×5): 5000 [IU] via SUBCUTANEOUS
  Filled 2017-11-10 (×7): qty 1

## 2017-11-10 MED ORDER — HYDRALAZINE HCL 20 MG/ML IJ SOLN
10.0000 mg | INTRAMUSCULAR | Status: DC | PRN
  Administered 2017-11-10: 10 mg via INTRAVENOUS
  Filled 2017-11-10: qty 1

## 2017-11-10 MED ORDER — IOPAMIDOL (ISOVUE-370) INJECTION 76%
50.0000 mL | Freq: Once | INTRAVENOUS | Status: AC | PRN
  Administered 2017-11-10: 50 mL via INTRAVENOUS

## 2017-11-10 MED ORDER — ACETAMINOPHEN 650 MG RE SUPP: 650.0000 mg | RECTAL | Status: DC | PRN

## 2017-11-10 MED ORDER — FLECAINIDE ACETATE 50 MG PO TABS
50.0000 mg | ORAL_TABLET | Freq: Every day | ORAL | Status: DC
  Administered 2017-11-11 – 2017-11-14 (×4): 50 mg via ORAL
  Filled 2017-11-10 (×5): qty 1

## 2017-11-10 MED ORDER — KETOROLAC TROMETHAMINE 30 MG/ML IJ SOLN
15.0000 mg | Freq: Once | INTRAMUSCULAR | Status: AC
  Administered 2017-11-10: 15 mg via INTRAVENOUS
  Filled 2017-11-10: qty 1

## 2017-11-10 MED ORDER — ACETAMINOPHEN 160 MG/5ML PO SOLN: 650.0000 mg | ORAL | Status: DC | PRN

## 2017-11-10 MED ORDER — BACLOFEN 10 MG PO TABS
5.0000 mg | ORAL_TABLET | Freq: Two times a day (BID) | ORAL | Status: DC | PRN
  Administered 2017-11-11: 5 mg via ORAL
  Filled 2017-11-10: qty 1

## 2017-11-10 MED ORDER — IOPAMIDOL (ISOVUE-370) INJECTION 76%
INTRAVENOUS | Status: AC
  Filled 2017-11-10: qty 50

## 2017-11-10 MED ORDER — CLEVIDIPINE BUTYRATE 0.5 MG/ML IV EMUL
0.0000 mg/h | INTRAVENOUS | Status: DC
  Filled 2017-11-10: qty 50

## 2017-11-10 MED ORDER — ACETAMINOPHEN 325 MG PO TABS
650.0000 mg | ORAL_TABLET | ORAL | Status: DC | PRN
  Administered 2017-11-11: 650 mg via ORAL
  Filled 2017-11-10 (×3): qty 2

## 2017-11-10 MED ORDER — SODIUM CHLORIDE 0.9 % IV SOLN: INTRAVENOUS | Status: DC

## 2017-11-10 MED ORDER — ASPIRIN 325 MG PO TABS
325.0000 mg | ORAL_TABLET | Freq: Every day | ORAL | Status: DC
  Filled 2017-11-10: qty 1

## 2017-11-10 MED ORDER — SENNOSIDES-DOCUSATE SODIUM 8.6-50 MG PO TABS
1.0000 | ORAL_TABLET | Freq: Every evening | ORAL | Status: DC | PRN
  Administered 2017-11-12: 1 via ORAL
  Filled 2017-11-10: qty 1

## 2017-11-10 MED ORDER — CLONIDINE HCL 0.1 MG PO TABS
0.1000 mg | ORAL_TABLET | Freq: Two times a day (BID) | ORAL | Status: DC
  Administered 2017-11-11: 0.1 mg via ORAL
  Filled 2017-11-10 (×2): qty 1

## 2017-11-10 MED ORDER — ASPIRIN EC 81 MG PO TBEC
81.0000 mg | DELAYED_RELEASE_TABLET | Freq: Every day | ORAL | Status: DC
  Administered 2017-11-11 – 2017-11-12 (×2): 81 mg via ORAL
  Filled 2017-11-10 (×2): qty 1

## 2017-11-10 MED ORDER — SODIUM CHLORIDE 0.9 % IV SOLN
INTRAVENOUS | Status: DC
  Administered 2017-11-10: 20:00:00 via INTRAVENOUS

## 2017-11-10 MED ORDER — STROKE: EARLY STAGES OF RECOVERY BOOK
Freq: Once | Status: AC
  Administered 2017-11-11: 18:00:00
  Filled 2017-11-10: qty 1

## 2017-11-10 MED ORDER — ONDANSETRON HCL 4 MG/2ML IJ SOLN
4.0000 mg | Freq: Four times a day (QID) | INTRAMUSCULAR | Status: DC | PRN
  Administered 2017-11-10 – 2017-11-11 (×2): 4 mg via INTRAVENOUS
  Filled 2017-11-10 (×2): qty 2

## 2017-11-10 MED ORDER — HYDRALAZINE HCL 20 MG/ML IJ SOLN: 5.0000 mg | INTRAMUSCULAR | Status: DC | PRN

## 2017-11-10 NOTE — Progress Notes (Signed)
Interim Note  Called by RR Nurse that patient having headache, nausea and intermittent left leg spasms. Stat CT head revealed right basal ganglia infarct.   Recommend BP closer to 170- 180 systolic rather than permissive HTN upto 220/120 mmHg as she appears to be having symptomatic hypertensive emergency with headache and nausea. Left leg spasms do not appear like seizures. Will add baclofen PRN.

## 2017-11-10 NOTE — ED Notes (Signed)
Pt placed on pure wick at this time.  

## 2017-11-10 NOTE — ED Notes (Signed)
Family at bedside. 

## 2017-11-10 NOTE — Progress Notes (Signed)
RN paged Dr. Laurence Slate as requested to update him on patient's condition.  RN notified him that BP is now 215/97 and left leg continues to spasm.  RN also notified him that CT results are back, awaiting response.  [name], RN

## 2017-11-10 NOTE — Significant Event (Addendum)
Rapid Response Event Note  Overview: Time Called: 1941 Arrival Time: 1944 Event Type: Neurologic  Initial Focused Assessment: Called by bedside RNs about patient having acute onset of HA coupled with N/V along with worsening facial droop, slurred speech, and LEFT lower extremity jerking.  Per RNs, this was an acute change at around 1930. SBP was in the 230s and RN treated with Hydralazine 5mg . Per RNs, patient stated " something isnt't right and something feels bed". I asked the that RNs obtain an EKG/VS, including a manual BP.  Upon arrival, patient is alert, able to follow commands. NIH 5 now. 4/5 weakness in LUE and LLE, left grip is weaker, and LLE is "jerking" and "twitching", unsure if this seizure like or muscular. Upon asking about sensory loss, patient stated that she felt that the left side felt different.   I paged [facility] NP and Neuro MD on call at 2005.   Interventions: - STAT EKG - no acute changes that I appreciated. - STAT CT HEAD - NS 100cc/hr started - HA and N/V - Toradol 15 mg x 1 and Zofran 4mg  PRN ordered as well  Plan of Care: - Patient taken to CT (+ acute RIGHT Basal Ganglia Infarct). - Keep SBP 160-180 per Neuro MD - NS cc 100/hr til AM - RN to follow up with Neuro MD in one hour with update. If LLE movements are consistent, might add seizure prophylaxis medications. - 2230 - ongoing HA and N/V along with SBP > 210. RN to treat now, if no improvements, might need to go to ICU for Cardene Infusion   Event Summary: Name of Physician Notified: [facility] NP [place] at 2005  Name of Consulting Physician Notified: Dr. Laurence Slate (Neuro MD) at 2005  Outcome: Stayed in room and stabalized  Event End Time: 2145  Windy Carina

## 2017-11-10 NOTE — ED Notes (Signed)
Patient transported to MRI 

## 2017-11-10 NOTE — Progress Notes (Signed)
11/10/17   Pt has unspecified spinal cord stimulator. Need MR safety info in order to determine if implant is conditional for scanning.  RN aware

## 2017-11-10 NOTE — Progress Notes (Signed)
RN paged Linton Flemings, NP to discuss if patient should take Clonidine as scheduled.  NP returned call and instructed RN to hold Clonidine to avoid BP dropping too quickly.  NP also instructed RN to page if systolic BP is 210 or higher.  RN will continue to monitor patient and make MD aware of changes as needed. P.J. Alleya Demeter,RN

## 2017-11-10 NOTE — Consult Note (Addendum)
NEURO HOSPITALIST  CONSULT     Requesting Physician: Dr. Jacqulyn Bath    Chief Complaint: Left side weakness and left facial droop   History obtained from:  Patient     HPI:                                                                                                                                         Cheryl [name] is an 83 y.o. female PMH significant for HTN, HLD, paroxysmal a. Fib ( not anticoagulated), CAD, CKD who presented to [facility] ED as a code stroke for left facial droop and left sided weakness.    Per patient when she went to bed at midnight she was completely normal. When she woke up at 0500 to use the restroom she felt wobble and was having some weakness on the left side. She thought she was just sleepy so she went back to bed. Woke up at 0900 and the symptoms still persisted. She said she is wobbly when walking, weak in the knees and " just does not feel right". Patient lives with her husband who has alzheimer's. 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 Denies any SOB, CP, vision problems, numbness, tingling, HA, ETOH, smoking or drug abuse.No prior stroke history.  ED course:  BP: 144/100 BG: 106 creatinine:1.30, BUN: 24, GFR: 42 CT Head:  Normal for age. No acute findings    Date last known well: Date: 11/10/2017 Time last known well: Time: 00:00 tPA Given: No: outside of window Modified Rankin: Rankin Score=0  NIHSS:5 1a Level of Conscious:0 1b LOC Questions:0 1c LOC Commands: 0 2 Best Gaze: 0 3 Visual: 0 4 Facial Palsy: 1 5a Motor Arm - left: 1 5b Motor Arm - Right:0  6a Motor Leg - Left: 1 6b Motor Leg - Right: 0 7 Limb Ataxia: 1 8 Sensory: 0 9 Best Language: 0 10 Dysarthria:1 11 Extinct. and Inattention:0 TOTAL: 5    Past Medical History:  Diagnosis Date  . Arthritis   . CAD (coronary artery disease)    Mild per remote cath in 2004; normal nuclear in 2012  . Chronic kidney disease     stage3 kidney disease   . Complication of anesthesia    slow to wake up  . Diastolic dysfunction    Grade I, per echo in 2012  . Dysrhythmia   . GERD (gastroesophageal reflux disease)    prevacid  . H/O hiatal hernia   . Headache(784.0)    sinus  . High risk medication use    Flecainide therapy  . History of heartburn   .  Hyperlipidemia   . Hypertension   . LVH (left ventricular hypertrophy)    Per echo in July 2012  . Normal nuclear stress test July 2012  . Obesity   . PAF (paroxysmal atrial fibrillation) (HCC)   . Pneumonia    20 yrs ago  . Syncope July 2012    Past Surgical History:  Procedure Laterality Date  . APPENDECTOMY  1952  . BACK SURGERY  1980, 2010   x2  . CARDIAC CATHETERIZATION  07/28/2002   EF GREATER THAN 55%; MILD CAD  . COLONOSCOPY    . DILATION AND CURETTAGE OF UTERUS    . LUMBAR LAMINECTOMY/DECOMPRESSION MICRODISCECTOMY Right 02/15/2015   Procedure: Right Thoracic twelve-Lumbar one  Microdiskectomy;  Surgeon: Tressie Stalker, MD;  Location: [facility];  Service: Neurosurgery;  Laterality: Right;  Thoracic/Lumbar  . TONSILLECTOMY     age 43 yrs  . TUBAL LIGATION      Family History  Problem Relation Age of Onset  . Lung cancer Father          Social History:  reports that she has never smoked. She has never used smokeless tobacco. She reports that she does not drink alcohol or use drugs.  Allergies:  Allergies  Allergen Reactions  . Amlodipine Besylate     REACTION: dizzy  . Codeine Phosphate     headache  . Lipitor [Atorvastatin Calcium]     Muscle pain and tiredness  . Sulfonamide Derivatives     REACTION: unspecified    Medications:                                                                                                                           Current Facility-Administered Medications  Medication Dose Route Frequency Provider Last Rate Last Dose  . iopamidol (ISOVUE-370) 76 % injection 50 mL  50 mL Intravenous Once PRN  Rejeana Brock, MD      . iopamidol (ISOVUE-370) 76 % injection           . lidocaine (PF) (XYLOCAINE) 1 % injection 10 mL  10 mL Other Once Tyrell Antonio, MD       Current Outpatient Medications  Medication Sig Dispense Refill  . acetaminophen (TYLENOL) 500 MG tablet Take 500-1,000 mg by mouth every 6 (six) hours as needed for mild pain.    Marland Kitchen aspirin EC 81 MG tablet Take 81 mg by mouth daily.    . Biotin 10 MG CAPS Take 10 mg by mouth daily.    . cephALEXin (KEFLEX) 500 MG capsule Take 1 capsule (500 mg total) by mouth 2 (two) times daily. 14 capsule 0  . Cholecalciferol (VITAMIN D) 2000 UNITS tablet Take 2,000 Units by mouth daily.      . cloNIDine (CATAPRES) 0.1 MG tablet Take 1 tablet (0.1 mg total) by mouth daily. 30 tablet 0  . diazepam (VALIUM) 5 MG tablet Take 1 by mouth 1 to 2 hours pre-procedure. May repeat if necessary. 2  tablet 0  . docusate sodium (COLACE) 100 MG capsule Take 1 capsule (100 mg total) by mouth 2 (two) times daily. (Patient taking differently: Take 100 mg by mouth 2 (two) times daily as needed for mild constipation. ) 60 capsule 0  . flecainide (TAMBOCOR) 50 MG tablet take 1 tablet by mouth once daily 90 tablet 3  . ibuprofen (ADVIL,MOTRIN) 200 MG tablet Take 400 mg by mouth every 6 (six) hours as needed for moderate pain.    Marland Kitchen lisinopril (PRINIVIL,ZESTRIL) 20 MG tablet Take 1 tablet (20 mg total) by mouth daily. 30 tablet 0  . loratadine (CLARITIN) 10 MG tablet Take 10 mg by mouth daily as needed for allergies.    . metoprolol succinate (TOPROL-XL) 50 MG 24 hr tablet Take 1 tablet (50 mg total) by mouth daily. 30 tablet 0  . traZODone (DESYREL) 100 MG tablet Take 100 mg by mouth at bedtime.         ROS:                                                                                                                                       ROS was performed and is negative except as noted in HPI  General Examination:                                                                                                       There were no vitals taken for this visit.  HEENT-  Normocephalic, no lesions, without obvious abnormality.  Normal external eye and conjunctiva.  Cardiovascular- S1-S2 audible, pulses palpable throughout   Lungs-no rhonchi or wheezing noted, no excessive working breathing.  Saturations within normal limits on RA Abdomen- BS present x 4 quadrants Extremities- Warm, dry and intact Musculoskeletal-no joint tenderness, deformity or swelling Skin-warm and dry, no hyperpigmentation, vitiligo, or suspicious lesions  Neurological Examination Mental Status: Alert, oriented, thought content appropriate.  Speech fluent without evidence of aphasia.  Mild dysarthria noted.  Able to follow  commands without difficulty. Cranial Nerves: II:  Visual fields grossly normal,  III,IV, VI: ptosis not present, extra-ocular motions intact bilaterally, pupils equal, round, reactive to light and accommodation V,VII: smile asymmetric, left facial droop, cool temp and facial light touch sensation normal bilaterally VIII: hearing normal bilaterally IX,X: uvula rises symmetrically XI: bilateral shoulder shrug XII: midline tongue extension Motor: Right : Upper extremity   5/5  Left:     Upper extremity   4/5  Lower extremity  5/5   Lower extremity   4/5 Tone and bulk:normal tone throughout; no atrophy noted Sensory: cool temp and light touch intact throughout, bilaterally Deep Tendon Reflexes: 2+ and symmetric biceps and patella Plantars: Right: downgoing   Left: downgoing Cerebellar: normal finger-to-nose, normal rapid alternating movements and normal heel-to-shin test Gait: deferred   Lab Results: Basic Metabolic Panel: Recent Labs  Lab 11/10/17 1040  NA 135  K 3.8  CL 100  GLUCOSE 98  BUN 24*  CREATININE 1.30*    CBC: Recent Labs  Lab 11/10/17 1036 11/10/17 1040  WBC 4.6  --   NEUTROABS 2.4  --   HGB 12.7 12.9  HCT 37.6 38.0   MCV 94.2  --   PLT 221  --    Imaging: Ct Head Code Stroke Wo Contrast  Result Date: 11/10/2017 CLINICAL DATA:  Code stroke. 83 year old female with left side weakness. EXAM: CT HEAD WITHOUT CONTRAST TECHNIQUE: Contiguous axial images were obtained from the base of the skull through the vertex without intravenous contrast. COMPARISON:  Head CT 02/23/2017.  Brain MRI 03/04/2015. FINDINGS: Brain: Cerebral volume is stable and normal for age. No midline shift, mass effect, or evidence of intracranial mass lesion. No ventriculomegaly. No acute intracranial hemorrhage identified. Stable and normal for age gray-white matter differentiation throughout the brain. No cortically based acute infarct identified. No cortical encephalomalacia identified. Vascular: Calcified atherosclerosis at the skull base. The major intracranial vascular structures appear stable. No suspicious intracranial vascular hyperdensity. Skull: Stable and negative. Sinuses/Orbits: Stable and generally well pneumatized paranasal sinuses. Chronic left sphenoid mucosal thickening. Tympanic cavities and mastoids are clear. Other: Stable and negative orbit and scalp soft tissues. ASPECTS ([place] Stroke Program Early CT Score) - Ganglionic level infarction (caudate, lentiform nuclei, internal capsule, insula, M1-M3 cortex): 7 - Supraganglionic infarction (M4-M6 cortex): 3 Total score (0-10 with 10 being normal): 10 IMPRESSION: 1. Stable and normal for age noncontrast CT appearance of the brain. 2. ASPECTS is 10. 3. These results were communicated to Dr. Amada Jupiter at 11:14 amon 10/5/2019by text page via the [facility] messaging system. Electronically Signed   By: Odessa Fleming M.D.   On: 11/10/2017 11:15      Valentina Lucks, MSN, NP-C [facility] [phone]   11/10/2017, 10:50 AM   Attending physician note to follow with Assessment and plan .   Assessment: 83 y.o. female PMH significant for HTN, HLD, paroxysmal a. Fib ( not  anticoagulated), CAD, CKD who presented to [facility] ED as a code stroke for left facial droop and left sided weakness.  Her exam is most consistent with a small subcortical infarct.  She has atrial fibrillation but that has not been on anticoagulation due to patient refusal despite her cardiologist advising it.  Impression: Stroke Risk Factors - atrial fibrillation, hyperlipidemia and hypertension    Recommendations: -- BP goal : Permissive HTN upto 220/110 mmHg  --repeat head CT 10/6 --Echocardiogram -- ASA 81 mg, likely will need to be transitioned to anticoagulation. -- High intensity Statin if LDL > 70 -- HgbA1c, fasting lipid panel -- PT consult, OT consult, Speech consult --Telemetry monitoring --Frequent neuro checks --Stroke swallow screen  --please page stroke NP  Or  PA  Or MD from 8am -4 pm  as this patient from this time will be  followed by the stroke.   You can look them up on [email or web address]  Password [email or web address]  Ritta Slot, MD [facility] [phone]  If 7pm- 7am, please page neurology on call as listed in  AMION.

## 2017-11-10 NOTE — ED Notes (Signed)
MD made aware of BP, orders received.

## 2017-11-10 NOTE — H&P (Signed)
TRH H&P   Patient Demographics:    Bobbiejo Ishikawa, is a 83 y.o. female  MRN: [id number]   DOB - Apr 10, 1934  Admit Date - 11/10/2017  Outpatient Primary MD for the patient is Laurann Montana, MD  Referring MD/NP/PA: Dr Jacqulyn Bath  Patient coming from: home  Chief Complaint  Patient presents with  . Code Stroke      HPI:    Mylani Gentry  is a 83 y.o. female,  with medical history significant for coronary artery disease, chronic kidney disease stage III, hypertension, and paroxysmal atrial fibrillation, patient presents to ED with complaints of left-sided weakness and facial droop, reports she went to bed at midnight at her usual state of health, but she woke up around 5 AM to use the restroom, she felt wobbly with some weakness on the left side, so this is because she was sleepy, went back to bed, woke up around 9 AM with these weakness has persisted, noted her to come to ED, he denies any chest pain, shortness of breath, any visual problems, any tingling, numbness, headache, she does not smoke, does not use alcohol, pressure was elevated in ED, daughter tells me she is out of her medicine for last 3 days. -ED blood pressure significantly uncontrolled to 63/150, daughter report she is out of her meds for last 3 days, PA head and neck significant for no large vessel disease, EEG showing normal sinus rhythm, she is known with history of paroxysmal A. fib in the past, she received full dose aspirin and I was called to admit.    Review of systems:    In addition to the HPI above,  No Fever-chills, No Headache, No changes with Vision or hearing, No problems swallowing food or Liquids, No Chest pain, Cough or Shortness of Breath, No Abdominal pain, No Nausea or Vommitting, Bowel movements are regular, No Blood in stool or Urine, No dysuria, No new skin rashes or bruises, No new joints  pains-aches,  Reports left-sided weakness No recent weight gain or loss, No polyuria, polydypsia or polyphagia, No significant Mental Stressors.  A full 10 point Review of Systems was done, except as stated above, all other Review of Systems were negative.   With Past History of the following :    Past Medical History:  Diagnosis Date  . Arthritis   . CAD (coronary artery disease)    Mild per remote cath in 2004; normal nuclear in 2012  . Chronic kidney disease    stage3 kidney disease   . Complication of anesthesia    slow to wake up  . Diastolic dysfunction    Grade I, per echo in 2012  . Dysrhythmia   . GERD (gastroesophageal reflux disease)    prevacid  . H/O hiatal hernia   . Headache(784.0)    sinus  . High risk medication use    Flecainide therapy  .  History of heartburn   . Hyperlipidemia   . Hypertension   . LVH (left ventricular hypertrophy)    Per echo in July 2012  . Normal nuclear stress test July 2012  . Obesity   . PAF (paroxysmal atrial fibrillation) (HCC)   . Pneumonia    20 yrs ago  . Syncope July 2012      Past Surgical History:  Procedure Laterality Date  . APPENDECTOMY  1952  . BACK SURGERY  1980, 2010   x2  . CARDIAC CATHETERIZATION  07/28/2002   EF GREATER THAN 55%; MILD CAD  . COLONOSCOPY    . DILATION AND CURETTAGE OF UTERUS    . LUMBAR LAMINECTOMY/DECOMPRESSION MICRODISCECTOMY Right 02/15/2015   Procedure: Right Thoracic twelve-Lumbar one  Microdiskectomy;  Surgeon: Tressie Stalker, MD;  Location: [facility];  Service: Neurosurgery;  Laterality: Right;  Thoracic/Lumbar  . TONSILLECTOMY     age 67 yrs  . TUBAL LIGATION        Social History:     Social History   Tobacco Use  . Smoking status: Never Smoker  . Smokeless tobacco: Never Used  Substance Use Topics  . Alcohol use: No     Lives -at home with husband  Mobility -dependent     Family History :     Family History  Problem Relation Age of Onset  . Lung  cancer Father       Home Medications:   Prior to Admission medications   Medication Sig Start Date End Date Taking? Authorizing Provider  acetaminophen (TYLENOL) 500 MG tablet Take 500-1,000 mg by mouth every 6 (six) hours as needed for mild pain.   Yes [provider]  aspirin EC 81 MG tablet Take 81 mg by mouth daily.   Yes [provider]  Biotin 10 MG CAPS Take 10 mg by mouth daily.   Yes [provider]  Cholecalciferol (VITAMIN D) 2000 UNITS tablet Take 2,000 Units by mouth daily.     Yes [provider]  docusate sodium (COLACE) 100 MG capsule Take 1 capsule (100 mg total) by mouth 2 (two) times daily. Patient taking differently: Take 100 mg by mouth 2 (two) times daily as needed for mild constipation.  02/16/15  Yes Tressie Stalker, MD  flecainide (TAMBOCOR) 50 MG tablet take 1 tablet by mouth once daily Patient taking differently: Take 50 mg by mouth once.  03/07/17  Yes Jake Bathe, MD  ibuprofen (ADVIL,MOTRIN) 200 MG tablet Take 400 mg by mouth every 6 (six) hours as needed for moderate pain.   Yes [provider]  loratadine (CLARITIN) 10 MG tablet Take 10 mg by mouth daily as needed for allergies.   Yes [provider]  metoprolol succinate (TOPROL-XL) 50 MG 24 hr tablet Take 1 tablet (50 mg total) by mouth daily. 02/26/17  Yes Noralee Stain, DO  traZODone (DESYREL) 100 MG tablet Take 100 mg by mouth at bedtime.     Yes [provider]  cloNIDine (CATAPRES) 0.1 MG tablet Take 1 tablet (0.1 mg total) by mouth daily. Patient not taking: Reported on 11/10/2017 02/27/17   Noralee Stain, DO  diazepam (VALIUM) 5 MG tablet Take 1 by mouth 1 to 2 hours pre-procedure. May repeat if necessary. Patient not taking: Reported on 11/10/2017 07/05/17   Tyrell Antonio, MD  lisinopril (PRINIVIL,ZESTRIL) 20 MG tablet Take 1 tablet (20 mg total) by mouth daily. Patient not taking: Reported on 11/10/2017 02/27/17   Noralee Stain, DO  Allergies:     Allergies  Allergen Reactions  . Amlodipine Besylate     REACTION: dizzy  . Codeine Phosphate     headache  . Lipitor [Atorvastatin Calcium]     Muscle pain and tiredness  . Sulfonamide Derivatives     REACTION: unspecified     Physical Exam:   Vitals  Blood pressure (!) 231/80, pulse 61, temperature 97.7 F (36.5 C), temperature source Oral, resp. rate 19, SpO2 96 %.   1. General developed female laying in bed in no apparent distress  2. Normal affect and insight, Not Suicidal or Homicidal, Awake Alert, Oriented X 3.  3.  Left facial droop, tongue deviated to the left, has left-sided weakness to 5 left upper, 3-4 out of 5 left lower , right side with no weakness .  4. Ears and Eyes appear Normal, Conjunctivae clear, PERRLA. Moist Oral Mucosa.  5. Supple Neck, No JVD, No cervical lymphadenopathy appriciated, No Carotid Bruits.  6. Symmetrical Chest wall movement, Good air movement bilaterally, CTAB.  7. RRR, No Gallops, Rubs or Murmurs, No Parasternal Heave.  8. Positive Bowel Sounds, Abdomen Soft, No tenderness, No organomegaly appriciated,No rebound -guarding or rigidity.  9.  No Cyanosis, Normal Skin Turgor, No Skin Rash or Bruise.  10. Good muscle tone,  joints appear normal , no effusions, Normal ROM.  11. No Palpable Lymph Nodes in Neck or Axillae     Data Review:    CBC Recent Labs  Lab 11/10/17 1036 11/10/17 1040  WBC 4.6  --   HGB 12.7 12.9  HCT 37.6 38.0  PLT 221  --   MCV 94.2  --   MCH 31.8  --   MCHC 33.8  --   RDW 12.0  --   LYMPHSABS 1.5  --   MONOABS 0.4  --   EOSABS 0.2  --   BASOSABS 0.0  --    ------------------------------------------------------------------------------------------------------------------  Chemistries  Recent Labs  Lab 11/10/17 1036 11/10/17 1040  NA 134* 135  K 3.8 3.8  CL 101 100  CO2 22  --   GLUCOSE 102* 98  BUN 22 24*  CREATININE 1.19* 1.30*  CALCIUM 9.6  --   AST 22  --    ALT 13  --   ALKPHOS 60  --   BILITOT 0.6  --    ------------------------------------------------------------------------------------------------------------------ CrCl cannot be calculated (Unknown ideal weight.). ------------------------------------------------------------------------------------------------------------------ No results for input(s): TSH, T4TOTAL, T3FREE, THYROIDAB in the last 72 hours.  Invalid input(s): FREET3  Coagulation profile Recent Labs  Lab 11/10/17 1036  INR 0.90   ------------------------------------------------------------------------------------------------------------------- No results for input(s): DDIMER in the last 72 hours. -------------------------------------------------------------------------------------------------------------------  Cardiac Enzymes No results for input(s): CKMB, TROPONINI, MYOGLOBIN in the last 168 hours.  Invalid input(s): CK ------------------------------------------------------------------------------------------------------------------ No results found for: BNP   ---------------------------------------------------------------------------------------------------------------  Urinalysis    Component Value Date/Time   COLORURINE YELLOW 03/04/2015 1427   APPEARANCEUR CLEAR 03/04/2015 1427   LABSPEC 1.009 03/04/2015 1427   PHURINE 8.0 03/04/2015 1427   GLUCOSEU NEGATIVE 03/04/2015 1427   HGBUR NEGATIVE 03/04/2015 1427   BILIRUBINUR NEGATIVE 03/04/2015 1427   KETONESUR NEGATIVE 03/04/2015 1427   PROTEINUR 100 (A) 03/04/2015 1427   UROBILINOGEN 0.2 08/05/2008 1450   NITRITE NEGATIVE 03/04/2015 1427   LEUKOCYTESUR NEGATIVE 03/04/2015 1427    ----------------------------------------------------------------------------------------------------------------   Imaging Results:    Ct Angio Head W Or Wo Contrast  Result Date: 11/10/2017 CLINICAL DATA:  83 year old female code stroke. Dizziness, headache, left side  weakness.  EXAM: CT ANGIOGRAPHY HEAD AND NECK TECHNIQUE: Multidetector CT imaging of the head and neck was performed using the standard protocol during bolus administration of intravenous contrast. Multiplanar CT image reconstructions and MIPs were obtained to evaluate the vascular anatomy. Carotid stenosis measurements (when applicable) are obtained utilizing NASCET criteria, using the distal internal carotid diameter as the denominator. CONTRAST:  50mL ISOVUE-370 IOPAMIDOL (ISOVUE-370) INJECTION 76%, <See Chart> ISOVUE-370 IOPAMIDOL (ISOVUE-370) INJECTION 76% COMPARISON:  Head CT without contrast 1050 hours today. Brain MRI 03/04/2015. Chest CTA 03/09/2016. FINDINGS: CTA NECK Skeleton: Widespread cervical spine degeneration. Advanced left sternoclavicular joint degeneration. No acute osseous abnormality identified. Upper chest: Mosaic attenuation in both upper lungs. Superimposed reticulonodular apical interstitial opacity, worse on the right (series 5, image 28). This is new since 2018. Visible superior mediastinal lymph nodes appear stable. Other neck: Multiple hypodense thyroid nodules, likely benign. Negative for neck mass or lymphadenopathy. Aortic arch: 3 vessel arch configuration. Fusiform enlargement of the arch up to 31 millimeters diameter. Visible ascending aorta is 34 millimeters diameter. Proximal descending aorta appears to slowly taper. Mostly soft arch atherosclerotic plaque without great vessel origin stenosis. Right carotid system: Mildly tortuous right CCA. Minimal CCA and right carotid bifurcation plaque with no stenosis. Tortuous cervical right ICA, and the vessel has a beaded appearance along with fusiform aneurysmal enlargement, 7-9 millimeters diameter at the C1 level (series 8 image 28). No stenosis. Left carotid system: Minimal left CCA and left carotid bifurcation atherosclerosis without stenosis. Tortuous left ICA distal to the bulb with a beaded appearance to the skull base (series 8,  images 29 and 31). No stenosis. Vertebral arteries: No proximal right subclavian artery or right vertebral artery origin stenosis. Tortuous right V1 segment with a mildly kinked appearance. Mildly beaded appearance of the right V3 segment but no stenosis to the skull base. No proximal left subclavian artery or left vertebral artery stenosis with minimal plaque. Patent left vertebral artery to the skull base. Mildly to moderately beaded V3 segment appearance noted. There is a small 2-3 millimeter saccular lesion directed posteriorly from the vessel as it crosses the dura on series 6, image 240. CTA HEAD Posterior circulation: Mildly beaded appearance of the bilateral V4 vertebral artery segments. The left is mildly dominant. Both PICA origins are patent. No distal vertebral stenosis. Patent vertebrobasilar junction and basilar artery without stenosis. Mild basilar tortuosity. SCA and PCA origins are normal. Posterior communicating arteries are diminutive or absent. Both P1 segments are tortuous but without stenosis. There are tandem severe stenoses of the left PCA P2 and P3 segments seen on series 9, image 71. There is preserved distal enhancement. On the right there is moderate P2 segment stenosis with attenuated distal right PCA enhancement. Anterior circulation: Both ICA siphons are patent. On the left there is mild calcified plaque without stenosis. On the right there is mild calcified plaque without stenosis. Normal ophthalmic artery origins. Patent carotid termini. The right A1 segment is dominant, the left is diminutive. ACA origins, anterior communicating artery, and bilateral ACA branches are within normal limits. Left MCA M1 segment, bifurcation, and left MCA branches are within normal limits. Right MCA M1 segment is patent without stenosis. Right MCA bifurcation is patent. No right MCA branch occlusion or high-grade stenosis is identified. Venous sinuses: Patent. Anatomic variants: Mildly dominant left  vertebral artery. Review of the MIP images confirms the above findings IMPRESSION: 1. Negative for large vessel occlusion, but there are moderate to severe bilateral PCA P2/P3 segment stenoses worse on the left.  2. Positive also for bilateral ICA and distal vertebral artery Fibromuscular Dysplasia (FMD). Distal cervical Right ICA involvement including fusiform aneurysmal enlargement of the vessel 7-9 mm diameter. 3. Positive also for a small 2-3 mm aneurysm versus infundibulum of the distal Left Vertebral Artery directed posteriorly as it crosses the dura. See series 6, image 240. 4. Abnormal right upper lobe reticulonodular pulmonary opacity is nonspecific. Recommend dedicated Chest CT. 5. Ectatic Ascending Aorta and arch, 31-34 mm diameter. Recommend annual imaging followup by CTA or MRA. This recommendation follows 2010 ACCF/AHA/AATS/ACR/ASA/SCA/SCAI/SIR/STS/SVM Guidelines for the Diagnosis and Management of Patients with Thoracic Aortic Disease. Circulation.2010; 121: U725-D664 Electronically Signed   By: Odessa Fleming M.D.   On: 11/10/2017 11:30   Ct Angio Neck W Or Wo Contrast  Result Date: 11/10/2017 CLINICAL DATA:  83 year old female code stroke. Dizziness, headache, left side weakness. EXAM: CT ANGIOGRAPHY HEAD AND NECK TECHNIQUE: Multidetector CT imaging of the head and neck was performed using the standard protocol during bolus administration of intravenous contrast. Multiplanar CT image reconstructions and MIPs were obtained to evaluate the vascular anatomy. Carotid stenosis measurements (when applicable) are obtained utilizing NASCET criteria, using the distal internal carotid diameter as the denominator. CONTRAST:  50mL ISOVUE-370 IOPAMIDOL (ISOVUE-370) INJECTION 76%, <See Chart> ISOVUE-370 IOPAMIDOL (ISOVUE-370) INJECTION 76% COMPARISON:  Head CT without contrast 1050 hours today. Brain MRI 03/04/2015. Chest CTA 03/09/2016. FINDINGS: CTA NECK Skeleton: Widespread cervical spine degeneration. Advanced  left sternoclavicular joint degeneration. No acute osseous abnormality identified. Upper chest: Mosaic attenuation in both upper lungs. Superimposed reticulonodular apical interstitial opacity, worse on the right (series 5, image 28). This is new since 2018. Visible superior mediastinal lymph nodes appear stable. Other neck: Multiple hypodense thyroid nodules, likely benign. Negative for neck mass or lymphadenopathy. Aortic arch: 3 vessel arch configuration. Fusiform enlargement of the arch up to 31 millimeters diameter. Visible ascending aorta is 34 millimeters diameter. Proximal descending aorta appears to slowly taper. Mostly soft arch atherosclerotic plaque without great vessel origin stenosis. Right carotid system: Mildly tortuous right CCA. Minimal CCA and right carotid bifurcation plaque with no stenosis. Tortuous cervical right ICA, and the vessel has a beaded appearance along with fusiform aneurysmal enlargement, 7-9 millimeters diameter at the C1 level (series 8 image 28). No stenosis. Left carotid system: Minimal left CCA and left carotid bifurcation atherosclerosis without stenosis. Tortuous left ICA distal to the bulb with a beaded appearance to the skull base (series 8, images 29 and 31). No stenosis. Vertebral arteries: No proximal right subclavian artery or right vertebral artery origin stenosis. Tortuous right V1 segment with a mildly kinked appearance. Mildly beaded appearance of the right V3 segment but no stenosis to the skull base. No proximal left subclavian artery or left vertebral artery stenosis with minimal plaque. Patent left vertebral artery to the skull base. Mildly to moderately beaded V3 segment appearance noted. There is a small 2-3 millimeter saccular lesion directed posteriorly from the vessel as it crosses the dura on series 6, image 240. CTA HEAD Posterior circulation: Mildly beaded appearance of the bilateral V4 vertebral artery segments. The left is mildly dominant. Both PICA  origins are patent. No distal vertebral stenosis. Patent vertebrobasilar junction and basilar artery without stenosis. Mild basilar tortuosity. SCA and PCA origins are normal. Posterior communicating arteries are diminutive or absent. Both P1 segments are tortuous but without stenosis. There are tandem severe stenoses of the left PCA P2 and P3 segments seen on series 9, image 71. There is preserved distal enhancement. On the  right there is moderate P2 segment stenosis with attenuated distal right PCA enhancement. Anterior circulation: Both ICA siphons are patent. On the left there is mild calcified plaque without stenosis. On the right there is mild calcified plaque without stenosis. Normal ophthalmic artery origins. Patent carotid termini. The right A1 segment is dominant, the left is diminutive. ACA origins, anterior communicating artery, and bilateral ACA branches are within normal limits. Left MCA M1 segment, bifurcation, and left MCA branches are within normal limits. Right MCA M1 segment is patent without stenosis. Right MCA bifurcation is patent. No right MCA branch occlusion or high-grade stenosis is identified. Venous sinuses: Patent. Anatomic variants: Mildly dominant left vertebral artery. Review of the MIP images confirms the above findings IMPRESSION: 1. Negative for large vessel occlusion, but there are moderate to severe bilateral PCA P2/P3 segment stenoses worse on the left. 2. Positive also for bilateral ICA and distal vertebral artery Fibromuscular Dysplasia (FMD). Distal cervical Right ICA involvement including fusiform aneurysmal enlargement of the vessel 7-9 mm diameter. 3. Positive also for a small 2-3 mm aneurysm versus infundibulum of the distal Left Vertebral Artery directed posteriorly as it crosses the dura. See series 6, image 240. 4. Abnormal right upper lobe reticulonodular pulmonary opacity is nonspecific. Recommend dedicated Chest CT. 5. Ectatic Ascending Aorta and arch, 31-34 mm  diameter. Recommend annual imaging followup by CTA or MRA. This recommendation follows 2010 ACCF/AHA/AATS/ACR/ASA/SCA/SCAI/SIR/STS/SVM Guidelines for the Diagnosis and Management of Patients with Thoracic Aortic Disease. Circulation.2010; 121: Z610-R604 Electronically Signed   By: Odessa Fleming M.D.   On: 11/10/2017 11:30   Ct Head Code Stroke Wo Contrast  Result Date: 11/10/2017 CLINICAL DATA:  Code stroke. 83 year old female with left side weakness. EXAM: CT HEAD WITHOUT CONTRAST TECHNIQUE: Contiguous axial images were obtained from the base of the skull through the vertex without intravenous contrast. COMPARISON:  Head CT 02/23/2017.  Brain MRI 03/04/2015. FINDINGS: Brain: Cerebral volume is stable and normal for age. No midline shift, mass effect, or evidence of intracranial mass lesion. No ventriculomegaly. No acute intracranial hemorrhage identified. Stable and normal for age gray-white matter differentiation throughout the brain. No cortically based acute infarct identified. No cortical encephalomalacia identified. Vascular: Calcified atherosclerosis at the skull base. The major intracranial vascular structures appear stable. No suspicious intracranial vascular hyperdensity. Skull: Stable and negative. Sinuses/Orbits: Stable and generally well pneumatized paranasal sinuses. Chronic left sphenoid mucosal thickening. Tympanic cavities and mastoids are clear. Other: Stable and negative orbit and scalp soft tissues. ASPECTS (Alberta Stroke Program Early CT Score) - Ganglionic level infarction (caudate, lentiform nuclei, internal capsule, insula, M1-M3 cortex): 7 - Supraganglionic infarction (M4-M6 cortex): 3 Total score (0-10 with 10 being normal): 10 IMPRESSION: 1. Stable and normal for age noncontrast CT appearance of the brain. 2. ASPECTS is 10. 3. These results were communicated to Dr. Amada Jupiter at 11:14 amon 10/5/2019by text page via the [facility] messaging system. Electronically Signed   By: Odessa Fleming M.D.   On:  11/10/2017 11:15    My personal review of EKG: Rhythm NSR, Rate  78 /min, QTc 465 ,   Assessment & Plan:    Active Problems:   Hyperlipidemia   Essential hypertension   Coronary atherosclerosis   PAF (paroxysmal atrial fibrillation) (HCC)   CKD (chronic kidney disease) stage 3, GFR 30-59 ml/min (HCC)   Acute CVA (cerebrovascular accident) (HCC)   Hypertensive emergency  Left Side weakness with left facial droop -This is most likely related to ischemic event, she already had CTA head and  neck in ED which was negative for large vessel occlusion, but significant for moderate to severe bilateral PCA P2- P3 segment stenosis worse on the left, she is already on aspirin at home. -to Be admitted for CVA work-up, she will be admitted to telemetry unit, already received full dose aspirin, will check A1c, lipid panel, will allow for permissive hypertension, I am MRI brain, and 2D echo. - she is ready known to have A. fib which is managed with aspirin. -PT/OT/SLP consult  Hypertensive emergency -She missed her antihypertensive medication for last 3 days. -Systolic blood pressure around 260 while in ED and she presents with active CVA, as well there is an evidence of small aneurysm in her CTA head. -Started on nicardipine drip for target systolic blood pressure around 220, and diastolic around 120. -She is on PRN hydralazine as well, -for Now continue to hold  Metoprolol -she is out of her meds for last 3 days  History of A. Fib -Patient reports she is on flecainide and metoprolol for A. fib, she has been on warfarin around 8 years ago, but she has been transitioned to aspirin for last 8 years, likely will need to start anticoagulation, will defer timing to neurology  Hyperlipidemia -Check lipid panel, she is not on statins, has Lipitor allergy  CKD -Renal function around baseline   Right upper lobe opacity -CT chest for further evaluation when she is more stable, and blood pressure well  controlled, hopefully will be able to obtain in 1 to 2 days .   DVT Prophylaxis Heparin -  SCDs   AM Labs Ordered, also please review Full Orders  Family Communication: Admission, patients condition and plan of care including tests being ordered have been discussed with the patient and husband and daugther who indicate understanding and agree with the plan and Code Status.  Code Status Full  Likely DC to  Home  Condition GUARDED    Consults called: neurology  Admission status: inpatient  Time spent in minutes : 65 minutes   Huey Bienenstock M.D on 11/10/2017 at 1:40 PM  Between 7am to 7pm - Pager - [phone]. After 7pm go to [email or web address] - password [facility]  [facility] - Office  [phone]

## 2017-11-10 NOTE — Code Documentation (Signed)
Patient was last known well at midnight.  When she awoke about 5am she noticed that she felt weak then went back to bed.  When she awoke again about 9am the weakness was still present and she called EMS.  EMS called code stroke en route at 1015.  Patient arrived at 1034, stat head CT and labs done.  Dr Amada Jupiter at bedside to assess patient. CTA head and neck done.  Plan admit for stroke work up and rehab.  Hand off with [facility] ED RN.

## 2017-11-10 NOTE — ED Notes (Signed)
MRI called to advise in order to proceed with MRI they will need patients remote to spinal cord stimulator and the card for spinal cord stimulator showing the model #.

## 2017-11-10 NOTE — ED Notes (Signed)
Cheryl [name] @ MRI talked with [employer] regarding spinal stimulator and was advised that the wires and generator is not safe to undergo a MRI.

## 2017-11-10 NOTE — ED Notes (Signed)
Advised Dr. Jacqulyn Bath and Valentina Lucks, NP of MRI update.

## 2017-11-10 NOTE — Progress Notes (Signed)
RN paged Linton Flemings, NP to make aware that Dr. Laurence Slate wants BP in 170's-180's, awaiting call back.  [name], RN

## 2017-11-10 NOTE — Progress Notes (Signed)
RN paged Linton Flemings, NP to let her know patient's BP is 170/74 manually following Labetalol IV that was given at 2243.  RN made her aware IV Robaxin has not been given, but patient no longer having muscle spasms. RN inquired if IV Robaxin should be given, awaiting response.  [name], Charity fundraiser.

## 2017-11-10 NOTE — ED Provider Notes (Signed)
Emergency Department Provider Note   I have reviewed the triage vital signs and the nursing notes.   HISTORY  Chief Complaint Code Stroke   HPI Cheryl [name] is a 83 y.o. female with PMH of CAD, GERD, HLD, HTN, and PAF since to the emergency department for evaluation of left arm and leg weakness.  Patient awoke with symptoms this morning.  She was activated as a code stroke by EMS in the field which was canceled shortly after arrival.  The patient has left face droop along with left arm and leg weakness.  No prior history of stroke.  Patient states that she felt well prior to going to sleep around midnight. No CP or SOB. No vision changes. No difficulty with speech or swallowing.   Past Medical History:  Diagnosis Date  . Arthritis   . CAD (coronary artery disease)    Mild per remote cath in 2004; normal nuclear in 2012  . Chronic kidney disease    stage3 kidney disease   . Complication of anesthesia    slow to wake up  . Diastolic dysfunction    Grade I, per echo in 2012  . Dysrhythmia   . GERD (gastroesophageal reflux disease)    prevacid  . H/O hiatal hernia   . Headache(784.0)    sinus  . High risk medication use    Flecainide therapy  . History of heartburn   . Hyperlipidemia   . Hypertension   . LVH (left ventricular hypertrophy)    Per echo in July 2012  . Normal nuclear stress test July 2012  . Obesity   . PAF (paroxysmal atrial fibrillation) (HCC)   . Pneumonia    20 yrs ago  . Syncope July 2012    Patient Active Problem List   Diagnosis Date Noted  . S/P insertion of spinal cord stimulator 10/11/2017  . Chronic pain syndrome 10/11/2017  . Post laminectomy syndrome 10/11/2017  . Radiculopathy, lumbar region 10/11/2017  . Headache 02/23/2017  . Nausea & vomiting 02/23/2017  . Hyponatremia 02/23/2017  . Hypertensive urgency 02/23/2017  . Lumbar herniated disc 02/15/2015  . CKD (chronic kidney disease) stage 3, GFR 30-59 ml/min (HCC) 06/22/2014  .  Spondylolisthesis of lumbar region 08/07/2013  . Hyperlipidemia 06/13/2008  . PAF (paroxysmal atrial fibrillation) (HCC) 06/13/2008  . Essential hypertension 11/05/2006  . Coronary atherosclerosis 11/05/2006    Past Surgical History:  Procedure Laterality Date  . APPENDECTOMY  1952  . BACK SURGERY  1980, 2010   x2  . CARDIAC CATHETERIZATION  07/28/2002   EF GREATER THAN 55%; MILD CAD  . COLONOSCOPY    . DILATION AND CURETTAGE OF UTERUS    . LUMBAR LAMINECTOMY/DECOMPRESSION MICRODISCECTOMY Right 02/15/2015   Procedure: Right Thoracic twelve-Lumbar one  Microdiskectomy;  Surgeon: Tressie Stalker, MD;  Location: [facility];  Service: Neurosurgery;  Laterality: Right;  Thoracic/Lumbar  . TONSILLECTOMY     age 78 yrs  . TUBAL LIGATION     Allergies Amlodipine besylate; Codeine phosphate; Lipitor [atorvastatin calcium]; and Sulfonamide derivatives  Family History  Problem Relation Age of Onset  . Lung cancer Father     Social History Social History   Tobacco Use  . Smoking status: Never Smoker  . Smokeless tobacco: Never Used  Substance Use Topics  . Alcohol use: No  . Drug use: No    Review of Systems  Constitutional: No fever/chills Eyes: No visual changes. ENT: No sore throat. Cardiovascular: Denies chest pain. Respiratory: Denies shortness  of breath. Gastrointestinal: No abdominal pain.  No nausea, no vomiting.  No diarrhea.  No constipation. Genitourinary: Negative for dysuria. Musculoskeletal: Negative for back pain. Skin: Negative for rash. Neurological: Negative for headaches. Positive left face, arm, and left weakness.   10-point ROS otherwise negative.  ____________________________________________   PHYSICAL EXAM:  VITAL SIGNS: ED Triage Vitals  Enc Vitals Group     BP 11/10/17 1113 (S) (!) 263/150     Pulse Rate 11/10/17 1113 81     Resp 11/10/17 1113 17     Temp 11/10/17 1113 97.7 F (36.5 C)     Temp Source 11/10/17 1113 Oral     SpO2  11/10/17 1113 94 %     Pain Score 11/10/17 1104 0   Constitutional: Alert and oriented. Well appearing and in no acute distress. Eyes: Conjunctivae are normal. PERRL. EOMI. Head: Atraumatic. Nose: No congestion/rhinnorhea. Mouth/Throat: Mucous membranes are moist.  Oropharynx non-erythematous. Neck: No stridor.  Cardiovascular: Normal rate, regular rhythm. Good peripheral circulation. Grossly normal heart sounds.   Respiratory: Normal respiratory effort.  No retractions. Lungs CTAB. Gastrointestinal: Soft and nontender. No distention.  Musculoskeletal: No lower extremity tenderness nor edema. No gross deformities of extremities. Neurologic:  Normal speech and language. Left face droop with 4+/5 LUE and LLE weakness. Positive pronator drift on the left.  Skin:  Skin is warm, dry and intact. No rash noted.  ____________________________________________   LABS (all labs ordered are listed, but only abnormal results are displayed)  Labs Reviewed  COMPREHENSIVE METABOLIC PANEL - Abnormal; Notable for the following components:      Result Value   Sodium 134 (*)    Glucose, Bld 102 (*)    Creatinine, Ser 1.19 (*)    Total Protein 6.1 (*)    GFR calc non Af Amer 42 (*)    GFR calc Af Amer 48 (*)    All other components within normal limits  I-STAT CHEM 8, ED - Abnormal; Notable for the following components:   BUN 24 (*)    Creatinine, Ser 1.30 (*)    All other components within normal limits  PROTIME-INR  APTT  CBC  DIFFERENTIAL  I-STAT TROPONIN, ED  CBG MONITORING, ED   ____________________________________________  EKG   EKG Interpretation  Date/Time:  Saturday November 10 2017 11:15:39 EDT Ventricular Rate:  78 PR Interval:    QRS Duration: 97 QT Interval:  408 QTC Calculation: 465 R Axis:   -78 Text Interpretation:  Sinus rhythm Left anterior fascicular block LVH with secondary repolarization abnormality Anterior Q waves, possibly due to LVH ST changes similar to 2017  trcing. No STEMI.  Confirmed by Alona Bene [phone]) on 11/10/2017 11:18:22 AM       ____________________________________________  RADIOLOGY  Ct Angio Head W Or Wo Contrast  Result Date: 11/10/2017 CLINICAL DATA:  83 year old female code stroke. Dizziness, headache, left side weakness. EXAM: CT ANGIOGRAPHY HEAD AND NECK TECHNIQUE: Multidetector CT imaging of the head and neck was performed using the standard protocol during bolus administration of intravenous contrast. Multiplanar CT image reconstructions and MIPs were obtained to evaluate the vascular anatomy. Carotid stenosis measurements (when applicable) are obtained utilizing NASCET criteria, using the distal internal carotid diameter as the denominator. CONTRAST:  50mL ISOVUE-370 IOPAMIDOL (ISOVUE-370) INJECTION 76%, <See Chart> ISOVUE-370 IOPAMIDOL (ISOVUE-370) INJECTION 76% COMPARISON:  Head CT without contrast 1050 hours today. Brain MRI 03/04/2015. Chest CTA 03/09/2016. FINDINGS: CTA NECK Skeleton: Widespread cervical spine degeneration. Advanced left sternoclavicular joint degeneration. No acute osseous  abnormality identified. Upper chest: Mosaic attenuation in both upper lungs. Superimposed reticulonodular apical interstitial opacity, worse on the right (series 5, image 28). This is new since 2018. Visible superior mediastinal lymph nodes appear stable. Other neck: Multiple hypodense thyroid nodules, likely benign. Negative for neck mass or lymphadenopathy. Aortic arch: 3 vessel arch configuration. Fusiform enlargement of the arch up to 31 millimeters diameter. Visible ascending aorta is 34 millimeters diameter. Proximal descending aorta appears to slowly taper. Mostly soft arch atherosclerotic plaque without great vessel origin stenosis. Right carotid system: Mildly tortuous right CCA. Minimal CCA and right carotid bifurcation plaque with no stenosis. Tortuous cervical right ICA, and the vessel has a beaded appearance along with fusiform  aneurysmal enlargement, 7-9 millimeters diameter at the C1 level (series 8 image 28). No stenosis. Left carotid system: Minimal left CCA and left carotid bifurcation atherosclerosis without stenosis. Tortuous left ICA distal to the bulb with a beaded appearance to the skull base (series 8, images 29 and 31). No stenosis. Vertebral arteries: No proximal right subclavian artery or right vertebral artery origin stenosis. Tortuous right V1 segment with a mildly kinked appearance. Mildly beaded appearance of the right V3 segment but no stenosis to the skull base. No proximal left subclavian artery or left vertebral artery stenosis with minimal plaque. Patent left vertebral artery to the skull base. Mildly to moderately beaded V3 segment appearance noted. There is a small 2-3 millimeter saccular lesion directed posteriorly from the vessel as it crosses the dura on series 6, image 240. CTA HEAD Posterior circulation: Mildly beaded appearance of the bilateral V4 vertebral artery segments. The left is mildly dominant. Both PICA origins are patent. No distal vertebral stenosis. Patent vertebrobasilar junction and basilar artery without stenosis. Mild basilar tortuosity. SCA and PCA origins are normal. Posterior communicating arteries are diminutive or absent. Both P1 segments are tortuous but without stenosis. There are tandem severe stenoses of the left PCA P2 and P3 segments seen on series 9, image 71. There is preserved distal enhancement. On the right there is moderate P2 segment stenosis with attenuated distal right PCA enhancement. Anterior circulation: Both ICA siphons are patent. On the left there is mild calcified plaque without stenosis. On the right there is mild calcified plaque without stenosis. Normal ophthalmic artery origins. Patent carotid termini. The right A1 segment is dominant, the left is diminutive. ACA origins, anterior communicating artery, and bilateral ACA branches are within normal limits. Left MCA  M1 segment, bifurcation, and left MCA branches are within normal limits. Right MCA M1 segment is patent without stenosis. Right MCA bifurcation is patent. No right MCA branch occlusion or high-grade stenosis is identified. Venous sinuses: Patent. Anatomic variants: Mildly dominant left vertebral artery. Review of the MIP images confirms the above findings IMPRESSION: 1. Negative for large vessel occlusion, but there are moderate to severe bilateral PCA P2/P3 segment stenoses worse on the left. 2. Positive also for bilateral ICA and distal vertebral artery Fibromuscular Dysplasia (FMD). Distal cervical Right ICA involvement including fusiform aneurysmal enlargement of the vessel 7-9 mm diameter. 3. Positive also for a small 2-3 mm aneurysm versus infundibulum of the distal Left Vertebral Artery directed posteriorly as it crosses the dura. See series 6, image 240. 4. Abnormal right upper lobe reticulonodular pulmonary opacity is nonspecific. Recommend dedicated Chest CT. 5. Ectatic Ascending Aorta and arch, 31-34 mm diameter. Recommend annual imaging followup by CTA or MRA. This recommendation follows 2010 ACCF/AHA/AATS/ACR/ASA/SCA/SCAI/SIR/STS/SVM Guidelines for the Diagnosis and Management of Patients with Thoracic Aortic Disease.  Circulation.2010; 121: [id number] Electronically Signed   By: Odessa Fleming M.D.   On: 11/10/2017 11:30   Ct Angio Neck W Or Wo Contrast  Result Date: 11/10/2017 CLINICAL DATA:  83 year old female code stroke. Dizziness, headache, left side weakness. EXAM: CT ANGIOGRAPHY HEAD AND NECK TECHNIQUE: Multidetector CT imaging of the head and neck was performed using the standard protocol during bolus administration of intravenous contrast. Multiplanar CT image reconstructions and MIPs were obtained to evaluate the vascular anatomy. Carotid stenosis measurements (when applicable) are obtained utilizing NASCET criteria, using the distal internal carotid diameter as the denominator. CONTRAST:  50mL  ISOVUE-370 IOPAMIDOL (ISOVUE-370) INJECTION 76%, <See Chart> ISOVUE-370 IOPAMIDOL (ISOVUE-370) INJECTION 76% COMPARISON:  Head CT without contrast 1050 hours today. Brain MRI 03/04/2015. Chest CTA 03/09/2016. FINDINGS: CTA NECK Skeleton: Widespread cervical spine degeneration. Advanced left sternoclavicular joint degeneration. No acute osseous abnormality identified. Upper chest: Mosaic attenuation in both upper lungs. Superimposed reticulonodular apical interstitial opacity, worse on the right (series 5, image 28). This is new since 2018. Visible superior mediastinal lymph nodes appear stable. Other neck: Multiple hypodense thyroid nodules, likely benign. Negative for neck mass or lymphadenopathy. Aortic arch: 3 vessel arch configuration. Fusiform enlargement of the arch up to 31 millimeters diameter. Visible ascending aorta is 34 millimeters diameter. Proximal descending aorta appears to slowly taper. Mostly soft arch atherosclerotic plaque without great vessel origin stenosis. Right carotid system: Mildly tortuous right CCA. Minimal CCA and right carotid bifurcation plaque with no stenosis. Tortuous cervical right ICA, and the vessel has a beaded appearance along with fusiform aneurysmal enlargement, 7-9 millimeters diameter at the C1 level (series 8 image 28). No stenosis. Left carotid system: Minimal left CCA and left carotid bifurcation atherosclerosis without stenosis. Tortuous left ICA distal to the bulb with a beaded appearance to the skull base (series 8, images 29 and 31). No stenosis. Vertebral arteries: No proximal right subclavian artery or right vertebral artery origin stenosis. Tortuous right V1 segment with a mildly kinked appearance. Mildly beaded appearance of the right V3 segment but no stenosis to the skull base. No proximal left subclavian artery or left vertebral artery stenosis with minimal plaque. Patent left vertebral artery to the skull base. Mildly to moderately beaded V3 segment  appearance noted. There is a small 2-3 millimeter saccular lesion directed posteriorly from the vessel as it crosses the dura on series 6, image 240. CTA HEAD Posterior circulation: Mildly beaded appearance of the bilateral V4 vertebral artery segments. The left is mildly dominant. Both PICA origins are patent. No distal vertebral stenosis. Patent vertebrobasilar junction and basilar artery without stenosis. Mild basilar tortuosity. SCA and PCA origins are normal. Posterior communicating arteries are diminutive or absent. Both P1 segments are tortuous but without stenosis. There are tandem severe stenoses of the left PCA P2 and P3 segments seen on series 9, image 71. There is preserved distal enhancement. On the right there is moderate P2 segment stenosis with attenuated distal right PCA enhancement. Anterior circulation: Both ICA siphons are patent. On the left there is mild calcified plaque without stenosis. On the right there is mild calcified plaque without stenosis. Normal ophthalmic artery origins. Patent carotid termini. The right A1 segment is dominant, the left is diminutive. ACA origins, anterior communicating artery, and bilateral ACA branches are within normal limits. Left MCA M1 segment, bifurcation, and left MCA branches are within normal limits. Right MCA M1 segment is patent without stenosis. Right MCA bifurcation is patent. No right MCA branch occlusion or high-grade stenosis is  identified. Venous sinuses: Patent. Anatomic variants: Mildly dominant left vertebral artery. Review of the MIP images confirms the above findings IMPRESSION: 1. Negative for large vessel occlusion, but there are moderate to severe bilateral PCA P2/P3 segment stenoses worse on the left. 2. Positive also for bilateral ICA and distal vertebral artery Fibromuscular Dysplasia (FMD). Distal cervical Right ICA involvement including fusiform aneurysmal enlargement of the vessel 7-9 mm diameter. 3. Positive also for a small 2-3 mm  aneurysm versus infundibulum of the distal Left Vertebral Artery directed posteriorly as it crosses the dura. See series 6, image 240. 4. Abnormal right upper lobe reticulonodular pulmonary opacity is nonspecific. Recommend dedicated Chest CT. 5. Ectatic Ascending Aorta and arch, 31-34 mm diameter. Recommend annual imaging followup by CTA or MRA. This recommendation follows 2010 ACCF/AHA/AATS/ACR/ASA/SCA/SCAI/SIR/STS/SVM Guidelines for the Diagnosis and Management of Patients with Thoracic Aortic Disease. Circulation.2010; 121: [id number] Electronically Signed   By: Odessa Fleming M.D.   On: 11/10/2017 11:30   Ct Head Code Stroke Wo Contrast  Result Date: 11/10/2017 CLINICAL DATA:  Code stroke. 83 year old female with left side weakness. EXAM: CT HEAD WITHOUT CONTRAST TECHNIQUE: Contiguous axial images were obtained from the base of the skull through the vertex without intravenous contrast. COMPARISON:  Head CT 02/23/2017.  Brain MRI 03/04/2015. FINDINGS: Brain: Cerebral volume is stable and normal for age. No midline shift, mass effect, or evidence of intracranial mass lesion. No ventriculomegaly. No acute intracranial hemorrhage identified. Stable and normal for age gray-white matter differentiation throughout the brain. No cortically based acute infarct identified. No cortical encephalomalacia identified. Vascular: Calcified atherosclerosis at the skull base. The major intracranial vascular structures appear stable. No suspicious intracranial vascular hyperdensity. Skull: Stable and negative. Sinuses/Orbits: Stable and generally well pneumatized paranasal sinuses. Chronic left sphenoid mucosal thickening. Tympanic cavities and mastoids are clear. Other: Stable and negative orbit and scalp soft tissues. ASPECTS (Alberta Stroke Program Early CT Score) - Ganglionic level infarction (caudate, lentiform nuclei, internal capsule, insula, M1-M3 cortex): 7 - Supraganglionic infarction (M4-M6 cortex): 3 Total score (0-10 with  10 being normal): 10 IMPRESSION: 1. Stable and normal for age noncontrast CT appearance of the brain. 2. ASPECTS is 10. 3. These results were communicated to Dr. Amada Jupiter at 11:14 amon 10/5/2019by text page via the [facility] messaging system. Electronically Signed   By: Odessa Fleming M.D.   On: 11/10/2017 11:15    ____________________________________________   PROCEDURES  Procedure(s) performed:   Procedures  CRITICAL CARE Performed by: Maia Plan Total critical care time: 35 minutes Critical care time was exclusive of separately billable procedures and treating other patients. Critical care was necessary to treat or prevent imminent or life-threatening deterioration. Critical care was time spent personally by me on the following activities: development of treatment plan with patient and/or surrogate as well as nursing, discussions with consultants, evaluation of patient's response to treatment, examination of patient, obtaining history from patient or surrogate, ordering and performing treatments and interventions, ordering and review of laboratory studies, ordering and review of radiographic studies, pulse oximetry and re-evaluation of patient's condition.  Alona Bene, MD Emergency Medicine  ____________________________________________   INITIAL IMPRESSION / ASSESSMENT AND PLAN / ED COURSE  Pertinent labs & imaging results that were available during my care of the patient were reviewed by me and considered in my medical decision making (see chart for details).  Patient presents to the emergency department as a code stroke. She has left-sided deficits but is outside of the tPA window. No concern for airway compromise.  Spoke with Dr. Amada Jupiter guarding the patient's symptoms.  Agrees with plan for admission with patient's obvious left side deficits.  No acute bleeding on CT.  Patient's blood pressure is significantly elevated.  Plan for labetalol. Neurology would want meds give for  SBP > 220 but ok if lower than that.   Discussed patient's case with Hospitalist to request admission. Patient and family (if present) updated with plan. Care transferred to Hospitalist service.  I reviewed all nursing notes, vitals, pertinent old records, EKGs, labs, imaging (as available).  ____________________________________________  FINAL CLINICAL IMPRESSION(S) / ED DIAGNOSES  Final diagnoses:  Acute ischemic stroke (HCC)    MEDICATIONS GIVEN DURING THIS VISIT:  Medications  iopamidol (ISOVUE-370) 76 % injection (has no administration in time range)  iopamidol (ISOVUE-370) 76 % injection 50 mL (50 mLs Intravenous Contrast Given 11/10/17 1104)  labetalol (NORMODYNE,TRANDATE) injection 10 mg (10 mg Intravenous Given 11/10/17 1140)    Note:  This document was prepared using Dragon voice recognition software and may include unintentional dictation errors.  Alona Bene, MD Emergency Medicine    Long, Arlyss Repress, MD 11/10/17 1230

## 2017-11-10 NOTE — ED Triage Notes (Signed)
Pt arrives via ems, reports she went to bed last night around midnight, woke up at 5 this morning and felt "weak and wobbly." ems noted some left sided weakness and ataxia upon their arrival and code stroke was called in the field.

## 2017-11-11 DIAGNOSIS — I480 Paroxysmal atrial fibrillation: Secondary | ICD-10-CM

## 2017-11-11 LAB — BASIC METABOLIC PANEL
ANION GAP: 12 (ref 5–15)
BUN: 22 mg/dL (ref 8–23)
CALCIUM: 10 mg/dL (ref 8.9–10.3)
CO2: 23 mmol/L (ref 22–32)
Chloride: 100 mmol/L (ref 98–111)
Creatinine, Ser: 1.21 mg/dL — ABNORMAL HIGH (ref 0.44–1.00)
GFR calc non Af Amer: 41 mL/min — ABNORMAL LOW (ref >60)
GFR, EST AFRICAN AMERICAN: 47 mL/min — AB (ref >60)
GLUCOSE: 128 mg/dL — AB (ref 70–99)
Potassium: 3.9 mmol/L (ref 3.5–5.1)
Sodium: 135 mmol/L (ref 135–145)

## 2017-11-11 LAB — LIPID PANEL
CHOL/HDL RATIO: 4.3 ratio
CHOL/HDL RATIO: 4.4 ratio
CHOLESTEROL: 239 mg/dL — AB (ref 0–200)
Cholesterol: 241 mg/dL — ABNORMAL HIGH (ref 0–200)
HDL: 55 mg/dL (ref >40)
HDL: 55 mg/dL (ref >40)
LDL CALC: 174 mg/dL — AB (ref 0–99)
LDL Cholesterol: 173 mg/dL — ABNORMAL HIGH (ref 0–99)
TRIGLYCERIDES: 55 mg/dL (ref <150)
Triglycerides: 61 mg/dL (ref <150)
VLDL: 11 mg/dL (ref 0–40)
VLDL: 12 mg/dL (ref 0–40)

## 2017-11-11 LAB — HEMOGLOBIN A1C
Hgb A1c MFr Bld: 5.5 % (ref 4.8–5.6)
Mean Plasma Glucose: 111.15 mg/dL

## 2017-11-11 MED ORDER — LISINOPRIL 10 MG PO TABS: 10.0000 mg | ORAL_TABLET | Freq: Every day | ORAL | Status: DC

## 2017-11-11 MED ORDER — METOPROLOL TARTRATE 25 MG PO TABS
25.0000 mg | ORAL_TABLET | Freq: Two times a day (BID) | ORAL | Status: DC
  Administered 2017-11-11 – 2017-11-14 (×7): 25 mg via ORAL
  Filled 2017-11-11 (×7): qty 1

## 2017-11-11 MED ORDER — CYCLOBENZAPRINE HCL 5 MG PO TABS
5.0000 mg | ORAL_TABLET | Freq: Three times a day (TID) | ORAL | Status: DC | PRN
  Administered 2017-11-11 – 2017-11-14 (×7): 5 mg via ORAL
  Filled 2017-11-11 (×7): qty 1

## 2017-11-11 MED ORDER — TRAZODONE HCL 50 MG PO TABS
50.0000 mg | ORAL_TABLET | Freq: Once | ORAL | Status: AC
  Administered 2017-11-11: 50 mg via ORAL
  Filled 2017-11-11: qty 1

## 2017-11-11 MED ORDER — AMLODIPINE BESYLATE 10 MG PO TABS: 10.0000 mg | ORAL_TABLET | Freq: Every day | ORAL | Status: DC

## 2017-11-11 MED ORDER — SODIUM CHLORIDE 0.9 % IV SOLN: INTRAVENOUS | Status: DC

## 2017-11-11 MED ORDER — NITROGLYCERIN IN D5W 200-5 MCG/ML-% IV SOLN
0.0000 ug/min | INTRAVENOUS | Status: DC
  Filled 2017-11-11: qty 250

## 2017-11-11 MED ORDER — LABETALOL HCL 5 MG/ML IV SOLN
5.0000 mg | Freq: Once | INTRAVENOUS | Status: AC
  Administered 2017-11-11: 5 mg via INTRAVENOUS
  Filled 2017-11-11: qty 4

## 2017-11-11 MED ORDER — AMLODIPINE BESYLATE 5 MG PO TABS: 5.0000 mg | ORAL_TABLET | Freq: Once | ORAL | Status: DC

## 2017-11-11 MED ORDER — SODIUM CHLORIDE 0.9 % IV SOLN
INTRAVENOUS | Status: DC
  Administered 2017-11-11: 05:00:00 via INTRAVENOUS

## 2017-11-11 MED ORDER — HYDRALAZINE HCL 20 MG/ML IJ SOLN: 10.0000 mg | INTRAMUSCULAR | Status: DC | PRN

## 2017-11-11 MED ORDER — ALPRAZOLAM 0.25 MG PO TABS
0.2500 mg | ORAL_TABLET | Freq: Once | ORAL | Status: AC
  Administered 2017-11-12: 0.25 mg via ORAL
  Filled 2017-11-11: qty 1

## 2017-11-11 MED ORDER — ATORVASTATIN CALCIUM 20 MG PO TABS
20.0000 mg | ORAL_TABLET | Freq: Every day | ORAL | Status: DC
  Administered 2017-11-11: 20 mg via ORAL
  Filled 2017-11-11: qty 1

## 2017-11-11 MED ORDER — AMLODIPINE BESYLATE 5 MG PO TABS
5.0000 mg | ORAL_TABLET | Freq: Every day | ORAL | Status: DC
  Administered 2017-11-12: 5 mg via ORAL
  Filled 2017-11-11: qty 1

## 2017-11-11 MED ORDER — AMLODIPINE BESYLATE 5 MG PO TABS
5.0000 mg | ORAL_TABLET | Freq: Every day | ORAL | Status: DC
  Administered 2017-11-11: 5 mg via ORAL
  Filled 2017-11-11: qty 1

## 2017-11-11 NOTE — Progress Notes (Signed)
Patient's family called RN to room stating patient was not feeling well at 1930.  Upon entering room, appeared very anxious and stated she "feels strange". Patient c/o headache, nausea, and voiced she just didn't feel right.  Patient's BP had been elevated and day shift RN C. Burchette had given patient IV Hydralazine to lower BP at 1853 .  Patient vomited x1 and appeared to have increased facial droop and also began having left lower leg spasms.  RN called Puja, Rapid Response RN, and made her aware of patient's condition.  Puja requested a manual BP and EKG and states she will come see patient.  Manual BP at this time 180/80.  RN will continue to monitor patient and make MD aware of any changes.  [name], RN

## 2017-11-11 NOTE — Progress Notes (Signed)
RN paged Linton Flemings, NP to make her aware patient's BP is now 216/89 per monitor and 210/100 manually, awaiting response. [name], RN

## 2017-11-11 NOTE — Progress Notes (Signed)
PT Cancellation Note  Patient Details Name: Cheryl [name] MRN: [id number] DOB: 1934/06/27   Cancelled Treatment:    Reason Eval/Treat Not Completed: Patient not medically ready   Discussed pt with Rayfield Citizen, RN; she has been quite hypertensive most of the day, and tends to be nauseated and vomit with movement;   Will follow up for PT evaluation tomorrow,   Van Clines, PT  Acute Rehabilitation Services Pager [phone] Office [phone]      Levi Aland 11/11/2017, 1:53 PM

## 2017-11-11 NOTE — Progress Notes (Signed)
Received call back from Linton Flemings, NP regarding patient's elevated BP and left leg spasms.  NP states she will order Labetalol IV and transfer patient to ICU for NTG drip to have better control of BP.  RN inquired if it is ok to give Baclofen to patient at same time she is getting Labetalol and NP states it is fine to give Baclofen.  RN will continue to monitor patient and make NP aware of any changes.  [name], RN

## 2017-11-11 NOTE — Progress Notes (Signed)
RN paged Linton Flemings, NP to let her know patient's left leg spasms have just restarted.  RN also made her aware patient's BP is now 200/80 manually and 173/78 per monitor.  RN inquired if it ok to give ordered Baclofen since having trouble maintaining BP in desired range, awaiting response.  [name], RN

## 2017-11-11 NOTE — Progress Notes (Signed)
STROKE TEAM PROGRESS NOTE   HISTORY OF PRESENT ILLNESS (per record) Cheryl [name] is an 83 y.o. female PMH significant for HTN, HLD, paroxysmal a. Fib ( not anticoagulated), CAD, CKD who presented to [facility] ED as a code stroke for left facial droop and left sided weakness.   Per patient when she went to bed at midnight she was completely normal. When she woke up at 0500 to use the restroom she felt wobble and was having some weakness on the left side. She thought she was just sleepy so she went back to bed. Woke up at 0900 and the symptoms still persisted. She said she is wobbly when walking, weak in the knees and " just does not feel right". Patient lives with her husband who has alzheimer's. 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 Denies any SOB, CP, vision problems, numbness, tingling, HA, ETOH, smoking or drug abuse.No prior stroke history.  ED course:  BP: 144/100 BG: 106 creatinine:1.30, BUN: 24, GFR: 42 CT Head:  Normal for age. No acute findings   Date last known well: Date: 11/10/2017 Time last known well: Time: 00:00 tPA Given: No: outside of window Modified Rankin: Rankin Score=0 NIHSS:5   SUBJECTIVE (INTERVAL HISTORY) Complaining of a mild headache otherwise no distress    OBJECTIVE Vitals:   11/11/17 0736 11/11/17 0749 11/11/17 0751 11/11/17 0819  BP: (!) 182/100 (!) 167/132 (!) 194/106 (!) 189/85  Pulse:  74 74   Resp:  18 17 19   Temp:      TempSrc:      SpO2:  95% 90%   Weight:      Height:        CBC:  Recent Labs  Lab 11/10/17 1036 11/10/17 1040  WBC 4.6  --   NEUTROABS 2.4  --   HGB 12.7 12.9  HCT 37.6 38.0  MCV 94.2  --   PLT 221  --     Basic Metabolic Panel:  Recent Labs  Lab 11/10/17 1036 11/10/17 1040  NA 134* 135  K 3.8 3.8  CL 101 100  CO2 22  --   GLUCOSE 102* 98  BUN 22 24*  CREATININE 1.19* 1.30*  CALCIUM 9.6  --     Lipid Panel:     Component Value Date/Time   CHOL 239 (H) 11/11/2017 0528   TRIG 55  11/11/2017 0528   HDL 55 11/11/2017 0528   CHOLHDL 4.3 11/11/2017 0528   VLDL 11 11/11/2017 0528   LDLCALC 173 (H) 11/11/2017 0528   HgbA1c:  Lab Results  Component Value Date   HGBA1C 5.5 11/11/2017   Urine Drug Screen: No results found for: LABOPIA, COCAINSCRNUR, LABBENZ, AMPHETMU, THCU, LABBARB  Alcohol Level No results found for: ETH  IMAGING   Ct Angio Head W Or Wo Contrast Ct Angio Neck W Or Wo Contrast 11/10/2017 IMPRESSION:  1. Negative for large vessel occlusion, but there are moderate to severe bilateral PCA P2/P3 segment stenoses worse on the left.  2. Positive also for bilateral ICA and distal vertebral artery Fibromuscular Dysplasia (FMD). Distal cervical Right ICA involvement including fusiform aneurysmal enlargement of the vessel 7-9 mm diameter.  3. Positive also for a small 2-3 mm aneurysm versus infundibulum of the distal Left Vertebral Artery directed posteriorly as it crosses the dura. See series 6, image 240.  4. Abnormal right upper lobe reticulonodular pulmonary opacity is nonspecific.   Recommend dedicated Chest CT.  5. Ectatic Ascending Aorta and arch,  31-34 mm diameter.   Recommend annual imaging followup by CTA or MRA. This recommendation follows 2010 ACCF/AHA/AATS/ACR/ASA/SCA/SCAI/SIR/STS/SVM Guidelines for the Diagnosis and Management of Patients with Thoracic Aortic Disease. Circulation.2010; 121: e266-e369     Ct Head Wo Contrast 11/10/2017 IMPRESSION:  Acute nonhemorrhagic right basal ganglia infarct.    Ct Head Code Stroke Wo Contrast 11/10/2017 IMPRESSION:  1. Stable and normal for age noncontrast CT appearance of the brain.  2. ASPECTS is 10.     Transthoracic Echocardiogram - pending 00/00/00      PHYSICAL EXAM Blood pressure (!) 189/85, pulse 74, temperature 98.3 F (36.8 C), temperature source Oral, resp. rate 19, height 5\' 3"  (1.6 m), weight 65.5 kg, SpO2 90 %.  HEENT-  Normocephalic, no lesions, without obvious abnormality.   Normal external eye and conjunctiva.  Cardiovascular- S1-S2 audible, pulses palpable throughout   Lungs-no rhonchi or wheezing noted, no excessive working breathing.  Saturations within normal limits on RA Abdomen- BS present x 4 quadrants Extremities- Warm, dry and intact Musculoskeletal-no joint tenderness, deformity or swelling Skin-warm and dry, no hyperpigmentation, vitiligo, or suspicious lesions  Neurological Examination Mental Status: Alert, oriented, thought content appropriate.  Speech fluent without evidence of aphasia.  Mild dysarthria noted.  Able to follow  commands without difficulty. Cranial Nerves: II:  Visual fields grossly normal,  III,IV, VI: ptosis not present, extra-ocular motions intact bilaterally, pupils equal, round, reactive to light and accommodation V,VII: smile asymmetric, left facial droop, cool temp and facial light touch sensation normal bilaterally VIII: hearing normal bilaterally IX,X: uvula rises symmetrically XI: bilateral shoulder shrug XII: midline tongue extension Motor: Right :  Upper extremity   5/5              Left:     Upper extremity   4/5             Lower extremity   5/5                          Lower extremity   4/5 Tone and bulk:normal tone throughout; no atrophy noted Sensory: cool temp and light touch intact throughout, bilaterally Deep Tendon Reflexes: 2+ and symmetric biceps and patella Plantars: Right: downgoing                                Left: downgoing Cerebellar: normal finger-to-nose, normal rapid alternating movements and normal heel-to-shin test Gait: deferred      HOME MEDICATIONS:  Facility-Administered Medications Prior to Admission  Medication Dose Route Frequency Provider Last Rate Last Dose  . lidocaine (PF) (XYLOCAINE) 1 % injection 10 mL  10 mL Other Once Tyrell Antonio, MD       Medications Prior to Admission  Medication Sig Dispense Refill  . acetaminophen (TYLENOL) 500 MG tablet Take 500-1,000 mg  by mouth every 6 (six) hours as needed for mild pain.    Marland Kitchen aspirin EC 81 MG tablet Take 81 mg by mouth daily.    . Biotin 10 MG CAPS Take 10 mg by mouth daily.    . Cholecalciferol (VITAMIN D) 2000 UNITS tablet Take 2,000 Units by mouth daily.      Marland Kitchen docusate sodium (COLACE) 100 MG capsule Take 1 capsule (100 mg total) by mouth 2 (two) times daily. (Patient taking differently: Take 100 mg by mouth 2 (two) times daily as needed for mild constipation. ) 60 capsule 0  .  flecainide (TAMBOCOR) 50 MG tablet take 1 tablet by mouth once daily (Patient taking differently: Take 50 mg by mouth once. ) 90 tablet 3  . ibuprofen (ADVIL,MOTRIN) 200 MG tablet Take 400 mg by mouth every 6 (six) hours as needed for moderate pain.    Marland Kitchen loratadine (CLARITIN) 10 MG tablet Take 10 mg by mouth daily as needed for allergies.    . metoprolol succinate (TOPROL-XL) 50 MG 24 hr tablet Take 1 tablet (50 mg total) by mouth daily. 30 tablet 0  . traZODone (DESYREL) 100 MG tablet Take 100 mg by mouth at bedtime.      . cloNIDine (CATAPRES) 0.1 MG tablet Take 1 tablet (0.1 mg total) by mouth daily. (Patient not taking: Reported on 11/10/2017) 30 tablet 0  . diazepam (VALIUM) 5 MG tablet Take 1 by mouth 1 to 2 hours pre-procedure. May repeat if necessary. (Patient not taking: Reported on 11/10/2017) 2 tablet 0  . lisinopril (PRINIVIL,ZESTRIL) 20 MG tablet Take 1 tablet (20 mg total) by mouth daily. (Patient not taking: Reported on 11/10/2017) 30 tablet 0      HOSPITAL MEDICATIONS:  .  stroke: mapping our early stages of recovery book   Does not apply Once  . amLODipine  5 mg Oral Daily  . aspirin EC  81 mg Oral Daily  . aspirin  300 mg Rectal Daily   Or  . aspirin  325 mg Oral Daily  . atorvastatin  20 mg Oral q1800  . flecainide  50 mg Oral Daily  . heparin  5,000 Units Subcutaneous Q8H  . metoprolol tartrate  25 mg Oral BID    ALLERGIES Allergies  Allergen Reactions  . Amlodipine Besylate     REACTION: dizzy  .  Codeine Phosphate     headache  . Lipitor [Atorvastatin Calcium]     Muscle pain and tiredness  . Sulfonamide Derivatives     REACTION: unspecified    PAST MEDICAL HISTORY Past Medical History:  Diagnosis Date  . Arthritis   . CAD (coronary artery disease)    Mild per remote cath in 2004; normal nuclear in 2012  . Chronic kidney disease    stage3 kidney disease   . Complication of anesthesia    slow to wake up  . Diastolic dysfunction    Grade I, per echo in 2012  . Dysrhythmia   . GERD (gastroesophageal reflux disease)    prevacid  . H/O hiatal hernia   . Headache(784.0)    sinus  . High risk medication use    Flecainide therapy  . History of heartburn   . Hyperlipidemia   . Hypertension   . LVH (left ventricular hypertrophy)    Per echo in July 2012  . Normal nuclear stress test July 2012  . Obesity   . PAF (paroxysmal atrial fibrillation) (HCC)   . Pneumonia    20 yrs ago  . Syncope July 2012    SURGICAL HISTORY Past Surgical History:  Procedure Laterality Date  . APPENDECTOMY  1952  . BACK SURGERY  1980, 2010   x2  . CARDIAC CATHETERIZATION  07/28/2002   EF GREATER THAN 55%; MILD CAD  . COLONOSCOPY    . DILATION AND CURETTAGE OF UTERUS    . LUMBAR LAMINECTOMY/DECOMPRESSION MICRODISCECTOMY Right 02/15/2015   Procedure: Right Thoracic twelve-Lumbar one  Microdiskectomy;  Surgeon: Tressie Stalker, MD;  Location: [facility];  Service: Neurosurgery;  Laterality: Right;  Thoracic/Lumbar  . TONSILLECTOMY     age 57  yrs  . TUBAL LIGATION       ASSESSMENT/PLAN .   Stroke: right basal ganglia infarct - likely embolic from afib.   CT head - Acute nonhemorrhagic right basal ganglia infarct.   Cannot have MRI due to spinal stimulator   Carotid Doppler - CTA neck performed - carotid dopplers not indicated.  2D Echo - pending  LDL - 173  HgbA1c - 5.5  VTE prophylaxis - Baring Heparin  Diet  - Heart healthy with thin liquids.  aspirin 81 mg daily prior  to admission, now on aspirin 81 mg daily  Patient counseled to be compliant with her antithrombotic medications  Ongoing aggressive stroke risk factor management  Therapy recommendations:  pending  Disposition:  Pending  Hypertension  Has been running high . Gradually normalize BP . Long-term BP goal normotensive  Hyperlipidemia  Lipid lowering medication PTA:  none  LDL 173, goal < 70  Lipitor intolerance. If unable to tolerate statin consider IV Repatha or Praluent on an outpatient basis.    Other Stroke Risk Factors  Advanced age  Mild Coronary artery disease by cath 2004  PAF  Other Active Problems  Rt ICA fusiform aneursm.  Abnormal right upper lobe reticulonodular pulmonary opacity.  Chest CT. recommended.  Ectatic Ascending Aorta and arch, 31-34 mm diameter. Follow up recommended.  CKD  Plan  Per Dr William Hamburger - Gradually begin BP control in light of aneurysm.  Pain control should help elevated BP in addition to muscle relaxant for cramping / spasms.  Pt now agrees to anticoagulation for PAF. Consider Eliquis per pharmacy if no contraindication.  D/C ASA if Doac is started. If coumadin use ASA bridge.  Aneurysm Rt ICA - would consult Dr Corliss Skains or vascular surgery.      Hospital day # 1  Agree with plan above   To contact Stroke Continuity provider, please refer to [email or web address]. After hours, contact General Neurology

## 2017-11-11 NOTE — Progress Notes (Signed)
BP now 140/70 manually, patient resting comfortably with no complaints at this time.  RN will continue to monitor.  [name], RN

## 2017-11-11 NOTE — Progress Notes (Signed)
OT Cancellation Note  Patient Details Name: JOEANNA HOWDYSHELL MRN: [id number] DOB: 1934/04/05   Cancelled Treatment:    Reason Eval/Treat Not Completed: Medical issues which prohibited therapy. Pt with elevated bp (223/124 at last reading). Plan to reattempt tomorrow.   Raynald Kemp, OT Acute Rehabilitation Services Pager: [phone] Office: [phone]  11/11/2017, 11:14 AM

## 2017-11-11 NOTE — Progress Notes (Signed)
[name] ordered Baclofen PRN as needed for muscle spasms.  IV Robaxin not given.  [name], RN

## 2017-11-11 NOTE — Progress Notes (Addendum)
PROGRESS NOTE    Cheryl [name]  ZOX:[id number] DOB: [birth year] DOA: 11/10/2017 PCP: Laurann Montana, MD    Brief Narrative:  83 year old female who presented with left-sided weakness and facial droop.  She does have a significant past medical history for coronary artery disease, chronic kidney disease stage III, hypertension and paroxysmal atrial fibrillation.  Patient went to bed at her usual state of health, woke up around 5 AM to use the restroom and noted left-sided weakness.  At 9 AM her weakness was persistent.  Patient has been off her hypertensive agents for about 3 days prior to hospitalization.  Her initial blood pressure was 231/80, heart rate 61, temperature 97.7, respirate 19, oxygen saturation 96%.  She was awake and alert, positive for left facial droop, tongue deviation to the left, positive left-sided weakness, mainly left lower extremity, 3/4 out of 5, no right-sided weakness.  Her lungs are clear to auscultation, heart S1-S2 present, regular, abdomen soft nontender, no lower extremity edema.  Initial head CT negative for acute changes.  EKG with sinus rhythm, left axis deviation, lateral ST depressions, repolarization changes related to LVH.  Patient was admitted to the hospital with working diagnosis of acute neurologic deficit, CVA, complicated by hypertensive emergency.  Assessment & Plan:   Active Problems:   Hyperlipidemia   Essential hypertension   Coronary atherosclerosis   PAF (paroxysmal atrial fibrillation) (HCC)   CKD (chronic kidney disease) stage 3, GFR 30-59 ml/min (HCC)   Acute CVA (cerebrovascular accident) (HCC)   Hypertensive emergency   1. Acute CVA with left sided weakness. Patient continue to have weakness on the left, predominantly on the left lower extremity, positive left facial droop. Will continue neuro checks, aspiration precautions, physical and speech therapy evaluation. Blood pressure control systolic less than 210 mmHg but not lower than 160  mmHg during the first 24 to 48 hours. Continue aspirin for antiplatelet therapy. CT angiography with no large vessel occlusion. Follow on brain MRI and echocardiography. Start statin therapy as tolerated.     2. Uncontrolled HTN, hypertensive emergency. At home patient only taking metoprolol, last night had clonidine. Blood pressure systolic initially over 200, this am down to 180's. Will add amlodipine 5 mg daily and slowly discontinue clonidine and resume metoprolol.   3. Atrial fibrillation (paroxysmal). Patient is rate controlled, will plan to resume metoprolol per her home regimen. Elevated thrombotic risk with ChadsVasc score of 6, will need anticoagulation, likely DOAC. Continue flecainide.  4. Dyslipidemia.  Check lipid profile and start on statin therapy. Will dow low dose atorvastatin initially due to history of intolerance.   5. AKI on CKD stage 3. Renal function with serum cr at 1,30 from 1,19, will continue close monitoring. Continue blood pressure control. Avoid hypotension and nephrotoxic medications.   DVT prophylaxis: enoxaparin   Code Status:  full Family Communication: I spoke with patient's family at the bedside and all questions were addressed.  Disposition Plan/ discharge barriers:  Pending neurologic workup    Consultants:   Neurology   Procedures:     Antimicrobials:       Subjective: Patient continue to have headache and left lower extremity weakness, mild difficulty speaking, last night had nausea and vomiting. No chest pain or dyspnea.   Objective: Vitals:   11/11/17 0649 11/11/17 0729 11/11/17 0736 11/11/17 0749  BP: (!) 192/86 (!) 166/116 (!) 182/100 (!) 167/132  Pulse: 63 80  74  Resp: 17 (!) 22  18  Temp:      TempSrc:  SpO2: 93% 96%  95%  Weight:      Height:        Intake/Output Summary (Last 24 hours) at 11/11/2017 0809 Last data filed at 11/11/2017 0602 Gross per 24 hour  Intake 630.09 ml  Output 900 ml  Net -269.91 ml   Filed  Weights   11/10/17 1845  Weight: 65.5 kg    Examination:   General: deconditioned and ill looking appearing  Neurology: Awake and alert. 4/5 left lower extremity. 5/5 on the left upper and right side. Mild left facial droop. Episodic shaking of left leg.  E ENT: mild pallor, no icterus, oral mucosa moist Cardiovascular: No JVD. S1-S2 present, rhythmic, no gallops, rubs, or murmurs. No lower extremity edema. Pulmonary: decreased breath sounds bilaterally, poor inspiratory effort, no wheezing, rhonchi or rales. Gastrointestinal. Abdomen withno organomegaly, non tender, no rebound or guarding Skin. No rashes Musculoskeletal: no joint deformities     Data Reviewed: I have personally reviewed following labs and imaging studies  CBC: Recent Labs  Lab 11/10/17 1036 11/10/17 1040  WBC 4.6  --   NEUTROABS 2.4  --   HGB 12.7 12.9  HCT 37.6 38.0  MCV 94.2  --   PLT 221  --    Basic Metabolic Panel: Recent Labs  Lab 11/10/17 1036 11/10/17 1040  NA 134* 135  K 3.8 3.8  CL 101 100  CO2 22  --   GLUCOSE 102* 98  BUN 22 24*  CREATININE 1.19* 1.30*  CALCIUM 9.6  --    GFR: Estimated Creatinine Clearance: 30.9 mL/min (A) (by C-G formula based on SCr of 1.3 mg/dL (H)). Liver Function Tests: Recent Labs  Lab 11/10/17 1036  AST 22  ALT 13  ALKPHOS 60  BILITOT 0.6  PROT 6.1*  ALBUMIN 3.8   No results for input(s): LIPASE, AMYLASE in the last 168 hours. No results for input(s): AMMONIA in the last 168 hours. Coagulation Profile: Recent Labs  Lab 11/10/17 1036  INR 0.90   Cardiac Enzymes: No results for input(s): CKTOTAL, CKMB, CKMBINDEX, TROPONINI in the last 168 hours. BNP (last 3 results) No results for input(s): PROBNP in the last 8760 hours. HbA1C: Recent Labs    11/11/17 0528  HGBA1C 5.5   CBG: No results for input(s): GLUCAP in the last 168 hours. Lipid Profile: Recent Labs    11/11/17 0528  CHOL 239*  HDL 55  LDLCALC 173*  TRIG 55  CHOLHDL 4.3     Thyroid Function Tests: No results for input(s): TSH, T4TOTAL, FREET4, T3FREE, THYROIDAB in the last 72 hours. Anemia Panel: No results for input(s): VITAMINB12, FOLATE, FERRITIN, TIBC, IRON, RETICCTPCT in the last 72 hours.    Radiology Studies: I have reviewed all of the imaging during this hospital visit personally     Scheduled Meds: .  stroke: mapping our early stages of recovery book   Does not apply Once  . aspirin EC  81 mg Oral Daily  . aspirin  300 mg Rectal Daily   Or  . aspirin  325 mg Oral Daily  . cloNIDine  0.1 mg Oral BID  . flecainide  50 mg Oral Daily  . heparin  5,000 Units Subcutaneous Q8H  . lisinopril  10 mg Oral Daily   Continuous Infusions: . sodium chloride 75 mL/hr at 11/11/17 0433  . clevidipine    . methocarbamol (ROBAXIN) IV    . nitroGLYCERIN       LOS: 1 day  Mauricio Gerome Apley, MD [facility] Pager [phone]

## 2017-11-11 NOTE — Progress Notes (Signed)
Call received from Linton Flemings, NP who instructed RN to reassess patient's swallowing capacity and if able to swallow, give patient Clonidine that was held earlier in shift.  NP also instructed RN to notify NP if patient unable to swallow or if Clonidine PO does not work to lower BP.  RN will continue to monitor patient and make provider aware of any changes.  [name], RN

## 2017-11-11 NOTE — Progress Notes (Signed)
Received several phone calls throughout the night regarding pt labile BP. Unable to maintain SBP 170-180 with labetalol, clonidine or hydralazine. Pt continues with complaints of nausea, muscle spasms and headaches off and on. Will start pt on nitro gtt for better BP control and transfer to ICU d/t level of care r/t BP titration.    Audrea Muscat, NP [facility] 7p-7a [phone]

## 2017-11-11 NOTE — Progress Notes (Signed)
RN paged NP to make her aware patient c/o agitation and is requesting something to help with this.  Patient was given Trazodone @ 2147 with no relief, awaiting return call. [name], RN

## 2017-11-11 NOTE — Progress Notes (Signed)
RN paged Linton Flemings, NP that BP is now low at 96/44 and 120/68 manually.  NP returned call and instructed RN to restart IV fluids and monitor patient to see if BP goes up.  RN will continue to monitor patient and make NP aware of any changes.  [name], RN

## 2017-11-12 ENCOUNTER — Inpatient Hospital Stay (HOSPITAL_COMMUNITY): Payer: Medicare Other

## 2017-11-12 DIAGNOSIS — R918 Other nonspecific abnormal finding of lung field: Secondary | ICD-10-CM

## 2017-11-12 DIAGNOSIS — I5030 Unspecified diastolic (congestive) heart failure: Secondary | ICD-10-CM

## 2017-11-12 DIAGNOSIS — I2510 Atherosclerotic heart disease of native coronary artery without angina pectoris: Secondary | ICD-10-CM

## 2017-11-12 LAB — BASIC METABOLIC PANEL
Anion gap: 8 (ref 5–15)
BUN: 23 mg/dL (ref 8–23)
CHLORIDE: 101 mmol/L (ref 98–111)
CO2: 25 mmol/L (ref 22–32)
CREATININE: 1.09 mg/dL — AB (ref 0.44–1.00)
Calcium: 9.6 mg/dL (ref 8.9–10.3)
GFR calc non Af Amer: 46 mL/min — ABNORMAL LOW (ref >60)
GFR, EST AFRICAN AMERICAN: 54 mL/min — AB (ref >60)
Glucose, Bld: 92 mg/dL (ref 70–99)
POTASSIUM: 3.4 mmol/L — AB (ref 3.5–5.1)
Sodium: 134 mmol/L — ABNORMAL LOW (ref 135–145)

## 2017-11-12 LAB — ECHOCARDIOGRAM COMPLETE
Height: 63 in
WEIGHTICAEL: 2310.42 [oz_av]

## 2017-11-12 MED ORDER — APIXABAN 5 MG PO TABS
5.0000 mg | ORAL_TABLET | Freq: Two times a day (BID) | ORAL | Status: DC
  Administered 2017-11-12 – 2017-11-14 (×4): 5 mg via ORAL
  Filled 2017-11-12 (×4): qty 1

## 2017-11-12 MED ORDER — POTASSIUM CHLORIDE CRYS ER 20 MEQ PO TBCR
40.0000 meq | EXTENDED_RELEASE_TABLET | Freq: Once | ORAL | Status: AC
  Administered 2017-11-12: 40 meq via ORAL
  Filled 2017-11-12: qty 2

## 2017-11-12 MED ORDER — LISINOPRIL 5 MG PO TABS
5.0000 mg | ORAL_TABLET | Freq: Every day | ORAL | Status: DC
  Administered 2017-11-12 – 2017-11-14 (×3): 5 mg via ORAL
  Filled 2017-11-12 (×3): qty 1

## 2017-11-12 MED ORDER — TRAZODONE HCL 50 MG PO TABS
100.0000 mg | ORAL_TABLET | Freq: Every evening | ORAL | Status: DC | PRN
  Administered 2017-11-12 – 2017-11-13 (×2): 100 mg via ORAL
  Filled 2017-11-12 (×2): qty 2

## 2017-11-12 MED ORDER — PRAVASTATIN SODIUM 40 MG PO TABS
40.0000 mg | ORAL_TABLET | Freq: Every day | ORAL | Status: DC
  Administered 2017-11-12 – 2017-11-13 (×2): 40 mg via ORAL
  Filled 2017-11-12 (×2): qty 1

## 2017-11-12 MED ORDER — AMLODIPINE BESYLATE 10 MG PO TABS
10.0000 mg | ORAL_TABLET | Freq: Every day | ORAL | Status: DC
  Administered 2017-11-13 – 2017-11-14 (×2): 10 mg via ORAL
  Filled 2017-11-12 (×2): qty 1

## 2017-11-12 MED ORDER — STROKE: EARLY STAGES OF RECOVERY BOOK
Freq: Once | Status: DC
  Filled 2017-11-12: qty 1

## 2017-11-12 MED ORDER — ALPRAZOLAM 0.25 MG PO TABS
0.2500 mg | ORAL_TABLET | Freq: Every evening | ORAL | Status: DC | PRN
  Administered 2017-11-12: 0.25 mg via ORAL
  Filled 2017-11-12: qty 1

## 2017-11-12 NOTE — Evaluation (Signed)
Physical Therapy Evaluation Patient Details Name: Cheryl [name] MRN: [id number] DOB: 1935-02-04 Today's Date: 11/12/2017   History of Present Illness  Cheryl [name]  is a 83 y.o. female, with medical history significant forcoronary artery disease, chronic kidney disease stage III, hypertension, and paroxysmal atrial fibrillation, patient presents to ED with complaints of left-sided weakness and facial droop, reports she went to bed at midnight at her usual state of health, but she woke up around 5 AM to use the restroom, she felt wobbly with some weakness on the left side, so this is because she was sleepy, went back to bed, woke up around 9 AM with these weakness has persisted; Stat CT head revealed right basal ganglia infarct.    Clinical Impression  Pt admitted with above diagnosis. Pt currently with functional limitations due to the deficits listed below (see PT Problem List). PTA pt independent and active. 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 Eval significantly limited by dizziness and nausea upon sitting EOB. She required min assist to transition to/from sitting. BP sitting EOB was 189/108. It is the opinion of this therapist, that the patient will progress quickly with mobility once able to tolerate OOB. Pt will benefit from skilled PT to increase their independence and safety with mobility to allow discharge to the venue listed below.  Pt to be further assessed during next session.      Follow Up Recommendations Home health PT;Supervision/Assistance - 24 hour    Equipment Recommendations  Other (comment)(TBA)    Recommendations for Other Services       Precautions / Restrictions Precautions Precautions: Fall;Other (comment) Precaution Comments: watch BP      Mobility  Bed Mobility Overal bed mobility: Needs Assistance Bed Mobility: Supine to Sit;Sit to Supine     Supine to sit: Min assist;HOB elevated Sit to supine: Min assist;HOB elevated   General bed  mobility comments: +rail, guarded, increased time and effort  Transfers                 General transfer comment: unable to progress beyond EOB due to dizziness, nausea, and HA upon sitting. BP sitting 189/108. BP after return to supine 186/91  Ambulation/Gait             General Gait Details: unable to assess  Stairs            Wheelchair Mobility    Modified Rankin (Stroke Patients Only) Modified Rankin (Stroke Patients Only) Pre-Morbid Rankin Score: No symptoms Modified Rankin: Moderate disability     Balance                                             Pertinent Vitals/Pain Pain Assessment: Faces Faces Pain Scale: Hurts even more Pain Location: low back Pain Descriptors / Indicators: Sore;Grimacing;Guarding Pain Intervention(s): Monitored during session;Limited activity within patient's tolerance;Repositioned    Home Living Family/patient expects to be discharged to:: Private residence Living Arrangements: Spouse/significant other(Pt's husband has Alzheimer's. She is his caretaker. ) Available Help at Discharge: Family;Available 24 hours/day Type of Home: House Home Access: Stairs to enter Entrance Stairs-Rails: None Entrance Stairs-Number of Steps: 1 Home Layout: One level Home Equipment: Cane - single point;Shower seat      Prior Function Level of Independence: Independent         Comments: Drives. Active.     Hand Dominance  Dominant Hand: Left    Extremity/Trunk Assessment   Upper Extremity Assessment Upper Extremity Assessment: Defer to OT evaluation    Lower Extremity Assessment Lower Extremity Assessment: LLE deficits/detail LLE Deficits / Details: 4/5       Communication   Communication: No difficulties  Cognition Arousal/Alertness: Awake/alert Behavior During Therapy: WFL for tasks assessed/performed Overall Cognitive Status: Within Functional Limits for tasks assessed                                         General Comments      Exercises     Assessment/Plan    PT Assessment Patient needs continued PT services  PT Problem List Decreased strength;Decreased mobility;Decreased activity tolerance;Pain;Decreased balance       PT Treatment Interventions DME instruction;Therapeutic activities;Gait training;Therapeutic exercise;Patient/family education;Stair training;Balance training;Functional mobility training;Neuromuscular re-education    PT Goals (Current goals can be found in the Care Plan section)  Acute Rehab PT Goals Patient Stated Goal: home PT Goal Formulation: With patient Time For Goal Achievement: 11/26/17 Potential to Achieve Goals: Good    Frequency Min 4X/week   Barriers to discharge        Co-evaluation PT/OT/SLP Co-Evaluation/Treatment: Yes Reason for Co-Treatment: For patient/therapist safety PT goals addressed during session: Mobility/safety with mobility;Balance         AM-PAC PT "6 Clicks" Daily Activity  Outcome Measure Difficulty turning over in bed (including adjusting bedclothes, sheets and blankets)?: A Lot Difficulty moving from lying on back to sitting on the side of the bed? : A Lot Difficulty sitting down on and standing up from a chair with arms (e.g., wheelchair, bedside commode, etc,.)?: Unable Help needed moving to and from a bed to chair (including a wheelchair)?: A Little Help needed walking in hospital room?: A Little Help needed climbing 3-5 steps with a railing? : A Little 6 Click Score: 14    End of Session   Activity Tolerance: Treatment limited secondary to medical complications (Comment)(dizziness, nausea) Patient left: in bed;with call bell/phone within reach;with family/visitor present Nurse Communication: Mobility status PT Visit Diagnosis: Other abnormalities of gait and mobility (R26.89);Muscle weakness (generalized) (M62.81);Pain    Time: 1010-1028 PT Time Calculation (min) (ACUTE ONLY): 18  min   Charges:   PT Evaluation $PT Eval Moderate Complexity: 1 Mod          Aida Raider, PT  Office # [phone] Pager [phone]   Ilda Foil 11/12/2017, 10:42 AM

## 2017-11-12 NOTE — Progress Notes (Signed)
RN paged Linton Flemings, NP to make her aware patient's BP is 220/94 per monitor and 218/100 manually, awaiting response.  [name], RN

## 2017-11-12 NOTE — Progress Notes (Addendum)
ANTICOAGULATION CONSULT NOTE - Initial Consult  Pharmacy Consult for Eliquis start Indication: stroke  Allergies  Allergen Reactions  . Amlodipine Besylate     REACTION: dizzy  . Codeine Phosphate     headache  . Lipitor [Atorvastatin Calcium]     Muscle pain and tiredness  . Sulfonamide Derivatives     REACTION: unspecified    Patient Measurements: Height: 5\' 3"  (160 cm) Weight: 144 lb 6.4 oz (65.5 kg) IBW/kg (Calculated) : 52.4  Vital Signs: Temp: 98.2 F (36.8 C) (10/07 0935) Temp Source: Oral (10/07 0935) BP: 195/89 (10/07 0935) Pulse Rate: 72 (10/07 0935)  Labs: Recent Labs    11/10/17 1036 11/10/17 1040 11/11/17 0914 11/12/17 0522  HGB 12.7 12.9  --   --   HCT 37.6 38.0  --   --   PLT 221  --   --   --   APTT 25  --   --   --   LABPROT 12.0  --   --   --   INR 0.90  --   --   --   CREATININE 1.19* 1.30* 1.21* 1.09*    Estimated Creatinine Clearance: 36.8 mL/min (A) (by C-G formula based on SCr of 1.09 mg/dL (H)).   Medical History: Past Medical History:  Diagnosis Date  . Arthritis   . CAD (coronary artery disease)    Mild per remote cath in 2004; normal nuclear in 2012  . Chronic kidney disease    stage3 kidney disease   . Complication of anesthesia    slow to wake up  . Diastolic dysfunction    Grade I, per echo in 2012  . Dysrhythmia   . GERD (gastroesophageal reflux disease)    prevacid  . H/O hiatal hernia   . Headache(784.0)    sinus  . High risk medication use    Flecainide therapy  . History of heartburn   . Hyperlipidemia   . Hypertension   . LVH (left ventricular hypertrophy)    Per echo in July 2012  . Normal nuclear stress test July 2012  . Obesity   . PAF (paroxysmal atrial fibrillation) (HCC)   . Pneumonia    20 yrs ago  . Syncope July 2012   Assessment: Pt is post acute nonhemorrhagic right basal ganglia infarct stroke day 2 with history of PAF who has refused DOACs in the past but has now agreed to start. Pt was on  aspirin 81mg  QD PTA and was started on heparin SQ inpatient.  Goal of Therapy:  Prevent future stroke and thromboembolism, minimize risk of bleeding.  Plan:  Eliquis 5mg  BID  D/C aspirin 81mg  D/C heparin   Wess Botts 11/12/2017,2:20 PM   I discussed / reviewed the pharmacy note by Wess Botts, PharmD candidate and I agree with the student's findings and plans as documented. Lenora Gomes S. Merilynn Finland, PharmD, BCPS Clinical Staff Pharmacist

## 2017-11-12 NOTE — Progress Notes (Signed)
STROKE TEAM PROGRESS NOTE   SUBJECTIVE (INTERVAL HISTORY) Patient husband and son at bedside.  Patient has no complaints.  Still has left facial droop but left sided weakness much improved.  Patient stated that she did not like Coumadin in the past so she was not on any anticoagulation at this time.  She also had side effect from Lipitor and Zocor for generalized weakness.  PT recommended home health PT.    OBJECTIVE Vitals:   11/12/17 0027 11/12/17 0446 11/12/17 0800 11/12/17 0935  BP: (!) 218/100 140/68  (!) 195/89  Pulse:  (!) 52  72  Resp:  12  17  Temp:  98.1 F (36.7 C) 98.1 F (36.7 C) 98.2 F (36.8 C)  TempSrc:  Oral Oral Oral  SpO2:  92%  96%  Weight:      Height:        CBC:  Recent Labs  Lab 11/10/17 1036 11/10/17 1040  WBC 4.6  --   NEUTROABS 2.4  --   HGB 12.7 12.9  HCT 37.6 38.0  MCV 94.2  --   PLT 221  --     Basic Metabolic Panel:  Recent Labs  Lab 11/11/17 0914 11/12/17 0522  NA 135 134*  K 3.9 3.4*  CL 100 101  CO2 23 25  GLUCOSE 128* 92  BUN 22 23  CREATININE 1.21* 1.09*  CALCIUM 10.0 9.6    Lipid Panel:     Component Value Date/Time   CHOL 241 (H) 11/11/2017 0914   TRIG 61 11/11/2017 0914   HDL 55 11/11/2017 0914   CHOLHDL 4.4 11/11/2017 0914   VLDL 12 11/11/2017 0914   LDLCALC 174 (H) 11/11/2017 0914   HgbA1c:  Lab Results  Component Value Date   HGBA1C 5.5 11/11/2017   Urine Drug Screen: No results found for: LABOPIA, COCAINSCRNUR, LABBENZ, AMPHETMU, THCU, LABBARB  Alcohol Level No results found for: ETH  IMAGING   Ct Angio Head W Or Wo Contrast Ct Angio Neck W Or Wo Contrast 11/10/2017 IMPRESSION:  1. Negative for large vessel occlusion, but there are moderate to severe bilateral PCA P2/P3 segment stenoses worse on the left.  2. Positive also for bilateral ICA and distal vertebral artery Fibromuscular Dysplasia (FMD). Distal cervical Right ICA involvement including fusiform aneurysmal enlargement of the vessel 7-9 mm  diameter.  3. Positive also for a small 2-3 mm aneurysm versus infundibulum of the distal Left Vertebral Artery directed posteriorly as it crosses the dura. See series 6, image 240.  4. Abnormal right upper lobe reticulonodular pulmonary opacity is nonspecific. Recommend dedicated Chest CT.  5. Ectatic Ascending Aorta and arch, 31-34 mm diameter. Recommend annual imaging followup by CTA or MRA. This recommendation follows 2010 ACCF/AHA/AATS/ACR/ASA/SCA/SCAI/SIR/STS/SVM Guidelines for the Diagnosis and Management of Patients with Thoracic Aortic Disease. Circulation.2010; 121: e266-e369    Ct Head Wo Contrast 11/10/2017 IMPRESSION:  Acute nonhemorrhagic right basal ganglia infarct.    Ct Head Code Stroke Wo Contrast 11/10/2017 IMPRESSION:  1. Stable and normal for age noncontrast CT appearance of the brain.  2. ASPECTS is 10.    Transthoracic Echocardiogram - pending    PHYSICAL EXAM Blood pressure (!) 195/89, pulse 72, temperature 98.2 F (36.8 C), temperature source Oral, resp. rate 17, height 5\' 3"  (1.6 m), weight 65.5 kg, SpO2 96 %.  HEENT-  Normocephalic, no lesions, without obvious abnormality.  Normal external eye and conjunctiva.  Cardiovascular- S1-S2 audible, pulses palpable throughout   Lungs-no rhonchi or wheezing noted, no excessive working  breathing.  Saturations within normal limits on RA Abdomen- BS present x 4 quadrants Extremities- Warm, dry and intact Musculoskeletal-no joint tenderness, deformity or swelling Skin-warm and dry, no hyperpigmentation, vitiligo, or suspicious lesions  Neurological Examination Mental Status: Alert, oriented, thought content appropriate.  Speech fluent without evidence of aphasia.  Mild dysarthria noted.  Able to follow  commands without difficulty. Cranial Nerves: II:  Visual fields grossly normal,  III,IV, VI: ptosis not present, extra-ocular motions intact bilaterally, pupils equal, round, reactive to light and  accommodation V,VII: smile asymmetric, left facial droop, cool temp and facial light touch sensation normal bilaterally VIII: hearing normal bilaterally IX,X: uvula rises symmetrically XI: bilateral shoulder shrug XII: midline tongue extension Motor: Right :  Upper extremity   5/5              Left:     Upper extremity   4+/5             Lower extremity   5/5                          Lower extremity   4/5 Tone and bulk:normal tone throughout; no atrophy noted Sensory: cool temp and light touch intact throughout, bilaterally except LLE 80% light touch comparing with right Deep Tendon Reflexes: 2+ and symmetric biceps and patella Plantars: Right: downgoing                                Left: downgoing Cerebellar: normal finger-to-nose, normal rapid alternating movements and normal heel-to-shin test Gait: deferred   ASSESSMENT/PLAN INICE SANLUIS is an 83 y.o. female PMH significant for HTN, HLD, paroxysmal a. Fib ( not anticoagulated), CAD, CKD who presented to [facility] ED as a code stroke for left facial droop and left sided weakness.   Stroke: right basal ganglia infarct - likely embolic from afib not on [facility]   CT head - Acute nonhemorrhagic right basal ganglia infarct.   Cannot have MRI due to spinal stimulator   CTA head and neck - b/l P2/P3 stenosis, b/l ICA FMD and right distal ICA fusiform dilatation   2D Echo - pending  LDL - 173  HgbA1c - 5.5  VTE prophylaxis - [place] Heparin  Diet  - Heart healthy with thin liquids.  aspirin 81 mg daily prior to admission, now on aspirin 81 mg daily. Discussed with pt and will recommend DOAC with eliquis. Once eliquis started, ASA can be discontinued.   Patient counseled to be compliant with her antithrombotic medications  Ongoing aggressive stroke risk factor management  Therapy recommendations:  HH PT  Disposition:  Pending   PAF  Not on AC at home due to dislike coumadin in the past  Agree with DOACs  Will plan for eliquis  initiation before discharge.  Bilateral ICA FMD and right ICA fusiform dilatation  CTA head and neck showed b/l proximal ICA FMD appearance.   Also showed right ICA distal portion fusiform dilatation, but not something IR can help at this time  Will continue close monitoring  Will outpt referral to Dr. Corliss Skains for monitoring  Hypertension  Has been running high . Gradually normalize BP . Long-term BP goal normotensive  Hyperlipidemia  Lipid lowering medication PTA:  none  LDL 173, goal < 70  Lipitor intolerance in the past. Will put on pravastatin. If unable to tolerate statin, will need outpt PCP or lipid clinic  follow up to consider PCDK9 inhibitors as outpatient.   Other Stroke Risk Factors  Advanced age  Mild Coronary artery disease by cath 2004  Other Active Problems  Abnormal right upper lobe reticulonodular pulmonary opacity.  Chest CT recommended.  Ectatic Ascending Aorta and arch, 31-34 mm diameter. Follow up recommended.  CKD   Hospital day # 2  Neurology will sign off. Please call with questions. Pt will follow up with stroke clinic NP at [facility] in about 4 weeks. Thanks for the consult.   Marvel Plan, MD PhD Stroke Neurology 11/12/2017 12:43 PM    To contact Stroke Continuity provider, please refer to [email or web address]. After hours, contact General Neurology

## 2017-11-12 NOTE — Progress Notes (Signed)
[place] TEAM 1 - Stepdown/ICU TEAM  NGA RABON  ZOX:[id number] DOB: March 25, 1934 DOA: 11/10/2017 PCP: Laurann Montana, MD    Brief Narrative:  83yo female w/ a hx of coronary artery disease, chronic kidney disease stage III, hypertension and paroxysmal atrial fibrillation who presented with left-sided weakness and facial droop. She went to bed in her usual state of health, and woke around 5 AM to note new left-sided weakness.  At 9 AM her weakness was persistent.  Patient had been off her hypertensive agents for about 3 days prior to hospitalization.   Subjective: Resting in bed. Reports signif dizziness when attempting to stand/ambulate today. Feels L sided weakness and facial droop have essentially resolved. Denies cp, n/v, or abdom pain.   Assessment & Plan:  Acute R basal ganglia CVA with left sided weakness Likely embolic due to afib w/ no anticoag - Neurology has evaluated - can't have MRI due to spinal stimulator - Neuro suggests stop ASA and start Eliquis (was on ASA prior to admit) - cont PT/OT efforts   Bilateral ICA FMD and right ICA fusiform dilatation CTA head and neck showed B proximal ICA FMD appearance and right ICA distal portion fusiform dilatation, but not something IR can help at this time - outpt referral to Dr. Corliss Skains for monitoring per Neuro  Atrial fibrillation (paroxysmal) Patient is rate controlled on BB - ChadsVasc is 6 - Eliquis to be started   Uncontrolled HTN, hypertensive emergency BP remains very poorly controlled - med tx adjusted further today - this may be cause of her dizziness  Dyslipidemia history of intolerance of lipitor w/ generalized weakness - trail of pravachol started   Hypokalemia  Goal is K+ of 4.0 - supplement and follow - check Mg in AM   AKI on CKD stage 3 crt appears to have returned to her baseline for now   Right upper lobe reticulonodular pulmonary opacity chest CT recommended and now ordered   Ectatic Ascending Aorta  and arch, 31-34 mm diameter Follow up recommended in 1 year    DVT prophylaxis: lovenox  Code Status: FULL CODE Family Communication: spoke w/ family at bedside  Disposition Plan: home when BP better controlled and more mobile   Consultants:  Neurology  Antimicrobials:  none   Objective: Blood pressure (!) 195/89, pulse 72, temperature 98.2 F (36.8 C), temperature source Oral, resp. rate 17, height 5\' 3"  (1.6 m), weight 65.5 kg, SpO2 96 %.  Intake/Output Summary (Last 24 hours) at 11/12/2017 1319 Last data filed at 11/12/2017 0900 Gross per 24 hour  Intake 240 ml  Output 700 ml  Net -460 ml   Filed Weights   11/10/17 1845  Weight: 65.5 kg    Examination: General: No acute respiratory distress Lungs: Clear to auscultation bilaterally without wheezes or crackles Cardiovascular: Regular rate and rhythm without murmur gallop or rub normal S1 and S2 Abdomen: Nontender, nondistended, soft, bowel sounds positive, no rebound, no ascites, no appreciable mass Extremities: No significant cyanosis, clubbing, or edema bilateral lower extremities  CBC: Recent Labs  Lab 11/10/17 1036 11/10/17 1040  WBC 4.6  --   NEUTROABS 2.4  --   HGB 12.7 12.9  HCT 37.6 38.0  MCV 94.2  --   PLT 221  --    Basic Metabolic Panel: Recent Labs  Lab 11/10/17 1036 11/10/17 1040 11/11/17 0914 11/12/17 0522  NA 134* 135 135 134*  K 3.8 3.8 3.9 3.4*  CL 101 100 100 101  CO2 22  --  23 25  GLUCOSE 102* 98 128* 92  BUN 22 24* 22 23  CREATININE 1.19* 1.30* 1.21* 1.09*  CALCIUM 9.6  --  10.0 9.6   GFR: Estimated Creatinine Clearance: 36.8 mL/min (A) (by C-G formula based on SCr of 1.09 mg/dL (H)).  Liver Function Tests: Recent Labs  Lab 11/10/17 1036  AST 22  ALT 13  ALKPHOS 60  BILITOT 0.6  PROT 6.1*  ALBUMIN 3.8    Coagulation Profile: Recent Labs  Lab 11/10/17 1036  INR 0.90    HbA1C: Hgb A1c MFr Bld  Date/Time Value Ref Range Status  11/11/2017 05:28 AM 5.5 4.8 -  5.6 % Final    Comment:    (NOTE) Pre diabetes:          5.7%-6.4% Diabetes:              >6.4% Glycemic control for   <7.0% adults with diabetes     Recent Results (from the past 240 hour(s))  MRSA PCR Screening     Status: None   Collection Time: 11/10/17  7:21 PM  Result Value Ref Range Status   MRSA by PCR NEGATIVE NEGATIVE Final    Comment:        The GeneXpert MRSA Assay (FDA approved for NASAL specimens only), is one component of a comprehensive MRSA colonization surveillance program. It is not intended to diagnose MRSA infection nor to guide or monitor treatment for MRSA infections. Performed at [facility], [address], [place], [place] [address]      Scheduled Meds: . amLODipine  5 mg Oral Daily  . flecainide  50 mg Oral Daily  . heparin  5,000 Units Subcutaneous Q8H  . metoprolol tartrate  25 mg Oral BID  . pravastatin  40 mg Oral q1800     LOS: 2 days   Lonia Blood, MD [facility] Office  [phone] Pager - Text Page per Loretha Stapler  If 7PM-7AM, please contact night-coverage per Amion 11/12/2017, 1:19 PM

## 2017-11-12 NOTE — Evaluation (Signed)
Occupational Therapy Evaluation Patient Details Name: Cheryl [name] MRN: [id number] DOB: [birth year] Today's Date: 11/12/2017    History of Present Illness Cheryl [name]  is a 83 y.o. female, with medical history significant forcoronary artery disease, chronic kidney disease stage III, hypertension, and paroxysmal atrial fibrillation, patient presents to ED with complaints of left-sided weakness and facial droop, reports she went to bed at midnight at her usual state of health, but she woke up around 5 AM to use the restroom, she felt wobbly with some weakness on the left side, so this is because she was sleepy, went back to bed, woke up around 9 AM with these weakness has persisted; Stat CT head revealed right basal ganglia infarct.   Clinical Impression   Limited eval today due to pt became dizzy, nauseous and having headache with sitting EOB. Spouse present for session. Will continue to follow to further assess for ADLs. Pt will benefit from OT to progress ADL independence. 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    Follow Up Recommendations  Home health OT;Supervision/Assistance - 24 hour    Equipment Recommendations  3 in 1 bedside commode    Recommendations for Other Services       Precautions / Restrictions Precautions Precautions: Fall;Other (comment) Precaution Comments: watch BP Restrictions Weight Bearing Restrictions: No      Mobility Bed Mobility Overal bed mobility: Needs Assistance Bed Mobility: Supine to Sit;Sit to Supine     Supine to sit: Min assist;HOB elevated Sit to supine: Min assist;HOB elevated   General bed mobility comments: +rail, guarded, increased time and effort  Transfers                 General transfer comment: unable to progress beyond EOB due to dizziness, nausea, and HA upon sitting. BP sitting 189/108. BP after return to supine 186/91    Balance                                            ADL either performed or assessed with clinical judgement   ADL Overall ADL's : Needs assistance/impaired                                       General ADL Comments: Only able to tolerate briefly at EOB due to headache, dizziness and nausea. Assisted back to supine and helped pt to be more comfortable. BP at EOB 189/108 and once back in supine was 186/91. Pt reports no difficulty with her vision. Unable to attempt donning socks at EOB due to dizziness and nausea increasing. Will continue to assess ADL as pt able to tolerate.      Vision Patient Visual Report: No change from baseline       Perception     Praxis      Pertinent Vitals/Pain Pain Assessment: Faces Faces Pain Scale: Hurts even more Pain Location: low back Pain Descriptors / Indicators: Sore;Grimacing;Guarding Pain Intervention(s): Monitored during session;Limited activity within patient's tolerance;Repositioned     Hand Dominance Left   Extremity/Trunk Assessment Upper Extremity Assessment Upper Extremity Assessment: Overall WFL for tasks assessed          Communication Communication Communication: No difficulties  Cognition Arousal/Alertness: Awake/alert Behavior During Therapy: WFL for tasks assessed/performed Overall Cognitive Status: Within Functional Limits for tasks assessed                                     General Comments       Exercises     Shoulder Instructions      Home Living Family/patient expects to be discharged to:: Private residence Living Arrangements: Spouse/significant other(Pt's husband has Alzheimer's. She is his caretaker. ) Available Help at Discharge: Family;Available 24 hours/day Type of Home: House Home Access: Stairs to enter [employer] of Steps: 1 Entrance Stairs-Rails: None Home Layout: One level     Bathroom Shower/Tub: Producer, television/film/video:  Standard     Home Equipment: Cane - single point;Shower seat          Prior Functioning/Environment Level of Independence: Independent        Comments: Drives. Active.        OT Problem List: Decreased strength;Decreased knowledge of use of DME or AE;Decreased activity tolerance      OT Treatment/Interventions: Self-care/ADL training;DME and/or AE instruction;Therapeutic activities;Patient/family education    OT Goals(Current goals can be found in the care plan section) Acute Rehab OT Goals Patient Stated Goal: home OT Goal Formulation: With patient Time For Goal Achievement: 11/26/17 Potential to Achieve Goals: Good  OT Frequency: Min 2X/week   Barriers to D/C:            Co-evaluation PT/OT/SLP Co-Evaluation/Treatment: Yes Reason for Co-Treatment: For patient/therapist safety PT goals addressed during session: Mobility/safety with mobility;Balance OT goals addressed during session: ADL's and self-care      AM-PAC PT "6 Clicks" Daily Activity     Outcome Measure Help from another person eating meals?: None Help from another person taking care of personal grooming?: A Lot Help from another person toileting, which includes using toliet, bedpan, or urinal?: A Lot Help from another person bathing (including washing, rinsing, drying)?: A Lot Help from another person to put on and taking off regular upper body clothing?: A Lot Help from another person to put on and taking off regular lower body clothing?: A Lot 6 Click Score: 14   End of Session    Activity Tolerance: Patient limited by pain;Other (comment)(dizziness and nausea) Patient left: in bed;with call bell/phone within reach;with family/visitor present  OT Visit Diagnosis: Dizziness and giddiness (R42);Muscle weakness (generalized) (M62.81)                Time: 4098-1191 OT Time Calculation (min): 14 min Charges:  OT General Charges $OT Visit: 1 Visit OT Evaluation $OT Eval Moderate Complexity: 1  Mod    Cheryl [name] OTR/L Acute Rehabilitation [phone] 11/12/2017, 1:25 PM

## 2017-11-12 NOTE — Progress Notes (Signed)
  Echocardiogram 2D Echocardiogram has been performed.  Cheryl [name] F 11/12/2017, 3:09 PM

## 2017-11-12 NOTE — Progress Notes (Signed)
SLP Cancellation Note  Patient Details Name: HAWRAA STAMBAUGH MRN: [id number] DOB: 1934/09/03   Cancelled treatment:       Reason Eval/Treat Not Completed: Patient at procedure or test/unavailable (having testing done in room). WIll f/u as able.   Maxcine Ham 11/12/2017, 3:04 PM  Maxcine Ham, M.A. CCC-SLP Acute Herbalist [phone] Office [phone]

## 2017-11-13 LAB — BASIC METABOLIC PANEL
Anion gap: 9 (ref 5–15)
BUN: 30 mg/dL — AB (ref 8–23)
CO2: 25 mmol/L (ref 22–32)
Calcium: 10 mg/dL (ref 8.9–10.3)
Chloride: 102 mmol/L (ref 98–111)
Creatinine, Ser: 1.27 mg/dL — ABNORMAL HIGH (ref 0.44–1.00)
GFR calc Af Amer: 45 mL/min — ABNORMAL LOW (ref >60)
GFR calc non Af Amer: 38 mL/min — ABNORMAL LOW (ref >60)
GLUCOSE: 85 mg/dL (ref 70–99)
Potassium: 4.3 mmol/L (ref 3.5–5.1)
Sodium: 136 mmol/L (ref 135–145)

## 2017-11-13 LAB — CBC
HCT: 39.8 % (ref 36.0–46.0)
Hemoglobin: 13.3 g/dL (ref 12.0–15.0)
MCH: 31.4 pg (ref 26.0–34.0)
MCHC: 33.4 g/dL (ref 30.0–36.0)
MCV: 93.9 fL (ref 80.0–100.0)
Platelets: 252 10*3/uL (ref 150–400)
RBC: 4.24 MIL/uL (ref 3.87–5.11)
RDW: 12.1 % (ref 11.5–15.5)
WBC: 8 10*3/uL (ref 4.0–10.5)

## 2017-11-13 LAB — MAGNESIUM: Magnesium: 1.8 mg/dL (ref 1.7–2.4)

## 2017-11-13 MED ORDER — SODIUM CHLORIDE 0.9 % IV SOLN
INTRAVENOUS | Status: AC
  Administered 2017-11-13: 19:00:00 via INTRAVENOUS

## 2017-11-13 NOTE — Plan of Care (Signed)
POC reviewed with patient, no acute changes throughout the night. PRN flexeril X 1, Xanax X 1, and Trazodone X 1, no adverse response. RN to report off to oncoming Rn.

## 2017-11-13 NOTE — Plan of Care (Signed)
  Problem: Education: Goal: Knowledge of disease or condition will improve Outcome: Adequate for Discharge   Problem: Education: Goal: Knowledge of secondary prevention will improve Outcome: Adequate for Discharge   Problem: Coping: Goal: Will verbalize positive feelings about self Outcome: Adequate for Discharge   Problem: Coping: Goal: Will identify appropriate support needs Outcome: Adequate for Discharge

## 2017-11-13 NOTE — Progress Notes (Signed)
[place] TEAM 1 - Stepdown/ICU TEAM  Cheryl [name]  ZOX:[id number] DOB: [birth year] DOA: 11/10/2017 PCP: Laurann Montana, MD    Brief Narrative:  83yo female w/ a hx of coronary artery disease, chronic kidney disease stage III, hypertension and paroxysmal atrial fibrillation who presented with left-sided weakness and facial droop. She went to bed in her usual state of health, and woke around 5 AM to note new left-sided weakness.  At 9 AM her weakness was persistent.  Patient had been off her hypertensive agents for about 3 days prior to hospitalization.   Subjective: The pt tells me she feels weak and unstable on her feet. She tells me she is not yet ready for d/c home as she fears she will be unsafe ambulating/caring for herself. She denies cp, sob, n/v, or abdomp pain.   Assessment & Plan:  Acute R basal ganglia CVA with left sided weakness Likely embolic due to afib w/ no anticoag - Neurology has evaluated - can't have MRI due to spinal stimulator - Neuro suggests stop ASA and start Eliquis (was on ASA prior to admit) - cont PT/OT efforts   Bilateral ICA FMD and right ICA fusiform dilatation CTA head and neck showed B proximal ICA FMD appearance and right ICA distal portion fusiform dilatation, but not something IR can help at this time - outpt referral to Dr. Corliss Skains for monitoring per Neuro  Atrial fibrillation (paroxysmal) Patient is rate controlled on BB - ChadsVasc is 6 - Eliquis has been started   Uncontrolled HTN, hypertensive emergency BP now much better controlled   Dyslipidemia history of intolerance of lipitor w/ generalized weakness - trail of pravachol started   Hypokalemia  Corrected to goal - Mg is acceptable    AKI on CKD stage 3 Appears mildly volume depleted - gently hydrate over night and see if this helps w/ her weakness   Right upper lobe reticulonodular pulmonary opacity chest CT w/o worrisome findings   Ectatic Ascending Aorta and arch, 31-34 mm  diameter Follow up recommended in 1 year    DVT prophylaxis: lovenox  Code Status: FULL CODE Family Communication: spoke w/ husband at bedside  Disposition Plan: not yet stable enough for d/c home - if does not improve in next 24hrs may have to consider temporary placement for prolonged rehab    Consultants:  Neurology  Antimicrobials:  none   Objective: Blood pressure 135/68, pulse 70, temperature 98 F (36.7 C), temperature source Oral, resp. rate 16, height 5\' 3"  (1.6 m), weight 65.5 kg, SpO2 93 %.  Intake/Output Summary (Last 24 hours) at 11/13/2017 1614 Last data filed at 11/13/2017 0544 Gross per 24 hour  Intake 50 ml  Output 200 ml  Net -150 ml   Filed Weights   11/10/17 1845  Weight: 65.5 kg    Examination: General: No acute respiratory distress Lungs: CTA B Cardiovascular: RRR - no M or rub  Abdomen: NT/ND, soft, BS+, no mass Extremities: No signif edema B LE   CBC: Recent Labs  Lab 11/10/17 1036 11/10/17 1040 11/13/17 0307  WBC 4.6  --  8.0  NEUTROABS 2.4  --   --   HGB 12.7 12.9 13.3  HCT 37.6 38.0 39.8  MCV 94.2  --  93.9  PLT 221  --  252   Basic Metabolic Panel: Recent Labs  Lab 11/11/17 0914 11/12/17 0522 11/13/17 0307  NA 135 134* 136  K 3.9 3.4* 4.3  CL 100 101 102  CO2 23 25 25  GLUCOSE 128* 92 85  BUN 22 23 30*  CREATININE 1.21* 1.09* 1.27*  CALCIUM 10.0 9.6 10.0  MG  --   --  1.8   GFR: Estimated Creatinine Clearance: 31.6 mL/min (A) (by C-G formula based on SCr of 1.27 mg/dL (H)).  Liver Function Tests: Recent Labs  Lab 11/10/17 1036  AST 22  ALT 13  ALKPHOS 60  BILITOT 0.6  PROT 6.1*  ALBUMIN 3.8    Coagulation Profile: Recent Labs  Lab 11/10/17 1036  INR 0.90    HbA1C: Hgb A1c MFr Bld  Date/Time Value Ref Range Status  11/11/2017 05:28 AM 5.5 4.8 - 5.6 % Final    Comment:    (NOTE) Pre diabetes:          5.7%-6.4% Diabetes:              >6.4% Glycemic control for   <7.0% adults with diabetes      Recent Results (from the past 240 hour(s))  MRSA PCR Screening     Status: None   Collection Time: 11/10/17  7:21 PM  Result Value Ref Range Status   MRSA by PCR NEGATIVE NEGATIVE Final    Comment:        The GeneXpert MRSA Assay (FDA approved for NASAL specimens only), is one component of a comprehensive MRSA colonization surveillance program. It is not intended to diagnose MRSA infection nor to guide or monitor treatment for MRSA infections. Performed at [facility], [address], [place], [place] [address]      Scheduled Meds: . amLODipine  10 mg Oral Daily  . apixaban  5 mg Oral Q12H  . flecainide  50 mg Oral Daily  . lisinopril  5 mg Oral Daily  . metoprolol tartrate  25 mg Oral BID  . pravastatin  40 mg Oral q1800     LOS: 3 days   Lonia Blood, MD [facility] Office  [phone] Pager - Text Page per Amion  If 7PM-7AM, please contact night-coverage per Amion 11/13/2017, 4:14 PM

## 2017-11-13 NOTE — Evaluation (Signed)
Speech Language Pathology Evaluation Patient Details Name: Cheryl [name] MRN: [id number] DOB: [birth year] Today's Date: 11/13/2017 Time: 4403-4742 SLP Time Calculation (min) (ACUTE ONLY): 29 min  Problem List:  Patient Active Problem List   Diagnosis Date Noted  . Acute CVA (cerebrovascular accident) (HCC) 11/10/2017  . Hypertensive emergency 11/10/2017  . S/P insertion of spinal cord stimulator 10/11/2017  . Chronic pain syndrome 10/11/2017  . Post laminectomy syndrome 10/11/2017  . Radiculopathy, lumbar region 10/11/2017  . Headache 02/23/2017  . Nausea & vomiting 02/23/2017  . Hyponatremia 02/23/2017  . Hypertensive urgency 02/23/2017  . Lumbar herniated disc 02/15/2015  . CKD (chronic kidney disease) stage 3, GFR 30-59 ml/min (HCC) 06/22/2014  . Spondylolisthesis of lumbar region 08/07/2013  . Hyperlipidemia 06/13/2008  . PAF (paroxysmal atrial fibrillation) (HCC) 06/13/2008  . Essential hypertension 11/05/2006  . Coronary atherosclerosis 11/05/2006   Past Medical History:  Past Medical History:  Diagnosis Date  . Arthritis   . CAD (coronary artery disease)    Mild per remote cath in 2004; normal nuclear in 2012  . Chronic kidney disease    stage3 kidney disease   . Complication of anesthesia    slow to wake up  . Diastolic dysfunction    Grade I, per echo in 2012  . Dysrhythmia   . GERD (gastroesophageal reflux disease)    prevacid  . H/O hiatal hernia   . Headache(784.0)    sinus  . High risk medication use    Flecainide therapy  . History of heartburn   . Hyperlipidemia   . Hypertension   . LVH (left ventricular hypertrophy)    Per echo in July 2012  . Normal nuclear stress test July 2012  . Obesity   . PAF (paroxysmal atrial fibrillation) (HCC)   . Pneumonia    20 yrs ago  . Syncope July 2012   Past Surgical History:  Past Surgical History:  Procedure Laterality Date  . APPENDECTOMY  1952  . BACK SURGERY  1980, 2010   x2  . CARDIAC  CATHETERIZATION  07/28/2002   EF GREATER THAN 55%; MILD CAD  . COLONOSCOPY    . DILATION AND CURETTAGE OF UTERUS    . LUMBAR LAMINECTOMY/DECOMPRESSION MICRODISCECTOMY Right 02/15/2015   Procedure: Right Thoracic twelve-Lumbar one  Microdiskectomy;  Surgeon: Tressie Stalker, MD;  Location: [facility];  Service: Neurosurgery;  Laterality: Right;  Thoracic/Lumbar  . TONSILLECTOMY     age 53 yrs  . TUBAL LIGATION     HPI:  Cheryl [name] is a 83 y.o. female with medical history significant for coronary artery disease, chronic kidney disease stage III, hypertension, and paroxysmal atrial fibrillation, GERD, PNA. and presented to ED 10/5 with complaints of slurred speech, left-sided weakness and facial droop, reports she went to bed at midnight at her usual state of health, but she woke up around 5 AM to use the restroom, she felt wobbly with some weakness on the left side, so this is because she was sleepy, went back to bed, woke up around 9 AM with these weakness has persisted; Head CT revealed right basal ganglia infarct.   Assessment / Plan / Recommendation Clinical Impression   Pt completed Montreal Cognitive Assessment [facility]) with a score of 25/30, indicating mild cognitive impairment according to this standardized measure (score of 26 or greater is considered "normal"). She engaged in simple conversation with ease, provided answers with appropriate degree of detail regarding her history, current medical status, and ADLs. Pt demonstrated intellectual awareness, focused,  sustained and selective attention throughout our evaluation. Micrographia noted during clock drawing task, however pt exhibited emergent awareness and independent verbal self-correction and suspect weakness of dominant also impacted performance. Pt exhibited working and short term memory WFL with ability to recall 4/5 learned words independently and only 1-word error during complex sentence repetition. Abstract thinking and thought  organization also determined to be in tact. Suspect pain medication contributed to slightly delayed processing time during more complex questions (math calculations and visuospatial/execute functioning tasks). Given current level of functioning and pt's husband and son available to assist as needed upon discharge, continued ST is not recommended at this time.     SLP Assessment  SLP Recommendation/Assessment: Patient does not need any further Speech Lanaguage Pathology Services SLP Visit Diagnosis: Cognitive communication deficit (R41.841)    Follow Up Recommendations  None    Frequency and Duration           SLP Evaluation Cognition  Overall Cognitive Status: Within Functional Limits for tasks assessed Arousal/Alertness: Suspect due to medications(awake but somewhat lethargic) Orientation Level: Oriented X4 Attention: Focused;Sustained;Selective Focused Attention: Appears intact Sustained Attention: Appears intact Selective Attention: Appears intact Memory: Appears intact Awareness: Appears intact Problem Solving: Appears intact Executive Function: Self Correcting Self Correcting: Appears intact Safety/Judgment: Appears intact       Comprehension  Auditory Comprehension Overall Auditory Comprehension: Appears within functional limits for tasks assessed Yes/No Questions: Not tested Commands: Not tested Conversation: Simple Interfering Components: Processing speed EffectiveTechniques: Extra processing time Visual Recognition/Discrimination Discrimination: Not tested Reading Comprehension Reading Status: Not tested    Expression Expression Primary Mode of Expression: Verbal Verbal Expression Overall Verbal Expression: Appears within functional limits for tasks assessed Initiation: No impairment Level of Generative/Spontaneous Verbalization: Conversation Repetition: (NT) Naming: No impairment Pragmatics: No impairment Written Expression Dominant Hand: Left Written  Expression: Not tested   Oral / Motor  Oral Motor/Sensory Function Overall Oral Motor/Sensory Function: Within functional limits Motor Speech Overall Motor Speech: Appears within functional limits for tasks assessed Respiration: Within functional limits Phonation: Normal Resonance: Within functional limits Articulation: Within functional limitis Intelligibility: Intelligible Motor Planning: Witnin functional limits Motor Speech Errors: Not applicable   Suzzette Righter, Student SLP                   Suzzette Righter 11/13/2017, 10:29 AM

## 2017-11-13 NOTE — Progress Notes (Signed)
Physical Therapy Treatment Patient Details Name: Cheryl [name] MRN: [id number] DOB: [birth year] Today's Date: 11/13/2017    History of Present Illness Cheryl [name]  is a 83 y.o. female, with medical history significant forcoronary artery disease, chronic kidney disease stage III, hypertension, and paroxysmal atrial fibrillation, patient presents to ED with complaints of left-sided weakness and facial droop, reports she went to bed at midnight at her usual state of health, but she woke up around 5 AM to use the restroom, she felt wobbly with some weakness on the left side, so this is because she was sleepy, went back to bed, woke up around 9 AM with these weakness has persisted; Stat CT head revealed right basal ganglia infarct.    PT Comments    Pt performed supine exercises, followed by transfer to edge of bed and OOB.  Pt remains to present with L sided weakness and general fatigue.  Pt continues to move slow and will require 24 hour supervision and physical assistance at home.  Plan next session for further progression of gait training at this time.      Follow Up Recommendations  Home health PT;Supervision/Assistance - 24 hour     Equipment Recommendations  Other (comment)(TBA)    Recommendations for Other Services       Precautions / Restrictions Precautions Precautions: Fall;Other (comment) Precaution Comments: watch BP Restrictions Weight Bearing Restrictions: No    Mobility  Bed Mobility Overal bed mobility: Needs Assistance Bed Mobility: Supine to Sit;Sit to Supine     Supine to sit: Min assist;HOB elevated     General bed mobility comments: +rail, guarded, increased time and effort, assistance to advance hip forward and elevate trunk into sitting.    Transfers Overall transfer level: Needs assistance Equipment used: Rolling walker (2 wheeled) Transfers: Sit to/from Stand Sit to Stand: Min assist         General transfer comment: Cues for hand placement to  and from seated surface.  Pt slow and weak on L side at time requring hand over hand guidance.  Ambulation/Gait Ambulation/Gait assistance: Min assist Gait Distance (Feet): 5 Feet Assistive device: Rolling walker (2 wheeled) Gait Pattern/deviations: Step-to pattern;Shuffle;Trunk flexed     General Gait Details: Cues for sequencing and RW position/safety to turn and back to recliner.     Stairs             Wheelchair Mobility    Modified Rankin (Stroke Patients Only) Modified Rankin (Stroke Patients Only) Pre-Morbid Rankin Score: No symptoms Modified Rankin: Moderate disability     Balance Overall balance assessment: Needs assistance   Sitting balance-Leahy Scale: Fair       Standing balance-Leahy Scale: Poor                              Cognition Arousal/Alertness: Awake/alert Behavior During Therapy: WFL for tasks assessed/performed Overall Cognitive Status: Within Functional Limits for tasks assessed                                        Exercises General Exercises - Lower Extremity Ankle Circles/Pumps: AROM;Both;20 reps;Supine Quad Sets: AROM;Both;10 reps;Supine Heel Slides: AROM;Both;10 reps;AAROM;Supine Hip ABduction/ADduction: AROM;AAROM;10 reps;Both;Supine Straight Leg Raises: AAROM;10 reps;Both;Supine;Limitations Straight Leg Raises Limitations: extensor lag noted Bilaterally.      General Comments        Pertinent Vitals/Pain Pain Assessment:  Faces Faces Pain Scale: Hurts a little bit Pain Location: low back, L leg Pain Descriptors / Indicators: Sore;Grimacing;Guarding Pain Intervention(s): Monitored during session;Repositioned;Ice applied    Home Living     Available Help at Discharge: Family Type of Home: House              Prior Function            PT Goals (current goals can now be found in the care plan section) Acute Rehab PT Goals Patient Stated Goal: home Potential to Achieve Goals:  Good Progress towards PT goals: Progressing toward goals    Frequency    Min 4X/week      PT Plan Current plan remains appropriate    Co-evaluation              AM-PAC PT "6 Clicks" Daily Activity  Outcome Measure  Difficulty turning over in bed (including adjusting bedclothes, sheets and blankets)?: A Lot Difficulty moving from lying on back to sitting on the side of the bed? : A Lot Difficulty sitting down on and standing up from a chair with arms (e.g., wheelchair, bedside commode, etc,.)?: Unable Help needed moving to and from a bed to chair (including a wheelchair)?: A Little Help needed walking in hospital room?: A Little Help needed climbing 3-5 steps with a railing? : A Little 6 Click Score: 14    End of Session Equipment Utilized During Treatment: Gait belt Activity Tolerance: Patient tolerated treatment well;Patient limited by fatigue Patient left: with call bell/phone within reach;with family/visitor present;in chair Nurse Communication: Mobility status PT Visit Diagnosis: Other abnormalities of gait and mobility (R26.89);Muscle weakness (generalized) (M62.81);Pain     Time: 1610-9604 PT Time Calculation (min) (ACUTE ONLY): 27 min  Charges:  $Therapeutic Exercise: 8-22 mins $Therapeutic Activity: 8-22 mins                     Joycelyn Rua, PTA Acute Rehabilitation Services Pager [phone] Office [phone]     Paddy Walthall Artis Delay 11/13/2017, 11:24 AM

## 2017-11-14 LAB — BASIC METABOLIC PANEL
Anion gap: 6 (ref 5–15)
BUN: 35 mg/dL — AB (ref 8–23)
CO2: 25 mmol/L (ref 22–32)
CREATININE: 1.35 mg/dL — AB (ref 0.44–1.00)
Calcium: 9.6 mg/dL (ref 8.9–10.3)
Chloride: 102 mmol/L (ref 98–111)
GFR calc Af Amer: 41 mL/min — ABNORMAL LOW (ref >60)
GFR, EST NON AFRICAN AMERICAN: 36 mL/min — AB (ref >60)
Glucose, Bld: 99 mg/dL (ref 70–99)
POTASSIUM: 4.4 mmol/L (ref 3.5–5.1)
SODIUM: 133 mmol/L — AB (ref 135–145)

## 2017-11-14 MED ORDER — LISINOPRIL 10 MG PO TABS: 10.0000 mg | ORAL_TABLET | Freq: Every day | ORAL | 3 refills | Status: DC

## 2017-11-14 MED ORDER — APIXABAN 5 MG PO TABS: 5.0000 mg | ORAL_TABLET | Freq: Two times a day (BID) | ORAL | 3 refills | Status: DC

## 2017-11-14 MED ORDER — TRAZODONE HCL 100 MG PO TABS: 100.0000 mg | ORAL_TABLET | Freq: Every evening | ORAL | 2 refills | Status: AC | PRN

## 2017-11-14 MED ORDER — ROSUVASTATIN CALCIUM 10 MG PO TABS: 10.0000 mg | ORAL_TABLET | Freq: Every day | ORAL | 5 refills | Status: DC

## 2017-11-14 MED ORDER — FLECAINIDE ACETATE 50 MG PO TABS: 50.0000 mg | ORAL_TABLET | Freq: Every day | ORAL | 3 refills | Status: DC

## 2017-11-14 MED ORDER — CYCLOBENZAPRINE HCL 5 MG PO TABS: 5.0000 mg | ORAL_TABLET | Freq: Three times a day (TID) | ORAL | 0 refills | Status: AC | PRN

## 2017-11-14 MED ORDER — METOPROLOL SUCCINATE ER 50 MG PO TB24: 50.0000 mg | ORAL_TABLET | Freq: Every day | ORAL | 5 refills | Status: DC

## 2017-11-14 MED ORDER — AMLODIPINE BESYLATE 10 MG PO TABS: 10.0000 mg | ORAL_TABLET | Freq: Every day | ORAL | 3 refills | Status: DC

## 2017-11-14 NOTE — Discharge Instructions (Signed)
1)You are taking Eliquis/Apixaban blood thinner to reduce risk of further strokes so Avoid ibuprofen/Advil/Aleve/Motrin/Goody Powders/Naproxen/BC powders/Meloxicam/Diclofenac/Indomethacin and other Nonsteroidal anti-inflammatory medications as these will make you more likely to bleed and can cause stomach ulcers, can also cause Kidney problems.   2)You have been started on Crestor/Rosuvastatin-cholesterol medicine to reduce your risk for  further strokes--- if you have significant side effects including muscle aches and pain, weakness please contact your doctor so that they can possibly change her to pravastatin or lovastatin  3)Please take your blood pressure medications as prescribed--- your goal is to keep your top blood pressure number under 140 and the bottom number on the 90 (< 140/90 mmhg)  4) follow-up with [facility] in 3 to 4 weeks for reevaluation/recheck,   5)also follow up with interventional radiologist Dr. Corliss Skains in 3 to  4 weeks due to abnormalities of blood vessels in the neck (internal carotid artery)  6)Repeat BMP/Kidney and electrolyte test in 3 to 4 days

## 2017-11-14 NOTE — Care Management Important Message (Signed)
Important Message  Patient Details  Name: Cheryl [name] MRN: [id number] Date of Birth: 01-17-1935   Medicare Important Message Given:  Yes    [name] 11/14/2017, 1:58 PM

## 2017-11-14 NOTE — Progress Notes (Signed)
Pt given discharge instructions, prescriptions, and care notes. Pt verbalized understanding AEB no further questions or concerns at this time. IV was discontinued, no redness, pain, or swelling noted at this time. Telemetry discontinued and Centralized Telemetry was notified. Pt left the floor via wheelchair with staff in stable condition. 

## 2017-11-14 NOTE — Progress Notes (Signed)
Occupational Therapy Treatment Patient Details Name: Cheryl [name] MRN: [id number] DOB: 1935-01-19 Today's Date: 11/14/2017    History of present illness Cheryl [name]  is a 83 y.o. female, with medical history significant forcoronary artery disease, chronic kidney disease stage III, hypertension, and paroxysmal atrial fibrillation, patient presents to ED with complaints of left-sided weakness and facial droop, reports she went to bed at midnight at her usual state of health, but she woke up around 5 AM to use the restroom, she felt wobbly with some weakness on the left side, so this is because she was sleepy, went back to bed, woke up around 9 AM with these weakness has persisted; Stat CT head revealed right basal ganglia infarct.   OT comments  Focus of session on L UE activities with med soft therapy putty, squeeze ball. Pt educated in importance of using L UE as much as possible during ADL. Pt self fed with set up seated in chair with L hand. Ambulating with min assist, L UE strength adequate for walker use. Appears to have some mild L inattention. Pt to have 24 hour assist of daughter upon discharge.  Follow Up Recommendations  Home health OT;Supervision/Assistance - 24 hour    Equipment Recommendations  3 in 1 bedside commode    Recommendations for Other Services      Precautions / Restrictions Precautions Precautions: Fall Precaution Comments: watch BP Restrictions Weight Bearing Restrictions: No       Mobility Bed Mobility      General bed mobility comments: pt in chair  Transfers Overall transfer level: Needs assistance Equipment used: Rolling walker (2 wheeled) Transfers: Sit to/from Stand Sit to Stand: Min assist         General transfer comment: cues for hand placement, steadying assist    Balance Overall balance assessment: Needs assistance Sitting-balance support: No upper extremity supported;Feet supported Sitting balance-Leahy Scale: Fair Sitting  balance - Comments: L lateral lean, pt with scoliosis, cues for posture before eating   Standing balance support: Bilateral upper extremity supported;During functional activity Standing balance-Leahy Scale: Poor Standing balance comment: Reliant on BUE support.                            ADL either performed or assessed with clinical judgement   ADL Overall ADL's : Needs assistance/impaired Eating/Feeding: Set up;Sitting Eating/Feeding Details (indicate cue type and reason): with L hand, issued red foam, but did not need Grooming: Oral care;Sitting Grooming Details (indicate cue type and reason): issued red foam for toothbrush, may also benefit from battery operated toothbrush                               General ADL Comments: pt with some L inattention     Vision       Perception     Praxis      Cognition Arousal/Alertness: Awake/alert Behavior During Therapy: Flat affect Overall Cognitive Status: Within Functional Limits for tasks assessed                                          Exercises    Shoulder Instructions       General Comments Educated about possible post acute rehab, however, pt and family refusing as pt's husband with dementia and needs to be  at home with him.      Pertinent Vitals/ Pain       Pain Assessment: No/denies pain  Home Living                                          Prior Functioning/Environment              Frequency  Min 2X/week        Progress Toward Goals  OT Goals(current goals can now be found in the care plan section)  Progress towards OT goals: Progressing toward goals  Acute Rehab OT Goals Patient Stated Goal: to go home today  OT Goal Formulation: With patient Time For Goal Achievement: 11/26/17 Potential to Achieve Goals: Good  Plan Discharge plan remains appropriate    Co-evaluation                 AM-PAC PT "6 Clicks" Daily Activity      Outcome Measure   Help from another person eating meals?: A Little Help from another person taking care of personal grooming?: A Little Help from another person toileting, which includes using toliet, bedpan, or urinal?: A Lot Help from another person bathing (including washing, rinsing, drying)?: A Lot Help from another person to put on and taking off regular upper body clothing?: A Little Help from another person to put on and taking off regular lower body clothing?: A Lot 6 Click Score: 15    End of Session Equipment Utilized During Treatment: Gait belt;Rolling walker  OT Visit Diagnosis: Hemiplegia and hemiparesis;Unsteadiness on feet (R26.81) Hemiplegia - Right/Left: Left Hemiplegia - dominant/non-dominant: Dominant Hemiplegia - caused by: Cerebral infarction   Activity Tolerance Patient tolerated treatment well   Patient Left in chair;with call bell/phone within reach;with nursing/sitter in room;with family/visitor present   Nurse Communication          Time: 1340-1406 OT Time Calculation (min): 26 min  Charges: OT General Charges $OT Visit: 1 Visit OT Treatments $Self Care/Home Management : 8-22 mins $Therapeutic Exercise: 8-22 mins  Martie Round, OTR/L Acute Rehabilitation Services Pager: [phone] Office: [phone]   Evern Bio 11/14/2017, 2:36 PM

## 2017-11-14 NOTE — Discharge Summary (Signed)
Cheryl [name], is a 83 y.o. female  DOB 05/29/1934  MRN [id number].  Admission date:  11/10/2017  Admitting Physician  Starleen Arms, MD  Discharge Date:  11/14/2017   Primary MD  Laurann Montana, MD  Recommendations for primary care physician for things to follow:   1)You are taking Eliquis/Apixaban blood thinner to reduce risk of further strokes so Avoid ibuprofen/Advil/Aleve/Motrin/Goody Powders/Naproxen/BC powders/Meloxicam/Diclofenac/Indomethacin and other Nonsteroidal anti-inflammatory medications as these will make you more likely to bleed and can cause stomach ulcers, can also cause Kidney problems.   2)You have been started on Crestor/Rosuvastatin-cholesterol medicine to reduce your risk for  further strokes--- if you have significant side effects including muscle aches and pain, weakness please contact your doctor so that they can possibly change her to pravastatin or lovastatin  3)Please take your blood pressure medications as prescribed--- your goal is to keep your top blood pressure number under 140 and the bottom number on the 90 (< 140/90 mmhg)  4) follow-up with [facility] in 3 to 4 weeks for reevaluation/recheck,   5)also follow up with interventional radiologist Dr. Corliss Skains in 3 to  4 weeks due to abnormalities of blood vessels in the neck (internal carotid artery)  6)Repeat BMP/Kidney and electrolyte test in 3 to 4 days   Admission Diagnosis  Acute ischemic stroke [facility]) [I63.9]   Discharge Diagnosis  Acute ischemic stroke (HCC) [I63.9]    Principal Problem:   Acute CVA (cerebrovascular accident) (HCC) Active Problems:   Hyperlipidemia   Essential hypertension   Coronary atherosclerosis   PAF (paroxysmal atrial fibrillation) (HCC)   CKD (chronic kidney disease) stage 3, GFR 30-59 ml/min [facility])   Hypertensive emergency      Past Medical History:    Diagnosis Date  . Arthritis   . CAD (coronary artery disease)    Mild per remote cath in 2004; normal nuclear in 2012  . Chronic kidney disease    stage3 kidney disease   . Complication of anesthesia    slow to wake up  . Diastolic dysfunction    Grade I, per echo in 2012  . Dysrhythmia   . GERD (gastroesophageal reflux disease)    prevacid  . H/O hiatal hernia   . Headache(784.0)    sinus  . High risk medication use    Flecainide therapy  . History of heartburn   . Hyperlipidemia   . Hypertension   . LVH (left ventricular hypertrophy)    Per echo in July 2012  . Normal nuclear stress test July 2012  . Obesity   . PAF (paroxysmal atrial fibrillation) (HCC)   . Pneumonia    20 yrs ago  . Syncope July 2012    Past Surgical History:  Procedure Laterality Date  . APPENDECTOMY  1952  . BACK SURGERY  1980, 2010   x2  . CARDIAC CATHETERIZATION  07/28/2002   EF GREATER THAN 55%; MILD CAD  . COLONOSCOPY    . DILATION AND CURETTAGE OF UTERUS    . LUMBAR  LAMINECTOMY/DECOMPRESSION MICRODISCECTOMY Right 02/15/2015   Procedure: Right Thoracic twelve-Lumbar one  Microdiskectomy;  Surgeon: Tressie Stalker, MD;  Location: [facility];  Service: Neurosurgery;  Laterality: Right;  Thoracic/Lumbar  . TONSILLECTOMY     age 3 yrs  . TUBAL LIGATION         HPI  from the history and physical done on the day of admission:      Cheryl [name]  is a 83 y.o. female, with medical history significant forcoronary artery disease, chronic kidney disease stage III, hypertension, and paroxysmal atrial fibrillation, patient presents to ED with complaints of left-sided weakness and facial droop, reports she went to bed at midnight at her usual state of health, but she woke up around 5 AM to use the restroom, she felt wobbly with some weakness on the left side, so this is because she was sleepy, went back to bed, woke up around 9 AM with these weakness has persisted, noted her to come to ED, he denies  any chest pain, shortness of breath, any visual problems, any tingling, numbness, headache, she does not smoke, does not use alcohol, pressure was elevated in ED, daughter tells me she is out of her medicine for last 3 days. -ED blood pressure significantly uncontrolled to 63/150, daughter report she is out of her meds for last 3 days, PA head and neck significant for no large vessel disease, EEG showing normal sinus rhythm, she is known with history of paroxysmal A. fib in the past, she received full dose aspirin and I was called to admit.    Hospital Course:    Brief Narrative:  83yo female w/ a hx of coronary artery disease, chronic kidney disease stage III, hypertension and paroxysmal atrial fibrillation who presented with left-sided weakness and facial droop. She went to bed in her usual state of health, and woke around 5 AM to note new left-sided weakness. At 9 AM her weakness was persistent. Patient had been off her hypertensive agents for about 3 days prior to hospitalization.   Assessment & Plan:  1)Acute R basal ganglia CVA with left sided weakness Likely embolic due to afib w/ no anticoag - Neurology has evaluated - can't have MRI due to spinal stimulator - Neuro suggests stop ASA and start Eliquis (was on ASA prior to admit) -PT eval appreciated  2)Bilateral ICA FMD and right ICA fusiform dilatation- CTA head and neck showed B proximal ICA FMD appearance and right ICA distal portion fusiform dilatation, but not something IR can help at this time - outpt referral to Dr. Corliss Skains advised as per neuro team  3)Atrial fibrillation (paroxysmal) -continue Toprol-XL 50 mg daily for rate control, ChadsVasc is 6 - Eliquis has been started   4)Uncontrolled HTN, hypertensive emergency--- improved, amlodipine 10 mg daily and metoprolol XL 50 mg daily as ordered  5)Dyslipidemia- history of intolerance of lipitor w/ generalized weakness -given acute stroke okay to try Crestor if unable to  tolerate Crestor may try   6)AKI on CKD stage 3--most likely due to dehydration, stable with hydration,  7)Right upper lobe reticulonodular pulmonary opacity- chest CT w/o worrisome findings   8)ectatic Ascending Aorta and arch, 31-34 mm diameter Follow up recommended in 1 year   Consultants:  Neurology  Discharge Condition: STABLE  Follow UP  Follow-up Information    Julieanne Cotton, MD. Schedule an appointment as soon as possible for a visit in 4 week(s).   Specialties:  Interventional Radiology, Radiology Contact information: [address] [place]  [address] [phone]        [facility]. Schedule an appointment as soon as possible for a visit in 4 week(s).   Specialty:  Neurology Contact information: [address] [place] [address] [phone]       [facility]-Home Follow up.   Specialty:  Home Health Services Why:  home health arranged  Contact information: [address] [place] [address] [phone]        [facility] - Dme .   Contact information: [address] [place] [address] [phone]          Diet and Activity recommendation:  As advised  Discharge Instructions     Discharge Instructions    Ambulatory referral to Neurology   Complete by:  As directed    Follow up with stroke clinic NP (Jessica Vanschaick or Darrol Angel, if both not available, consider Manson Allan, or Ahern) at [facility] in about 4 weeks. Thanks.   Call MD for:  difficulty breathing, headache or visual disturbances   Complete by:  As directed    Call MD for:  persistant dizziness or light-headedness   Complete by:  As directed    Call MD for:  persistant nausea and vomiting   Complete by:  As directed    Call MD for:  severe uncontrolled pain   Complete by:  As directed    Call MD for:  temperature >100.4   Complete by:  As directed    Diet - low  sodium heart healthy   Complete by:  As directed    Discharge instructions   Complete by:  As directed    1)You are taking Eliquis/Apixaban blood thinner to reduce risk of further strokes so Avoid ibuprofen/Advil/Aleve/Motrin/Goody Powders/Naproxen/BC powders/Meloxicam/Diclofenac/Indomethacin and other Nonsteroidal anti-inflammatory medications as these will make you more likely to bleed and can cause stomach ulcers, can also cause Kidney problems.   2)You have been started on Crestor/Rosuvastatin-cholesterol medicine to reduce your risk for  further strokes--- if you have significant side effects including muscle aches and pain, weakness please contact your doctor so that they can possibly change her to pravastatin or lovastatin  3)Please take your blood pressure medications as prescribed--- your goal is to keep your top blood pressure number under 140 and the bottom number on the 90 (< 140/90 mmhg)  4) follow-up with [facility] in 3 to 4 weeks for reevaluation/recheck,   5)also follow up with interventional radiologist Dr. Corliss Skains in 3 to  4 weeks due to abnormalities of blood vessels in the neck (internal carotid artery)   Increase activity slowly   Complete by:  As directed         Discharge Medications     Allergies as of 11/14/2017      Reactions   Codeine Phosphate    headache   Lipitor [atorvastatin Calcium]    Muscle pain and tiredness   Sulfonamide Derivatives    REACTION: unspecified      Medication List    STOP taking these medications   aspirin EC 81 MG tablet   cloNIDine 0.1 MG tablet Commonly known as:  CATAPRES   diazepam 5 MG tablet Commonly known as:  VALIUM   ibuprofen 200 MG tablet Commonly known as:  ADVIL,MOTRIN     TAKE these medications   acetaminophen 500 MG tablet Commonly known as:  TYLENOL Take 500-1,000 mg by mouth every 6 (six) hours as needed for mild  pain.   amLODipine 10 MG tablet Commonly known as:   NORVASC Take 1 tablet (10 mg total) by mouth daily. Start taking on:  11/15/2017   apixaban 5 MG Tabs tablet Commonly known as:  ELIQUIS Take 1 tablet (5 mg total) by mouth 2 (two) times daily.   Biotin 10 MG Caps Take 10 mg by mouth daily.   cyclobenzaprine 5 MG tablet Commonly known as:  FLEXERIL Take 1 tablet (5 mg total) by mouth 3 (three) times daily as needed for muscle spasms.   docusate sodium 100 MG capsule Commonly known as:  COLACE Take 1 capsule (100 mg total) by mouth 2 (two) times daily. What changed:    when to take this  reasons to take this   flecainide 50 MG tablet Commonly known as:  TAMBOCOR Take 1 tablet (50 mg total) by mouth daily. What changed:  when to take this   lisinopril 10 MG tablet Commonly known as:  PRINIVIL,ZESTRIL Take 1 tablet (10 mg total) by mouth daily. Start taking on:  11/15/2017 What changed:    medication strength  how much to take   loratadine 10 MG tablet Commonly known as:  CLARITIN Take 10 mg by mouth daily as needed for allergies.   metoprolol succinate 50 MG 24 hr tablet Commonly known as:  TOPROL-XL Take 1 tablet (50 mg total) by mouth daily.   rosuvastatin 10 MG tablet Commonly known as:  CRESTOR Take 1 tablet (10 mg total) by mouth at bedtime. For Cholesterol/For Stroke Prevention   traZODone 100 MG tablet Commonly known as:  DESYREL Take 1 tablet (100 mg total) by mouth at bedtime as needed for sleep. What changed:    when to take this  reasons to take this   Vitamin D 2000 units tablet Take 2,000 Units by mouth daily.            Durable Medical Equipment  (From admission, onward)         Start     Ordered   11/14/17 1456  For home use only DME 4 wheeled rolling walker with seat  (Walkers)  Once    Question:  Patient needs a walker to treat with the following condition  Answer:  Stroke (HCC)   11/14/17 1501   11/14/17 1456  DME 3-in-1  Once    Comments:  Stroke   11/14/17 1501    11/14/17 1112  For home use only DME 3 n 1  Once     11/14/17 1114   11/14/17 1112  For home use only DME Walker rolling  Once    Question:  Patient needs a walker to treat with the following condition  Answer:  Weakness   11/14/17 1114          Major procedures and Radiology Reports - PLEASE review detailed and final reports for all details, in brief -     Ct Angio Head W Or Wo Contrast  Result Date: 11/10/2017 CLINICAL DATA:  83 year old female code stroke. Dizziness, headache, left side weakness. EXAM: CT ANGIOGRAPHY HEAD AND NECK TECHNIQUE: Multidetector CT imaging of the head and neck was performed using the standard protocol during bolus administration of intravenous contrast. Multiplanar CT image reconstructions and MIPs were obtained to evaluate the vascular anatomy. Carotid stenosis measurements (when applicable) are obtained utilizing NASCET criteria, using the distal internal carotid diameter as the denominator. CONTRAST:  50mL ISOVUE-370 IOPAMIDOL (ISOVUE-370) INJECTION 76%, <See Chart> ISOVUE-370 IOPAMIDOL (ISOVUE-370) INJECTION 76% COMPARISON:  Head CT  without contrast 1050 hours today. Brain MRI 03/04/2015. Chest CTA 03/09/2016. FINDINGS: CTA NECK Skeleton: Widespread cervical spine degeneration. Advanced left sternoclavicular joint degeneration. No acute osseous abnormality identified. Upper chest: Mosaic attenuation in both upper lungs. Superimposed reticulonodular apical interstitial opacity, worse on the right (series 5, image 28). This is new since 2018. Visible superior mediastinal lymph nodes appear stable. Other neck: Multiple hypodense thyroid nodules, likely benign. Negative for neck mass or lymphadenopathy. Aortic arch: 3 vessel arch configuration. Fusiform enlargement of the arch up to 31 millimeters diameter. Visible ascending aorta is 34 millimeters diameter. Proximal descending aorta appears to slowly taper. Mostly soft arch atherosclerotic plaque without great vessel  origin stenosis. Right carotid system: Mildly tortuous right CCA. Minimal CCA and right carotid bifurcation plaque with no stenosis. Tortuous cervical right ICA, and the vessel has a beaded appearance along with fusiform aneurysmal enlargement, 7-9 millimeters diameter at the C1 level (series 8 image 28). No stenosis. Left carotid system: Minimal left CCA and left carotid bifurcation atherosclerosis without stenosis. Tortuous left ICA distal to the bulb with a beaded appearance to the skull base (series 8, images 29 and 31). No stenosis. Vertebral arteries: No proximal right subclavian artery or right vertebral artery origin stenosis. Tortuous right V1 segment with a mildly kinked appearance. Mildly beaded appearance of the right V3 segment but no stenosis to the skull base. No proximal left subclavian artery or left vertebral artery stenosis with minimal plaque. Patent left vertebral artery to the skull base. Mildly to moderately beaded V3 segment appearance noted. There is a small 2-3 millimeter saccular lesion directed posteriorly from the vessel as it crosses the dura on series 6, image 240. CTA HEAD Posterior circulation: Mildly beaded appearance of the bilateral V4 vertebral artery segments. The left is mildly dominant. Both PICA origins are patent. No distal vertebral stenosis. Patent vertebrobasilar junction and basilar artery without stenosis. Mild basilar tortuosity. SCA and PCA origins are normal. Posterior communicating arteries are diminutive or absent. Both P1 segments are tortuous but without stenosis. There are tandem severe stenoses of the left PCA P2 and P3 segments seen on series 9, image 71. There is preserved distal enhancement. On the right there is moderate P2 segment stenosis with attenuated distal right PCA enhancement. Anterior circulation: Both ICA siphons are patent. On the left there is mild calcified plaque without stenosis. On the right there is mild calcified plaque without stenosis.  Normal ophthalmic artery origins. Patent carotid termini. The right A1 segment is dominant, the left is diminutive. ACA origins, anterior communicating artery, and bilateral ACA branches are within normal limits. Left MCA M1 segment, bifurcation, and left MCA branches are within normal limits. Right MCA M1 segment is patent without stenosis. Right MCA bifurcation is patent. No right MCA branch occlusion or high-grade stenosis is identified. Venous sinuses: Patent. Anatomic variants: Mildly dominant left vertebral artery. Review of the MIP images confirms the above findings IMPRESSION: 1. Negative for large vessel occlusion, but there are moderate to severe bilateral PCA P2/P3 segment stenoses worse on the left. 2. Positive also for bilateral ICA and distal vertebral artery Fibromuscular Dysplasia (FMD). Distal cervical Right ICA involvement including fusiform aneurysmal enlargement of the vessel 7-9 mm diameter. 3. Positive also for a small 2-3 mm aneurysm versus infundibulum of the distal Left Vertebral Artery directed posteriorly as it crosses the dura. See series 6, image 240. 4. Abnormal right upper lobe reticulonodular pulmonary opacity is nonspecific. Recommend dedicated Chest CT. 5. Ectatic Ascending Aorta and arch, 31-34  mm diameter. Recommend annual imaging followup by CTA or MRA. This recommendation follows 2010 ACCF/AHA/AATS/ACR/ASA/SCA/SCAI/SIR/STS/SVM Guidelines for the Diagnosis and Management of Patients with Thoracic Aortic Disease. Circulation.2010; 121: N829-F621 Electronically Signed   By: Odessa Fleming M.D.   On: 11/10/2017 11:30   Ct Head Wo Contrast  Result Date: 11/10/2017 CLINICAL DATA:  Follow-up code stroke from earlier today. Left-sided weakness and ataxia. Now with seizure-like activity. EXAM: CT HEAD WITHOUT CONTRAST TECHNIQUE: Contiguous axial images were obtained from the base of the skull through the vertex without intravenous contrast. COMPARISON:  Head CT 11/10/2017 at 1050 hours  FINDINGS: Brain: There is increased conspicuity of focal low-density in the right lentiform nucleus likely extending to the body of the right caudate nucleus consistent with an acute lateral lenticulostriate territory infarct. No intracranial hemorrhage, mass, midline shift, or extra-axial fluid collection is identified. The ventricles and sulci are normal. Vascular: Calcified atherosclerosis at the skull base. No hyperdense vessel. Skull: No fracture or focal osseous lesion. Sinuses/Orbits: Increased left sphenoid sinus fluid. Clear mastoid air cells. Bilateral cataract extraction. Other: None. IMPRESSION: Acute nonhemorrhagic right basal ganglia infarct. Electronically Signed   By: Sebastian Ache M.D.   On: 11/10/2017 20:53   Ct Angio Neck W Or Wo Contrast  Result Date: 11/10/2017 CLINICAL DATA:  83 year old female code stroke. Dizziness, headache, left side weakness. EXAM: CT ANGIOGRAPHY HEAD AND NECK TECHNIQUE: Multidetector CT imaging of the head and neck was performed using the standard protocol during bolus administration of intravenous contrast. Multiplanar CT image reconstructions and MIPs were obtained to evaluate the vascular anatomy. Carotid stenosis measurements (when applicable) are obtained utilizing NASCET criteria, using the distal internal carotid diameter as the denominator. CONTRAST:  50mL ISOVUE-370 IOPAMIDOL (ISOVUE-370) INJECTION 76%, <See Chart> ISOVUE-370 IOPAMIDOL (ISOVUE-370) INJECTION 76% COMPARISON:  Head CT without contrast 1050 hours today. Brain MRI 03/04/2015. Chest CTA 03/09/2016. FINDINGS: CTA NECK Skeleton: Widespread cervical spine degeneration. Advanced left sternoclavicular joint degeneration. No acute osseous abnormality identified. Upper chest: Mosaic attenuation in both upper lungs. Superimposed reticulonodular apical interstitial opacity, worse on the right (series 5, image 28). This is new since 2018. Visible superior mediastinal lymph nodes appear stable. Other neck:  Multiple hypodense thyroid nodules, likely benign. Negative for neck mass or lymphadenopathy. Aortic arch: 3 vessel arch configuration. Fusiform enlargement of the arch up to 31 millimeters diameter. Visible ascending aorta is 34 millimeters diameter. Proximal descending aorta appears to slowly taper. Mostly soft arch atherosclerotic plaque without great vessel origin stenosis. Right carotid system: Mildly tortuous right CCA. Minimal CCA and right carotid bifurcation plaque with no stenosis. Tortuous cervical right ICA, and the vessel has a beaded appearance along with fusiform aneurysmal enlargement, 7-9 millimeters diameter at the C1 level (series 8 image 28). No stenosis. Left carotid system: Minimal left CCA and left carotid bifurcation atherosclerosis without stenosis. Tortuous left ICA distal to the bulb with a beaded appearance to the skull base (series 8, images 29 and 31). No stenosis. Vertebral arteries: No proximal right subclavian artery or right vertebral artery origin stenosis. Tortuous right V1 segment with a mildly kinked appearance. Mildly beaded appearance of the right V3 segment but no stenosis to the skull base. No proximal left subclavian artery or left vertebral artery stenosis with minimal plaque. Patent left vertebral artery to the skull base. Mildly to moderately beaded V3 segment appearance noted. There is a small 2-3 millimeter saccular lesion directed posteriorly from the vessel as it crosses the dura on series 6, image 240. CTA HEAD Posterior  circulation: Mildly beaded appearance of the bilateral V4 vertebral artery segments. The left is mildly dominant. Both PICA origins are patent. No distal vertebral stenosis. Patent vertebrobasilar junction and basilar artery without stenosis. Mild basilar tortuosity. SCA and PCA origins are normal. Posterior communicating arteries are diminutive or absent. Both P1 segments are tortuous but without stenosis. There are tandem severe stenoses of the  left PCA P2 and P3 segments seen on series 9, image 71. There is preserved distal enhancement. On the right there is moderate P2 segment stenosis with attenuated distal right PCA enhancement. Anterior circulation: Both ICA siphons are patent. On the left there is mild calcified plaque without stenosis. On the right there is mild calcified plaque without stenosis. Normal ophthalmic artery origins. Patent carotid termini. The right A1 segment is dominant, the left is diminutive. ACA origins, anterior communicating artery, and bilateral ACA branches are within normal limits. Left MCA M1 segment, bifurcation, and left MCA branches are within normal limits. Right MCA M1 segment is patent without stenosis. Right MCA bifurcation is patent. No right MCA branch occlusion or high-grade stenosis is identified. Venous sinuses: Patent. Anatomic variants: Mildly dominant left vertebral artery. Review of the MIP images confirms the above findings IMPRESSION: 1. Negative for large vessel occlusion, but there are moderate to severe bilateral PCA P2/P3 segment stenoses worse on the left. 2. Positive also for bilateral ICA and distal vertebral artery Fibromuscular Dysplasia (FMD). Distal cervical Right ICA involvement including fusiform aneurysmal enlargement of the vessel 7-9 mm diameter. 3. Positive also for a small 2-3 mm aneurysm versus infundibulum of the distal Left Vertebral Artery directed posteriorly as it crosses the dura. See series 6, image 240. 4. Abnormal right upper lobe reticulonodular pulmonary opacity is nonspecific. Recommend dedicated Chest CT. 5. Ectatic Ascending Aorta and arch, 31-34 mm diameter. Recommend annual imaging followup by CTA or MRA. This recommendation follows 2010 ACCF/AHA/AATS/ACR/ASA/SCA/SCAI/SIR/STS/SVM Guidelines for the Diagnosis and Management of Patients with Thoracic Aortic Disease. Circulation.2010; 121: Z610-R604 Electronically Signed   By: Odessa Fleming M.D.   On: 11/10/2017 11:30   Ct Chest  Wo Contrast  Result Date: 11/12/2017 CLINICAL DATA:  Lung nodule EXAM: CT CHEST WITHOUT CONTRAST TECHNIQUE: Multidetector CT imaging of the chest was performed following the standard protocol without IV contrast. COMPARISON:  03/09/2016 FINDINGS: Cardiovascular: Cardiomegaly without pericardial effusion. Coronary arteriosclerosis is noted along the distal left main and RCA. Ectatic atherosclerotic thoracic aorta along the ascending portion to 3.9 cm. The unopacified pulmonary artery and branches are unremarkable. Mediastinum/Nodes: Small hiatal hernia. No lymphadenopathy. Patent trachea and mainstem bronchi. Esophagus is unremarkable. No thyromegaly. Low attenuating hypodensities are noted within both lobes of the thyroid gland the largest measuring approximately 1 cm in the right thyroid lobe. Lungs/Pleura: Linear atelectasis and/or scarring in the right upper lobe, lingula and left lower lobe. No effusion or pneumothorax. No dominant mass pulmonary consolidation. Minimal biapical pleural scarring. Upper Abdomen: Probable biliary sludge and tiny calculus of the included gallbladder. The unenhanced liver, spleen, adrenal glands and kidneys are nonacute. Mild pancreatic atrophy along the tail. Musculoskeletal: No acute nor aggressive osseous lesions. Neural stimulator device is identified extending up to the midthoracic level. IMPRESSION: Cardiomegaly with coronary arteriosclerosis and aortic atherosclerosis. No acute pulmonary disease or dominant mass. Small hypodense nodules of the thyroid gland the largest 10 mm. No worrisome features are identified. Aortic Atherosclerosis (ICD10-I70.0). Electronically Signed   By: Tollie Eth M.D.   On: 11/12/2017 20:16   Ct Head Code Stroke Wo Contrast  Result  Date: 11/10/2017 CLINICAL DATA:  Code stroke. 83 year old female with left side weakness. EXAM: CT HEAD WITHOUT CONTRAST TECHNIQUE: Contiguous axial images were obtained from the base of the skull through the vertex  without intravenous contrast. COMPARISON:  Head CT 02/23/2017.  Brain MRI 03/04/2015. FINDINGS: Brain: Cerebral volume is stable and normal for age. No midline shift, mass effect, or evidence of intracranial mass lesion. No ventriculomegaly. No acute intracranial hemorrhage identified. Stable and normal for age gray-white matter differentiation throughout the brain. No cortically based acute infarct identified. No cortical encephalomalacia identified. Vascular: Calcified atherosclerosis at the skull base. The major intracranial vascular structures appear stable. No suspicious intracranial vascular hyperdensity. Skull: Stable and negative. Sinuses/Orbits: Stable and generally well pneumatized paranasal sinuses. Chronic left sphenoid mucosal thickening. Tympanic cavities and mastoids are clear. Other: Stable and negative orbit and scalp soft tissues. ASPECTS (Alberta Stroke Program Early CT Score) - Ganglionic level infarction (caudate, lentiform nuclei, internal capsule, insula, M1-M3 cortex): 7 - Supraganglionic infarction (M4-M6 cortex): 3 Total score (0-10 with 10 being normal): 10 IMPRESSION: 1. Stable and normal for age noncontrast CT appearance of the brain. 2. ASPECTS is 10. 3. These results were communicated to Dr. Amada Jupiter at 11:14 amon 10/5/2019by text page via the [facility] messaging system. Electronically Signed   By: Odessa Fleming M.D.   On: 11/10/2017 11:15    Micro Results   Recent Results (from the past 240 hour(s))  MRSA PCR Screening     Status: None   Collection Time: 11/10/17  7:21 PM  Result Value Ref Range Status   MRSA by PCR NEGATIVE NEGATIVE Final    Comment:        The GeneXpert MRSA Assay (FDA approved for NASAL specimens only), is one component of a comprehensive MRSA colonization surveillance program. It is not intended to diagnose MRSA infection nor to guide or monitor treatment for MRSA infections. Performed at [facility], [address], [place], [place]  [address]        Today   Subjective    Mistey Hoffert today has no new complaints, eager to go home, family at bedside, plan of care reviewed          Patient has been seen and examined prior to discharge   Objective   Blood pressure 137/69, pulse 60, temperature 98 F (36.7 C), temperature source Oral, resp. rate 20, height 5\' 3"  (1.6 m), weight 65.5 kg, SpO2 100 %.   Intake/Output Summary (Last 24 hours) at 11/14/2017 1508 Last data filed at 11/14/2017 0957 Gross per 24 hour  Intake 886.66 ml  Output -  Net 886.66 ml    Exam Gen:- Awake Alert,  In no apparent distress  HEENT:- [place].AT, No sclera icterus Neck-Supple Neck,No JVD,.  Lungs-  CTAB , fair air movement CV- S1, S2 normal, irregularly irregular  abd-  +ve B.Sounds, Abd Soft, No tenderness,    Extremity/Skin:- No  edema,   good pulses Psych-affect is appropriate, oriented x3 Neuro-patient is left-hand dominant, now has left-sided hemiparesis   Data Review   CBC w Diff:  Lab Results  Component Value Date   WBC 8.0 11/13/2017   HGB 13.3 11/13/2017   HCT 39.8 11/13/2017   PLT 252 11/13/2017   LYMPHOPCT 34 11/10/2017   MONOPCT 9 11/10/2017   EOSPCT 5 11/10/2017   BASOPCT 1 11/10/2017    CMP:  Lab Results  Component Value Date   NA 133 (L) 11/14/2017   K 4.4 11/14/2017   CL  102 11/14/2017   CO2 25 11/14/2017   BUN 35 (H) 11/14/2017   CREATININE 1.35 (H) 11/14/2017   PROT 6.1 (L) 11/10/2017   ALBUMIN 3.8 11/10/2017   BILITOT 0.6 11/10/2017   ALKPHOS 60 11/10/2017   AST 22 11/10/2017   ALT 13 11/10/2017  .   Total Discharge time is about 33 minutes  Shon Hale M.D on 11/14/2017 at 3:08 PM  Pager---[phone]  Go to [email or web address] - password [email or web address] for contact info  [facility] - Office  [phone]

## 2017-11-14 NOTE — Care Management Note (Signed)
Case Management Note  Patient Details  Name: Cheryl [name] MRN: [id number] Date of Birth: 01-26-1935  Subjective/Objective:       CVA. Hx of coronary artery disease, chronic kidney disease stage III, hypertension, and paroxysmal atrial fibrillation.From home with husband. PTA independent with ADL's , no DME usage.  Cheryl [name] (Spouse)     [phone]      PCP: Laurann Montana  Action/Plan: Transition to home when medically stable with home health services to follow. Family to provide 24/7 supervision/ assistance once d/c states daughter, Donnamarie Poag [phone]).  Pt has transportation to home.  Expected Discharge Date:                  Expected Discharge Plan:  Home w Home Health Services  In-House Referral:     Discharge planning Services  CM Consult  Post Acute Care Choice:    Choice offered to:  Patient  DME Arranged:  3-N-1Dan Humphreys DME Agency:  [facility]  HH Arranged:  PT, OT, RN [facility] Agency:  [facility]  Status of Service:  In process, will continue to follow  If discussed at Long Length of Stay Meetings, dates discussed:    Additional Comments:  Epifanio Lesches, RN 11/14/2017, 11:42 AM

## 2017-11-14 NOTE — Progress Notes (Signed)
Physical Therapy Treatment Patient Details Name: Cheryl [name] MRN: [id number] DOB: 23-Jun-1934 Today's Date: 11/14/2017    History of Present Illness Cheryl [name]  is a 83 y.o. female, with medical history significant forcoronary artery disease, chronic kidney disease stage III, hypertension, and paroxysmal atrial fibrillation, patient presents to ED with complaints of left-sided weakness and facial droop, reports she went to bed at midnight at her usual state of health, but she woke up around 5 AM to use the restroom, she felt wobbly with some weakness on the left side, so this is because she was sleepy, went back to bed, woke up around 9 AM with these weakness has persisted; Stat CT head revealed right basal ganglia infarct.    PT Comments    Pt with improved tolerance for gait this session. Requiring min to min guard A for gait with RW, however, continues to be limited by fatigue. Required cues for L step height and knee extension during gait training. Feel pt would benefit from post acute rehab, however, pt reports she needs to go home with her husband who has dementia. Pt's daughter reports she will be able to assist as needed at d/c. Educated daughter about how to assist using gait belt if needed and adjusted RW height. Will continue to follow acutely to maximize functional mobility independence and safety.    Follow Up Recommendations  Home health PT;Supervision/Assistance - 24 hour(refusing post acute rehab )     Equipment Recommendations  Rolling walker with 5" wheels;3in1 (PT)    Recommendations for Other Services       Precautions / Restrictions Precautions Precautions: Fall;Other (comment) Precaution Comments: watch BP Restrictions Weight Bearing Restrictions: No    Mobility  Bed Mobility Overal bed mobility: Needs Assistance Bed Mobility: Supine to Sit     Supine to sit: Min assist     General bed mobility comments: Min A for assist with scooting hips to EOB.  Increased time to come to sitting and required heavy use of rail.   Transfers Overall transfer level: Needs assistance Equipment used: Rolling walker (2 wheeled) Transfers: Sit to/from Stand Sit to Stand: Min assist         General transfer comment: Min A for steadying assist to stand. Required verbal cues for safe hand placement. Per daughter, pt with improved technique to stand from previous session.   Ambulation/Gait Ambulation/Gait assistance: Min guard;Min assist Gait Distance (Feet): 100 Feet Assistive device: Rolling walker (2 wheeled) Gait Pattern/deviations: Step-to pattern;Shuffle;Trunk flexed Gait velocity: Decreased    General Gait Details: Pt with much improved tolerance for gait, however, distance limited by fatigue. Cues for appropriate step height on LLE during gait and cues for upright posture. Required cues for sequencing during turns. Educated pt's daughter about how to assist pt at home.    Stairs             Wheelchair Mobility    Modified Rankin (Stroke Patients Only) Modified Rankin (Stroke Patients Only) Pre-Morbid Rankin Score: No symptoms Modified Rankin: Moderately severe disability     Balance Overall balance assessment: Needs assistance Sitting-balance support: No upper extremity supported;Feet supported Sitting balance-Leahy Scale: Fair Sitting balance - Comments: Initially with L lateral lean, however, able to correct with cues.    Standing balance support: Bilateral upper extremity supported;During functional activity Standing balance-Leahy Scale: Poor Standing balance comment: Reliant on BUE support.  Cognition Arousal/Alertness: Awake/alert Behavior During Therapy: WFL for tasks assessed/performed Overall Cognitive Status: Within Functional Limits for tasks assessed                                        Exercises Other Exercises Other Exercises: Educated about exercise  program including LAQ, hip flexion, and ankle pumps.     General Comments General comments (skin integrity, edema, etc.): Educated about possible post acute rehab, however, pt and family refusing as pt's husband with dementia and needs to be at home with him.        Pertinent Vitals/Pain Pain Assessment: No/denies pain    Home Living                      Prior Function            PT Goals (current goals can now be found in the care plan section) Acute Rehab PT Goals Patient Stated Goal: to go home today  PT Goal Formulation: With patient Time For Goal Achievement: 11/26/17 Potential to Achieve Goals: Good Progress towards PT goals: Progressing toward goals    Frequency    Min 4X/week      PT Plan Current plan remains appropriate    Co-evaluation              AM-PAC PT "6 Clicks" Daily Activity  Outcome Measure  Difficulty turning over in bed (including adjusting bedclothes, sheets and blankets)?: A Lot Difficulty moving from lying on back to sitting on the side of the bed? : Unable Difficulty sitting down on and standing up from a chair with arms (e.g., wheelchair, bedside commode, etc,.)?: Unable Help needed moving to and from a bed to chair (including a wheelchair)?: A Little Help needed walking in hospital room?: A Little Help needed climbing 3-5 steps with a railing? : A Lot 6 Click Score: 12    End of Session Equipment Utilized During Treatment: Gait belt Activity Tolerance: Patient limited by fatigue Patient left: with call bell/phone within reach;with family/visitor present;in chair Nurse Communication: Mobility status PT Visit Diagnosis: Other abnormalities of gait and mobility (R26.89);Muscle weakness (generalized) (M62.81)     Time: 1610-9604 PT Time Calculation (min) (ACUTE ONLY): 28 min  Charges:  $Gait Training: 23-37 mins                     Gladys Damme, PT, DPT  Acute Rehabilitation Services  Pager: [phone] Office: [phone]    Lehman Prom 11/14/2017, 1:49 PM

## 2017-11-28 ENCOUNTER — Other Ambulatory Visit: Payer: Self-pay

## 2017-11-28 NOTE — Patient Outreach (Signed)
[facility]) Care Management  11/28/2017  Cheryl [name] 05-20-1934 [id number]     EMMI-STROKE RED ON EMMI ALERT Day # 13 Date: 11/27/17 Red Alert Reason: "Feeling wore overall? Yes  New problems walking/talking/speaking/seeing? Yes"    Outreach attempt # 1 to patient. No answer at present. RN CM left HIPAA compliant voicemail message along with contact info.          Plan: RN CM will make outreach attempt to patient within 3-4 business days. RN CM will send unsuccessful outreach letter to patient.   Antionette Fairy, RN,BSN,CCM [facility] Care Management Telephonic Care Management Coordinator Direct Phone: [phone] Toll Free: [phone] Fax: [phone]

## 2017-11-29 ENCOUNTER — Other Ambulatory Visit: Payer: Self-pay

## 2017-11-29 NOTE — Patient Outreach (Signed)
[facility]) Care Management  11/29/2017  KASIDI SHANKER 1934-06-09 [id number]     EMMI-STROKE RED ON EMMI ALERT Day # 13 Date: 11/27/17 Red Alert Reason: " feeling worse overall? Yes   New problems walking/talking/speaking/seeing? Yes"   Outreach attempt # 2 to patient. Spoke with patient. She voices that she is doing okay. Her main concern is that she remains very weak. Reviewed and addressed red alert with patient. She confirmed that she really has not felt worse but just weak. She is not having any issues talking/speaking or seeing but more so with walking related to weakness. She states that she is getting HHPT. Patient goes for her follow up MD appt on today. She already plans to discuss her weakness issues with MD. She did confirm that she has very supportive family who is assisting her as needed. She voices no issues or concerns regarding transportation or medications. Patient has completed automated post discharge EMMI-Stroke calls. Patient verbalizes no further RN CM needs or concerns at this time.        Plan: RN CM will close case at this time as no further interventions needed.   Antionette Fairy, RN,BSN,CCM [facility] Care Management Telephonic Care Management Coordinator Direct Phone: [phone] Toll Free: [phone] Fax: [phone]

## 2017-11-30 ENCOUNTER — Other Ambulatory Visit (HOSPITAL_COMMUNITY): Payer: Self-pay | Admitting: Interventional Radiology

## 2017-11-30 DIAGNOSIS — R9389 Abnormal findings on diagnostic imaging of other specified body structures: Secondary | ICD-10-CM

## 2017-11-30 DIAGNOSIS — I771 Stricture of artery: Secondary | ICD-10-CM

## 2017-12-05 ENCOUNTER — Ambulatory Visit (HOSPITAL_COMMUNITY)
Admission: RE | Admit: 2017-12-05 | Discharge: 2017-12-05 | Disposition: A | Discharge status: Home / Self Care | Payer: Medicare Other | Source: Ambulatory Visit | Attending: Interventional Radiology | Admitting: Interventional Radiology

## 2017-12-07 ENCOUNTER — Ambulatory Visit (HOSPITAL_COMMUNITY)
Admission: RE | Admit: 2017-12-07 | Discharge: 2017-12-07 | Disposition: A | Discharge status: Home / Self Care | Payer: Medicare Other | Source: Ambulatory Visit | Attending: Interventional Radiology | Admitting: Interventional Radiology

## 2017-12-07 DIAGNOSIS — I771 Stricture of artery: Secondary | ICD-10-CM

## 2017-12-07 DIAGNOSIS — R9389 Abnormal findings on diagnostic imaging of other specified body structures: Secondary | ICD-10-CM

## 2017-12-07 NOTE — Consult Note (Signed)
Chief Complaint: Patient was seen in consultation today for abnormal brain imaging  Referring Physician(s): Dr. Marvel Plan  Supervising Physician: Julieanne Cotton  Patient Status: [facility] - Out-pt  History of Present Illness: Cheryl [name] is a 83 y.o. female with past medical history of CAD, CKD stage 3, HTN, paroxysmal a fib who presented to ED with left-sided weakness 11/10/17.  Patient had reportedly awaken at [facility] that morning with left facial droop and extremity weakness but returned to bed until 9AM when she awoke unchanged.  She was outside of window for intervention and was treated for stroke as an inpatient by Neurology service. A CTA of the Head and Neck obtained at the time revealed: 1. Negative for large vessel occlusion, but there are moderate to severe bilateral PCA P2/P3 segment stenoses worse on the left. 2. Positive also for bilateral ICA and distal vertebral artery Fibromuscular Dysplasia (FMD). Distal cervical Right ICA involvement including fusiform aneurysmal enlargement of the vessel 7-9 mm diameter. 3. Positive also for a small 2-3 mm aneurysm versus infundibulum of the distal Left Vertebral Artery directed posteriorly as it crosses the dura. See series 6, image 240. 4. Abnormal right upper lobe reticulonodular pulmonary opacity is nonspecific. Recommend dedicated Chest CT. 5. Ectatic Ascending Aorta and arch, 31-34 mm diameter. Recommend annual imaging followup by CTA or MRA. This recommendation follows 2010 ACCF/AHA/AATS/ACR/ASA/SCA/SCAI/SIR/STS/SVM Guidelines for the Diagnosis and Management of Patients with Thoracic Aortic Disease. Circulation.2010; 121: Z610-R604  She was referred to Neurointerventional service at discharge for follow-up of her abnormal imaging.  She presents to clinic today in improved health since discharge.  She is accompanied by her husband and son.  She lives at home with her husband who she provides primary care due to his  Alzheimer's.  She has been working with home PT/OT and feels she has some residual weakness but is overall near baseline.  She denies symptoms or complaints today including headache, blurry vision, dizziness, weakness, trouble speaking or swallowing, gait difficulties.  She has been using a cane for an extended period of time for occasional balance issues which are not new for her.   Past Medical History:  Diagnosis Date  . Arthritis   . CAD (coronary artery disease)    Mild per remote cath in 2004; normal nuclear in 2012  . Chronic kidney disease    stage3 kidney disease   . Complication of anesthesia    slow to wake up  . Diastolic dysfunction    Grade I, per echo in 2012  . Dysrhythmia   . GERD (gastroesophageal reflux disease)    prevacid  . H/O hiatal hernia   . Headache(784.0)    sinus  . High risk medication use    Flecainide therapy  . History of heartburn   . Hyperlipidemia   . Hypertension   . LVH (left ventricular hypertrophy)    Per echo in July 2012  . Normal nuclear stress test July 2012  . Obesity   . PAF (paroxysmal atrial fibrillation) (HCC)   . Pneumonia    20 yrs ago  . Syncope July 2012    Past Surgical History:  Procedure Laterality Date  . APPENDECTOMY  1952  . BACK SURGERY  1980, 2010   x2  . CARDIAC CATHETERIZATION  07/28/2002   EF GREATER THAN 55%; MILD CAD  . COLONOSCOPY    . DILATION AND CURETTAGE OF UTERUS    . LUMBAR LAMINECTOMY/DECOMPRESSION MICRODISCECTOMY Right 02/15/2015   Procedure: Right Thoracic twelve-Lumbar one  Microdiskectomy;  Surgeon: Tressie Stalker, MD;  Location: [facility];  Service: Neurosurgery;  Laterality: Right;  Thoracic/Lumbar  . TONSILLECTOMY     age 59 yrs  . TUBAL LIGATION      Allergies: Codeine phosphate; Lipitor [atorvastatin calcium]; and Sulfonamide derivatives  Medications: Prior to Admission medications   Medication Sig Start Date End Date Taking? Authorizing Provider  acetaminophen (TYLENOL) 500  MG tablet Take 500-1,000 mg by mouth every 6 (six) hours as needed for mild pain.    [provider]  amLODipine (NORVASC) 10 MG tablet Take 1 tablet (10 mg total) by mouth daily. 11/15/17   Shon Hale, MD  apixaban (ELIQUIS) 5 MG TABS tablet Take 1 tablet (5 mg total) by mouth 2 (two) times daily. 11/14/17   Shon Hale, MD  Biotin 10 MG CAPS Take 10 mg by mouth daily.    [provider]  Cholecalciferol (VITAMIN D) 2000 UNITS tablet Take 2,000 Units by mouth daily.      [provider]  cyclobenzaprine (FLEXERIL) 5 MG tablet Take 1 tablet (5 mg total) by mouth 3 (three) times daily as needed for muscle spasms. 11/14/17   Shon Hale, MD  docusate sodium (COLACE) 100 MG capsule Take 1 capsule (100 mg total) by mouth 2 (two) times daily. Patient taking differently: Take 100 mg by mouth 2 (two) times daily as needed for mild constipation.  02/16/15   Tressie Stalker, MD  flecainide (TAMBOCOR) 50 MG tablet Take 1 tablet (50 mg total) by mouth daily. 11/14/17   Shon Hale, MD  lisinopril (PRINIVIL,ZESTRIL) 10 MG tablet Take 1 tablet (10 mg total) by mouth daily. 11/15/17   Shon Hale, MD  loratadine (CLARITIN) 10 MG tablet Take 10 mg by mouth daily as needed for allergies.    [provider]  metoprolol succinate (TOPROL-XL) 50 MG 24 hr tablet Take 1 tablet (50 mg total) by mouth daily. 11/14/17   Shon Hale, MD  rosuvastatin (CRESTOR) 10 MG tablet Take 1 tablet (10 mg total) by mouth at bedtime. For Cholesterol/For Stroke Prevention 11/14/17 11/14/18  Shon Hale, MD  traZODone (DESYREL) 100 MG tablet Take 1 tablet (100 mg total) by mouth at bedtime as needed for sleep. 11/14/17   Shon Hale, MD     Family History  Problem Relation Age of Onset  . Lung cancer Father     Social History   Socioeconomic History  . Marital status: Married    Spouse name: Not on file  . Number of children: Not on file  . Years of education:  Not on file  . Highest education level: Not on file  Occupational History  . Not on file  Social Needs  . Financial resource strain: Not on file  . Food insecurity:    Worry: Not on file    Inability: Not on file  . Transportation needs:    Medical: Not on file    Non-medical: Not on file  Tobacco Use  . Smoking status: Never Smoker  . Smokeless tobacco: Never Used  Substance and Sexual Activity  . Alcohol use: No  . Drug use: No  . Sexual activity: Not on file  Lifestyle  . Physical activity:    Days per week: Not on file    Minutes per session: Not on file  . Stress: Not on file  Relationships  . Social connections:    Talks on phone: Not on file    Gets together: Not on file  Attends religious service: Not on file    Active member of club or organization: Not on file    Attends meetings of clubs or organizations: Not on file    Relationship status: Not on file  Other Topics Concern  . Not on file  Social History Narrative  . Not on file     Review of Systems: A 12 point ROS discussed and pertinent positives are indicated in the HPI above.  All other systems are negative.  Review of Systems  Constitutional: Negative for fatigue and fever.  HENT: Negative for tinnitus and trouble swallowing.   Respiratory: Negative for cough and shortness of breath.   Cardiovascular: Negative for chest pain.  Gastrointestinal: Negative for abdominal pain.  Musculoskeletal: Positive for gait problem (uses cane for balance PRN).  Neurological: Positive for weakness (residual L, mild). Negative for dizziness, facial asymmetry, light-headedness and headaches.  Psychiatric/Behavioral: Negative for behavioral problems and confusion.    Vital Signs: There were no vitals taken for this visit.  Physical Exam  Constitutional: She is oriented to person, place, and time. She appears well-developed. No distress.  Neck: Normal range of motion. Neck supple.  Pulmonary/Chest: Effort  normal. No respiratory distress.  Musculoskeletal: Normal range of motion.  Neurological: She is alert and oriented to person, place, and time.  No deficits noted  Skin: Skin is warm and dry. She is not diaphoretic.  Psychiatric: She has a normal mood and affect. Her behavior is normal.  Nursing note and vitals reviewed.        Imaging: Ct Angio Head W Or Wo Contrast  Result Date: 11/10/2017 CLINICAL DATA:  83 year old female code stroke. Dizziness, headache, left side weakness. EXAM: CT ANGIOGRAPHY HEAD AND NECK TECHNIQUE: Multidetector CT imaging of the head and neck was performed using the standard protocol during bolus administration of intravenous contrast. Multiplanar CT image reconstructions and MIPs were obtained to evaluate the vascular anatomy. Carotid stenosis measurements (when applicable) are obtained utilizing NASCET criteria, using the distal internal carotid diameter as the denominator. CONTRAST:  50mL ISOVUE-370 IOPAMIDOL (ISOVUE-370) INJECTION 76%, <See Chart> ISOVUE-370 IOPAMIDOL (ISOVUE-370) INJECTION 76% COMPARISON:  Head CT without contrast 1050 hours today. Brain MRI 03/04/2015. Chest CTA 03/09/2016. FINDINGS: CTA NECK Skeleton: Widespread cervical spine degeneration. Advanced left sternoclavicular joint degeneration. No acute osseous abnormality identified. Upper chest: Mosaic attenuation in both upper lungs. Superimposed reticulonodular apical interstitial opacity, worse on the right (series 5, image 28). This is new since 2018. Visible superior mediastinal lymph nodes appear stable. Other neck: Multiple hypodense thyroid nodules, likely benign. Negative for neck mass or lymphadenopathy. Aortic arch: 3 vessel arch configuration. Fusiform enlargement of the arch up to 31 millimeters diameter. Visible ascending aorta is 34 millimeters diameter. Proximal descending aorta appears to slowly taper. Mostly soft arch atherosclerotic plaque without great vessel origin stenosis. Right  carotid system: Mildly tortuous right CCA. Minimal CCA and right carotid bifurcation plaque with no stenosis. Tortuous cervical right ICA, and the vessel has a beaded appearance along with fusiform aneurysmal enlargement, 7-9 millimeters diameter at the C1 level (series 8 image 28). No stenosis. Left carotid system: Minimal left CCA and left carotid bifurcation atherosclerosis without stenosis. Tortuous left ICA distal to the bulb with a beaded appearance to the skull base (series 8, images 29 and 31). No stenosis. Vertebral arteries: No proximal right subclavian artery or right vertebral artery origin stenosis. Tortuous right V1 segment with a mildly kinked appearance. Mildly beaded appearance of the right V3 segment but no stenosis  to the skull base. No proximal left subclavian artery or left vertebral artery stenosis with minimal plaque. Patent left vertebral artery to the skull base. Mildly to moderately beaded V3 segment appearance noted. There is a small 2-3 millimeter saccular lesion directed posteriorly from the vessel as it crosses the dura on series 6, image 240. CTA HEAD Posterior circulation: Mildly beaded appearance of the bilateral V4 vertebral artery segments. The left is mildly dominant. Both PICA origins are patent. No distal vertebral stenosis. Patent vertebrobasilar junction and basilar artery without stenosis. Mild basilar tortuosity. SCA and PCA origins are normal. Posterior communicating arteries are diminutive or absent. Both P1 segments are tortuous but without stenosis. There are tandem severe stenoses of the left PCA P2 and P3 segments seen on series 9, image 71. There is preserved distal enhancement. On the right there is moderate P2 segment stenosis with attenuated distal right PCA enhancement. Anterior circulation: Both ICA siphons are patent. On the left there is mild calcified plaque without stenosis. On the right there is mild calcified plaque without stenosis. Normal ophthalmic  artery origins. Patent carotid termini. The right A1 segment is dominant, the left is diminutive. ACA origins, anterior communicating artery, and bilateral ACA branches are within normal limits. Left MCA M1 segment, bifurcation, and left MCA branches are within normal limits. Right MCA M1 segment is patent without stenosis. Right MCA bifurcation is patent. No right MCA branch occlusion or high-grade stenosis is identified. Venous sinuses: Patent. Anatomic variants: Mildly dominant left vertebral artery. Review of the MIP images confirms the above findings IMPRESSION: 1. Negative for large vessel occlusion, but there are moderate to severe bilateral PCA P2/P3 segment stenoses worse on the left. 2. Positive also for bilateral ICA and distal vertebral artery Fibromuscular Dysplasia (FMD). Distal cervical Right ICA involvement including fusiform aneurysmal enlargement of the vessel 7-9 mm diameter. 3. Positive also for a small 2-3 mm aneurysm versus infundibulum of the distal Left Vertebral Artery directed posteriorly as it crosses the dura. See series 6, image 240. 4. Abnormal right upper lobe reticulonodular pulmonary opacity is nonspecific. Recommend dedicated Chest CT. 5. Ectatic Ascending Aorta and arch, 31-34 mm diameter. Recommend annual imaging followup by CTA or MRA. This recommendation follows 2010 ACCF/AHA/AATS/ACR/ASA/SCA/SCAI/SIR/STS/SVM Guidelines for the Diagnosis and Management of Patients with Thoracic Aortic Disease. Circulation.2010; 121: Z610-R604 Electronically Signed   By: Odessa Fleming M.D.   On: 11/10/2017 11:30   Ct Head Wo Contrast  Result Date: 11/10/2017 CLINICAL DATA:  Follow-up code stroke from earlier today. Left-sided weakness and ataxia. Now with seizure-like activity. EXAM: CT HEAD WITHOUT CONTRAST TECHNIQUE: Contiguous axial images were obtained from the base of the skull through the vertex without intravenous contrast. COMPARISON:  Head CT 11/10/2017 at 1050 hours FINDINGS: Brain: There  is increased conspicuity of focal low-density in the right lentiform nucleus likely extending to the body of the right caudate nucleus consistent with an acute lateral lenticulostriate territory infarct. No intracranial hemorrhage, mass, midline shift, or extra-axial fluid collection is identified. The ventricles and sulci are normal. Vascular: Calcified atherosclerosis at the skull base. No hyperdense vessel. Skull: No fracture or focal osseous lesion. Sinuses/Orbits: Increased left sphenoid sinus fluid. Clear mastoid air cells. Bilateral cataract extraction. Other: None. IMPRESSION: Acute nonhemorrhagic right basal ganglia infarct. Electronically Signed   By: Sebastian Ache M.D.   On: 11/10/2017 20:53   Ct Angio Neck W Or Wo Contrast  Result Date: 11/10/2017 CLINICAL DATA:  83 year old female code stroke. Dizziness, headache, left side weakness. EXAM:  CT ANGIOGRAPHY HEAD AND NECK TECHNIQUE: Multidetector CT imaging of the head and neck was performed using the standard protocol during bolus administration of intravenous contrast. Multiplanar CT image reconstructions and MIPs were obtained to evaluate the vascular anatomy. Carotid stenosis measurements (when applicable) are obtained utilizing NASCET criteria, using the distal internal carotid diameter as the denominator. CONTRAST:  50mL ISOVUE-370 IOPAMIDOL (ISOVUE-370) INJECTION 76%, <See Chart> ISOVUE-370 IOPAMIDOL (ISOVUE-370) INJECTION 76% COMPARISON:  Head CT without contrast 1050 hours today. Brain MRI 03/04/2015. Chest CTA 03/09/2016. FINDINGS: CTA NECK Skeleton: Widespread cervical spine degeneration. Advanced left sternoclavicular joint degeneration. No acute osseous abnormality identified. Upper chest: Mosaic attenuation in both upper lungs. Superimposed reticulonodular apical interstitial opacity, worse on the right (series 5, image 28). This is new since 2018. Visible superior mediastinal lymph nodes appear stable. Other neck: Multiple hypodense  thyroid nodules, likely benign. Negative for neck mass or lymphadenopathy. Aortic arch: 3 vessel arch configuration. Fusiform enlargement of the arch up to 31 millimeters diameter. Visible ascending aorta is 34 millimeters diameter. Proximal descending aorta appears to slowly taper. Mostly soft arch atherosclerotic plaque without great vessel origin stenosis. Right carotid system: Mildly tortuous right CCA. Minimal CCA and right carotid bifurcation plaque with no stenosis. Tortuous cervical right ICA, and the vessel has a beaded appearance along with fusiform aneurysmal enlargement, 7-9 millimeters diameter at the C1 level (series 8 image 28). No stenosis. Left carotid system: Minimal left CCA and left carotid bifurcation atherosclerosis without stenosis. Tortuous left ICA distal to the bulb with a beaded appearance to the skull base (series 8, images 29 and 31). No stenosis. Vertebral arteries: No proximal right subclavian artery or right vertebral artery origin stenosis. Tortuous right V1 segment with a mildly kinked appearance. Mildly beaded appearance of the right V3 segment but no stenosis to the skull base. No proximal left subclavian artery or left vertebral artery stenosis with minimal plaque. Patent left vertebral artery to the skull base. Mildly to moderately beaded V3 segment appearance noted. There is a small 2-3 millimeter saccular lesion directed posteriorly from the vessel as it crosses the dura on series 6, image 240. CTA HEAD Posterior circulation: Mildly beaded appearance of the bilateral V4 vertebral artery segments. The left is mildly dominant. Both PICA origins are patent. No distal vertebral stenosis. Patent vertebrobasilar junction and basilar artery without stenosis. Mild basilar tortuosity. SCA and PCA origins are normal. Posterior communicating arteries are diminutive or absent. Both P1 segments are tortuous but without stenosis. There are tandem severe stenoses of the left PCA P2 and P3  segments seen on series 9, image 71. There is preserved distal enhancement. On the right there is moderate P2 segment stenosis with attenuated distal right PCA enhancement. Anterior circulation: Both ICA siphons are patent. On the left there is mild calcified plaque without stenosis. On the right there is mild calcified plaque without stenosis. Normal ophthalmic artery origins. Patent carotid termini. The right A1 segment is dominant, the left is diminutive. ACA origins, anterior communicating artery, and bilateral ACA branches are within normal limits. Left MCA M1 segment, bifurcation, and left MCA branches are within normal limits. Right MCA M1 segment is patent without stenosis. Right MCA bifurcation is patent. No right MCA branch occlusion or high-grade stenosis is identified. Venous sinuses: Patent. Anatomic variants: Mildly dominant left vertebral artery. Review of the MIP images confirms the above findings IMPRESSION: 1. Negative for large vessel occlusion, but there are moderate to severe bilateral PCA P2/P3 segment stenoses worse on the left. 2.  Positive also for bilateral ICA and distal vertebral artery Fibromuscular Dysplasia (FMD). Distal cervical Right ICA involvement including fusiform aneurysmal enlargement of the vessel 7-9 mm diameter. 3. Positive also for a small 2-3 mm aneurysm versus infundibulum of the distal Left Vertebral Artery directed posteriorly as it crosses the dura. See series 6, image 240. 4. Abnormal right upper lobe reticulonodular pulmonary opacity is nonspecific. Recommend dedicated Chest CT. 5. Ectatic Ascending Aorta and arch, 31-34 mm diameter. Recommend annual imaging followup by CTA or MRA. This recommendation follows 2010 ACCF/AHA/AATS/ACR/ASA/SCA/SCAI/SIR/STS/SVM Guidelines for the Diagnosis and Management of Patients with Thoracic Aortic Disease. Circulation.2010; 121: N829-F621 Electronically Signed   By: Odessa Fleming M.D.   On: 11/10/2017 11:30   Ct Chest Wo  Contrast  Result Date: 11/12/2017 CLINICAL DATA:  Lung nodule EXAM: CT CHEST WITHOUT CONTRAST TECHNIQUE: Multidetector CT imaging of the chest was performed following the standard protocol without IV contrast. COMPARISON:  03/09/2016 FINDINGS: Cardiovascular: Cardiomegaly without pericardial effusion. Coronary arteriosclerosis is noted along the distal left main and RCA. Ectatic atherosclerotic thoracic aorta along the ascending portion to 3.9 cm. The unopacified pulmonary artery and branches are unremarkable. Mediastinum/Nodes: Small hiatal hernia. No lymphadenopathy. Patent trachea and mainstem bronchi. Esophagus is unremarkable. No thyromegaly. Low attenuating hypodensities are noted within both lobes of the thyroid gland the largest measuring approximately 1 cm in the right thyroid lobe. Lungs/Pleura: Linear atelectasis and/or scarring in the right upper lobe, lingula and left lower lobe. No effusion or pneumothorax. No dominant mass pulmonary consolidation. Minimal biapical pleural scarring. Upper Abdomen: Probable biliary sludge and tiny calculus of the included gallbladder. The unenhanced liver, spleen, adrenal glands and kidneys are nonacute. Mild pancreatic atrophy along the tail. Musculoskeletal: No acute nor aggressive osseous lesions. Neural stimulator device is identified extending up to the midthoracic level. IMPRESSION: Cardiomegaly with coronary arteriosclerosis and aortic atherosclerosis. No acute pulmonary disease or dominant mass. Small hypodense nodules of the thyroid gland the largest 10 mm. No worrisome features are identified. Aortic Atherosclerosis (ICD10-I70.0). Electronically Signed   By: Tollie Eth M.D.   On: 11/12/2017 20:16   Ct Head Code Stroke Wo Contrast  Result Date: 11/10/2017 CLINICAL DATA:  Code stroke. 83 year old female with left side weakness. EXAM: CT HEAD WITHOUT CONTRAST TECHNIQUE: Contiguous axial images were obtained from the base of the skull through the vertex  without intravenous contrast. COMPARISON:  Head CT 02/23/2017.  Brain MRI 03/04/2015. FINDINGS: Brain: Cerebral volume is stable and normal for age. No midline shift, mass effect, or evidence of intracranial mass lesion. No ventriculomegaly. No acute intracranial hemorrhage identified. Stable and normal for age gray-white matter differentiation throughout the brain. No cortically based acute infarct identified. No cortical encephalomalacia identified. Vascular: Calcified atherosclerosis at the skull base. The major intracranial vascular structures appear stable. No suspicious intracranial vascular hyperdensity. Skull: Stable and negative. Sinuses/Orbits: Stable and generally well pneumatized paranasal sinuses. Chronic left sphenoid mucosal thickening. Tympanic cavities and mastoids are clear. Other: Stable and negative orbit and scalp soft tissues. ASPECTS ([place]berta Stroke Program Early CT Score) - Ganglionic level infarction (caudate, lentiform nuclei, internal capsule, insula, M1-M3 cortex): 7 - Supraganglionic infarction (M4-M6 cortex): 3 Total score (0-10 with 10 being normal): 10 IMPRESSION: 1. Stable and normal for age noncontrast CT appearance of the brain. 2. ASPECTS is 10. 3. These results were communicated to Dr. Amada Jupiter at 11:14 amon 10/5/2019by text page via the [facility] messaging system. Electronically Signed   By: Odessa Fleming M.D.   On: 11/10/2017 11:15  Labs:  CBC: Recent Labs    02/24/17 0359 02/25/17 0702 11/10/17 1036 11/10/17 1040 11/13/17 0307  WBC 11.1* 10.2 4.6  --  8.0  HGB 13.1 12.5 12.7 12.9 13.3  HCT 37.3 36.0 37.6 38.0 39.8  PLT 258 229 221  --  252    COAGS: Recent Labs    11/10/17 1036  INR 0.90  APTT 25    BMP: Recent Labs    11/11/17 0914 11/12/17 0522 11/13/17 0307 11/14/17 0451  NA 135 134* 136 133*  K 3.9 3.4* 4.3 4.4  CL 100 101 102 102  CO2 23 25 25 25   GLUCOSE 128* 92 85 99  BUN 22 23 30* 35*  CALCIUM 10.0 9.6 10.0 9.6  CREATININE 1.21*  1.09* 1.27* 1.35*  GFRNONAA 41* 46* 38* 36*  GFRAA 47* 54* 45* 41*    LIVER FUNCTION TESTS: Recent Labs    11/10/17 1036  BILITOT 0.6  AST 22  ALT 13  ALKPHOS 60  PROT 6.1*  ALBUMIN 3.8    TUMOR MARKERS: No results for input(s): AFPTM, CEA, CA199, CHROMGRNA in the last 8760 hours.  Assessment and Plan: Abnormal CTA results Patient presents to Neurointerventional service today after recent stroke.  At the time of work-up for stroke, she was noted to have potential abnormalities by CTA Head and Neck.  Imaging reviewed in detail with patient, husband, and son.  CTA incompletely describes possible abnormalities. Patient cannot undergo an MRI due to implant.  If she would like further work-up and possible intervention if problematic vascular is identified, she will first need to undergo a diagnostic angiogram.  Patient would like to discuss with family prior to deciding which is reasonable given her age, lack of symptoms, and kidney disease.  All questions of patient and son are answered to their satisfaction.  They will discuss with patient's other children and call if they would like to arrange an appointment.  Patient provided with contact information for schedulers.   Thank you for this interesting consult.  I greatly enjoyed meeting Cheryl [name] and look forward to participating in their care.  A copy of this report was sent to the requesting provider on this date.  Electronically Signed: Hoyt Koch, PA 12/07/2017, 4:34 PM   I spent a total of  30 Minutes   in face to face in clinical consultation, greater than 50% of which was counseling/coordinating care for abnormal brain imaging.

## 2017-12-17 ENCOUNTER — Encounter: Payer: Self-pay | Admitting: Adult Health

## 2017-12-17 ENCOUNTER — Ambulatory Visit: Payer: Medicare Other | Admitting: Adult Health

## 2017-12-17 VITALS — BP 134/77 | HR 71 | Ht 64.0 in | Wt 141.8 lb

## 2017-12-17 DIAGNOSIS — I10 Essential (primary) hypertension: Secondary | ICD-10-CM

## 2017-12-17 DIAGNOSIS — I63411 Cerebral infarction due to embolism of right middle cerebral artery: Secondary | ICD-10-CM | POA: Diagnosis not present

## 2017-12-17 DIAGNOSIS — E785 Hyperlipidemia, unspecified: Secondary | ICD-10-CM

## 2017-12-17 DIAGNOSIS — I480 Paroxysmal atrial fibrillation: Secondary | ICD-10-CM | POA: Diagnosis not present

## 2017-12-17 NOTE — Progress Notes (Signed)
[facility] [address] [place] [place] [address] [phone]       OFFICE FOLLOW UP NOTE  Ms. Cheryl [name] Date of Birth:  08-14-1934 Medical Record Number:  [id number]   Reason for Referral:  hospital stroke follow up  CHIEF COMPLAINT:  Chief Complaint  Patient presents with  . Follow-up    Hospital Stroke from 11/2017  follow up room 9 patient  with Beverely Low daughter and Remi Deter her husband    HPI: Cheryl [name] is being seen today for initial visit in the office for right basal ganglia infarct likely embolic secondary to AF not on [facility] on 11/11/2017. History obtained from patient, husband and daughter and chart review. Reviewed all radiology images and labs personally.  Cheryl Laible Morrisis an 83 y.o.femalePMH significant for HTN, HLD, paroxysmal a. Fib ( not anticoagulated), CAD, CKD who presented to [facility] ED as a code stroke for left facial droop and left sided weakness. Initial CT head reviewed and was negative for acute abnormality.  Repeat CT head showed acute nonhemorrhagic right basal ganglia infarct.  Unable to obtain MRI due to spinal stimulator.  CTA head and neck showed bilateral PT/P3 stenosis, bilateral ICA FMD and right distal ICA fusiform dilation.  2D echo showed an EF of 55 to 60%.  LDL 173 and as atorvastatin intolerance in the past, recommended to initiate Crestor but if unable to tolerate recommend consideration of PSKD 9 inhibitors as outpatient.  HTN elevated during admission and recommended long-term BP goal normotensive range.  Due to findings of bilateral ICA FMD and right ICA fusiform dilation, recommended to follow-up with Dr. Fatima Sanger outpatient for continued monitoring as IR unable to perform any intervention at this time.  Patient is on aspirin 81 mg daily PTA and recommended initiating Eliquis as a stroke was likely embolic from AF not on AC.  Patient was discharged home in stable condition with recommendations of home health PT.  Patient is  being seen today for hospital follow up and is accompanied by her husband and daughter.  She does continue to have left-sided weakness but this has been improving.  She does have an extensive history of back surgeries with nerve pain radiating down left leg which has been making it difficult for patient to continue therapies with left leg due to pain.  She also has increased fatigue and daughter is concerned that this is due to 1 of her blood pressure medications.  Blood pressure medication recently changed but continues to experience fatigue.  Explained to patient and daughter that her fatigue could be a combination of post stroke, blood pressure medications or other medications such as Flexeril and trazodone.  She does state that she is sleeping well at night.  She is living independently with her husband and her daughter comes over to assist with cooking and cleaning.  She has continued to take Eliquis 5 mg daily without side effects of bleeding or bruising.  Continues to take Crestor without side effects myalgias.  Blood pressure today satisfactory 134/77. Patient did follow-up with IR on 12/07/2017 and as she cannot undergo MRI was recommended to undergo cerebral angiogram if she would like further work-up with possible intervention.  Per notes, she requested to speak with her family first deciding which is reasonable plan given her age, lack of symptoms and kidney disease.  Patient continues to be unsure whether she would like to pursue further work-up and feels as though this time she may just hold off.  No  further concerns at this time.  Denies new or worsening stroke/TIA symptoms.   ROS:   14 system review of systems performed and negative with exception of fatigue, cough, easy bruising, increased thirst, joint pain, weakness, sleepiness, decreased energy, change in appetite and disinterest in activities  PMH:  Past Medical History:  Diagnosis Date  . Arthritis   . CAD (coronary artery disease)     Mild per remote cath in 2004; normal nuclear in 2012  . Chronic kidney disease    stage3 kidney disease   . Complication of anesthesia    slow to wake up  . Diastolic dysfunction    Grade I, per echo in 2012  . Dysrhythmia   . GERD (gastroesophageal reflux disease)    prevacid  . H/O hiatal hernia   . Headache(784.0)    sinus  . High risk medication use    Flecainide therapy  . History of heartburn   . Hyperlipidemia   . Hypertension   . LVH (left ventricular hypertrophy)    Per echo in July 2012  . Normal nuclear stress test July 2012  . Obesity   . PAF (paroxysmal atrial fibrillation) (HCC)   . Pneumonia    20 yrs ago  . Syncope July 2012    PSH:  Past Surgical History:  Procedure Laterality Date  . APPENDECTOMY  1952  . BACK SURGERY  1980, 2010   x2  . CARDIAC CATHETERIZATION  07/28/2002   EF GREATER THAN 55%; MILD CAD  . COLONOSCOPY    . DILATION AND CURETTAGE OF UTERUS    . LUMBAR LAMINECTOMY/DECOMPRESSION MICRODISCECTOMY Right 02/15/2015   Procedure: Right Thoracic twelve-Lumbar one  Microdiskectomy;  Surgeon: Tressie Stalker, MD;  Location: [facility];  Service: Neurosurgery;  Laterality: Right;  Thoracic/Lumbar  . TONSILLECTOMY     age 62 yrs  . TUBAL LIGATION      Social History:  Social History   Socioeconomic History  . Marital status: Married    Spouse name: Not on file  . Number of children: Not on file  . Years of education: Not on file  . Highest education level: Not on file  Occupational History  . Not on file  Social Needs  . Financial resource strain: Not on file  . Food insecurity:    Worry: Not on file    Inability: Not on file  . Transportation needs:    Medical: Not on file    Non-medical: Not on file  Tobacco Use  . Smoking status: Never Smoker  . Smokeless tobacco: Never Used  Substance and Sexual Activity  . Alcohol use: No  . Drug use: No  . Sexual activity: Not on file  Lifestyle  . Physical activity:    Days per week:  Not on file    Minutes per session: Not on file  . Stress: Not on file  Relationships  . Social connections:    Talks on phone: Not on file    Gets together: Not on file    Attends religious service: Not on file    Active member of club or organization: Not on file    Attends meetings of clubs or organizations: Not on file    Relationship status: Not on file  . Intimate partner violence:    Fear of current or ex partner: Not on file    Emotionally abused: Not on file    Physically abused: Not on file    Forced sexual activity: Not on file  Other Topics Concern  . Not on file  Social History Narrative  . Not on file    Family History:  Family History  Problem Relation Age of Onset  . Lung cancer Father   . Stroke Paternal Grandmother     Medications:   Current Outpatient Medications on File Prior to Visit  Medication Sig Dispense Refill  . acetaminophen (TYLENOL) 500 MG tablet Take 500-1,000 mg by mouth every 6 (six) hours as needed for mild pain.    Marland Kitchen amLODipine (NORVASC) 10 MG tablet Take 1 tablet (10 mg total) by mouth daily. 30 tablet 3  . apixaban (ELIQUIS) 5 MG TABS tablet Take 1 tablet (5 mg total) by mouth 2 (two) times daily. 60 tablet 3  . Biotin 10 MG CAPS Take 10 mg by mouth daily.    . Cholecalciferol (VITAMIN D) 2000 UNITS tablet Take 2,000 Units by mouth daily.      . cyclobenzaprine (FLEXERIL) 5 MG tablet Take 1 tablet (5 mg total) by mouth 3 (three) times daily as needed for muscle spasms. 30 tablet 0  . docusate sodium (COLACE) 100 MG capsule Take 1 capsule (100 mg total) by mouth 2 (two) times daily. (Patient taking differently: Take 100 mg by mouth 2 (two) times daily as needed for mild constipation. ) 60 capsule 0  . flecainide (TAMBOCOR) 50 MG tablet Take 1 tablet (50 mg total) by mouth daily. 90 tablet 3  . loratadine (CLARITIN) 10 MG tablet Take 10 mg by mouth daily as needed for allergies.    . metoprolol succinate (TOPROL-XL) 50 MG 24 hr tablet Take 1  tablet (50 mg total) by mouth daily. 30 tablet 5  . olmesartan (BENICAR) 20 MG tablet Take 20 mg by mouth daily.    . rosuvastatin (CRESTOR) 10 MG tablet Take 1 tablet (10 mg total) by mouth at bedtime. For Cholesterol/For Stroke Prevention 30 tablet 5  . tiZANidine (ZANAFLEX) 2 MG tablet     . traZODone (DESYREL) 100 MG tablet Take 1 tablet (100 mg total) by mouth at bedtime as needed for sleep. 30 tablet 2   No current facility-administered medications on file prior to visit.     Allergies:   Allergies  Allergen Reactions  . Codeine Phosphate     headache  . Lipitor [Atorvastatin Calcium]     Muscle pain and tiredness  . Sulfonamide Derivatives     REACTION: unspecified     Physical Exam  Vitals:   12/17/17 1237  BP: 134/77  Pulse: 71  Weight: 141 lb 12.8 oz (64.3 kg)  Height: 5\' 4"  (1.626 m)   Body mass index is 24.34 kg/m. No exam data present  General: well developed, well nourished, pleasant elderly Caucasian female, seated, in no evident distress Head: head normocephalic and atraumatic.   Neck: supple with no carotid or supraclavicular bruits Cardiovascular: regular rate and rhythm, no murmurs Musculoskeletal: no deformity Skin:  no rash/petichiae Vascular:  Normal pulses all extremities  Neurologic Exam Mental Status: Awake and fully alert. Oriented to place and time. Recent and remote memory intact. Attention span, concentration and fund of knowledge appropriate. Mood and affect appropriate.  Cranial Nerves: Fundoscopic exam reveals sharp disc margins. Pupils equal, briskly reactive to light. Extraocular movements full without nystagmus. Visual fields full to confrontation. Hearing intact. Facial sensation intact. Face, tongue, palate moves normally and symmetrically.  Motor: Normal bulk and tone. Normal strength in all tested extremity muscles except for left hand grip weakness.  Unable to adequately  assess left hip flexor strength due to lower back pain.  Full  strength in distal left lower extremity Sensory.: intact to touch , pinprick , position and vibratory sensation.  Coordination: Rapid alternating movements normal in all extremities. Finger-to-nose and heel-to-shin performed accurately bilaterally.  Orbits right arm over left arm.  Decreased left hand finger dexterity. Gait and Station: Arises from chair without difficulty. Stance is hunched. Gait demonstrates normal stride length and balance with use of cane and favoring of left leg. Reflexes: 1+ and symmetric. Toes downgoing.    NIHSS  0 Modified Rankin  2 HAS-BLED 2 CHA2DS2-VASc 6   Diagnostic Data (Labs, Imaging, Testing)   Ct Angio Head W Or Wo Contrast Ct Angio Neck W Or Wo Contrast 11/10/2017 IMPRESSION:  1. Negative for large vessel occlusion, but there are moderate to severe bilateral PCA P2/P3 segment stenoses worse on the left.  2. Positive also for bilateral ICA and distal vertebral artery Fibromuscular Dysplasia (FMD). Distal cervical Right ICA involvement including fusiform aneurysmal enlargement of the vessel 7-9 mm diameter.  3. Positive also for a small 2-3 mm aneurysm versus infundibulum of the distal Left Vertebral Artery directed posteriorly as it crosses the dura. See series 6, image 240.  4. Abnormal right upper lobe reticulonodular pulmonary opacity is nonspecific. Recommend dedicated Chest CT.  5. Ectatic Ascending Aorta and arch, 31-34 mm diameter. Recommend annual imaging followup by CTA or MRA. This recommendation follows 2010 ACCF/AHA/AATS/ACR/ASA/SCA/SCAI/SIR/STS/SVM Guidelines for the Diagnosis and Management of Patients with Thoracic Aortic Disease. Circulation.2010; 121: e266-e369   Ct Head Wo Contrast 11/10/2017 IMPRESSION:  Acute nonhemorrhagic right basal ganglia infarct.   Ct Head Code Stroke Wo Contrast 11/10/2017 IMPRESSION:  1. Stable and normal for age noncontrast CT appearance of the brain.  2. ASPECTS is 10.   Transthoracic  Echocardiogram 11/12/2017 Impressions: - Normal LV size with moderate LV hypertrophy. EF 55-60%. Normal RV   size and systolic function. No significant valvular   abnormalities.     ASSESSMENT: Cheryl [name] is a 83 y.o. year old female here with embolic right basal ganglia infarct on 11/11/2017 secondary to AF not on AC. Vascular risk factors include uncontrolled HTN, HLD, PAF, CAD and CKD.  Patient is being seen today for hospital follow-up and does continue to have left hemiparesis but overall has been improving.    PLAN: -Continue Eliquis (apixaban) daily  and Crestor for secondary stroke prevention -F/u with PCP regarding your HLD and HTN management -f/u with cardiology as scheduled for continued atrial fibrillation and now Eliquis management -Advised her that her fatigue level should improve over time and if it does not, to speak to PCP as they can potentially start switching medications to see if this could be the cause -Highly encouraged to continue home exercises for continued deficits -Advised to monitor BP at home with recordings -advised to continue to stay active and maintain a healthy diet -Maintain strict control of hypertension with blood pressure goal below 130/90, diabetes with hemoglobin A1c goal below 6.5% and cholesterol with LDL cholesterol (bad cholesterol) goal below 70 mg/dL. I also advised the patient to eat a healthy diet with plenty of whole grains, cereals, fruits and vegetables, exercise regularly and maintain ideal body weight.  Follow up in 3 months or call earlier if needed   Greater than 50% of time during this 25 minute visit was spent on counseling,explanation of diagnosis of right basal ganglia infarct, reviewing risk factor management of PAF, HLD, HTN, CAD and CKD, planning  of further management, discussion with patient and family and coordination of care    George Hugh, [facility]  [facility] [address] [place], [place] [address]  Phone [phone] Fax [phone] Note: This document was prepared with digital dictation and possible smart phrase technology. Any transcriptional errors that result from this process are unintentional.

## 2017-12-17 NOTE — Patient Instructions (Signed)
Continue Eliquis (apixaban) daily  and Crestor  for secondary stroke prevention  Continue to follow up with PCP regarding cholesterol and blood pressure management along with follow up if you continue to feel fatigued during the day  Follow up with cardiology as scheduled for atrial fibrillation and eliquis management   Continue to do home exercises for continued weakness  Continue to monitor blood pressure at home  Maintain strict control of hypertension with blood pressure goal below 130/90, diabetes with hemoglobin A1c goal below 6.5% and cholesterol with LDL cholesterol (bad cholesterol) goal below 70 mg/dL. I also advised the patient to eat a healthy diet with plenty of whole grains, cereals, fruits and vegetables, exercise regularly and maintain ideal body weight.  Followup in the future with me in 3 months or call earlier if needed       Thank you for coming to see [place] at [facility]. I hope we have been able to provide you high quality care today.  You may receive a patient satisfaction survey over the next few weeks. We would appreciate your feedback and comments so that we may continue to improve ourselves and the health of our patients.

## 2017-12-22 NOTE — Progress Notes (Signed)
I agree with the above plan 

## 2018-03-19 ENCOUNTER — Other Ambulatory Visit: Payer: Self-pay | Admitting: Family Medicine

## 2018-03-19 ENCOUNTER — Ambulatory Visit: Payer: Medicare Other | Admitting: Adult Health

## 2018-03-19 DIAGNOSIS — Z1231 Encounter for screening mammogram for malignant neoplasm of breast: Secondary | ICD-10-CM

## 2018-03-19 DIAGNOSIS — M8580 Other specified disorders of bone density and structure, unspecified site: Secondary | ICD-10-CM

## 2018-03-22 ENCOUNTER — Telehealth (INDEPENDENT_AMBULATORY_CARE_PROVIDER_SITE_OTHER): Payer: Self-pay | Admitting: *Deleted

## 2018-03-26 NOTE — Telephone Encounter (Signed)
F/up here, see if re-program is appropriate etc.

## 2018-04-17 IMAGING — MR MR LUMBAR SPINE W/O CM
4 of 6 series · 25 of 48 positions shown · non-contrast
Comparison: Prior MRI from 03/19/2015.

CLINICAL DATA: Initial evaluation for 6 week history of left lower
back pain with left sciatica.

EXAM:
MRI LUMBAR SPINE WITHOUT CONTRAST
TECHNIQUE: Multiplanar, multisequence MR imaging of the lumbar spine was
performed. No intravenous contrast was administered.

[Series 2: T2 · sagittal · 4.0mm · 0.73mm/px · 5 of 20 slices shown (1 of 3)]
[im 1/20]
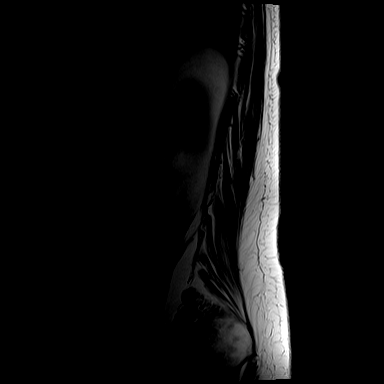
[im 5/20]
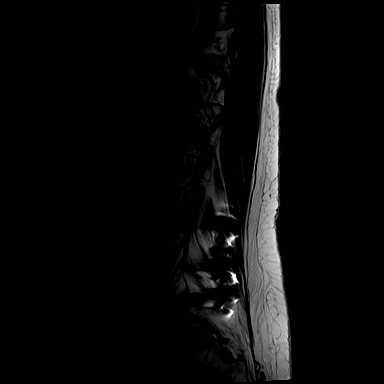
[im 10/20]
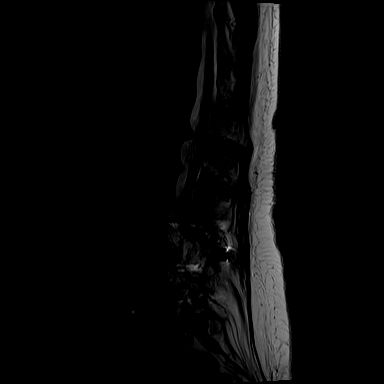
[im 15/20]
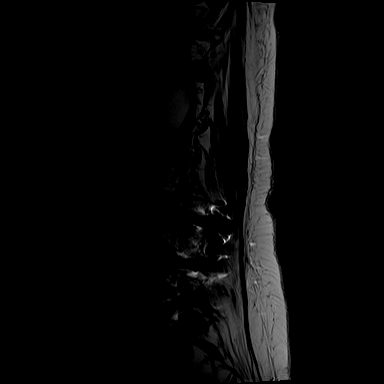
[im 20/20]
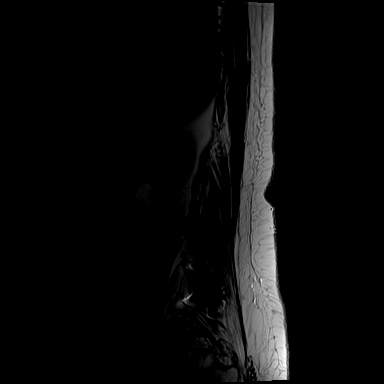

[Series 4: T1 · sagittal · 4.0mm · 0.88mm/px · 5 of 16 slices shown]
[im 1/16]
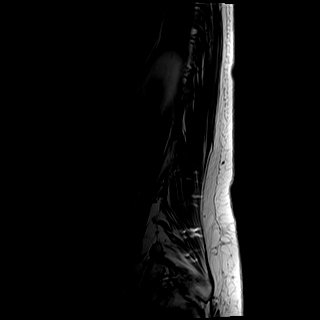
[im 4/16]
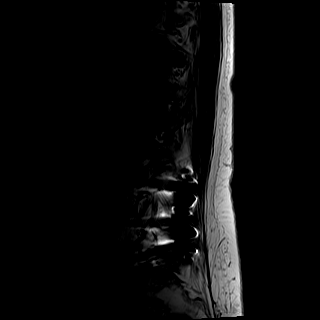
[im 8/16]
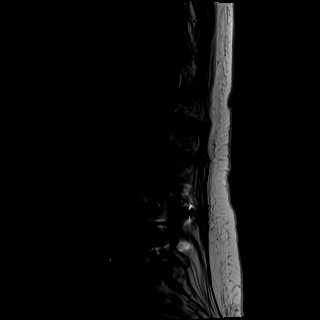
[im 12/16]
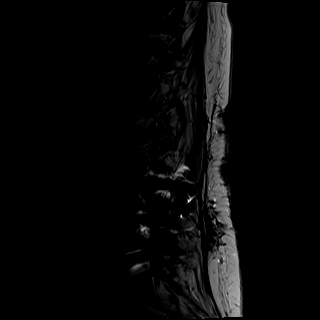
[im 16/16]
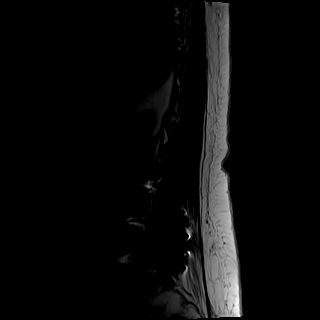

[Series 5: T2 · axial · 4.0mm · 0.39mm/px · z∈[-138,+83]mm · 9 of 42 slices shown (2 of 3)]
[im 1/42]
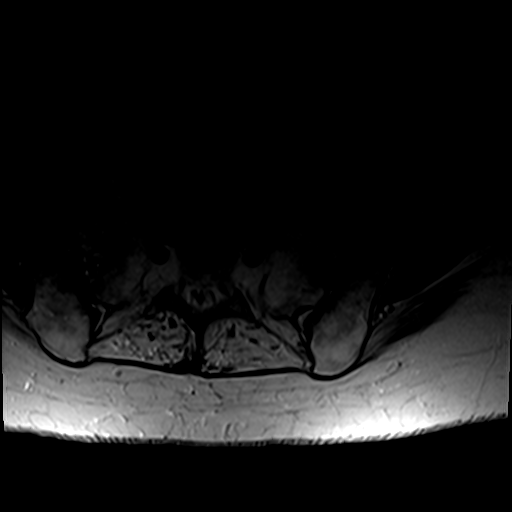
[im 7/42]
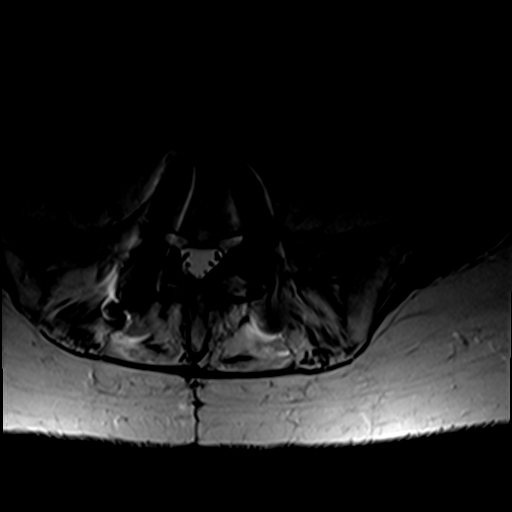
[im 14/42]
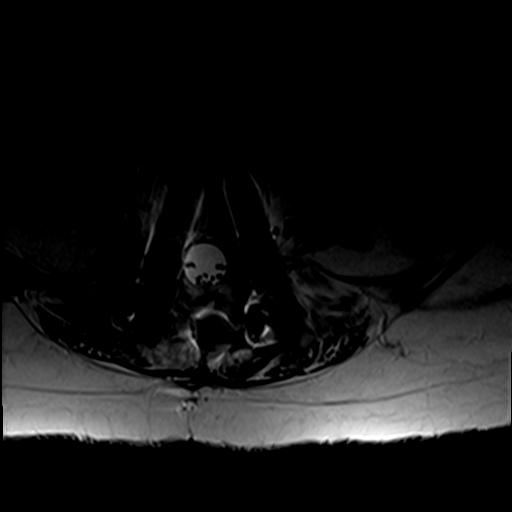
[im 18/42]
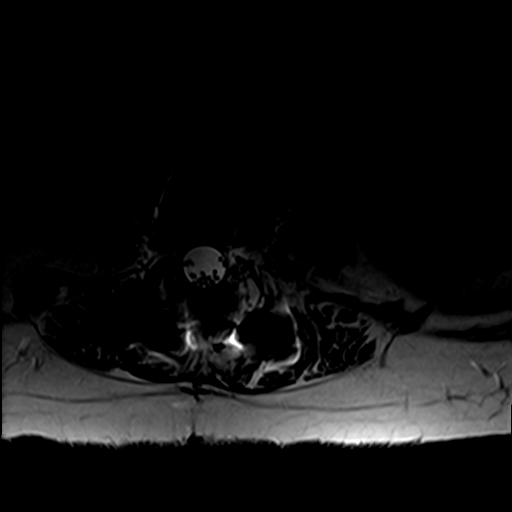
[im 21/42]
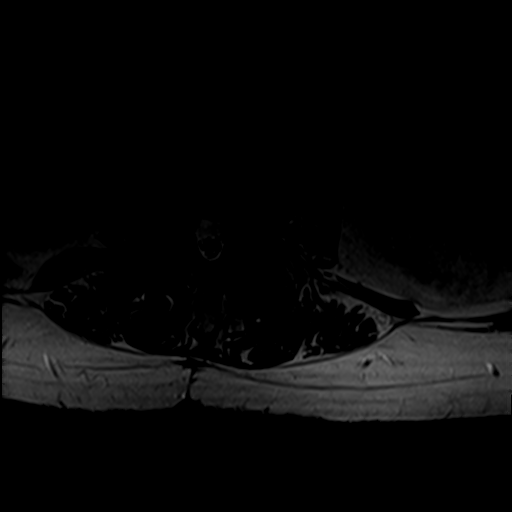
[im 24/42]
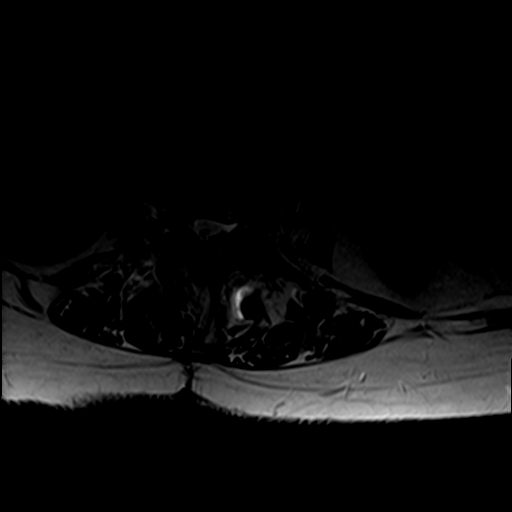
[im 28/42]
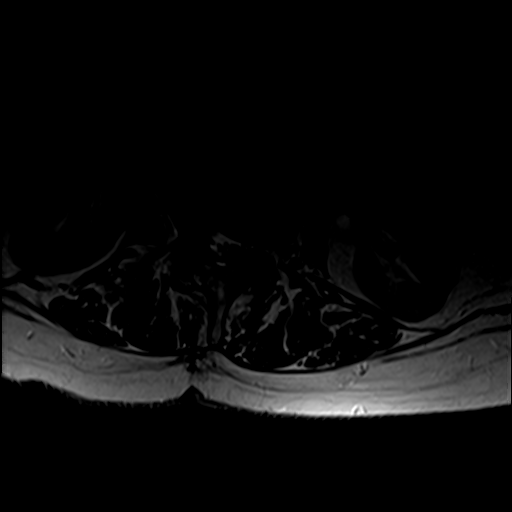
[im 35/42]
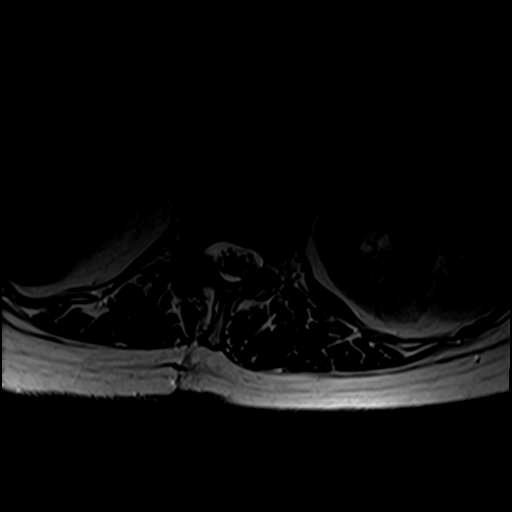
[im 42/42]
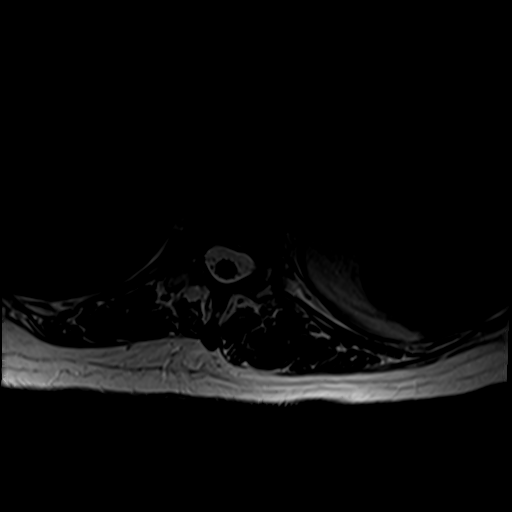

[Series 7: T2 · coronal · 4.0mm · 0.73mm/px · 6 of 20 slices shown (3 of 3)]
[im 1/20]
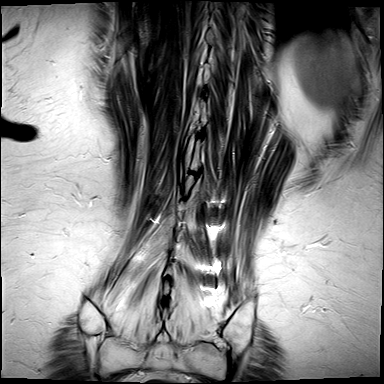
[im 4/20]
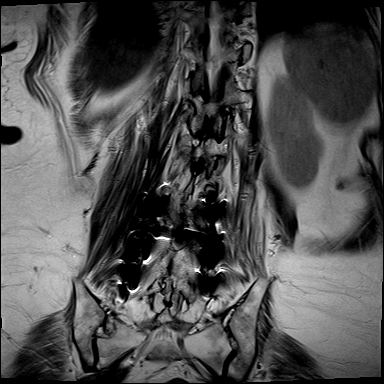
[im 8/20]
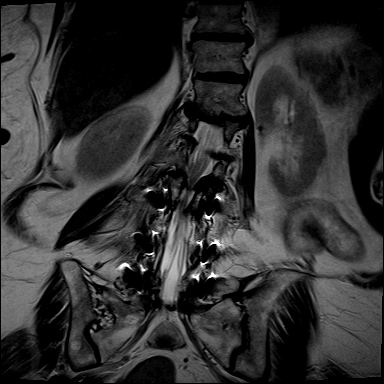
[im 12/20]
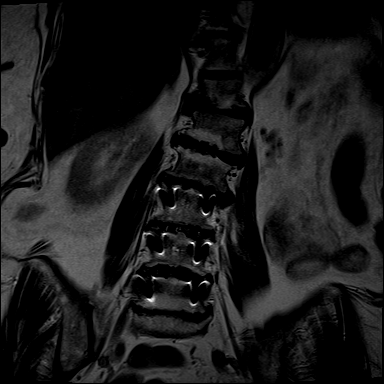
[im 16/20]
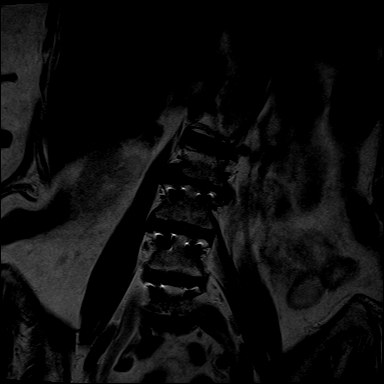
[im 20/20]
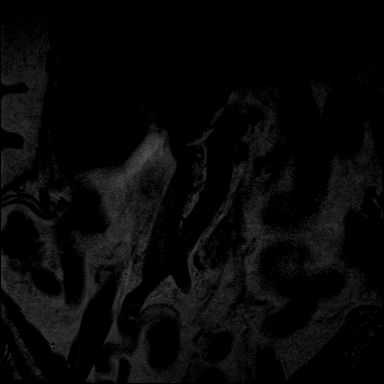

[25 of 48 positions shown; findings below may reference images not displayed]

FINDINGS: Segmentation: Normal segmentation. Lowest well-formed disc labeled
the L5-S1 level. Same numbering system employed as on previous
exams.

Alignment: Stable levoscoliosis. Unchanged 3 mm retrolisthesis of
T12 on L1, with 3 mm anterolisthesis of L3 on L4.

Vertebrae: Vertebral body heights are maintained without evidence
for acute or interval fracture. Susceptibility artifact from prior
fusion at L3-4 through L5-S1. Multilevel reactive endplate changes
seen throughout the lumbar spine, most notable at the T12-L1, L1-2,
and L5-S1 levels. Underlying bone marrow signal intensity within
normal limits. Subcentimeter benign hemangioma noted within the T11
vertebral body. No worrisome osseous lesions.

Conus medullaris and cauda equina: Conus extends to the L1-2 level.
Conus and cauda equina appear normal.

Paraspinal and other soft tissues: Paraspinous soft tissues
demonstrate no acute abnormality. Visualized visceral structures
within normal limits.

Disc levels:

T12-L1: 3 mm retrolisthesis. Progressive intervertebral disc space
narrowing with diffuse disc bulge with chronic reactive endplate
changes and resultant disc osteophyte slightly eccentric to the
right. Moderate facet and ligament flavum hypertrophy. Moderate to
advanced right foraminal stenosis is relatively similar to previous.
Slightly progressive moderate spinal stenosis with lateral recess
narrowing.

L1-2: Diffuse disc bulge with disc desiccation and intervertebral
disc space narrowing. Reactive endplate changes with marginal
endplate osteophytic spurring. Bilateral facet hypertrophy, greater
on the left. Probable remote postsurgical changes at this level.
Stable mild canal with moderate left lateral recess narrowing. Mild
bilateral foraminal stenosis, right greater than left, also stable.

L2-3: Diffuse disc bulge with disc desiccation and intervertebral
disc space narrowing, progressed from previous. Mild reactive
marginal endplate spurring. Moderate facet and ligamentum flavum
hypertrophy. Mild canal and bilateral foraminal stenosis relatively
similar.

L3-4: Postoperative changes from remote PLIF. No residual or
recurrent canal or foraminal stenosis.

L4-5: Remote postoperative changes from previous PLIF. No residual
or recurrent canal or foraminal stenosis.

L5-S1: Chronic intervertebral disc space narrowing with diffuse disc
bulge and disc desiccation. Reactive endplate changes with marginal
endplate osteophytic spurring. Remote right-sided laminotomy defect.
Moderate facet hypertrophy. Resultant moderate lateral recess
narrowing bilaterally, similar to previous. Moderate to severe left
foraminal stenosis appears progressed from previous, with possible
L5 nerve root impingement. Similar moderate right L5 foraminal
narrowing.
IMPRESSION: 1. Multifactorial degenerative changes at L5-S1 with resultant
moderate to severe left L5 foraminal stenosis, slightly progressed
from previous.
2. Progressive degenerative spondylolysis at T12-L1 with slightly
worsened moderate spinal stenosis with similar moderate to advanced
right foraminal narrowing.
3. Stable chronic degenerative disc disease and facet hypertrophy at
L1-2 and L2-3 with resultant lateral recess and foraminal narrowing
as above.

## 2018-05-07 ENCOUNTER — Ambulatory Visit (INDEPENDENT_AMBULATORY_CARE_PROVIDER_SITE_OTHER): Payer: Medicare Other | Admitting: Adult Health

## 2018-05-07 ENCOUNTER — Encounter: Payer: Self-pay | Admitting: Adult Health

## 2018-05-07 ENCOUNTER — Other Ambulatory Visit: Payer: Self-pay

## 2018-05-07 DIAGNOSIS — I63411 Cerebral infarction due to embolism of right middle cerebral artery: Secondary | ICD-10-CM

## 2018-05-07 DIAGNOSIS — E785 Hyperlipidemia, unspecified: Secondary | ICD-10-CM

## 2018-05-07 DIAGNOSIS — I480 Paroxysmal atrial fibrillation: Secondary | ICD-10-CM

## 2018-05-07 DIAGNOSIS — I10 Essential (primary) hypertension: Secondary | ICD-10-CM | POA: Diagnosis not present

## 2018-05-07 NOTE — Progress Notes (Signed)
I agree with the above plan 

## 2018-05-07 NOTE — Patient Instructions (Signed)
Continue Eliquis (apixaban) daily  and Crestor for secondary stroke prevention  Continue to follow up with PCP regarding cholesterol and blood pressure management   Obtain lab work to your PCP at follow-up visit in 07/2018 and if cholesterol levels remain high, highly recommend initiating injectable cholesterol medication which can be discussed with your PCP  Continue to follow with your cardiologist for atrial fibrillation and Eliquis management  Continue to stay active and maintain a healthy diet  Continue to monitor blood pressure at home  Maintain strict control of hypertension with blood pressure goal below 130/90, diabetes with hemoglobin A1c goal below 6.5% and cholesterol with LDL cholesterol (bad cholesterol) goal below 70 mg/dL. I also advised the patient to eat a healthy diet with plenty of whole grains, cereals, fruits and vegetables, exercise regularly and maintain ideal body weight.         Thank you for coming to see [place] at [facility]. I hope we have been able to provide you high quality care today.  You may receive a patient satisfaction survey over the next few weeks. We would appreciate your feedback and comments so that we may continue to improve ourselves and the health of our patients.

## 2018-05-07 NOTE — Progress Notes (Signed)
[facility] [address] [place]. [place] [address] [phone]       OFFICE FOLLOW UP NOTE  Ms. Cheryl [name] Date of Birth:  04-11-1934 Medical Record Number:  [id number]   Reason for Referral:  hospital stroke follow up   [facility] [address] [place]. [place] [address] [phone]     Virtual Visit via Telephone Note  I connected with Cheryl [name] on 05/07/18 at 12:45 PM EDT by telephone  where I am located at home and verified that I am speaking with the correct person using two identifiers who reports being located in her own home.    I discussed the limitations, risks, security and privacy concerns of performing an evaluation and management service by telephone and the availability of in person appointments. I also discussed with the patient that there may be a patient responsible charge related to this service. The patient expressed understanding and agreed to proceed.     CHIEF COMPLAINT:  Chief Complaint  Patient presents with   Follow-up    STROKE FOLLOW UP    History of Present Illness:  Cheryl [name] is a 84 y.o. female who is followed in this office after right basal ganglia infarct in 11/2017.  She was initially scheduled for face-to-face office visit today at this time but due to COVID19, face-to-face office visit rescheduled for non-face-to-face telephone visit.   She reports since prior visit she has been doing well with mild residual left hand dexterity weakness and only reports difficulty with writing as she is left-hand dominant but she is able to maintain all other function and activity along with maintaining ADLs and IADLs independently.  She currently lives at home with her husband who has Alzheimer's and she is his main caregiver.  She continues on Eliquis without side effects of bleeding or bruising for atrial fibrillation and secondary stroke prevention.  She continues on Crestor but was having difficulty  tolerating due to myalgias and fatigue therefore was recommended by her PCP to cut 10 mg tablet in half and take twice weekly and slowly increase dosage to see if she could better tolerate.  She does report improvement of fatigue and muscle pain since decreasing dose.  She is currently taking 5 mg 3 times weekly.  She plans on following up with PCP in 07/2018 for repeat lab work.  She is unsure if she had cholesterol levels checked at prior visit in 03/2018.  She does continue to monitor blood pressure at home and typical levels 130s/70s.  No further concerns at this time and she feels very satisfied with the progress that she has made regarding her stroke and her overall health.    INITIAL VISIT 11/19 Copied from prior note for reference purposes  Cheryl [name] is being seen today for initial visit in the office for right basal ganglia infarct likely embolic secondary to AF not on AC on 11/11/2017. History obtained from patient, husband and daughter and chart review. Reviewed all radiology images and labs personally.  Cheryl Brandt Morrisis an 84 y.o.femalePMH significant for HTN, HLD, paroxysmal a. Fib ( not anticoagulated), CAD, CKD who presented to [facility] ED as a code stroke for left facial droop and left sided weakness. Initial CT head reviewed and was negative for acute abnormality.  Repeat CT head showed acute nonhemorrhagic right basal ganglia infarct.  Unable to obtain MRI due to spinal stimulator.  CTA head and neck showed bilateral PT/P3 stenosis, bilateral ICA FMD and  right distal ICA fusiform dilation.  2D echo showed an EF of 55 to 60%.  LDL 173 and as atorvastatin intolerance in the past, recommended to initiate Crestor but if unable to tolerate recommend consideration of PSKD 9 inhibitors as outpatient.  HTN elevated during admission and recommended long-term BP goal normotensive range.  Due to findings of bilateral ICA FMD and right ICA fusiform dilation, recommended to follow-up with Dr. Fatima Sanger  outpatient for continued monitoring as IR unable to perform any intervention at this time.  Patient is on aspirin 81 mg daily PTA and recommended initiating Eliquis as a stroke was likely embolic from AF not on AC.  Patient was discharged home in stable condition with recommendations of home health PT.  Patient is being seen today for hospital follow up and is accompanied by her husband and daughter.  She does continue to have left-sided weakness but this has been improving.  She does have an extensive history of back surgeries with nerve pain radiating down left leg which has been making it difficult for patient to continue therapies with left leg due to pain.  She also has increased fatigue and daughter is concerned that this is due to 1 of her blood pressure medications.  Blood pressure medication recently changed but continues to experience fatigue.  Explained to patient and daughter that her fatigue could be a combination of post stroke, blood pressure medications or other medications such as Flexeril and trazodone.  She does state that she is sleeping well at night.  She is living independently with her husband and her daughter comes over to assist with cooking and cleaning.  She has continued to take Eliquis 5 mg daily without side effects of bleeding or bruising.  Continues to take Crestor without side effects myalgias.  Blood pressure today satisfactory 134/77. Patient did follow-up with IR on 12/07/2017 and as she cannot undergo MRI was recommended to undergo cerebral angiogram if she would like further work-up with possible intervention.  Per notes, she requested to speak with her family first deciding which is reasonable plan given her age, lack of symptoms and kidney disease.  Patient continues to be unsure whether she would like to pursue further work-up and feels as though this time she may just hold off.  No further concerns at this time.  Denies new or worsening stroke/TIA symptoms.    PMH:    Past Medical History:  Diagnosis Date   Arthritis    CAD (coronary artery disease)    Mild per remote cath in 2004; normal nuclear in 2012   Chronic kidney disease    stage3 kidney disease    Complication of anesthesia    slow to wake up   Diastolic dysfunction    Grade I, per echo in 2012   Dysrhythmia    GERD (gastroesophageal reflux disease)    prevacid   H/O hiatal hernia    Headache(784.0)    sinus   High risk medication use    Flecainide therapy   History of heartburn    Hyperlipidemia    Hypertension    LVH (left ventricular hypertrophy)    Per echo in July 2012   Normal nuclear stress test July 2012   Obesity    PAF (paroxysmal atrial fibrillation) (HCC)    Pneumonia    20 yrs ago   Syncope July 2012    PSH:  Past Surgical History:  Procedure Laterality Date   APPENDECTOMY  1952   BACK SURGERY  1980,  2010   x2   CARDIAC CATHETERIZATION  07/28/2002   EF GREATER THAN 55%; MILD CAD   COLONOSCOPY     DILATION AND CURETTAGE OF UTERUS     LUMBAR LAMINECTOMY/DECOMPRESSION MICRODISCECTOMY Right 02/15/2015   Procedure: Right Thoracic twelve-Lumbar one  Microdiskectomy;  Surgeon: Tressie Stalker, MD;  Location: [facility];  Service: Neurosurgery;  Laterality: Right;  Thoracic/Lumbar   TONSILLECTOMY     age 54 yrs   TUBAL LIGATION      Social History:  Social History   Socioeconomic History   Marital status: Married    Spouse name: Not on file   Number of children: Not on file   Years of education: Not on file   Highest education level: Not on file  Occupational History   Not on file  Social Needs   Financial resource strain: Not on file   Food insecurity:    Worry: Not on file    Inability: Not on file   Transportation needs:    Medical: Not on file    Non-medical: Not on file  Tobacco Use   Smoking status: Never Smoker   Smokeless tobacco: Never Used  Substance and Sexual Activity   Alcohol use: No   Drug  use: No   Sexual activity: Not on file  Lifestyle   Physical activity:    Days per week: Not on file    Minutes per session: Not on file   Stress: Not on file  Relationships   Social connections:    Talks on phone: Not on file    Gets together: Not on file    Attends religious service: Not on file    Active member of club or organization: Not on file    Attends meetings of clubs or organizations: Not on file    Relationship status: Not on file   Intimate partner violence:    Fear of current or ex partner: Not on file    Emotionally abused: Not on file    Physically abused: Not on file    Forced sexual activity: Not on file  Other Topics Concern   Not on file  Social History Narrative   Not on file    Family History:  Family History  Problem Relation Age of Onset   Lung cancer Father    Stroke Paternal Grandmother     Medications:   Current Outpatient Medications on File Prior to Visit  Medication Sig Dispense Refill   acetaminophen (TYLENOL) 500 MG tablet Take 500-1,000 mg by mouth every 6 (six) hours as needed for mild pain.     amLODipine (NORVASC) 10 MG tablet Take 1 tablet (10 mg total) by mouth daily. 30 tablet 3   apixaban (ELIQUIS) 5 MG TABS tablet Take 1 tablet (5 mg total) by mouth 2 (two) times daily. 60 tablet 3   Biotin 10 MG CAPS Take 10 mg by mouth daily.     Cholecalciferol (VITAMIN D) 2000 UNITS tablet Take 2,000 Units by mouth daily.       cyclobenzaprine (FLEXERIL) 5 MG tablet Take 1 tablet (5 mg total) by mouth 3 (three) times daily as needed for muscle spasms. 30 tablet 0   docusate sodium (COLACE) 100 MG capsule Take 1 capsule (100 mg total) by mouth 2 (two) times daily. (Patient taking differently: Take 100 mg by mouth 2 (two) times daily as needed for mild constipation. ) 60 capsule 0   flecainide (TAMBOCOR) 50 MG tablet Take 1 tablet (50 mg  total) by mouth daily. 90 tablet 3   loratadine (CLARITIN) 10 MG tablet Take 10 mg by mouth  daily as needed for allergies.     metoprolol succinate (TOPROL-XL) 50 MG 24 hr tablet Take 1 tablet (50 mg total) by mouth daily. 30 tablet 5   olmesartan (BENICAR) 20 MG tablet Take 20 mg by mouth daily.     rosuvastatin (CRESTOR) 10 MG tablet Take 1 tablet (10 mg total) by mouth at bedtime. For Cholesterol/For Stroke Prevention 30 tablet 5   tiZANidine (ZANAFLEX) 2 MG tablet      traZODone (DESYREL) 100 MG tablet Take 1 tablet (100 mg total) by mouth at bedtime as needed for sleep. 30 tablet 2   No current facility-administered medications on file prior to visit.     Allergies:   Allergies  Allergen Reactions   Codeine Phosphate     headache   Lipitor [Atorvastatin Calcium]     Muscle pain and tiredness   Sulfonamide Derivatives     REACTION: unspecified     Physical Exam  General: Pleasant elderly Caucasian female asking and answering questions appropriately throughout conversation  Neurologic Exam Mental Status: Awake and fully alert. Oriented to place and time. Recent and remote memory intact. Attention span, concentration and fund of knowledge appropriate. Mood and affect appropriate.   Exam limited due to visit type including full assessment and vital signs    ASSESSMENT: Cheryl [name] is a 84 y.o. year old female here with embolic right basal ganglia infarct on 11/11/2017 secondary to AF not on AC. Vascular risk factors include uncontrolled HTN, HLD, PAF, CAD and CKD.  She reports overall she is doing well from a stroke standpoint with mild residual left hand dexterity weakness but otherwise denies new or worsening stroke/TIA symptoms.    PLAN: -Continue Eliquis (apixaban) daily  and Crestor for secondary stroke prevention -F/u with PCP regarding your HLD and HTN management -spoke to patient in regards to potentially needing to initiate PSC K9 inhibitor such as Repatha for HDL management due to statin intolerance.  She plans on following up with PCP in 07/2020  obtained lipid panel and advised her that if levels remain elevated, PSC K9 should be discussed and verbalized understanding -f/u with cardiology as scheduled for continued atrial fibrillation and now Eliquis management -Highly encouraged to continue home exercises for continued deficits but patient declines need of ongoing exercises for mild left hand weakness as it only affects her writing and she has compensated for this difficulty such as automatic bill paying and texting friends or sending emails -Advised to monitor BP at home with recordings -advised to continue to stay active and maintain a healthy diet -Maintain strict control of hypertension with blood pressure goal below 130/90, diabetes with hemoglobin A1c goal below 6.5% and cholesterol with LDL cholesterol (bad cholesterol) goal below 70 mg/dL. I also advised the patient to eat a healthy diet with plenty of whole grains, cereals, fruits and vegetables, exercise regularly and maintain ideal body weight.  As she has been stable from a stroke standpoint, she can follow-up in this office as needed but advised to call with any questions or concerns in the future.  Patient verbalized understanding and in agreement to this plan.    I discussed the assessment and treatment plan with the patient.  The patient was provided an opportunity to ask questions and all were answered to their satisfaction. The patient agreed with the plan and verbalized an understanding of the instructions.  I provided 24 minutes of non-face-to-face time during this encounter.    George Hugh, AGNP-BC  [facility] [address] [place], [place] [address]  Phone [phone] Fax [phone] Note: This document was prepared with digital dictation and possible smart phrase technology. Any transcriptional errors that result from this process are unintentional.

## 2018-05-24 NOTE — Telephone Encounter (Signed)
Called pt to f/u she states she did not call [employer] and states she does not want to take further action at this time.

## 2018-06-04 ENCOUNTER — Other Ambulatory Visit: Payer: Medicare Other

## 2018-06-04 ENCOUNTER — Ambulatory Visit: Payer: Medicare Other

## 2018-08-01 ENCOUNTER — Other Ambulatory Visit: Payer: Self-pay

## 2018-08-01 ENCOUNTER — Ambulatory Visit
Admission: RE | Admit: 2018-08-01 | Discharge: 2018-08-01 | Disposition: A | Discharge status: Home / Self Care | Payer: Medicare Other | Source: Ambulatory Visit | Attending: Family Medicine | Admitting: Family Medicine

## 2018-08-01 DIAGNOSIS — Z1231 Encounter for screening mammogram for malignant neoplasm of breast: Secondary | ICD-10-CM

## 2018-08-01 DIAGNOSIS — M8580 Other specified disorders of bone density and structure, unspecified site: Secondary | ICD-10-CM

## 2018-10-09 ENCOUNTER — Other Ambulatory Visit: Payer: Self-pay

## 2018-10-09 ENCOUNTER — Encounter: Payer: Self-pay | Admitting: Cardiology

## 2018-10-09 ENCOUNTER — Ambulatory Visit (INDEPENDENT_AMBULATORY_CARE_PROVIDER_SITE_OTHER): Payer: Medicare Other | Admitting: Cardiology

## 2018-10-09 VITALS — BP 160/80 | HR 50 | Ht 64.0 in | Wt 134.6 lb

## 2018-10-09 DIAGNOSIS — N183 Chronic kidney disease, stage 3 unspecified: Secondary | ICD-10-CM

## 2018-10-09 DIAGNOSIS — I10 Essential (primary) hypertension: Secondary | ICD-10-CM | POA: Diagnosis not present

## 2018-10-09 DIAGNOSIS — I480 Paroxysmal atrial fibrillation: Secondary | ICD-10-CM | POA: Diagnosis not present

## 2018-10-09 MED ORDER — METOPROLOL TARTRATE 100 MG PO TABS: 100.0000 mg | ORAL_TABLET | Freq: Two times a day (BID) | ORAL | 1 refills | Status: DC

## 2018-10-09 NOTE — Patient Instructions (Addendum)
Medication Instructions:   METOPROLOL SUCCINATE (TOPROL XL) WAS REMOVED FROM YOUR MEDICATION LIST.  PLEASE CONTINUE TO TAKE METOPROLOL TARTRATE (LOPRESSOR) 100 MG BY MOUTH TWICE DAILY  If you need a refill on your cardiac medications before your next appointment, please call your pharmacy.      Follow-Up: At [facility], you and your health needs are our priority.  As part of our continuing mission to provide you with exceptional heart care, we have created designated Provider Care Teams.  These Care Teams include your primary Cardiologist (physician) and Advanced Practice Providers (APPs -  Physician Assistants and Nurse Practitioners) who all work together to provide you with the care you need, when you need it. You will need a follow up appointment in 12 months with Dr. Marlou Porch.  Please call our office 2 months in advance to schedule this appointment.  You may see Candee Furbish, MD or one of the following Advanced Practice Providers on your designated Care Team:   Truitt Merle, NP Cecilie Kicks, NP . Kathyrn Drown, NP

## 2018-10-09 NOTE — Progress Notes (Signed)
[address] [place], [place]  [address] Phone: [phone] Fax:  [phone]  Date:  10/09/2018   ID:  Cheryl [name], DOB July 23, 1934, MRN [id number]  PCP:  Cheryl [name], Cynthia, MD   History of Present Illness: Cheryl [name] is a 84 y.o. female (pronounced Lie na) with atrial fibrillation currently on antiarrhythmic flecainide here for followup. She had not currently been on anticoagulation despite extensive counseling on risk of stroke. She refused.  Likely familial hyperlipidemia, LDL 211 in September of 2013. Does not wish to take statin medication.  Nor does she want to take chronic anticoagulation. Lengthy discussion.  back surgery in July 2015, fusion. No afib.  She will occasionally take an extra flecainide to help her with racing heartbeat. This seems to work well for her. Continue.  Her husband has been diagnosed with Alzheimer's.  03/01/17-reviewed her recent hospitalization January 2019 where sodium was 122, now 131.  Off HCTZ.  No chest pain fevers chills nausea vomiting shortness of breath.  10/09/18 - no new issues, her for PAF follow up. No palpitations no syncope bleeding orthopnea PND.  Because of hypertension, her metoprolol was increased to 100 mg twice a day.  She does have subsequent sinus bradycardia with heart rate of 50.  Continue to monitor.  Wt Readings from Last 3 Encounters:  10/09/18 134 lb 9.6 oz (61.1 kg)  12/17/17 141 lb 12.8 oz (64.3 kg)  11/10/17 144 lb 6.4 oz (65.5 kg)     Past Medical History:  Diagnosis Date  . Arthritis   . CAD (coronary artery disease)    Mild per remote cath in 2004; normal nuclear in 2012  . Chronic kidney disease    stage3 kidney disease   . Complication of anesthesia    slow to wake up  . Diastolic dysfunction    Grade I, per echo in 2012  . Dysrhythmia   . GERD (gastroesophageal reflux disease)    prevacid  . H/O hiatal hernia   . Headache(784.0)    sinus  . High risk medication use    Flecainide therapy  . History of heartburn   . Hyperlipidemia   . Hypertension   . LVH (left ventricular hypertrophy)    Per echo in July 2012  . Normal nuclear stress test July 2012  . Obesity   . PAF (paroxysmal atrial fibrillation) (HCC)   . Pneumonia    20 yrs ago  . Syncope July 2012    Past Surgical History:  Procedure Laterality Date  . APPENDECTOMY  1952  . BACK SURGERY  1980, 2010   x2  . CARDIAC CATHETERIZATION  07/28/2002   EF GREATER THAN 55%; MILD CAD  . COLONOSCOPY    . DILATION AND CURETTAGE OF UTERUS    . LUMBAR LAMINECTOMY/DECOMPRESSION MICRODISCECTOMY Right 02/15/2015   Procedure: Right Thoracic twelve-Lumbar one  Microdiskectomy;  Surgeon: Tressie StalkerJeffrey Jenkins, MD;  Location: [facility];  Service: Neurosurgery;  Laterality: Right;  Thoracic/Lumbar  . TONSILLECTOMY     age 51 yrs  . TUBAL LIGATION      Current Outpatient Medications  Medication Sig Dispense Refill  . apixaban (ELIQUIS) 5 MG TABS tablet Take 1 tablet (5 mg total) by mouth 2 (two) times daily. 60 tablet 3  . Biotin 10 MG CAPS Take 10 mg by mouth daily.    . Cholecalciferol (VITAMIN D) 2000 UNITS tablet Take 2,000 Units by mouth daily.      . cyclobenzaprine (  FLEXERIL) 5 MG tablet Take 1 tablet (5 mg total) by mouth 3 (three) times daily as needed for muscle spasms. 30 tablet 0  . docusate sodium (COLACE) 100 MG capsule Take 1 capsule (100 mg total) by mouth 2 (two) times daily. 60 capsule 0  . flecainide (TAMBOCOR) 50 MG tablet Take 1 tablet (50 mg total) by mouth daily. 90 tablet 3  . loratadine (CLARITIN) 10 MG tablet Take 10 mg by mouth daily as needed for allergies.    . Magnesium 200 MG TABS Take 200 mg by mouth daily.    . metoprolol tartrate (LOPRESSOR) 100 MG tablet Take 1 tablet (100 mg total) by mouth 2 (two) times daily. 180 tablet 1  . rosuvastatin (CRESTOR) 10 MG tablet Take 1 tablet (10 mg total) by mouth at bedtime. For Cholesterol/For Stroke Prevention 30 tablet 5  . traZODone  (DESYREL) 100 MG tablet Take 1 tablet (100 mg total) by mouth at bedtime as needed for sleep. 30 tablet 2   No current facility-administered medications for this visit.     Allergies:    Allergies  Allergen Reactions  . Codeine Phosphate     headache  . Lipitor [Atorvastatin Calcium]     Muscle pain and tiredness  . Sulfonamide Derivatives     REACTION: unspecified    Social History:  The patient  reports that she has never smoked. She has never used smokeless tobacco. She reports that she does not drink alcohol or use drugs.   ROS:  Please see the history of present illness.  Denies any fevers chills nausea vomiting syncope bleeding all other ROS negative PHYSICAL EXAM: VS:  BP (!) 160/80   Pulse (!) 50   Ht 5\' 4"  (1.626 m)   Wt 134 lb 9.6 oz (61.1 kg)   SpO2 95%   BMI 23.10 kg/m  GEN: Thin well nourished, well developed, in no acute distress  HEENT: normal  Neck: no JVD, carotid bruits, or masses Cardiac: RRR; no murmurs, rubs, or gallops,no edema  Respiratory:  clear to auscultation bilaterally, normal work of breathing GI: soft, nontender, nondistended, + BS MS: no deformity or atrophy  Skin: warm and dry, no rash Neuro:  Alert and Oriented x 3, Strength and sensation are intact Psych: euthymic mood, full affect   EKG:  EKG ordered 10/09/2018- sinus bradycardia 50 left anterior fascicular block LVH pattern personally reviewed 03/01/17-sinus rhythm 61 with left anterior fascicular block and nonspecific ST-T wave changes.  02/17/16-sinus rhythm, PACs. Left anterior fascicular block. Sinus rhythm left anterior fascicular block, PAC. No evidence of atrial fibrillation.     Echocardiogram 08/30/2010  - Left ventricle: The cavity size was normal. Wall thickness was increased in a pattern of moderate LVH. Systolic function was normal. The estimated ejection fraction was in the range of 55% to 65%. Wall motion was normal; there were no regional wall motion abnormalities.  Doppler parameters are consistent with abnormal left ventricular relaxation (grade 1 diastolic dysfunction). - Mitral valve: There was systolic anterior motion. Mild regurgitation. - Atrial septum: No defect or patent foramen ovale was identified. - Pulmonary arteries: PA peak pressure: 54mm Hg (S).  ASSESSMENT AND PLAN:  1. Paroxysmal atrial fibrillation-previously expressed the importance of anticoagulation. She has been informed about stroke risk. CHADS-VAS -3. She states that she worked so hard to get off of warfarin previously. I explained the benefits of the NOAC. She clearly refuses to take anticoagulation. Continue with flecainide for now (unconventional dose once a day). Likes  to take it more in the night. Did not do well with twice a day.  Now taking metoprolol 100 mg twice a day.  This was increased because of blood pressure.  Her heart rate currently is 50.  I told her that if she ended up with significant bradycardia she may need to pull back on this medication.  Unfortunately, other medicine such as ARB, amlodipine did not work well with her. 2. Hypertension-can not take hydralazine, ARB, amlodipine.  Metoprolol was increased to 100 twice a day watch for any significant bradycardia.  Encouraged low sodium diet. Exercise. 3. Hyperlipidemia-currently taking  Crestor 10 three times a week. Doing ok. LDL 109. Had mild stroke. LDL cholesterol was 211 on 09/30/12. Crestor 10 three times a week. Doing ok. LDL 109. Had mild stroke.  4. Chronic kidney disease stage III-GFR in the 50 range. Likely secondary to hypertension. She was quite concerned about this. Discussed the importance of controlling blood pressure. Exercise. Avoiding NSAIDs. 5. Pulmonary hypertension-seen on echocardiogram, 62 mmHg.  CT scan of chest showed no evidence of thromboembolic disease.  She is not short of breath currently.  Continue to treat underlying hypertension.  Seems to be stable 6. We will see back in 1 year.   Signed, Candee Furbish, MD [facility]  10/09/2018 2:43 PM

## 2018-11-18 ENCOUNTER — Other Ambulatory Visit: Payer: Self-pay | Admitting: Family Medicine

## 2018-11-18 DIAGNOSIS — I77810 Thoracic aortic ectasia: Secondary | ICD-10-CM

## 2018-11-26 ENCOUNTER — Ambulatory Visit
Admission: RE | Admit: 2018-11-26 | Discharge: 2018-11-26 | Disposition: A | Discharge status: Home / Self Care | Payer: Medicare Other | Source: Ambulatory Visit | Attending: Family Medicine | Admitting: Family Medicine

## 2018-11-26 DIAGNOSIS — I77810 Thoracic aortic ectasia: Secondary | ICD-10-CM

## 2018-11-26 MED ORDER — IOPAMIDOL (ISOVUE-370) INJECTION 76%
60.0000 mL | Freq: Once | INTRAVENOUS | Status: AC | PRN
  Administered 2018-11-26: 60 mL via INTRAVENOUS

## 2018-12-01 ENCOUNTER — Other Ambulatory Visit: Payer: Self-pay | Admitting: Cardiology

## 2019-03-25 ENCOUNTER — Other Ambulatory Visit: Payer: Self-pay | Admitting: Family Medicine

## 2019-03-25 DIAGNOSIS — R0989 Other specified symptoms and signs involving the circulatory and respiratory systems: Secondary | ICD-10-CM

## 2019-04-08 ENCOUNTER — Ambulatory Visit
Admission: RE | Admit: 2019-04-08 | Discharge: 2019-04-08 | Disposition: A | Discharge status: Home / Self Care | Payer: Medicare PPO | Source: Ambulatory Visit | Attending: Family Medicine | Admitting: Family Medicine

## 2019-04-08 DIAGNOSIS — R0989 Other specified symptoms and signs involving the circulatory and respiratory systems: Secondary | ICD-10-CM

## 2019-08-21 DIAGNOSIS — K219 Gastro-esophageal reflux disease without esophagitis: Secondary | ICD-10-CM | POA: Diagnosis not present

## 2019-08-21 DIAGNOSIS — M48061 Spinal stenosis, lumbar region without neurogenic claudication: Secondary | ICD-10-CM | POA: Diagnosis not present

## 2019-08-21 DIAGNOSIS — M129 Arthropathy, unspecified: Secondary | ICD-10-CM | POA: Diagnosis not present

## 2019-08-21 DIAGNOSIS — M5136 Other intervertebral disc degeneration, lumbar region: Secondary | ICD-10-CM | POA: Diagnosis not present

## 2019-08-21 DIAGNOSIS — Z20822 Contact with and (suspected) exposure to covid-19: Secondary | ICD-10-CM | POA: Diagnosis not present

## 2019-08-21 DIAGNOSIS — M792 Neuralgia and neuritis, unspecified: Secondary | ICD-10-CM | POA: Diagnosis not present

## 2019-08-21 DIAGNOSIS — Z79899 Other long term (current) drug therapy: Secondary | ICD-10-CM | POA: Diagnosis not present

## 2019-08-21 DIAGNOSIS — M25551 Pain in right hip: Secondary | ICD-10-CM | POA: Diagnosis not present

## 2019-08-21 DIAGNOSIS — E559 Vitamin D deficiency, unspecified: Secondary | ICD-10-CM | POA: Diagnosis not present

## 2019-08-21 DIAGNOSIS — Z1159 Encounter for screening for other viral diseases: Secondary | ICD-10-CM | POA: Diagnosis not present

## 2019-08-21 DIAGNOSIS — Z9181 History of falling: Secondary | ICD-10-CM | POA: Diagnosis not present

## 2019-08-22 DIAGNOSIS — M1990 Unspecified osteoarthritis, unspecified site: Secondary | ICD-10-CM | POA: Diagnosis not present

## 2019-08-22 DIAGNOSIS — Z882 Allergy status to sulfonamides status: Secondary | ICD-10-CM | POA: Diagnosis not present

## 2019-08-22 DIAGNOSIS — E785 Hyperlipidemia, unspecified: Secondary | ICD-10-CM | POA: Diagnosis not present

## 2019-08-22 DIAGNOSIS — Z7983 Long term (current) use of bisphosphonates: Secondary | ICD-10-CM | POA: Diagnosis not present

## 2019-08-22 DIAGNOSIS — Z809 Family history of malignant neoplasm, unspecified: Secondary | ICD-10-CM | POA: Diagnosis not present

## 2019-08-22 DIAGNOSIS — I4891 Unspecified atrial fibrillation: Secondary | ICD-10-CM | POA: Diagnosis not present

## 2019-08-22 DIAGNOSIS — G4700 Insomnia, unspecified: Secondary | ICD-10-CM | POA: Diagnosis not present

## 2019-08-22 DIAGNOSIS — K219 Gastro-esophageal reflux disease without esophagitis: Secondary | ICD-10-CM | POA: Diagnosis not present

## 2019-08-22 DIAGNOSIS — I739 Peripheral vascular disease, unspecified: Secondary | ICD-10-CM | POA: Diagnosis not present

## 2019-08-22 DIAGNOSIS — D6869 Other thrombophilia: Secondary | ICD-10-CM | POA: Diagnosis not present

## 2019-08-22 DIAGNOSIS — Z8249 Family history of ischemic heart disease and other diseases of the circulatory system: Secondary | ICD-10-CM | POA: Diagnosis not present

## 2019-08-22 DIAGNOSIS — I10 Essential (primary) hypertension: Secondary | ICD-10-CM | POA: Diagnosis not present

## 2019-08-22 DIAGNOSIS — G8929 Other chronic pain: Secondary | ICD-10-CM | POA: Diagnosis not present

## 2019-08-22 DIAGNOSIS — Z8673 Personal history of transient ischemic attack (TIA), and cerebral infarction without residual deficits: Secondary | ICD-10-CM | POA: Diagnosis not present

## 2019-08-30 ENCOUNTER — Other Ambulatory Visit: Payer: Self-pay | Admitting: Cardiology

## 2019-09-18 DIAGNOSIS — E785 Hyperlipidemia, unspecified: Secondary | ICD-10-CM | POA: Diagnosis not present

## 2019-09-18 DIAGNOSIS — I480 Paroxysmal atrial fibrillation: Secondary | ICD-10-CM | POA: Diagnosis not present

## 2019-09-18 DIAGNOSIS — N183 Chronic kidney disease, stage 3 unspecified: Secondary | ICD-10-CM | POA: Diagnosis not present

## 2019-09-18 DIAGNOSIS — G4700 Insomnia, unspecified: Secondary | ICD-10-CM | POA: Diagnosis not present

## 2019-09-18 DIAGNOSIS — D6869 Other thrombophilia: Secondary | ICD-10-CM | POA: Diagnosis not present

## 2019-09-18 DIAGNOSIS — I69352 Hemiplegia and hemiparesis following cerebral infarction affecting left dominant side: Secondary | ICD-10-CM | POA: Diagnosis not present

## 2019-09-18 DIAGNOSIS — I129 Hypertensive chronic kidney disease with stage 1 through stage 4 chronic kidney disease, or unspecified chronic kidney disease: Secondary | ICD-10-CM | POA: Diagnosis not present

## 2019-11-25 ENCOUNTER — Ambulatory Visit (INDEPENDENT_AMBULATORY_CARE_PROVIDER_SITE_OTHER): Payer: Medicare PPO

## 2019-11-25 ENCOUNTER — Encounter: Payer: Self-pay | Admitting: Orthopaedic Surgery

## 2019-11-25 ENCOUNTER — Ambulatory Visit: Payer: Medicare PPO | Admitting: Orthopaedic Surgery

## 2019-11-25 ENCOUNTER — Other Ambulatory Visit: Payer: Self-pay

## 2019-11-25 VITALS — Ht 63.0 in | Wt 135.0 lb

## 2019-11-25 DIAGNOSIS — M25561 Pain in right knee: Secondary | ICD-10-CM

## 2019-11-25 DIAGNOSIS — M11261 Other chondrocalcinosis, right knee: Secondary | ICD-10-CM | POA: Insufficient documentation

## 2019-11-25 DIAGNOSIS — M1711 Unilateral primary osteoarthritis, right knee: Secondary | ICD-10-CM | POA: Diagnosis not present

## 2019-11-25 MED ORDER — LIDOCAINE HCL 1 % IJ SOLN
2.0000 mL | INTRAMUSCULAR | Status: AC | PRN
  Administered 2019-11-25: 2 mL

## 2019-11-25 MED ORDER — BUPIVACAINE HCL 0.5 % IJ SOLN
2.0000 mL | INTRAMUSCULAR | Status: AC | PRN
  Administered 2019-11-25: 2 mL via INTRA_ARTICULAR

## 2019-11-25 NOTE — Progress Notes (Signed)
Office Visit Note   Patient: Cheryl [name]           Date of Birth: 1934/11/30           MRN: [id number] Visit Date: 11/25/2019              Requested by: Laurann Montana, MD [phone] [address] [place],  [place] [address] PCP: Laurann Montana, MD   Assessment & Plan: Visit Diagnoses:  1. Acute pain of right knee   2. Osteoarthritis of right knee, unspecified osteoarthritis type   3. Chondrocalcinosis due to dicalcium phosphate crystals, of the knee, right     Plan: #1: At this time we are going to try corticosteroid injection to the right  Knee and this was accomplished atraumatically.  She did have relief in her knee after injection. #2: We will see how she does with this and will return if she does not have benefit from this injection. #3: We did have a long discussion about the knee certainly may in the future if her symptoms are not improved could possibly be that of a total knee replacement however at her age this may not be a benefit to her.  Follow-Up Instructions: Return if symptoms worsen or fail to improve.   Orders:  Orders Placed This Encounter  Procedures  . Large Joint Inj  . XR KNEE 3 VIEW RIGHT   No orders of the defined types were placed in this encounter.     Procedures: Large Joint Inj: R knee on 11/25/2019 5:38 PM Indications: pain and diagnostic evaluation Details: 25 G 1.5 in needle, anteromedial approach  Arthrogram: No  Medications: 2 mL lidocaine 1 %; 2 mL bupivacaine 0.5 % Outcome: tolerated well, no immediate complications  2 mL of betamethasone Procedure, treatment alternatives, risks and benefits explained, specific risks discussed. Consent was given by the patient. Immediately prior to procedure a time out was called to verify the correct patient, procedure, equipment, support staff and site/side marked as required. Patient was prepped and draped in the usual sterile fashion.       Clinical Data: No additional  findings.   Subjective: Chief Complaint  Patient presents with  . Right Knee - Pain   HPI Patient presents today for her right knee pain. She said that it started to hurt about two weeks ago after she twisted it. She said that it hurt badly for a few days, but has improved some. Her pain is located medially. She said that her knee feels weak, but has not given way. No swelling or grinding. She is not taking anything for pain.    Review of Systems  Constitutional: Negative for fatigue.  HENT: Negative for ear pain.   Eyes: Negative for pain.  Respiratory: Negative for shortness of breath.   Cardiovascular: Negative for leg swelling.  Gastrointestinal: Negative for constipation and diarrhea.  Endocrine: Negative for cold intolerance and heat intolerance.  Genitourinary: Negative for difficulty urinating.  Musculoskeletal: Negative for joint swelling.  Skin: Negative for rash.  Allergic/Immunologic: Negative for food allergies.  Neurological: Negative for weakness.  Hematological: Does not bruise/bleed easily.  Psychiatric/Behavioral: Negative for sleep disturbance.     Objective: Vital Signs: Ht 5\' 3"  (1.6 m)   Wt 135 lb (61.2 kg)   BMI 23.91 kg/m   Physical Exam Constitutional:      Appearance: Normal appearance. She is well-developed.  HENT:     Head: Normocephalic.     Nose: Nose normal.  Eyes:  Pupils: Pupils are equal, round, and reactive to light.  Pulmonary:     Effort: Pulmonary effort is normal.  Musculoskeletal:        General: Tenderness present.  Skin:    General: Skin is warm and dry.  Neurological:     Mental Status: She is alert and oriented to person, place, and time.  Psychiatric:        Behavior: Behavior normal.     Ortho Exam  Exam today reveals range of motion from near full extension to about 100degrees.  She is tender to palpation along the medial aspect of the knee.  No real swelling at this time.  She does have some pain with varus  and valgus stressing.  Calf is supple nontender.  Neuro vas intact distally.  Specialty Comments:  No specialty comments available.  Imaging: XR KNEE 3 VIEW RIGHT  Result Date: 11/25/2019 Three-view x-ray of the right knee reveals marked joint space narrowing of the medial compartment with a lot of bony sclerosis both distal femur and proximal tibia medial compartment.  Laterally she shows some calcific deposit in the meniscus of the lateral knee.  She does have a mild varus positioning of the knee.  Some degenerative changes are noted on the lateral and the femoral condyle.  Patellofemoral joint does reveal marked degenerative changes with periarticular spurring more medial than lateral.    PMFS History: Current Outpatient Medications  Medication Sig Dispense Refill  . apixaban (ELIQUIS) 5 MG TABS tablet Take 1 tablet (5 mg total) by mouth 2 (two) times daily. 60 tablet 3  . Biotin 10 MG CAPS Take 10 mg by mouth daily.    . Cholecalciferol (VITAMIN D) 2000 UNITS tablet Take 2,000 Units by mouth daily.      . cyclobenzaprine (FLEXERIL) 5 MG tablet Take 1 tablet (5 mg total) by mouth 3 (three) times daily as needed for muscle spasms. 30 tablet 0  . docusate sodium (COLACE) 100 MG capsule Take 1 capsule (100 mg total) by mouth 2 (two) times daily. 60 capsule 0  . flecainide (TAMBOCOR) 50 MG tablet TAKE 1 TABLET(50 MG) BY MOUTH DAILY 90 tablet 2  . loratadine (CLARITIN) 10 MG tablet Take 10 mg by mouth daily as needed for allergies.    . Magnesium 200 MG TABS Take 200 mg by mouth daily.    . metoprolol tartrate (LOPRESSOR) 100 MG tablet Take 1 tablet (100 mg total) by mouth 2 (two) times daily. 180 tablet 1  . traZODone (DESYREL) 100 MG tablet Take 1 tablet (100 mg total) by mouth at bedtime as needed for sleep. 30 tablet 2  . rosuvastatin (CRESTOR) 10 MG tablet Take 1 tablet (10 mg total) by mouth at bedtime. For Cholesterol/For Stroke Prevention 30 tablet 5   No current  facility-administered medications for this visit.    Patient Active Problem List   Diagnosis Date Noted  . Right knee DJD 11/25/2019  . Chondrocalcinosis due to dicalcium phosphate crystals, of the knee, right 11/25/2019  . Acute CVA (cerebrovascular accident) (HCC) 11/10/2017  . Hypertensive emergency 11/10/2017  . S/P insertion of spinal cord stimulator 10/11/2017  . Chronic pain syndrome 10/11/2017  . Post laminectomy syndrome 10/11/2017  . Radiculopathy, lumbar region 10/11/2017  . Headache 02/23/2017  . Nausea & vomiting 02/23/2017  . Hyponatremia 02/23/2017  . Hypertensive urgency 02/23/2017  . Lumbar herniated disc 02/15/2015  . CKD (chronic kidney disease) stage 3, GFR 30-59 ml/min (HCC) 06/22/2014  . Spondylolisthesis of lumbar region  08/07/2013  . Hyperlipidemia 06/13/2008  . PAF (paroxysmal atrial fibrillation) (HCC) 06/13/2008  . Essential hypertension 11/05/2006  . Coronary atherosclerosis 11/05/2006   Past Medical History:  Diagnosis Date  . Arthritis   . CAD (coronary artery disease)    Mild per remote cath in 2004; normal nuclear in 2012  . Chronic kidney disease    stage3 kidney disease   . Complication of anesthesia    slow to wake up  . Diastolic dysfunction    Grade I, per echo in 2012  . Dysrhythmia   . GERD (gastroesophageal reflux disease)    prevacid  . H/O hiatal hernia   . Headache(784.0)    sinus  . High risk medication use    Flecainide therapy  . History of heartburn   . Hyperlipidemia   . Hypertension   . LVH (left ventricular hypertrophy)    Per echo in July 2012  . Normal nuclear stress test July 2012  . Obesity   . PAF (paroxysmal atrial fibrillation) (HCC)   . Pneumonia    20 yrs ago  . Syncope July 2012    Family History  Problem Relation Age of Onset  . Lung cancer Father   . Stroke Paternal Grandmother     Past Surgical History:  Procedure Laterality Date  . APPENDECTOMY  1952  . BACK SURGERY  1980, 2010   x2  .  CARDIAC CATHETERIZATION  07/28/2002   EF GREATER THAN 55%; MILD CAD  . COLONOSCOPY    . DILATION AND CURETTAGE OF UTERUS    . LUMBAR LAMINECTOMY/DECOMPRESSION MICRODISCECTOMY Right 02/15/2015   Procedure: Right Thoracic twelve-Lumbar one  Microdiskectomy;  Surgeon: Tressie Stalker, MD;  Location: [facility];  Service: Neurosurgery;  Laterality: Right;  Thoracic/Lumbar  . TONSILLECTOMY     age 52 yrs  . TUBAL LIGATION     Social History   Occupational History  . Not on file  Tobacco Use  . Smoking status: Never Smoker  . Smokeless tobacco: Never Used  Substance and Sexual Activity  . Alcohol use: No  . Drug use: No  . Sexual activity: Not on file

## 2019-12-04 DIAGNOSIS — Z79899 Other long term (current) drug therapy: Secondary | ICD-10-CM | POA: Diagnosis not present

## 2019-12-04 DIAGNOSIS — M48061 Spinal stenosis, lumbar region without neurogenic claudication: Secondary | ICD-10-CM | POA: Diagnosis not present

## 2019-12-04 DIAGNOSIS — M792 Neuralgia and neuritis, unspecified: Secondary | ICD-10-CM | POA: Diagnosis not present

## 2019-12-04 DIAGNOSIS — M5136 Other intervertebral disc degeneration, lumbar region: Secondary | ICD-10-CM | POA: Diagnosis not present

## 2019-12-04 DIAGNOSIS — M25551 Pain in right hip: Secondary | ICD-10-CM | POA: Diagnosis not present

## 2019-12-04 DIAGNOSIS — Z9181 History of falling: Secondary | ICD-10-CM | POA: Diagnosis not present

## 2020-02-17 DIAGNOSIS — M792 Neuralgia and neuritis, unspecified: Secondary | ICD-10-CM | POA: Diagnosis not present

## 2020-02-17 DIAGNOSIS — Z6823 Body mass index (BMI) 23.0-23.9, adult: Secondary | ICD-10-CM | POA: Diagnosis not present

## 2020-02-17 DIAGNOSIS — Z9181 History of falling: Secondary | ICD-10-CM | POA: Diagnosis not present

## 2020-02-17 DIAGNOSIS — M48061 Spinal stenosis, lumbar region without neurogenic claudication: Secondary | ICD-10-CM | POA: Diagnosis not present

## 2020-02-17 DIAGNOSIS — Z79899 Other long term (current) drug therapy: Secondary | ICD-10-CM | POA: Diagnosis not present

## 2020-02-17 DIAGNOSIS — M25551 Pain in right hip: Secondary | ICD-10-CM | POA: Diagnosis not present

## 2020-02-17 DIAGNOSIS — M5136 Other intervertebral disc degeneration, lumbar region: Secondary | ICD-10-CM | POA: Diagnosis not present

## 2020-03-25 DIAGNOSIS — I480 Paroxysmal atrial fibrillation: Secondary | ICD-10-CM | POA: Diagnosis not present

## 2020-03-25 DIAGNOSIS — E785 Hyperlipidemia, unspecified: Secondary | ICD-10-CM | POA: Diagnosis not present

## 2020-03-25 DIAGNOSIS — D6869 Other thrombophilia: Secondary | ICD-10-CM | POA: Diagnosis not present

## 2020-03-25 DIAGNOSIS — I69352 Hemiplegia and hemiparesis following cerebral infarction affecting left dominant side: Secondary | ICD-10-CM | POA: Diagnosis not present

## 2020-03-25 DIAGNOSIS — Z23 Encounter for immunization: Secondary | ICD-10-CM | POA: Diagnosis not present

## 2020-03-25 DIAGNOSIS — N183 Chronic kidney disease, stage 3 unspecified: Secondary | ICD-10-CM | POA: Diagnosis not present

## 2020-03-25 DIAGNOSIS — I77819 Aortic ectasia, unspecified site: Secondary | ICD-10-CM | POA: Diagnosis not present

## 2020-03-25 DIAGNOSIS — I129 Hypertensive chronic kidney disease with stage 1 through stage 4 chronic kidney disease, or unspecified chronic kidney disease: Secondary | ICD-10-CM | POA: Diagnosis not present

## 2020-03-25 DIAGNOSIS — Z0000 Encounter for general adult medical examination without abnormal findings: Secondary | ICD-10-CM | POA: Diagnosis not present

## 2020-04-08 ENCOUNTER — Other Ambulatory Visit: Payer: Self-pay | Admitting: Family Medicine

## 2020-04-08 DIAGNOSIS — I77819 Aortic ectasia, unspecified site: Secondary | ICD-10-CM

## 2020-04-20 ENCOUNTER — Ambulatory Visit
Admission: RE | Admit: 2020-04-20 | Discharge: 2020-04-20 | Disposition: A | Discharge status: Home / Self Care | Payer: Medicare PPO | Source: Ambulatory Visit | Attending: Family Medicine | Admitting: Family Medicine

## 2020-04-20 DIAGNOSIS — I712 Thoracic aortic aneurysm, without rupture: Secondary | ICD-10-CM | POA: Diagnosis not present

## 2020-04-20 DIAGNOSIS — I700 Atherosclerosis of aorta: Secondary | ICD-10-CM | POA: Diagnosis not present

## 2020-04-20 DIAGNOSIS — I517 Cardiomegaly: Secondary | ICD-10-CM | POA: Diagnosis not present

## 2020-04-20 DIAGNOSIS — I77819 Aortic ectasia, unspecified site: Secondary | ICD-10-CM

## 2020-04-20 MED ORDER — IOPAMIDOL (ISOVUE-370) INJECTION 76%
60.0000 mL | Freq: Once | INTRAVENOUS | Status: AC | PRN
  Administered 2020-04-20: 60 mL via INTRAVENOUS

## 2020-05-04 ENCOUNTER — Other Ambulatory Visit: Payer: Medicare PPO

## 2020-05-18 DIAGNOSIS — M5136 Other intervertebral disc degeneration, lumbar region: Secondary | ICD-10-CM | POA: Diagnosis not present

## 2020-05-18 DIAGNOSIS — Z79899 Other long term (current) drug therapy: Secondary | ICD-10-CM | POA: Diagnosis not present

## 2020-05-18 DIAGNOSIS — M25551 Pain in right hip: Secondary | ICD-10-CM | POA: Diagnosis not present

## 2020-05-18 DIAGNOSIS — Z6823 Body mass index (BMI) 23.0-23.9, adult: Secondary | ICD-10-CM | POA: Diagnosis not present

## 2020-05-18 DIAGNOSIS — M48061 Spinal stenosis, lumbar region without neurogenic claudication: Secondary | ICD-10-CM | POA: Diagnosis not present

## 2020-05-18 DIAGNOSIS — Z9181 History of falling: Secondary | ICD-10-CM | POA: Diagnosis not present

## 2020-05-18 DIAGNOSIS — M792 Neuralgia and neuritis, unspecified: Secondary | ICD-10-CM | POA: Diagnosis not present

## 2020-05-24 ENCOUNTER — Other Ambulatory Visit: Payer: Self-pay | Admitting: Cardiology

## 2020-05-25 ENCOUNTER — Other Ambulatory Visit: Payer: Self-pay | Admitting: Cardiology

## 2020-05-26 ENCOUNTER — Other Ambulatory Visit: Payer: Self-pay | Admitting: Cardiology

## 2020-06-15 ENCOUNTER — Other Ambulatory Visit: Payer: Self-pay | Admitting: Cardiology

## 2020-06-18 ENCOUNTER — Telehealth: Payer: Self-pay | Admitting: Cardiology

## 2020-06-18 ENCOUNTER — Other Ambulatory Visit: Payer: Self-pay

## 2020-06-18 MED ORDER — FLECAINIDE ACETATE 50 MG PO TABS: 50.0000 mg | ORAL_TABLET | Freq: Every day | ORAL | 0 refills | Status: DC

## 2020-06-18 NOTE — Telephone Encounter (Signed)
*  STAT* If patient is at the pharmacy, call can be transferred to refill team.   1. Which medications need to be refilled? (please list name of each medication and dose if known)  flecainide (TAMBOCOR) 50 MG tablet  2. Which pharmacy/location (including street and city if local pharmacy) is medication to be sent to? [id number] [place] [address]   3. Do they need a 30 day or 90 day supply?  Patient is requesting enough medication to last her until her appointment on 09/09/20 with Dr. Anne Fu.

## 2020-08-17 DIAGNOSIS — Z9181 History of falling: Secondary | ICD-10-CM | POA: Diagnosis not present

## 2020-08-17 DIAGNOSIS — M542 Cervicalgia: Secondary | ICD-10-CM | POA: Diagnosis not present

## 2020-08-17 DIAGNOSIS — M792 Neuralgia and neuritis, unspecified: Secondary | ICD-10-CM | POA: Diagnosis not present

## 2020-08-17 DIAGNOSIS — Z6823 Body mass index (BMI) 23.0-23.9, adult: Secondary | ICD-10-CM | POA: Diagnosis not present

## 2020-08-17 DIAGNOSIS — M48061 Spinal stenosis, lumbar region without neurogenic claudication: Secondary | ICD-10-CM | POA: Diagnosis not present

## 2020-08-17 DIAGNOSIS — Z79899 Other long term (current) drug therapy: Secondary | ICD-10-CM | POA: Diagnosis not present

## 2020-08-17 DIAGNOSIS — M549 Dorsalgia, unspecified: Secondary | ICD-10-CM | POA: Diagnosis not present

## 2020-08-17 DIAGNOSIS — M5136 Other intervertebral disc degeneration, lumbar region: Secondary | ICD-10-CM | POA: Diagnosis not present

## 2020-08-17 DIAGNOSIS — G8929 Other chronic pain: Secondary | ICD-10-CM | POA: Diagnosis not present

## 2020-08-17 DIAGNOSIS — M25551 Pain in right hip: Secondary | ICD-10-CM | POA: Diagnosis not present

## 2020-08-20 ENCOUNTER — Other Ambulatory Visit: Payer: Self-pay | Admitting: Cardiology

## 2020-09-09 ENCOUNTER — Other Ambulatory Visit: Payer: Self-pay

## 2020-09-09 ENCOUNTER — Ambulatory Visit (HOSPITAL_BASED_OUTPATIENT_CLINIC_OR_DEPARTMENT_OTHER): Payer: Medicare PPO | Admitting: Cardiology

## 2020-09-09 VITALS — BP 142/92 | HR 46 | Ht 63.0 in | Wt 137.4 lb

## 2020-09-09 DIAGNOSIS — I10 Essential (primary) hypertension: Secondary | ICD-10-CM

## 2020-09-09 DIAGNOSIS — Z79899 Other long term (current) drug therapy: Secondary | ICD-10-CM

## 2020-09-09 DIAGNOSIS — I480 Paroxysmal atrial fibrillation: Secondary | ICD-10-CM

## 2020-09-09 DIAGNOSIS — N1831 Chronic kidney disease, stage 3a: Secondary | ICD-10-CM

## 2020-09-09 MED ORDER — METOPROLOL TARTRATE 25 MG PO TABS
25.0000 mg | ORAL_TABLET | Freq: Two times a day (BID) | ORAL | 3 refills | Status: DC
Start: 2020-09-09 — End: 2021-09-26

## 2020-09-09 NOTE — Progress Notes (Signed)
Cardiology Office Note:    Date:  09/09/2020   ID:  Cheryl [name], DOB 16-Nov-1934, MRN [id number]  PCP:  Laurann Montana, MD   [facility] Providers Cardiologist:  Donato Schultz, MD     Referring MD: Laurann Montana, MD     History of Present Illness:    Cheryl [name] is a 86 y.o. female pronounced [name], here for follow-up of atrial fibrillation on flecainide.  Previously refused anticoagulation despite counseling on stroke.  She is now taking Eliquis.  Excellent.  Sunday night had AFIB. Took another flecainide.  Atrial fibrillation was 30-45 min in total. Wiggling.   Taking metoprolol 50am and 50pm. HR 46, we will decrease to 25/25.  Has an LDL of 211 in 2013 did not wish to take statins.  She is now taking Crestor twice a week.  Excellent.  Has had issues with hyponatremia on HCTZ.  She is off of this medication.  Overall having no fevers chills nausea vomiting syncope bleeding.  Her husband has been battling worsening dementia for several years.  Past Medical History:  Diagnosis Date   Arthritis    CAD (coronary artery disease)    Mild per remote cath in 2004; normal nuclear in 2012   Chronic kidney disease    stage3 kidney disease    Complication of anesthesia    slow to wake up   Diastolic dysfunction    Grade I, per echo in 2012   Dysrhythmia    GERD (gastroesophageal reflux disease)    prevacid   H/O hiatal hernia    Headache(784.0)    sinus   High risk medication use    Flecainide therapy   History of heartburn    Hyperlipidemia    Hypertension    LVH (left ventricular hypertrophy)    Per echo in July 2012   Normal nuclear stress test July 2012   Obesity    PAF (paroxysmal atrial fibrillation) (HCC)    Pneumonia    20 yrs ago   Syncope July 2012    Past Surgical History:  Procedure Laterality Date   APPENDECTOMY  1952   BACK SURGERY  1980, 2010   x2   CARDIAC CATHETERIZATION  07/28/2002   EF GREATER THAN 55%; MILD CAD   COLONOSCOPY     DILATION  AND CURETTAGE OF UTERUS     LUMBAR LAMINECTOMY/DECOMPRESSION MICRODISCECTOMY Right 02/15/2015   Procedure: Right Thoracic twelve-Lumbar one  Microdiskectomy;  Surgeon: Tressie Stalker, MD;  Location: [facility];  Service: Neurosurgery;  Laterality: Right;  Thoracic/Lumbar   TONSILLECTOMY     age 46 yrs   TUBAL LIGATION      Current Medications: Current Meds  Medication Sig   apixaban (ELIQUIS) 5 MG TABS tablet Take 1 tablet (5 mg total) by mouth 2 (two) times daily.   Biotin 10 MG CAPS Take 10 mg by mouth daily.   Cholecalciferol (VITAMIN D) 2000 UNITS tablet Take 2,000 Units by mouth daily.     cyclobenzaprine (FLEXERIL) 5 MG tablet Take 1 tablet (5 mg total) by mouth 3 (three) times daily as needed for muscle spasms.   docusate sodium (COLACE) 100 MG capsule Take 1 capsule (100 mg total) by mouth 2 (two) times daily.   flecainide (TAMBOCOR) 50 MG tablet Take 1 tablet (50 mg total) by mouth daily. Please keep upcoming appointment for more refills   loratadine (CLARITIN) 10 MG tablet Take 10 mg by mouth daily as needed for allergies.   Magnesium 200 MG  TABS Take 200 mg by mouth daily.   metoprolol tartrate (LOPRESSOR) 25 MG tablet Take 1 tablet (25 mg total) by mouth 2 (two) times daily.   rosuvastatin (CRESTOR) 10 MG tablet Take 10 mg by mouth 2 (two) times a week.   traZODone (DESYREL) 100 MG tablet Take 1 tablet (100 mg total) by mouth at bedtime as needed for sleep.   [DISCONTINUED] metoprolol tartrate (LOPRESSOR) 100 MG tablet Take 1 tablet (100 mg total) by mouth 2 (two) times daily.     Allergies:   Codeine phosphate, Lipitor [atorvastatin calcium], and Sulfonamide derivatives   Social History   Socioeconomic History   Marital status: Married    Spouse name: Not on file   Number of children: Not on file   Years of education: Not on file   Highest education level: Not on file  Occupational History   Not on file  Tobacco Use   Smoking status: Never   Smokeless tobacco:  Never  Substance and Sexual Activity   Alcohol use: No   Drug use: No   Sexual activity: Not on file  Other Topics Concern   Not on file  Social History Narrative   Not on file   Social Determinants of Health   Financial Resource Strain: Not on file  Food Insecurity: Not on file  Transportation Needs: Not on file  Physical Activity: Not on file  Stress: Not on file  Social Connections: Not on file     Family History: The patient's family history includes Lung cancer in her father; Stroke in her paternal grandmother.  ROS:   Please see the history of present illness.     All other systems reviewed and are negative.  EKGs/Labs/Other Studies Reviewed:    The following studies were reviewed today: Echocardiogram 2012 - Moderate LVH EF 55 to 65%.  Mild mitral regurgitation with systolic anterior motion.  PA pressure 32.  ECHO 2019:  - Left ventricle: The cavity size was normal. Wall thickness was    increased in a pattern of moderate LVH. Systolic function was    normal. The estimated ejection fraction was in the range of 55%    to 60%. Wall motion was normal; there were no regional wall    motion abnormalities. Doppler parameters are consistent with    abnormal left ventricular relaxation (grade 1 diastolic    dysfunction).  - Aortic valve: There was no stenosis.  - Mitral valve: There was trivial regurgitation.  - Left atrium: The atrium was mildly dilated.  - Right ventricle: The cavity size was normal. Systolic function    was normal.  - Pulmonary arteries: PA peak pressure: 24 mm Hg (S).  - Inferior vena cava: The vessel was normal in size. The    respirophasic diameter changes were in the normal range (>= 50%),    consistent with normal central venous pressure.   EKG:  EKG is  ordered today.  The ekg ordered today demonstrates sinus bradycardia 46 bpm left anterior fascicular block poor R wave progression nonspecific ST-T wave changes.  Recent Labs: No results  found for requested labs within last 8760 hours.  Recent Lipid Panel    Component Value Date/Time   CHOL 241 (H) 11/11/2017 0914   TRIG 61 11/11/2017 0914   HDL 55 11/11/2017 0914   CHOLHDL 4.4 11/11/2017 0914   VLDL 12 11/11/2017 0914   LDLCALC 174 (H) 11/11/2017 0914   LDLDIRECT 154.7 07/13/2010 0834     Risk Assessment/Calculations:  CHA2DS2-VASc Score = 6  This indicates a 9.7% annual risk of stroke. The patient's score is based upon: CHF History: No HTN History: Yes Diabetes History: No Stroke History: Yes Vascular Disease History: No Age Score: 2 Gender Score: 1         Physical Exam:    VS:  BP (!) 142/92   Pulse (!) 46   Ht 5\' 3"  (1.6 m)   Wt 137 lb 6.4 oz (62.3 kg)   BMI 24.34 kg/m     Wt Readings from Last 3 Encounters:  09/09/20 137 lb 6.4 oz (62.3 kg)  11/25/19 135 lb (61.2 kg)  10/09/18 134 lb 9.6 oz (61.1 kg)     GEN:  Well nourished, well developed in no acute distress HEENT: Normal NECK: No JVD; No carotid bruits LYMPHATICS: No lymphadenopathy CARDIAC: brady reg, no murmurs, rubs, gallops RESPIRATORY:  Clear to auscultation without rales, wheezing or rhonchi  ABDOMEN: Soft, non-tender, non-distended MUSCULOSKELETAL:  No edema; No deformity  SKIN: Warm and dry NEUROLOGIC:  Alert and oriented x 3 PSYCHIATRIC:  Normal affect   ASSESSMENT:    1. PAF (paroxysmal atrial fibrillation) (HCC)   2. Medication management   3. Essential hypertension   4. Stage 3a chronic kidney disease (HCC)    PLAN:    In order of problems listed above:  Paroxysmal atrial fibrillation - Have had lengthy discussions about the potential for stroke risk.  Thankfully she is now on Eliquis.   -She is on flecainide, unconventional dose once daily.  She likes to take it more in the nighttime.  She says he did not do well with twice daily.  During her recent episode of irregular heartbeat she took an additional flecainide and this worked. - Decrease metoprolol to  25 mg twice a day.  Her heart rate on resting EKG is 46 bpm with left anterior fascicular block. - For hypertension her ARB or amlodipine did not work well with her.  Chronic anticoagulation - Continue with Eliquis.  Minor nuisance ecchymosis on her forearm.  Checking CBC and creatinine.  If creatinine is greater than 1.5 given her age greater than 1080, we will dose adjust Eliquis to 2.5 mg twice a day.  Essential hypertension - Cannot take hydralazine ARB or amlodipine.  Currently on metoprolol.  No changes made.  Avoid HCTZ which can cause hyponatremia.  Hyperlipidemia - Was taking Crestor 10 mg 2 times a week.  Had a mild stroke in the past.  LDL was 211 in 2013, most recently 117 outside labs.   Chronic kidney disease stage IIIa - GFR in the 50% range.  Avoiding NSAIDs.  Most recent creatinine 1.4   Pulmonary hypertension - Previously seen on echocardiogram with pulmonary pressures of 62 mmHg.  No evidence of thromboembolic disease on CT scan of chest.  No shortness of breath.  Continue to treat hypertension.        Medication Adjustments/Labs and Tests Ordered: Current medicines are reviewed at length with the patient today.  Concerns regarding medicines are outlined above.  Orders Placed This Encounter  Procedures   CBC   Basic metabolic panel   EKG 12-Lead    Meds ordered this encounter  Medications   metoprolol tartrate (LOPRESSOR) 25 MG tablet    Sig: Take 1 tablet (25 mg total) by mouth 2 (two) times daily.    Dispense:  180 tablet    Refill:  3     Patient Instructions  Medication Instructions:  Please decrease your  Metoprolol [place] to 25 mg twice a day. Continue all other medications as listed.  *If you need a refill on your cardiac medications before your next appointment, please call your pharmacy*  Lab Work: Please have  blood work at the [facility] or any [facility]. (BMP,CBC)  If you have labs (blood work) drawn today and your tests are  completely normal, you will receive your results only by: MyChart Message (if you have MyChart) OR A paper copy in the mail If you have any lab test that is abnormal or we need to change your treatment, we will call you to review the results. Follow-Up: At [facility], you and your health needs are our priority.  As part of our continuing mission to provide you with exceptional heart care, we have created designated Provider Care Teams.  These Care Teams include your primary Cardiologist (physician) and Advanced Practice Providers (APPs -  Physician Assistants and Nurse Practitioners) who all work together to provide you with the care you need, when you need it.  We recommend signing up for the patient portal called "MyChart".  Sign up information is provided on this After Visit Summary.  MyChart is used to connect with patients for Virtual Visits (Telemedicine).  Patients are able to view lab/test results, encounter notes, upcoming appointments, etc.  Non-urgent messages can be sent to your provider as well.   To learn more about what you can do with MyChart, go to [email or web address].    Your next appointment:   6 month(s)  The format for your next appointment:   In Person  Provider:   You will see one of the following Advanced Practice Providers on your designated Care Team:   Nada Boozer, NP or any APP.  Then, Donato Schultz, MD will plan to see you again in 1 year(s).   Thank you for choosing [facility]!!     Signed, Donato Schultz, MD  09/09/2020 2:28 PM    [facility]

## 2020-09-09 NOTE — Patient Instructions (Signed)
Medication Instructions:  Please decrease your Metoprolol Tartarte to 25 mg twice a day. Continue all other medications as listed.  *If you need a refill on your cardiac medications before your next appointment, please call your pharmacy*  Lab Work: Please have  blood work at the [facility] or any [facility]. (BMP,CBC)  If you have labs (blood work) drawn today and your tests are completely normal, you will receive your results only by: MyChart Message (if you have MyChart) OR A paper copy in the mail If you have any lab test that is abnormal or we need to change your treatment, we will call you to review the results. Follow-Up: At [facility], you and your health needs are our priority.  As part of our continuing mission to provide you with exceptional heart care, we have created designated Provider Care Teams.  These Care Teams include your primary Cardiologist (physician) and Advanced Practice Providers (APPs -  Physician Assistants and Nurse Practitioners) who all work together to provide you with the care you need, when you need it.  We recommend signing up for the patient portal called "MyChart".  Sign up information is provided on this After Visit Summary.  MyChart is used to connect with patients for Virtual Visits (Telemedicine).  Patients are able to view lab/test results, encounter notes, upcoming appointments, etc.  Non-urgent messages can be sent to your provider as well.   To learn more about what you can do with MyChart, go to [email or web address].    Your next appointment:   6 month(s)  The format for your next appointment:   In Person  Provider:   You will see one of the following Advanced Practice Providers on your designated Care Team:   Nada Boozer, NP or any APP.  Then, Donato Schultz, MD will plan to see you again in 1 year(s).   Thank you for choosing [facility]!!

## 2020-09-10 ENCOUNTER — Other Ambulatory Visit: Payer: Medicare PPO

## 2020-09-13 ENCOUNTER — Other Ambulatory Visit: Payer: Self-pay | Admitting: Cardiology

## 2020-09-14 ENCOUNTER — Other Ambulatory Visit: Payer: Medicare PPO

## 2020-09-14 ENCOUNTER — Other Ambulatory Visit: Payer: Self-pay

## 2020-09-14 DIAGNOSIS — I10 Essential (primary) hypertension: Secondary | ICD-10-CM | POA: Diagnosis not present

## 2020-09-14 DIAGNOSIS — I480 Paroxysmal atrial fibrillation: Secondary | ICD-10-CM | POA: Diagnosis not present

## 2020-09-14 DIAGNOSIS — Z79899 Other long term (current) drug therapy: Secondary | ICD-10-CM | POA: Diagnosis not present

## 2020-09-15 LAB — BASIC METABOLIC PANEL
BUN/Creatinine Ratio: 14 (ref 12–28)
BUN: 18 mg/dL (ref 8–27)
CO2: 28 mmol/L (ref 20–29)
Calcium: 10.6 mg/dL — ABNORMAL HIGH (ref 8.7–10.3)
Chloride: 95 mmol/L — ABNORMAL LOW (ref 96–106)
Creatinine, Ser: 1.33 mg/dL — ABNORMAL HIGH (ref 0.57–1.00)
Glucose: 98 mg/dL (ref 65–99)
Potassium: 5 mmol/L (ref 3.5–5.2)
Sodium: 137 mmol/L (ref 134–144)
eGFR: 39 mL/min/{1.73_m2} — ABNORMAL LOW (ref >59)

## 2020-09-15 LAB — CBC
Hematocrit: 37.7 % (ref 34.0–46.6)
Hemoglobin: 12.5 g/dL (ref 11.1–15.9)
MCH: 31.1 pg (ref 26.6–33.0)
MCHC: 33.2 g/dL (ref 31.5–35.7)
MCV: 94 fL (ref 79–97)
Platelets: 238 10*3/uL (ref 150–450)
RBC: 4.02 x10E6/uL (ref 3.77–5.28)
RDW: 12.1 % (ref 11.7–15.4)
WBC: 4.6 10*3/uL (ref 3.4–10.8)

## 2020-09-21 ENCOUNTER — Encounter: Payer: Self-pay | Admitting: *Deleted

## 2020-09-23 DIAGNOSIS — I480 Paroxysmal atrial fibrillation: Secondary | ICD-10-CM | POA: Diagnosis not present

## 2020-09-23 DIAGNOSIS — I129 Hypertensive chronic kidney disease with stage 1 through stage 4 chronic kidney disease, or unspecified chronic kidney disease: Secondary | ICD-10-CM | POA: Diagnosis not present

## 2020-09-23 DIAGNOSIS — R5382 Chronic fatigue, unspecified: Secondary | ICD-10-CM | POA: Diagnosis not present

## 2020-09-23 DIAGNOSIS — N183 Chronic kidney disease, stage 3 unspecified: Secondary | ICD-10-CM | POA: Diagnosis not present

## 2020-09-23 DIAGNOSIS — Z23 Encounter for immunization: Secondary | ICD-10-CM | POA: Diagnosis not present

## 2020-09-23 DIAGNOSIS — D6869 Other thrombophilia: Secondary | ICD-10-CM | POA: Diagnosis not present

## 2020-09-28 DIAGNOSIS — I10 Essential (primary) hypertension: Secondary | ICD-10-CM | POA: Diagnosis not present

## 2020-11-30 DIAGNOSIS — Z79899 Other long term (current) drug therapy: Secondary | ICD-10-CM | POA: Diagnosis not present

## 2020-11-30 DIAGNOSIS — M5136 Other intervertebral disc degeneration, lumbar region: Secondary | ICD-10-CM | POA: Diagnosis not present

## 2020-11-30 DIAGNOSIS — I10 Essential (primary) hypertension: Secondary | ICD-10-CM | POA: Diagnosis not present

## 2020-11-30 DIAGNOSIS — M792 Neuralgia and neuritis, unspecified: Secondary | ICD-10-CM | POA: Diagnosis not present

## 2020-11-30 DIAGNOSIS — Z6823 Body mass index (BMI) 23.0-23.9, adult: Secondary | ICD-10-CM | POA: Diagnosis not present

## 2020-11-30 DIAGNOSIS — Z9181 History of falling: Secondary | ICD-10-CM | POA: Diagnosis not present

## 2020-11-30 DIAGNOSIS — Z0000 Encounter for general adult medical examination without abnormal findings: Secondary | ICD-10-CM | POA: Diagnosis not present

## 2020-11-30 DIAGNOSIS — M25551 Pain in right hip: Secondary | ICD-10-CM | POA: Diagnosis not present

## 2020-11-30 DIAGNOSIS — M48061 Spinal stenosis, lumbar region without neurogenic claudication: Secondary | ICD-10-CM | POA: Diagnosis not present

## 2020-12-02 DIAGNOSIS — Z79899 Other long term (current) drug therapy: Secondary | ICD-10-CM | POA: Diagnosis not present

## 2020-12-28 DIAGNOSIS — M5136 Other intervertebral disc degeneration, lumbar region: Secondary | ICD-10-CM | POA: Diagnosis not present

## 2020-12-28 DIAGNOSIS — Z6824 Body mass index (BMI) 24.0-24.9, adult: Secondary | ICD-10-CM | POA: Diagnosis not present

## 2020-12-28 DIAGNOSIS — M48061 Spinal stenosis, lumbar region without neurogenic claudication: Secondary | ICD-10-CM | POA: Diagnosis not present

## 2020-12-28 DIAGNOSIS — I10 Essential (primary) hypertension: Secondary | ICD-10-CM | POA: Diagnosis not present

## 2020-12-28 DIAGNOSIS — Z9181 History of falling: Secondary | ICD-10-CM | POA: Diagnosis not present

## 2020-12-28 DIAGNOSIS — M25551 Pain in right hip: Secondary | ICD-10-CM | POA: Diagnosis not present

## 2020-12-28 DIAGNOSIS — Z79899 Other long term (current) drug therapy: Secondary | ICD-10-CM | POA: Diagnosis not present

## 2020-12-28 DIAGNOSIS — M792 Neuralgia and neuritis, unspecified: Secondary | ICD-10-CM | POA: Diagnosis not present

## 2021-01-24 ENCOUNTER — Telehealth: Payer: Medicare PPO | Admitting: Family Medicine

## 2021-01-24 DIAGNOSIS — B027 Disseminated zoster: Secondary | ICD-10-CM

## 2021-01-24 MED ORDER — VALACYCLOVIR HCL 1 G PO TABS
1000.0000 mg | ORAL_TABLET | Freq: Three times a day (TID) | ORAL | 0 refills | Status: AC
Start: 2021-01-24 — End: 2021-02-03

## 2021-01-24 NOTE — Progress Notes (Signed)
Virtual Visit Consent   Cheryl [name], you are scheduled for a virtual visit with a [facility] provider today.     Just as with appointments in the office, your consent must be obtained to participate.  Your consent will be active for this visit and any virtual visit you may have with one of our providers in the next 365 days.     If you have a MyChart account, a copy of this consent can be sent to you electronically.  All virtual visits are billed to your insurance company just like a traditional visit in the office.    As this is a virtual visit, video technology does not allow for your provider to perform a traditional examination.  This may limit your provider's ability to fully assess your condition.  If your provider identifies any concerns that need to be evaluated in person or the need to arrange testing (such as labs, EKG, etc.), we will make arrangements to do so.     Although advances in technology are sophisticated, we cannot ensure that it will always work on either your end or our end.  If the connection with a video visit is poor, the visit may have to be switched to a telephone visit.  With either a video or telephone visit, we are not always able to ensure that we have a secure connection.     I need to obtain your verbal consent now.   Are you willing to proceed with your visit today?    Cheryl [name] has provided verbal consent on 01/24/2021 for a virtual visit (video or telephone).   Perlie Mayo, NP   Date: 01/24/2021 12:48 PM   Virtual Visit via Video Note   I, Perlie Mayo, connected with  Cheryl [name]  ([id number], [birth year]) on 01/24/21 at 12:45 PM EST by a video-enabled telemedicine application and verified that I am speaking with the correct person using two identifiers.  Location: Patient: Virtual Visit Location Patient: Home Provider: Virtual Visit Location Provider: Home Office   I discussed the limitations of evaluation and management by telemedicine  and the availability of in person appointments. The patient expressed understanding and agreed to proceed.    History of Present Illness: Cheryl [name] is a 86 y.o. who identifies as a female who was assigned female at birth, and is being seen today for shingles. Started last week- unable to see PCP or get seen at [facility] due to provider leaving the office for emergency. Presents today with increased rash and discomfort. Desire for antiviral. No other S&S to discuss  Rash started near waist. Then developed under left breast and is not across back following bra lin to the right side. No fevers or signs of infection  Daughter present in video-  Problems:  Patient Active Problem List   Diagnosis Date Noted   Right knee DJD 11/25/2019   Chondrocalcinosis due to dicalcium phosphate crystals, of the knee, right 11/25/2019   Acute CVA (cerebrovascular accident) ([place]) 11/10/2017   Hypertensive emergency 11/10/2017   S/P insertion of spinal cord stimulator 10/11/2017   Chronic pain syndrome 10/11/2017   Post laminectomy syndrome 10/11/2017   Radiculopathy, lumbar region 10/11/2017   Headache 02/23/2017   Nausea & vomiting 02/23/2017   Hyponatremia 02/23/2017   Hypertensive urgency 02/23/2017   Lumbar herniated disc 02/15/2015   CKD (chronic kidney disease) stage 3, GFR 30-59 ml/min (HCC) 06/22/2014   Spondylolisthesis of lumbar region 08/07/2013   Hyperlipidemia 06/13/2008  PAF (paroxysmal atrial fibrillation) (HCC) 06/13/2008   Essential hypertension 11/05/2006   Coronary atherosclerosis 11/05/2006    Allergies:  Allergies  Allergen Reactions   Codeine Phosphate     headache   Lipitor [Atorvastatin Calcium]     Muscle pain and tiredness   Sulfonamide Derivatives     REACTION: unspecified   Medications:  Current Outpatient Medications:    apixaban (ELIQUIS) 5 MG TABS tablet, Take 1 tablet (5 mg total) by mouth 2 (two) times daily., Disp: 60 tablet, Rfl: 3   Biotin 10 MG CAPS, Take 10  mg by mouth daily., Disp: , Rfl:    Cholecalciferol (VITAMIN D) 2000 UNITS tablet, Take 2,000 Units by mouth daily.  , Disp: , Rfl:    cyclobenzaprine (FLEXERIL) 5 MG tablet, Take 1 tablet (5 mg total) by mouth 3 (three) times daily as needed for muscle spasms., Disp: 30 tablet, Rfl: 0   docusate sodium (COLACE) 100 MG capsule, Take 1 capsule (100 mg total) by mouth 2 (two) times daily., Disp: 60 capsule, Rfl: 0   flecainide (TAMBOCOR) 50 MG tablet, TAKE 1 TABLET BY MOUTH DAILY. PLEASE KEEP UPCOMING APPOINTMENT FOR MORE REFILLS, Disp: 90 tablet, Rfl: 3   loratadine (CLARITIN) 10 MG tablet, Take 10 mg by mouth daily as needed for allergies., Disp: , Rfl:    Magnesium 200 MG TABS, Take 200 mg by mouth daily., Disp: , Rfl:    metoprolol tartrate (LOPRESSOR) 25 MG tablet, Take 1 tablet (25 mg total) by mouth 2 (two) times daily., Disp: 180 tablet, Rfl: 3   rosuvastatin (CRESTOR) 10 MG tablet, Take 10 mg by mouth 2 (two) times a week., Disp: , Rfl:    traZODone (DESYREL) 100 MG tablet, Take 1 tablet (100 mg total) by mouth at bedtime as needed for sleep., Disp: 30 tablet, Rfl: 2  Observations/Objective: Patient is well-developed, well-nourished in no acute distress.  Resting comfortably  at home.  Head is normocephalic, atraumatic.  No labored breathing.  Speech is clear and coherent with logical content.  Patient is alert and oriented at baseline.  Classic band of shingles rash-red with blister from under left breast to right side of back, and one near waist.  Assessment and Plan: 1. Disseminated herpes zoster  Disseminated shingles (crossed the mid line on back) Classic presentation will treat with valtrex-- appears to have had for greater than 3 days- advised of this duration might still be as long given the treatment late start.  Aware of isolation needs   - valACYclovir (VALTREX) 1000 MG tablet; Take 1 tablet (1,000 mg total) by mouth 3 (three) times daily for 10 days.  Dispense: 30  tablet; Refill: 0   Reviewed side effects, risks and benefits of medication.    Patient acknowledged agreement and understanding of the plan.   I discussed the assessment and treatment plan with the patient. The patient was provided an opportunity to ask questions and all were answered. The patient agreed with the plan and demonstrated an understanding of the instructions.   The patient was advised to call back or seek an in-person evaluation if the symptoms worsen or if the condition fails to improve as anticipated.   The above assessment and management plan was discussed with the patient. The patient verbalized understanding of and has agreed to the management plan. Patient is aware to call the clinic if symptoms persist or worsen. Patient is aware when to return to the clinic for a follow-up visit. Patient educated on when it  is appropriate to go to the emergency department.   Follow Up Instructions: I discussed the assessment and treatment plan with the patient. The patient was provided an opportunity to ask questions and all were answered. The patient agreed with the plan and demonstrated an understanding of the instructions.  A copy of instructions were sent to the patient via MyChart unless otherwise noted below.   The patient was advised to call back or seek an in-person evaluation if the symptoms worsen or if the condition fails to improve as anticipated.  Time:  I spent 15 minutes with the patient via telehealth technology discussing the above problems/concerns.    Perlie Mayo, NP

## 2021-01-24 NOTE — Patient Instructions (Signed)

## 2021-03-31 DIAGNOSIS — N183 Chronic kidney disease, stage 3 unspecified: Secondary | ICD-10-CM | POA: Diagnosis not present

## 2021-03-31 DIAGNOSIS — D6869 Other thrombophilia: Secondary | ICD-10-CM | POA: Diagnosis not present

## 2021-03-31 DIAGNOSIS — E785 Hyperlipidemia, unspecified: Secondary | ICD-10-CM | POA: Diagnosis not present

## 2021-03-31 DIAGNOSIS — R5382 Chronic fatigue, unspecified: Secondary | ICD-10-CM | POA: Diagnosis not present

## 2021-03-31 DIAGNOSIS — I480 Paroxysmal atrial fibrillation: Secondary | ICD-10-CM | POA: Diagnosis not present

## 2021-03-31 DIAGNOSIS — M8588 Other specified disorders of bone density and structure, other site: Secondary | ICD-10-CM | POA: Diagnosis not present

## 2021-03-31 DIAGNOSIS — I129 Hypertensive chronic kidney disease with stage 1 through stage 4 chronic kidney disease, or unspecified chronic kidney disease: Secondary | ICD-10-CM | POA: Diagnosis not present

## 2021-03-31 DIAGNOSIS — B0229 Other postherpetic nervous system involvement: Secondary | ICD-10-CM | POA: Diagnosis not present

## 2021-03-31 DIAGNOSIS — Z0000 Encounter for general adult medical examination without abnormal findings: Secondary | ICD-10-CM | POA: Diagnosis not present

## 2021-07-07 ENCOUNTER — Other Ambulatory Visit: Payer: Self-pay | Admitting: Cardiology

## 2021-07-08 NOTE — Telephone Encounter (Signed)
Prescription refill request for Eliquis received. Indication: PAF Last office visit: 09/09/20  Michele Rockers MD Scr: 1.33 on 09/14/20 Age: 87 Weight: 62.3kg  Based on above findings Eliquis 5mg  twice daily is the appropriate dose.  Refill approved.

## 2021-08-30 ENCOUNTER — Other Ambulatory Visit: Payer: Self-pay | Admitting: Cardiology

## 2021-09-24 ENCOUNTER — Other Ambulatory Visit: Payer: Self-pay | Admitting: Cardiology

## 2021-09-26 ENCOUNTER — Other Ambulatory Visit: Payer: Self-pay

## 2021-09-26 ENCOUNTER — Other Ambulatory Visit (HOSPITAL_BASED_OUTPATIENT_CLINIC_OR_DEPARTMENT_OTHER): Payer: Self-pay | Admitting: Cardiology

## 2021-09-26 MED ORDER — FLECAINIDE ACETATE 50 MG PO TABS: ORAL_TABLET | ORAL | 0 refills | Status: DC

## 2021-10-04 DIAGNOSIS — Z6824 Body mass index (BMI) 24.0-24.9, adult: Secondary | ICD-10-CM | POA: Diagnosis not present

## 2021-10-04 DIAGNOSIS — M48061 Spinal stenosis, lumbar region without neurogenic claudication: Secondary | ICD-10-CM | POA: Diagnosis not present

## 2021-10-04 DIAGNOSIS — I10 Essential (primary) hypertension: Secondary | ICD-10-CM | POA: Diagnosis not present

## 2021-10-04 DIAGNOSIS — M5136 Other intervertebral disc degeneration, lumbar region: Secondary | ICD-10-CM | POA: Diagnosis not present

## 2021-10-04 DIAGNOSIS — M25551 Pain in right hip: Secondary | ICD-10-CM | POA: Diagnosis not present

## 2021-10-04 DIAGNOSIS — E559 Vitamin D deficiency, unspecified: Secondary | ICD-10-CM | POA: Diagnosis not present

## 2021-10-04 DIAGNOSIS — Z0000 Encounter for general adult medical examination without abnormal findings: Secondary | ICD-10-CM | POA: Diagnosis not present

## 2021-10-04 DIAGNOSIS — M129 Arthropathy, unspecified: Secondary | ICD-10-CM | POA: Diagnosis not present

## 2021-10-04 DIAGNOSIS — Z9181 History of falling: Secondary | ICD-10-CM | POA: Diagnosis not present

## 2021-10-04 DIAGNOSIS — Z79899 Other long term (current) drug therapy: Secondary | ICD-10-CM | POA: Diagnosis not present

## 2021-10-04 DIAGNOSIS — M792 Neuralgia and neuritis, unspecified: Secondary | ICD-10-CM | POA: Diagnosis not present

## 2021-10-04 DIAGNOSIS — Z1159 Encounter for screening for other viral diseases: Secondary | ICD-10-CM | POA: Diagnosis not present

## 2021-10-06 DIAGNOSIS — I480 Paroxysmal atrial fibrillation: Secondary | ICD-10-CM | POA: Diagnosis not present

## 2021-10-06 DIAGNOSIS — Z79899 Other long term (current) drug therapy: Secondary | ICD-10-CM | POA: Diagnosis not present

## 2021-10-06 DIAGNOSIS — N183 Chronic kidney disease, stage 3 unspecified: Secondary | ICD-10-CM | POA: Diagnosis not present

## 2021-10-06 DIAGNOSIS — Z23 Encounter for immunization: Secondary | ICD-10-CM | POA: Diagnosis not present

## 2021-10-06 DIAGNOSIS — B0229 Other postherpetic nervous system involvement: Secondary | ICD-10-CM | POA: Diagnosis not present

## 2021-10-06 DIAGNOSIS — E785 Hyperlipidemia, unspecified: Secondary | ICD-10-CM | POA: Diagnosis not present

## 2021-10-06 DIAGNOSIS — D6869 Other thrombophilia: Secondary | ICD-10-CM | POA: Diagnosis not present

## 2021-10-06 DIAGNOSIS — I129 Hypertensive chronic kidney disease with stage 1 through stage 4 chronic kidney disease, or unspecified chronic kidney disease: Secondary | ICD-10-CM | POA: Diagnosis not present

## 2021-10-06 DIAGNOSIS — M8588 Other specified disorders of bone density and structure, other site: Secondary | ICD-10-CM | POA: Diagnosis not present

## 2021-10-06 DIAGNOSIS — R5382 Chronic fatigue, unspecified: Secondary | ICD-10-CM | POA: Diagnosis not present

## 2021-10-26 ENCOUNTER — Other Ambulatory Visit: Payer: Self-pay | Admitting: Cardiology

## 2021-10-28 ENCOUNTER — Telehealth: Payer: Self-pay | Admitting: Cardiology

## 2021-10-28 ENCOUNTER — Other Ambulatory Visit: Payer: Self-pay | Admitting: *Deleted

## 2021-10-28 MED ORDER — FLECAINIDE ACETATE 50 MG PO TABS: 50.0000 mg | ORAL_TABLET | Freq: Every day | ORAL | 0 refills | Status: DC

## 2021-10-28 NOTE — Telephone Encounter (Signed)
 *  STAT* If patient is at the pharmacy, call can be transferred to refill team.   1. Which medications need to be refilled? (please list name of each medication and dose if known) flecainide (TAMBOCOR) 50 MG tablet  2. Which pharmacy/location (including street and city if local pharmacy) is medication to be sent to? [place], [place] AT [place]  3. Do they need a 30 day or 90 day supply? 90 days

## 2021-11-04 ENCOUNTER — Other Ambulatory Visit (HOSPITAL_BASED_OUTPATIENT_CLINIC_OR_DEPARTMENT_OTHER): Payer: Self-pay | Admitting: Cardiology

## 2021-11-18 ENCOUNTER — Other Ambulatory Visit: Payer: Self-pay | Admitting: Family Medicine

## 2021-11-18 DIAGNOSIS — I77819 Aortic ectasia, unspecified site: Secondary | ICD-10-CM

## 2021-12-02 ENCOUNTER — Ambulatory Visit
Admission: RE | Admit: 2021-12-02 | Discharge: 2021-12-02 | Disposition: A | Discharge status: Home / Self Care | Payer: Medicare PPO | Source: Ambulatory Visit | Attending: Family Medicine | Admitting: Family Medicine

## 2021-12-02 DIAGNOSIS — I77819 Aortic ectasia, unspecified site: Secondary | ICD-10-CM | POA: Diagnosis not present

## 2021-12-02 DIAGNOSIS — J984 Other disorders of lung: Secondary | ICD-10-CM | POA: Diagnosis not present

## 2021-12-02 DIAGNOSIS — S2220XA Unspecified fracture of sternum, initial encounter for closed fracture: Secondary | ICD-10-CM | POA: Diagnosis not present

## 2021-12-02 DIAGNOSIS — H6121 Impacted cerumen, right ear: Secondary | ICD-10-CM | POA: Diagnosis not present

## 2021-12-02 DIAGNOSIS — I2510 Atherosclerotic heart disease of native coronary artery without angina pectoris: Secondary | ICD-10-CM | POA: Diagnosis not present

## 2021-12-02 MED ORDER — IOPAMIDOL (ISOVUE-370) INJECTION 76%
60.0000 mL | Freq: Once | INTRAVENOUS | Status: AC | PRN
  Administered 2021-12-02: 60 mL via INTRAVENOUS

## 2021-12-14 ENCOUNTER — Ambulatory Visit: Payer: Medicare PPO | Attending: Cardiology | Admitting: Cardiology

## 2021-12-14 VITALS — BP 142/76 | HR 64 | Ht 64.0 in | Wt 145.4 lb

## 2021-12-14 DIAGNOSIS — I10 Essential (primary) hypertension: Secondary | ICD-10-CM | POA: Diagnosis not present

## 2021-12-14 DIAGNOSIS — I480 Paroxysmal atrial fibrillation: Secondary | ICD-10-CM | POA: Diagnosis not present

## 2021-12-14 DIAGNOSIS — N1831 Chronic kidney disease, stage 3a: Secondary | ICD-10-CM | POA: Diagnosis not present

## 2021-12-14 NOTE — Progress Notes (Signed)
Cardiology Office Note:    Date:  12/14/2021   ID:  Cheryl [name], DOB [birth year], MRN [id number]  PCP:  Laurann Montana, MD   [facility] Providers Cardiologist:  Donato Schultz, MD     Referring MD: Laurann Montana, MD     History of Present Illness:    Cheryl [name] is a 87 y.o. female pronounced Cheryl [name], here for follow-up of atrial fibrillation on flecainide.  Previously refused anticoagulation despite counseling on stroke.  She is now taking Eliquis.  Excellent.  Sunday night had AFIB. Took another flecainide.  Atrial fibrillation was 30-45 min in total. Wiggling.   Taking metoprolol 50am and 50pm. HR 46, we will decrease to 25/25.  Has an LDL of 211 in 2013 did not wish to take statins.  She is now taking Crestor twice a week.  Excellent.  Has had issues with hyponatremia on HCTZ.  She is off of this medication.  Overall having no fevers chills nausea vomiting syncope bleeding.  Her husband has been battling worsening dementia for several years.  Today, she says she is doing pretty good.   She states that every now and then she experiences some palpitations, described as a skipping sensation. During these times, she endorses that sometimes she has to take a sudden deep breath.   Additionally, she mentions that she experiences some LE edema, particularly in her left ankle. She states that this swelling is much worse in the evenings.    She reports that she has seen improvement with her blood pressure since taking her metoprolol both in the morning and at night.   In regards to exercise, she says that she lives a very sedentary lifestyle.   Her husband passed away at the end of 08-03-2023after 62 years of marriage. She has been dealing with his passing and living alone now, which is highly unusual for her. She states that she has never lived alone before. Her children have been helping her out. She is hoping to become more involved in her church community and make some new  friends.   She denies any chest pain or shortness of breath. No lightheadedness, headaches, syncope, orthopnea, or PND.   Past Medical History:  Diagnosis Date   Arthritis    CAD (coronary artery disease)    Mild per remote cath in 2004; normal nuclear in 2012   Chronic kidney disease    stage3 kidney disease    Complication of anesthesia    slow to wake up   Diastolic dysfunction    Grade I, per echo in 2012   Dysrhythmia    GERD (gastroesophageal reflux disease)    prevacid   H/O hiatal hernia    Headache(784.0)    sinus   High risk medication use    Flecainide therapy   History of heartburn    Hyperlipidemia    Hypertension    LVH (left ventricular hypertrophy)    Per echo in 09-09-10  Normal nuclear stress test 09-09-10  Obesity    PAF (paroxysmal atrial fibrillation) (HCC)    Pneumonia    20 yrs ago   Syncope 09-09-2010   Past Surgical History:  Procedure Laterality Date   APPENDECTOMY  1952   BACK SURGERY  1980, 2010   x2   CARDIAC CATHETERIZATION  07/28/2002   EF GREATER THAN 55%; MILD CAD   COLONOSCOPY     DILATION AND CURETTAGE OF UTERUS     LUMBAR  LAMINECTOMY/DECOMPRESSION MICRODISCECTOMY Right 02/15/2015   Procedure: Right Thoracic twelve-Lumbar one  Microdiskectomy;  Surgeon: Tressie Stalker, MD;  Location: [facility];  Service: Neurosurgery;  Laterality: Right;  Thoracic/Lumbar   TONSILLECTOMY     age 53 yrs   TUBAL LIGATION      Current Medications: Current Meds  Medication Sig   apixaban (ELIQUIS) 5 MG TABS tablet TAKE 1 TABLET BY MOUTH TWICE DAILY   Biotin 10 MG CAPS Take 10 mg by mouth daily.   Cholecalciferol (VITAMIN D) 2000 UNITS tablet Take 2,000 Units by mouth daily.     cyclobenzaprine (FLEXERIL) 5 MG tablet Take 1 tablet (5 mg total) by mouth 3 (three) times daily as needed for muscle spasms.   docusate sodium (COLACE) 100 MG capsule Take 1 capsule (100 mg total) by mouth 2 (two) times daily.   DULoxetine (CYMBALTA) 20 MG capsule  Take 20 mg by mouth daily.   famotidine (PEPCID) 20 MG tablet Take 20 mg by mouth at bedtime as needed.   flecainide (TAMBOCOR) 50 MG tablet Take 1 tablet (50 mg total) by mouth daily. Please keep upcoming appointment in November 2023 for future refills. Thank you   HYDROcodone-acetaminophen (NORCO/VICODIN) 5-325 MG tablet Take 1 tablet by mouth 3 (three) times daily as needed.   loratadine (CLARITIN) 10 MG tablet Take 10 mg by mouth daily as needed for allergies.   Magnesium 200 MG TABS Take 200 mg by mouth daily.   metoprolol tartrate (LOPRESSOR) 25 MG tablet TAKE 1 TABLET(25 MG) BY MOUTH TWICE DAILY   rosuvastatin (CRESTOR) 10 MG tablet Take 10 mg by mouth 2 (two) times a week.   traZODone (DESYREL) 100 MG tablet Take 1 tablet (100 mg total) by mouth at bedtime as needed for sleep.     Allergies:   Codeine phosphate, Lipitor [atorvastatin calcium], and Sulfonamide derivatives   Social History   Socioeconomic History   Marital status: Widowed    Spouse name: Not on file   Number of children: Not on file   Years of education: Not on file   Highest education level: Not on file  Occupational History   Not on file  Tobacco Use   Smoking status: Never   Smokeless tobacco: Never  Substance and Sexual Activity   Alcohol use: No   Drug use: No   Sexual activity: Not on file  Other Topics Concern   Not on file  Social History Narrative   Not on file   Social Determinants of Health   Financial Resource Strain: Not on file  Food Insecurity: Not on file  Transportation Needs: Not on file  Physical Activity: Not on file  Stress: Not on file  Social Connections: Not on file     Family History: The patient's family history includes Lung cancer in her father; Stroke in her paternal grandmother.  ROS:   Please see the history of present illness.    (+) Palpitations   (+) Bilateral LE edema worse in left ankle All other systems reviewed and are negative.  EKGs/Labs/Other Studies  Reviewed:    The following studies were reviewed today:  CTA Chest Aorta 12/02/2021: IMPRESSION: 1. No evidence for aortic aneurysm or dissection. 2. New pulmonary nodules. Most significant: 8 mm right solid pulmonary nodule within the upper lobe. Per Fleischner Society Guidelines, recommend a non-contrast Chest CT at 3-6 months, then consider another non-contrast Chest CT at 18-24 months. If patient is low risk for malignancy, non-contrast Chest CT at 18-24 months is  optional. These guidelines do not apply to immunocompromised patients and patients with cancer. Follow up in patients with significant comorbidities as clinically warranted. For lung cancer screening, adhere to Lung-RADS guidelines. Reference: Radiology. 2017; 284(1):228-43. 3. Small cluster of tree-in-bud opacities in the posterior left upper lobe are likely infectious/inflammatory. 4. Chronic sternal fracture with nonunion is new from 2022.   Aortic Atherosclerosis (ICD10-I70.0).   Bilateral Carotid Doppler 04/08/2019: IMPRESSION: 1. Mild (1-49%) stenosis proximal right internal carotid artery secondary to minimal heterogeneous atherosclerotic plaque. 2. Mild (1-49%) stenosis proximal left internal carotid artery secondary to minimal heterogeneous atherosclerotic plaque. 3. Known bilateral distal internal carotid and vertebral artery fibromuscular dysplasia is not evident sonographically. Please see previously obtained CTA of the neck dated 11/10/2017. 4. The vertebral arteries are patent with normal antegrade flow.   ECHO 2019:  - Left ventricle: The cavity size was normal. Wall thickness was    increased in a pattern of moderate LVH. Systolic function was    normal. The estimated ejection fraction was in the range of 55%    to 60%. Wall motion was normal; there were no regional wall    motion abnormalities. Doppler parameters are consistent with    abnormal left ventricular relaxation (grade 1 diastolic     dysfunction).  - Aortic valve: There was no stenosis.  - Mitral valve: There was trivial regurgitation.  - Left atrium: The atrium was mildly dilated.  - Right ventricle: The cavity size was normal. Systolic function    was normal.  - Pulmonary arteries: PA peak pressure: 24 mm Hg (S).  - Inferior vena cava: The vessel was normal in size. The    respirophasic diameter changes were in the normal range (>= 50%),    consistent with normal central venous pressure.   Echocardiogram 2012 - Moderate LVH EF 55 to 65%.  Mild mitral regurgitation with systolic anterior motion.  PA pressure 32.   EKG: EKG is personally reviewed. 12/14/21: Sinus rhythm. Rate 64 bpm. LAFB. Poor R wave progression. 09/09/20: Sinus bradycardia 46 bpm left anterior fascicular block poor R wave progression nonspecific ST-T wave changes.  Recent Labs: No results found for requested labs within last 365 days.  Recent Lipid Panel    Component Value Date/Time   CHOL 241 (H) 11/11/2017 0914   TRIG 61 11/11/2017 0914   HDL 55 11/11/2017 0914   CHOLHDL 4.4 11/11/2017 0914   VLDL 12 11/11/2017 0914   LDLCALC 174 (H) 11/11/2017 0914   LDLDIRECT 154.7 07/13/2010 0834     Risk Assessment/Calculations:    CHA2DS2-VASc Score =    This indicates a  % annual risk of stroke. The patient's score is based upon:           Physical Exam:    VS:  BP (!) 142/76   Pulse 64   Ht 5\' 4"  (1.626 m)   Wt 145 lb 6.4 oz (66 kg)   SpO2 92%   BMI 24.96 kg/m     Wt Readings from Last 3 Encounters:  12/14/21 145 lb 6.4 oz (66 kg)  09/09/20 137 lb 6.4 oz (62.3 kg)  11/25/19 135 lb (61.2 kg)     GEN:  Well nourished, well developed in no acute distress HEENT: Normal NECK: No JVD; No carotid bruits LYMPHATICS: No lymphadenopathy CARDIAC: brady reg, no murmurs, rubs, gallops RESPIRATORY:  Clear to auscultation without rales, wheezing or rhonchi  ABDOMEN: Soft, non-tender, non-distended MUSCULOSKELETAL:  trace bilateral LE  edema worse in left; No deformity  SKIN: Warm and dry NEUROLOGIC:  Alert and oriented x 3 PSYCHIATRIC:  Normal affect   ASSESSMENT:    1. PAF (paroxysmal atrial fibrillation) (HCC)   2. Medication management   3. Essential hypertension   4. Stage 3a chronic kidney disease (HCC)    PLAN:    In order of problems listed above:  Paroxysmal atrial fibrillation - Have had lengthy discussions about the potential for stroke risk.  Thankfully she is now on Eliquis.   -She is on flecainide, unconventional dose once daily.  She likes to take it more in the nighttime.  She says he did not do well with twice daily.  During her recent episode of irregular heartbeat she took an additional flecainide and this worked. - Decrease metoprolol to 25 mg twice a day.  Her heart rate on resting EKG is 46 bpm with left anterior fascicular block. - For hypertension her ARB or amlodipine did not work well with her.  Chronic anticoagulation - Continue with Eliquis.  Minor nuisance ecchymosis on her forearm.  Checking CBC and creatinine.  If creatinine is greater than 1.5 given her age greater than 2380, we will dose adjust Eliquis to 2.5 mg twice a day.  Essential hypertension - Cannot take hydralazine ARB or amlodipine.  Currently on metoprolol.  No changes made.  Avoid HCTZ which can cause hyponatremia.  Hyperlipidemia - Was taking Crestor 10 mg 2 times a week.  Had a mild stroke in the past.  LDL was 211 in 2013, most recently 117 outside labs.   Chronic kidney disease stage IIIa - GFR in the 50% range.  Avoiding NSAIDs.  Most recent creatinine 1.4   Pulmonary hypertension - Previously seen on echocardiogram with pulmonary pressures of 62 mmHg.  No evidence of thromboembolic disease on CT scan of chest.  No shortness of breath.  Continue to treat hypertension.  Lost her husband in June 2023 after 62 years of marriage.  He had Alzheimer's.  Follow up: 6 months with APP, 1 year with Dr.  Anne FuSkains  Medication Adjustments/Labs and Tests Ordered: Current medicines are reviewed at length with the patient today.  Concerns regarding medicines are outlined above.  Orders Placed This Encounter  Procedures   EKG 12-Lead    No orders of the defined types were placed in this encounter.   Patient Instructions  Medication Instructions:  Your physician recommends that you continue on your current medications as directed. Please refer to the Current Medication list given to you today.  *If you need a refill on your cardiac medications before your next appointment, please call your pharmacy*   Follow-Up: At [facility], you and your health needs are our priority.  As part of our continuing mission to provide you with exceptional heart care, we have created designated Provider Care Teams.  These Care Teams include your primary Cardiologist (physician) and Advanced Practice Providers (APPs -  Physician Assistants and Nurse Practitioners) who all work together to provide you with the care you need, when you need it.     Your next appointment:   6 month(s)  The format for your next appointment:   In Person  Provider:   APP    Then, Donato SchultzMark Karinne Schmader, MD will plan to see you again in 1 year(s).             I,Breanna Adamick,acting as a scribe for Coca ColaMark Naela Nodal, MD.,have documented all relevant documentation on the behalf of Donato SchultzMark Haidyn Chadderdon, MD,as directed by  Donato SchultzMark Daphne Karrer,  MD while in the presence of Donato Schultz, MD.  I, Donato Schultz, MD, have reviewed all documentation for this visit. The documentation on 12/14/21 for the exam, diagnosis, procedures, and orders are all accurate and complete.   Signed, Donato Schultz, MD  12/14/2021 9:50 AM    [facility]

## 2021-12-14 NOTE — Patient Instructions (Signed)
Medication Instructions:  Your physician recommends that you continue on your current medications as directed. Please refer to the Current Medication list given to you today.  *If you need a refill on your cardiac medications before your next appointment, please call your pharmacy*   Follow-Up: At [facility], you and your health needs are our priority.  As part of our continuing mission to provide you with exceptional heart care, we have created designated Provider Care Teams.  These Care Teams include your primary Cardiologist (physician) and Advanced Practice Providers (APPs -  Physician Assistants and Nurse Practitioners) who all work together to provide you with the care you need, when you need it.     Your next appointment:   6 month(s)  The format for your next appointment:   In Person  Provider:   APP    Then, Donato Schultz, MD will plan to see you again in 1 year(s).

## 2021-12-19 ENCOUNTER — Other Ambulatory Visit: Payer: Self-pay | Admitting: Cardiology

## 2021-12-24 ENCOUNTER — Other Ambulatory Visit: Payer: Self-pay | Admitting: Cardiology

## 2021-12-26 NOTE — Telephone Encounter (Signed)
Prescription refill request for Eliquis received. Indication:afib Last office visit:11/23 Scr:1.3 Age: 87 Weight:66  kg  Prescription refilled

## 2022-01-12 DIAGNOSIS — Z9181 History of falling: Secondary | ICD-10-CM | POA: Diagnosis not present

## 2022-01-12 DIAGNOSIS — I10 Essential (primary) hypertension: Secondary | ICD-10-CM | POA: Diagnosis not present

## 2022-01-12 DIAGNOSIS — N1831 Chronic kidney disease, stage 3a: Secondary | ICD-10-CM | POA: Diagnosis not present

## 2022-01-12 DIAGNOSIS — K219 Gastro-esophageal reflux disease without esophagitis: Secondary | ICD-10-CM | POA: Diagnosis not present

## 2022-01-12 DIAGNOSIS — E663 Overweight: Secondary | ICD-10-CM | POA: Diagnosis not present

## 2022-01-12 DIAGNOSIS — I4891 Unspecified atrial fibrillation: Secondary | ICD-10-CM | POA: Diagnosis not present

## 2022-01-12 DIAGNOSIS — M48061 Spinal stenosis, lumbar region without neurogenic claudication: Secondary | ICD-10-CM | POA: Diagnosis not present

## 2022-01-12 DIAGNOSIS — K5900 Constipation, unspecified: Secondary | ICD-10-CM | POA: Diagnosis not present

## 2022-01-12 DIAGNOSIS — M5136 Other intervertebral disc degeneration, lumbar region: Secondary | ICD-10-CM | POA: Diagnosis not present

## 2022-02-15 ENCOUNTER — Other Ambulatory Visit: Payer: Self-pay | Admitting: Cardiology

## 2022-02-15 DIAGNOSIS — I480 Paroxysmal atrial fibrillation: Secondary | ICD-10-CM

## 2022-02-15 NOTE — Telephone Encounter (Signed)
Prescription refill request for Eliquis received. Indication: a fib Last office visit: 12/14/21 Scr: 1.35 Age: 88 Weight: 66kg

## 2022-03-14 ENCOUNTER — Other Ambulatory Visit: Payer: Self-pay | Admitting: Family Medicine

## 2022-03-14 DIAGNOSIS — R911 Solitary pulmonary nodule: Secondary | ICD-10-CM

## 2022-03-31 DIAGNOSIS — E559 Vitamin D deficiency, unspecified: Secondary | ICD-10-CM | POA: Diagnosis not present

## 2022-03-31 DIAGNOSIS — Z9181 History of falling: Secondary | ICD-10-CM | POA: Diagnosis not present

## 2022-03-31 DIAGNOSIS — M5136 Other intervertebral disc degeneration, lumbar region: Secondary | ICD-10-CM | POA: Diagnosis not present

## 2022-03-31 DIAGNOSIS — E663 Overweight: Secondary | ICD-10-CM | POA: Diagnosis not present

## 2022-03-31 DIAGNOSIS — I10 Essential (primary) hypertension: Secondary | ICD-10-CM | POA: Diagnosis not present

## 2022-03-31 DIAGNOSIS — M25552 Pain in left hip: Secondary | ICD-10-CM | POA: Diagnosis not present

## 2022-03-31 DIAGNOSIS — Z79899 Other long term (current) drug therapy: Secondary | ICD-10-CM | POA: Diagnosis not present

## 2022-03-31 DIAGNOSIS — M129 Arthropathy, unspecified: Secondary | ICD-10-CM | POA: Diagnosis not present

## 2022-03-31 DIAGNOSIS — M25562 Pain in left knee: Secondary | ICD-10-CM | POA: Diagnosis not present

## 2022-03-31 DIAGNOSIS — Z0000 Encounter for general adult medical examination without abnormal findings: Secondary | ICD-10-CM | POA: Diagnosis not present

## 2022-03-31 DIAGNOSIS — Z131 Encounter for screening for diabetes mellitus: Secondary | ICD-10-CM | POA: Diagnosis not present

## 2022-03-31 DIAGNOSIS — M48061 Spinal stenosis, lumbar region without neurogenic claudication: Secondary | ICD-10-CM | POA: Diagnosis not present

## 2022-04-06 ENCOUNTER — Other Ambulatory Visit: Payer: Self-pay | Admitting: Family Medicine

## 2022-04-06 DIAGNOSIS — M8580 Other specified disorders of bone density and structure, unspecified site: Secondary | ICD-10-CM

## 2022-04-06 DIAGNOSIS — I480 Paroxysmal atrial fibrillation: Secondary | ICD-10-CM | POA: Diagnosis not present

## 2022-04-06 DIAGNOSIS — Z0000 Encounter for general adult medical examination without abnormal findings: Secondary | ICD-10-CM | POA: Diagnosis not present

## 2022-04-06 DIAGNOSIS — N183 Chronic kidney disease, stage 3 unspecified: Secondary | ICD-10-CM | POA: Diagnosis not present

## 2022-04-06 DIAGNOSIS — I129 Hypertensive chronic kidney disease with stage 1 through stage 4 chronic kidney disease, or unspecified chronic kidney disease: Secondary | ICD-10-CM | POA: Diagnosis not present

## 2022-04-06 DIAGNOSIS — B0229 Other postherpetic nervous system involvement: Secondary | ICD-10-CM | POA: Diagnosis not present

## 2022-04-06 DIAGNOSIS — E785 Hyperlipidemia, unspecified: Secondary | ICD-10-CM | POA: Diagnosis not present

## 2022-04-06 DIAGNOSIS — I69352 Hemiplegia and hemiparesis following cerebral infarction affecting left dominant side: Secondary | ICD-10-CM | POA: Diagnosis not present

## 2022-04-06 DIAGNOSIS — D6869 Other thrombophilia: Secondary | ICD-10-CM | POA: Diagnosis not present

## 2022-04-06 DIAGNOSIS — I77819 Aortic ectasia, unspecified site: Secondary | ICD-10-CM | POA: Diagnosis not present

## 2022-04-11 ENCOUNTER — Ambulatory Visit
Admission: RE | Admit: 2022-04-11 | Discharge: 2022-04-11 | Disposition: A | Discharge status: Home / Self Care | Payer: Medicare PPO | Source: Ambulatory Visit | Attending: Family Medicine | Admitting: Family Medicine

## 2022-04-11 DIAGNOSIS — R911 Solitary pulmonary nodule: Secondary | ICD-10-CM | POA: Diagnosis not present

## 2022-04-11 DIAGNOSIS — I700 Atherosclerosis of aorta: Secondary | ICD-10-CM | POA: Diagnosis not present

## 2022-04-29 ENCOUNTER — Other Ambulatory Visit: Payer: Self-pay | Admitting: Cardiology

## 2022-06-01 DIAGNOSIS — K5900 Constipation, unspecified: Secondary | ICD-10-CM | POA: Diagnosis not present

## 2022-06-01 DIAGNOSIS — M48061 Spinal stenosis, lumbar region without neurogenic claudication: Secondary | ICD-10-CM | POA: Diagnosis not present

## 2022-06-01 DIAGNOSIS — I4891 Unspecified atrial fibrillation: Secondary | ICD-10-CM | POA: Diagnosis not present

## 2022-06-01 DIAGNOSIS — Z9181 History of falling: Secondary | ICD-10-CM | POA: Diagnosis not present

## 2022-06-01 DIAGNOSIS — K219 Gastro-esophageal reflux disease without esophagitis: Secondary | ICD-10-CM | POA: Diagnosis not present

## 2022-06-01 DIAGNOSIS — Z79899 Other long term (current) drug therapy: Secondary | ICD-10-CM | POA: Diagnosis not present

## 2022-06-01 DIAGNOSIS — M25562 Pain in left knee: Secondary | ICD-10-CM | POA: Diagnosis not present

## 2022-06-01 DIAGNOSIS — M5136 Other intervertebral disc degeneration, lumbar region: Secondary | ICD-10-CM | POA: Diagnosis not present

## 2022-06-01 DIAGNOSIS — M25552 Pain in left hip: Secondary | ICD-10-CM | POA: Diagnosis not present

## 2022-06-01 DIAGNOSIS — I10 Essential (primary) hypertension: Secondary | ICD-10-CM | POA: Diagnosis not present

## 2022-06-05 DIAGNOSIS — Z79899 Other long term (current) drug therapy: Secondary | ICD-10-CM | POA: Diagnosis not present

## 2022-08-29 DIAGNOSIS — E663 Overweight: Secondary | ICD-10-CM | POA: Diagnosis not present

## 2022-08-29 DIAGNOSIS — K219 Gastro-esophageal reflux disease without esophagitis: Secondary | ICD-10-CM | POA: Diagnosis not present

## 2022-08-29 DIAGNOSIS — Z760 Encounter for issue of repeat prescription: Secondary | ICD-10-CM | POA: Diagnosis not present

## 2022-08-29 DIAGNOSIS — M48061 Spinal stenosis, lumbar region without neurogenic claudication: Secondary | ICD-10-CM | POA: Diagnosis not present

## 2022-08-29 DIAGNOSIS — Z9181 History of falling: Secondary | ICD-10-CM | POA: Diagnosis not present

## 2022-08-29 DIAGNOSIS — K5900 Constipation, unspecified: Secondary | ICD-10-CM | POA: Diagnosis not present

## 2022-08-29 DIAGNOSIS — I10 Essential (primary) hypertension: Secondary | ICD-10-CM | POA: Diagnosis not present

## 2022-08-29 DIAGNOSIS — N1831 Chronic kidney disease, stage 3a: Secondary | ICD-10-CM | POA: Diagnosis not present

## 2022-08-29 DIAGNOSIS — Z79899 Other long term (current) drug therapy: Secondary | ICD-10-CM | POA: Diagnosis not present

## 2022-08-31 DIAGNOSIS — Z79899 Other long term (current) drug therapy: Secondary | ICD-10-CM | POA: Diagnosis not present

## 2022-09-11 ENCOUNTER — Other Ambulatory Visit: Payer: Self-pay | Admitting: Cardiology

## 2022-09-13 ENCOUNTER — Telehealth: Payer: Self-pay

## 2022-09-13 ENCOUNTER — Ambulatory Visit: Payer: Medicare PPO | Admitting: Physician Assistant

## 2022-09-13 ENCOUNTER — Other Ambulatory Visit (INDEPENDENT_AMBULATORY_CARE_PROVIDER_SITE_OTHER): Payer: Medicare PPO

## 2022-09-13 ENCOUNTER — Encounter: Payer: Self-pay | Admitting: Physician Assistant

## 2022-09-13 ENCOUNTER — Other Ambulatory Visit: Payer: Self-pay

## 2022-09-13 VITALS — Ht 63.5 in | Wt 145.0 lb

## 2022-09-13 DIAGNOSIS — M25562 Pain in left knee: Secondary | ICD-10-CM

## 2022-09-13 DIAGNOSIS — M25561 Pain in right knee: Secondary | ICD-10-CM

## 2022-09-13 DIAGNOSIS — G8929 Other chronic pain: Secondary | ICD-10-CM | POA: Diagnosis not present

## 2022-09-13 NOTE — Telephone Encounter (Signed)
VOB submitted for Monovisc, bilateral knee.  

## 2022-09-13 NOTE — Progress Notes (Signed)
Office Visit Note   Patient: Cheryl [name]           Date of Birth: 1934/03/01           MRN: [id number] Visit Date: 09/13/2022              Requested by: Laurann Montana, MD [id number] [place],  [place] [address] PCP: Laurann Montana, MD  Chief Complaint  Patient presents with   Right Knee - Pain   Left Knee - Pain      HPI: Cheryl [name] is a pleasant 88 year old woman who is a former patient of Dr. Hoy Register.  She has a history of right knee arthritis.  She comes in with her daughter today because she has had some increasing bilateral knee pain.  She denies any injury any fever or chills.  She cannot take much ibuprofen because she has some kidney issues but only takes it when she needs it.  She has had steroid injections before but is very sensitive to it and it causes even from injections her to have nausea and vomiting.  Rates her pain as moderate  Assessment & Plan: Visit Diagnoses:  1. Chronic pain of both knees     Plan: Right greater than left knee pain we talked about the natural history of this.  Certainly she has had some progression of her arthritis radiographically.  She not interested in surgery.  We discussed trying a course of PT and her daughter is interested in trying gel injections we talked about the pluses and minuses of this.  We will place referrals for both This patient is diagnosed with osteoarthritis of the knee(s).    Radiographs show evidence of joint space narrowing, osteophytes, subchondral sclerosis and/or subchondral cysts.  This patient has knee pain which interferes with functional and activities of daily living.    This patient has experienced inadequate response, adverse effects and/or intolerance with conservative treatments such as acetaminophen, NSAIDS, topical creams, physical therapy or regular exercise, knee bracing and/or weight loss.   This patient has experienced inadequate response or has a contraindication to intra articular  steroid injections for at least 3 months.   This patient is not scheduled to have a total knee replacement within 6 months of starting treatment with viscosupplementation.   Follow-Up Instructions: No follow-ups on file.   Ortho Exam  Patient is alert, oriented, no adenopathy, well-dressed, normal affect, normal respiratory effort. Bilateral knees no effusion no erythema compartments are soft and nontender she has good strength in her lower extremities she ambulates with a cane for balance globally tender over the medial greater than lateral joint lines.  Positive grinding with range of motion  Imaging: No results found. No images are attached to the encounter.  Labs: Lab Results  Component Value Date   HGBA1C 5.5 11/11/2017   REPTSTATUS 02/27/2017 FINAL 02/23/2017   GRAMSTAIN  02/23/2017    WBC PRESENT, PREDOMINANTLY MONONUCLEAR NO ORGANISMS SEEN CYTOSPIN SMEAR    CULT NO GROWTH 3 DAYS 02/23/2017     Lab Results  Component Value Date   ALBUMIN 3.8 11/10/2017   ALBUMIN 3.8 03/04/2015   ALBUMIN 3.8 03/04/2015    Lab Results  Component Value Date   MG 1.8 11/13/2017   No results found for: "VD25OH"  No results found for: "PREALBUMIN"    Latest Ref Rng & Units 09/14/2020    2:04 PM 11/13/2017    3:07 AM 11/10/2017   10:40 AM  CBC EXTENDED  WBC 3.4 - 10.8 x10E3/uL 4.6  8.0    RBC 3.77 - 5.28 x10E6/uL 4.02  4.24    Hemoglobin 11.1 - 15.9 g/dL 13.2  44.0  10.2   HCT 34.0 - 46.6 % 37.7  39.8  38.0   Platelets 150 - 450 x10E3/uL 238  252       Body mass index is 25.28 kg/m.  Orders:  Orders Placed This Encounter  Procedures   XR Knee 1-2 Views Right   XR Knee 1-2 Views Left   No orders of the defined types were placed in this encounter.    Procedures: No procedures performed  Clinical Data: No additional findings.  ROS:  All other systems negative, except as noted in the HPI. Review of Systems  Objective: Vital Signs: Ht 5' 3.5" (1.613 m)   Wt 145  lb (65.8 kg)   BMI 25.28 kg/m   Specialty Comments:  No specialty comments available.  PMFS History: Patient Active Problem List   Diagnosis Date Noted   Right knee DJD 11/25/2019   Chondrocalcinosis due to dicalcium phosphate crystals, of the knee, right 11/25/2019   Acute CVA (cerebrovascular accident) (HCC) 11/10/2017   Hypertensive emergency 11/10/2017   S/P insertion of spinal cord stimulator 10/11/2017   Chronic pain syndrome 10/11/2017   Post laminectomy syndrome 10/11/2017   Radiculopathy, lumbar region 10/11/2017   Headache 02/23/2017   Nausea & vomiting 02/23/2017   Hyponatremia 02/23/2017   Hypertensive urgency 02/23/2017   Lumbar herniated disc 02/15/2015   CKD (chronic kidney disease) stage 3, GFR 30-59 ml/min (HCC) 06/22/2014   Spondylolisthesis of lumbar region 08/07/2013   Hyperlipidemia 06/13/2008   PAF (paroxysmal atrial fibrillation) (HCC) 06/13/2008   Essential hypertension 11/05/2006   Coronary atherosclerosis 11/05/2006   Past Medical History:  Diagnosis Date   Arthritis    CAD (coronary artery disease)    Mild per remote cath in 2004; normal nuclear in 2012   Chronic kidney disease    stage3 kidney disease    Complication of anesthesia    slow to wake up   Diastolic dysfunction    Grade I, per echo in 2012   Dysrhythmia    GERD (gastroesophageal reflux disease)    prevacid   H/O hiatal hernia    Headache(784.0)    sinus   High risk medication use    Flecainide therapy   History of heartburn    Hyperlipidemia    Hypertension    LVH (left ventricular hypertrophy)    Per echo in July 2012   Normal nuclear stress test July 2012   Obesity    PAF (paroxysmal atrial fibrillation) (HCC)    Pneumonia    20 yrs ago   Syncope July 2012    Family History  Problem Relation Age of Onset   Lung cancer Father    Stroke Paternal Grandmother     Past Surgical History:  Procedure Laterality Date   APPENDECTOMY  1952   BACK SURGERY  1980, 2010    x2   CARDIAC CATHETERIZATION  07/28/2002   EF GREATER THAN 55%; MILD CAD   COLONOSCOPY     DILATION AND CURETTAGE OF UTERUS     LUMBAR LAMINECTOMY/DECOMPRESSION MICRODISCECTOMY Right 02/15/2015   Procedure: Right Thoracic twelve-Lumbar one  Microdiskectomy;  Surgeon: Tressie Stalker, MD;  Location: [facility];  Service: Neurosurgery;  Laterality: Right;  Thoracic/Lumbar   TONSILLECTOMY     age 73 yrs   TUBAL LIGATION     Social History  Occupational History   Not on file  Tobacco Use   Smoking status: Never   Smokeless tobacco: Never  Substance and Sexual Activity   Alcohol use: No   Drug use: No   Sexual activity: Not on file

## 2022-09-19 ENCOUNTER — Other Ambulatory Visit: Payer: Self-pay | Admitting: Cardiology

## 2022-09-19 DIAGNOSIS — I480 Paroxysmal atrial fibrillation: Secondary | ICD-10-CM

## 2022-09-20 NOTE — Telephone Encounter (Signed)
Pt last saw Dr Anne Fu 12/14/21, last labs 04/06/22 Creat 1.28 at [facility] per [facility], age 88, weight 65.8kg, based on specified criteria pt is on appropriate dosage of Eliquis 5mg  BID for afib.  Will refill rx.

## 2022-09-28 ENCOUNTER — Encounter: Payer: Self-pay | Admitting: Physician Assistant

## 2022-09-28 NOTE — Progress Notes (Signed)
Cardiology Office Note    Date:  10/02/2022  ID:  Cheryl, [name] 06-Sep-1934, MRN [id number] PCP:  Laurann Montana, MD  Cardiologist:  Donato Schultz, MD  Electrophysiologist:  None   Chief Complaint: f/u afib  History of Present Illness: .    Cheryl [name] is a 88 y.o. female with visit-pertinent history of PAF, baseline bradycardia with LAFB, stroke, HLD (managed by PCP), mild nonobstructive CAD by remote cath 2004, mild carotid artery plaque bilaterally 2021 with FMD by CT 2019, cerebral aneurysm, hyponatremia, arthritis, GERD, hiatal hernia, CKD 3b, pulmonary nodules & prior aortic ectasia followed by PCP seen for follow-up.   Prior cath 2004 is reviewed below. Last ischemic assessment was via nuc in 2012 that was normal. In 2019 she had a stroke and was found to have atrial fibrillation. Neurology note also references that CT imaging 2019 was "positive also for bilateral ICA and distal vertebral artery Fibromuscular Dysplasia (FMD). Distal cervical Right ICA involvement including fusiform aneurysmal enlargement of the vessel 7-9 mm diameter. 3. Positive also for a small 2-3 mm aneurysm versus infundibulum of the distal Left Vertebral Artery directed posteriorly as it crosses the dura." She was started on Eliquis. F/u neuro note 2019 stated, "Due to findings of bilateral ICA FMD and right ICA fusiform dilation, recommended to follow-up with Dr. Fatima Sanger outpatient for continued monitoring as IR unable to perform any intervention at this time." As of last neurology OV 2020, they encouraged continue anticoagulation. Carotid US in 2021 suggested that distal internal carotid and vertebral artery fibromuscular dysplasia was not evident sonographically. Last echo 2021 showed EF 55-60%, G1DD, mod LVH, mild LAE. There is mention of prior pulmonary HTN in the chart; PASP by last echo had normalized. At last OV here in 12/2021 her metoprolol dose was reduced to 25mg  BID due to HR 46bpm with LAFB. Dr.  Anne Fu had been aware of the unconventional once-daily flecainide dosing and had continued at last visit since she had done well on this. She has been treated with lower dose statin due to patient preference. Her PCP had been following pulmonary nodules and aortic ectasia, with last CT 04/2022 demonstrating the nodules (recommendation for f/u 12-18 months in high risk but considered optimal in low risk patients) and no aortic aneurysm.  She is seen back for follow-up with her daughter overall doing well. She affirms that her metoprolol and flecainide have been largely effective at present doses. She has very rare breakthrough of atrial fibrillation throughout the year, about once or twice a year, where she has to take an extra flecainide with resolution of symptoms. She also reports that spread out over the last 6 months she's had 3-4 episodes of fleeting transient dizziness upon standing, lasting a few seconds. She feels as though her heart will skip a beat, but will not fully launch into AF. She is asymptomatic today. Abbreviated orthostatics by me were negative without acute BP or HR change.   Labwork independently reviewed: 03/2022 BUN 19, Cr 1.28, Tchol 199, HDL 65, LDL 118, K 4.1, TSH wnl, trig 90 - lipids PCP 2022 Cr 1.33, K 5, Ca 10.6, CBC wnl 2019 Mg 1.8, LDL 174  ROS: .    Please see the history of present illness.  All other systems are reviewed and otherwise negative.  Studies Reviewed: Marland Kitchen    EKG:  EKG is ordered today, personally reviewed, demonstrating SB 53bpm, nonspecific STTW changes, QTc , LAFB, QRS duration - no acute change from  prior, similar STTW changes seen back to 2019  CV Studies: Cardiac studies reviewed are outlined and summarized above. Otherwise please see EMR for full report.  Cath 2004   FINDINGS:  1. Left main trunk:  Medium caliber vessel with moderate irregularities.  2. LAD is a medium caliber vessel that provides two diagonal branches.  The      proximal segment of the LAD has proximal eccentric bend and plaque of 30-     40%.  The distal vessel has moderate irregularities and converts to mild     disease of 30%.  3. Left circumflex artery is a medium caliber vessel which bifurcates into a     marginal branch in the mid section.  The left circumflex system has     moderate irregularities of 20-30%.  4. Right coronary artery is dominant.  This is a medium caliber vessel that     provides a posterior descending artery and a posterior ventricular branch     in the terminal segment.  The right coronary has an anterior takeoff.     There is a focal plaque and bend in the vessel approximately 40-50% in     the proximal segment.  The remainder of the vessel has mild     irregularities.  5. LV:  Normal end-systolic and end-diastolic dimensions.  Overall left     ventricular function is well preserved.  Ejection fraction greater than     55%.  No mitral regurgitation.  LV pressure is 170/10.  Aortic is 170/80.     LVEDP equals 15.  Current Reported Medications:.    Current Meds  Medication Sig   alendronate (FOSAMAX) 70 MG tablet Take 70 mg by mouth once a week.   amLODipine (NORVASC) 5 MG tablet Take 5 mg by mouth daily.   Biotin 10 MG CAPS Take 10 mg by mouth daily.   cyclobenzaprine (FLEXERIL) 5 MG tablet Take 1 tablet (5 mg total) by mouth 3 (three) times daily as needed for muscle spasms.   ELIQUIS 5 MG TABS tablet TAKE 1 TABLET BY MOUTH TWICE DAILY   famotidine (PEPCID) 20 MG tablet Take 20 mg by mouth at bedtime as needed.   flecainide (TAMBOCOR) 50 MG tablet Take 1 tablet (50 mg total) by mouth daily.   HYDROcodone-acetaminophen (NORCO/VICODIN) 5-325 MG tablet Take 1 tablet by mouth 3 (three) times daily as needed.   loratadine (CLARITIN) 10 MG tablet Take 10 mg by mouth daily as needed for allergies.   metoprolol tartrate (LOPRESSOR) 25 MG tablet TAKE 1 TABLET(25 MG) BY MOUTH TWICE DAILY   rosuvastatin (CRESTOR) 10 MG tablet  Take 10 mg by mouth 2 (two) times a week.   traZODone (DESYREL) 100 MG tablet Take 1 tablet (100 mg total) by mouth at bedtime as needed for sleep.    Physical Exam:    VS:  BP 132/80 (BP Location: Left Arm, Patient Position: Sitting, Cuff Size: Normal)   Pulse (!) 53   Ht 5' 3.5" (1.613 m)   Wt 146 lb (66.2 kg)   BMI 25.46 kg/m    Wt Readings from Last 3 Encounters:  10/02/22 146 lb (66.2 kg)  09/13/22 145 lb (65.8 kg)  12/14/21 145 lb 6.4 oz (66 kg)    GEN: Well nourished, well developed in no acute distress NECK: No JVD; No carotid bruits CARDIAC: RRR, no murmurs, rubs, gallops RESPIRATORY:  Clear to auscultation without rales, wheezing or rhonchi  ABDOMEN: Soft, non-tender, non-distended EXTREMITIES:  No  edema; No acute deformity   Asessement and Plan:.    1. Dizziness - rare, transient. This sounds orthostatic in nature but is very infrequent. She denies any pre-syncope or syncope. It is very transient lasting only a few seconds and she is not acutely concerned about it but wanted to mention it. She knows to stand for a few moments before she begins walking. We'll update basic labwork today as well as echocardiogram to ensure normal structure given ongoing flecainide rx. Episodes are so infrequent in such that event monitoring would likely be low yield. I did ask her to check her BP and HR next time this occurs. I'll discuss her medication management with Dr. Anne Fu tomorrow. I also recommended that, given her prior mention of cerebrovascular disease including FMD/aneurysm, that she get back in to see Dr. Corliss Skains to inquire whether repeat angiographic surveillance would be warranted.  2. PAF, LAFB, bradycardia - she is largely maintaining NSR except with rare breakthrough Afib. Dr. Anne Fu had been aware of the unconventional once-daily flecainide dosing and had continued at last visit since she had done well on this. I anticipate we'll continue flecainide and beta blocker, but will  discuss her update with him tomorrow in clinic to help guide any further medication changes or need for updated ischemic testing given her age, prior mild CAD, and overall clinical status. Will get f/u BMET, CBC today to ensure stable to continue present dose of Eliquis.  3. CAD - no recent anginal symptoms. No ASA given concomitant Eliquis. Lipids are managed by PCP.  4. Mild carotid plaque - mild disease by CT 2021. No carotid bruit on examination. As above, I discussed following up with Dr. Corliss Skains given prior commentary regarding aneurysm formation. She also carries hx of FMD that was not seen on last carotid duplex. Continue statin therapy. Lipids are managed by PCP.  Disposition: F/u with Dr. Anne Fu in 6 months.  Addendum: I discussed her episodic dizziness with Dr. Anne Fu who recommends we go ahead and decrease her metoprolol tartrate further to 12.5mg  BID. Will relay in result note that if she doesn't want to cut her pills, OK to transition instead to metoprolol succinate 25mg  once daily (which is equal to the 12.5mg  BID of tartrate). Since we are making this medicine change we'll also plan for a 2 month f/u in addition to 6 month recall. She is aware to seek care if symptoms worsen.   Signed, Laurann Montana, PA-C

## 2022-10-02 ENCOUNTER — Encounter: Payer: Self-pay | Admitting: Physician Assistant

## 2022-10-02 ENCOUNTER — Ambulatory Visit: Payer: Medicare PPO | Attending: Physician Assistant | Admitting: Physician Assistant

## 2022-10-02 VITALS — BP 132/80 | HR 53 | Ht 63.5 in | Wt 146.0 lb

## 2022-10-02 DIAGNOSIS — I2510 Atherosclerotic heart disease of native coronary artery without angina pectoris: Secondary | ICD-10-CM | POA: Diagnosis not present

## 2022-10-02 DIAGNOSIS — E785 Hyperlipidemia, unspecified: Secondary | ICD-10-CM | POA: Diagnosis not present

## 2022-10-02 DIAGNOSIS — I480 Paroxysmal atrial fibrillation: Secondary | ICD-10-CM | POA: Diagnosis not present

## 2022-10-02 DIAGNOSIS — R42 Dizziness and giddiness: Secondary | ICD-10-CM

## 2022-10-02 DIAGNOSIS — I779 Disorder of arteries and arterioles, unspecified: Secondary | ICD-10-CM

## 2022-10-02 NOTE — Patient Instructions (Addendum)
Medication Instructions:  Your physician recommends that you continue on your current medications as directed. Please refer to the Current Medication list given to you today.  *If you need a refill on your cardiac medications before your next appointment, please call your pharmacy*  Lab Work: TODAY: BMET, magnesium, CBC, TSH If you have labs (blood work) drawn today and your tests are completely normal, you will receive your results only by: MyChart Message (if you have MyChart) OR A paper copy in the mail If you have any lab test that is abnormal or we need to change your treatment, we will call you to review the results.  Testing/Procedures: Your physician has requested that you have an echocardiogram. Echocardiography is a painless test that uses sound waves to create images of your heart. It provides your doctor with information about the size and shape of your heart and how well your heart's chambers and valves are working. This procedure takes approximately one hour. There are no restrictions for this procedure. Please do NOT wear cologne, perfume, aftershave, or lotions (deodorant is allowed). Please arrive 15 minutes prior to your appointment time.  Follow-Up: At [facility], you and your health needs are our priority.  As part of our continuing mission to provide you with exceptional heart care, we have created designated Provider Care Teams.  These Care Teams include your primary Cardiologist (physician) and Advanced Practice Providers (APPs -  Physician Assistants and Nurse Practitioners) who all work together to provide you with the care you need, when you need it.  Your next appointment:   6 month(s)  The format for your next appointment:   In Person  Provider:   Donato Schultz, MD {

## 2022-10-03 ENCOUNTER — Telehealth: Payer: Self-pay | Admitting: *Deleted

## 2022-10-03 LAB — BASIC METABOLIC PANEL
BUN/Creatinine Ratio: 15 (ref 12–28)
BUN: 20 mg/dL (ref 8–27)
CO2: 24 mmol/L (ref 20–29)
Calcium: 10.2 mg/dL (ref 8.7–10.3)
Chloride: 98 mmol/L (ref 96–106)
Creatinine, Ser: 1.31 mg/dL — ABNORMAL HIGH (ref 0.57–1.00)
Glucose: 113 mg/dL — ABNORMAL HIGH (ref 70–99)
Potassium: 4.6 mmol/L (ref 3.5–5.2)
Sodium: 135 mmol/L (ref 134–144)
eGFR: 40 mL/min/{1.73_m2} — ABNORMAL LOW (ref >59)

## 2022-10-03 LAB — CBC
Hematocrit: 37.6 % (ref 34.0–46.6)
Hemoglobin: 12.5 g/dL (ref 11.1–15.9)
MCH: 32.3 pg (ref 26.6–33.0)
MCHC: 33.2 g/dL (ref 31.5–35.7)
MCV: 97 fL (ref 79–97)
Platelets: 287 10*3/uL (ref 150–450)
RBC: 3.87 x10E6/uL (ref 3.77–5.28)
RDW: 12.3 % (ref 11.7–15.4)
WBC: 7.7 10*3/uL (ref 3.4–10.8)

## 2022-10-03 LAB — MAGNESIUM: Magnesium: 1.8 mg/dL (ref 1.6–2.3)

## 2022-10-03 LAB — TSH: TSH: 1.35 u[IU]/mL (ref 0.450–4.500)

## 2022-10-03 NOTE — Telephone Encounter (Signed)
-----   Message from Laurann Montana sent at 10/03/2022 11:22 AM EDT ----- Please let pt know labs are stable. Chronic kidney disease persists and looks to be at baseline. Discussed Mg level with EP curbside, since normal, no specific intervention needed for this. I discussed her episodic dizziness with Dr. Anne Fu who recommends we go ahead and decrease her metoprolol tartrate further to 12.5mg  BID. If she doesn't want to cut her pills, OK to transition instead to metoprolol succinate 25mg  once daily (which is equal to the 12.5mg  BID of tartrate). Since we are making this medicine change, please have her f/u in 2 months as well, but keep 6 month recall. Thanks!

## 2022-10-03 NOTE — Telephone Encounter (Signed)
Call placed to patient. No answer on home number. Mailbox on cell phone is full

## 2022-10-05 ENCOUNTER — Other Ambulatory Visit: Payer: Self-pay

## 2022-10-05 ENCOUNTER — Ambulatory Visit: Payer: Medicare PPO | Admitting: Physical Therapy

## 2022-10-05 ENCOUNTER — Encounter: Payer: Self-pay | Admitting: Physical Therapy

## 2022-10-05 DIAGNOSIS — M25561 Pain in right knee: Secondary | ICD-10-CM | POA: Diagnosis not present

## 2022-10-05 DIAGNOSIS — R2681 Unsteadiness on feet: Secondary | ICD-10-CM | POA: Insufficient documentation

## 2022-10-05 DIAGNOSIS — M25562 Pain in left knee: Secondary | ICD-10-CM | POA: Insufficient documentation

## 2022-10-05 DIAGNOSIS — G8929 Other chronic pain: Secondary | ICD-10-CM | POA: Insufficient documentation

## 2022-10-05 DIAGNOSIS — M6281 Muscle weakness (generalized): Secondary | ICD-10-CM | POA: Diagnosis not present

## 2022-10-05 MED ORDER — METOPROLOL TARTRATE 25 MG PO TABS: 12.5000 mg | ORAL_TABLET | Freq: Two times a day (BID) | ORAL | 3 refills | Status: DC

## 2022-10-05 NOTE — Telephone Encounter (Signed)
The patient has been notified of the result and verbalized understanding.  All questions (if any) were answered. Frutoso Schatz, RN 10/05/2022 11:48 AM   Prescription has been updated. Appointment has been scheduled.

## 2022-10-05 NOTE — Therapy (Signed)
OUTPATIENT PHYSICAL THERAPY LOWER EXTREMITY EVALUATION   Patient Name: Cheryl [name] MRN: [id number] DOB:September 12, 1934, 88 y.o., female Today's Date: 10/05/2022  END OF SESSION:  PT End of Session - 10/05/22 1655     Visit Number 1    Number of Visits 13    Date for PT Re-Evaluation 11/16/22    Authorization Type [employer] M[employer]    Authorization Time Period 10/05/22 to 11/16/22    Progress Note Due on Visit 10    PT Start Time 1608   pt a few minutes late   PT Stop Time 1644    PT Time Calculation (min) 36 min    Activity Tolerance Patient tolerated treatment well    Behavior During Therapy [facility] for tasks assessed/performed             Past Medical History:  Diagnosis Date   Arthritis    CAD (coronary artery disease)    Mild per remote cath in 2004; normal nuclear in 2012   Carotid artery disease (HCC)    Chronic kidney disease    stage3 kidney disease    Complication of anesthesia    slow to wake up   Diastolic dysfunction    Grade I, per echo in 2012   GERD (gastroesophageal reflux disease)    prevacid   H/O hiatal hernia    Headache(784.0)    sinus   High risk medication use    Flecainide therapy   History of heartburn    Hyperlipidemia    Hypertension    LVH (left ventricular hypertrophy)    Per echo in July 2012   Normal nuclear stress test 08/07/2010   Obesity    PAF (paroxysmal atrial fibrillation) (HCC)    Pneumonia    20 yrs ago   Syncope 08/07/2010   Past Surgical History:  Procedure Laterality Date   APPENDECTOMY  1952   BACK SURGERY  1980, 2010   x2   CARDIAC CATHETERIZATION  07/28/2002   EF GREATER THAN 55%; MILD CAD   COLONOSCOPY     DILATION AND CURETTAGE OF UTERUS     LUMBAR LAMINECTOMY/DECOMPRESSION MICRODISCECTOMY Right 02/15/2015   Procedure: Right Thoracic twelve-Lumbar one  Microdiskectomy;  Surgeon: Tressie Stalker, MD;  Location: [facility];  Service: Neurosurgery;  Laterality: Right;  Thoracic/Lumbar   TONSILLECTOMY     age 81 yrs    TUBAL LIGATION     Patient Active Problem List   Diagnosis Date Noted   Right knee DJD 11/25/2019   Chondrocalcinosis due to dicalcium phosphate crystals, of the knee, right 11/25/2019   Acute CVA (cerebrovascular accident) (HCC) 11/10/2017   Hypertensive emergency 11/10/2017   S/P insertion of spinal cord stimulator 10/11/2017   Chronic pain syndrome 10/11/2017   Post laminectomy syndrome 10/11/2017   Radiculopathy, lumbar region 10/11/2017   Headache 02/23/2017   Nausea & vomiting 02/23/2017   Hyponatremia 02/23/2017   Hypertensive urgency 02/23/2017   Lumbar herniated disc 02/15/2015   CKD (chronic kidney disease) stage 3, GFR 30-59 ml/min (HCC) 06/22/2014   Spondylolisthesis of lumbar region 08/07/2013   Hyperlipidemia 06/13/2008   PAF (paroxysmal atrial fibrillation) (HCC) 06/13/2008   Essential hypertension 11/05/2006   Coronary atherosclerosis 11/05/2006    PCP: Laurann Montana MD   REFERRING PROVIDER: Persons, [place], [place]  REFERRING DIAG: M25.561,M25.562,G89.29 (ICD-10-CM) - Chronic pain of both knees  THERAPY DIAG:  Chronic pain of right knee  Chronic pain of left knee  Muscle weakness (generalized)  Unsteadiness on feet  Rationale for  Evaluation and Treatment: Rehabilitation  ONSET DATE: chronic   SUBJECTIVE:   SUBJECTIVE STATEMENT: My knees are not my only problem, my back bothers me too. I fell about 10 days ago and hit my knees and that actually made them feel better. My knees are bone on bone. They are doing PT instead of surgery.   PERTINENT HISTORY: OA, CAD, CKD, grade 1 diastolic dysfunction, hx hiatial hernia, HA, HLD, HTN, LVH, obesity, PAF, syncope, back surgery, lumbar laminectomy/microdiscectomy PAIN:  Are you having pain? Yes: NPRS scale: 5-6/10 Pain location: B knees, R>L Pain description: "I don't know, I just put up with it"  Aggravating factors: standing/WB  Relieving factors: sitting down/not being in WB too much   PRECAUTIONS:  Fall  RED FLAGS: None   WEIGHT BEARING RESTRICTIONS: No  FALLS:  Has patient fallen in last 6 months? Yes. Number of falls 2- one in the house, one out at the mailbox   LIVING ENVIRONMENT: Lives with: lives with their family and lives alone Lives in: House/apartment Stairs: level entry/home  Has following equipment at home: Single point cane and Environmental consultant - 2 wheeled  OCCUPATION: retired   PLOF: Independent, Independent with basic ADLs, Independent with gait, Independent with transfers, and Requires assistive device for independence  PATIENT GOALS: unsure, MD sent me here   NEXT MD VISIT: Referring PRN   OBJECTIVE:   DIAGNOSTIC FINDINGS:   Radiographs of the right knee demonstrate advanced tricompartmental  arthritis most notable in the medial compartment no acute osseous injuries  also evidence of chondrocalcinosis  PATIENT SURVEYS:  FOTO 2nd session  COGNITION: Overall cognitive status: Within functional limits for tasks assessed        LOWER EXTREMITY ROM:  Active ROM Right eval Left eval  Hip flexion    Hip extension    Hip abduction    Hip adduction    Hip internal rotation    Hip external rotation    Knee flexion 124* p! 105* p!  Knee extension -9* -7*  Ankle dorsiflexion    Ankle plantarflexion    Ankle inversion    Ankle eversion     (Blank rows = not tested)  LOWER EXTREMITY MMT:  MMT Right eval Left eval  Hip flexion 3 3  Hip extension    Hip abduction 2 2  Hip adduction    Hip internal rotation    Hip external rotation    Knee flexion 3 3  Knee extension 4- 4-  Ankle dorsiflexion 4+ 4  Ankle plantarflexion    Ankle inversion    Ankle eversion     (Blank rows = not tested)    FUNCTIONAL TESTS:  Berg Balance Scale: 33  GAIT: Distance walked: in clinic distances  Assistive device utilized: Single point cane Level of assistance: Modified independence Comments: knees flexed/limited extension ROM, flexed at hips, generally  antalgic    TODAY'S TREATMENT:  DATE:   Eval  Objective findings, POC, HEP   TherEx  Seated hip ABD red TB x10 Seated marches red TB x10 B alternating Seated LAQs red TB x5 B Seated HS curls red TB x5 B     PATIENT EDUCATION:  Education details: exam findings, HEP, POC, role of strengthening hips and knees in reducing knee pain  Person educated: Patient Education method: Explanation, Demonstration, and Handouts Education comprehension: verbalized understanding, returned demonstration, and needs further education  HOME EXERCISE PROGRAM: Access Code: [email or web address] URL: [email or web address] Date: 10/05/2022 Prepared by: Nedra Hai  Exercises - Seated Hip Abduction with Resistance  - 1 x daily - 7 x weekly - 2 sets - 10 reps - 2 seconds  hold - Seated March with Resistance  - 1 x daily - 7 x weekly - 2 sets - 10 reps - 1 second  hold - Seated Hamstring Curls with Resistance  - 1 x daily - 7 x weekly - 2 sets - 10 reps - 2 seconds  hold - Sitting Knee Extension with Resistance  - 1 x daily - 7 x weekly - 2 sets - 10 reps -  2 seconds  hold  ASSESSMENT:  CLINICAL IMPRESSION: Patient is a 88 y.o. F who was seen today for physical therapy evaluation and treatment for B knee pain. Exam with objective findings as above, will benefit from skilled PT services to address all objective and subjective concerns and optimize overall level of function with minimal fall risk.    OBJECTIVE IMPAIRMENTS: Abnormal gait, decreased activity tolerance, decreased balance, decreased knowledge of use of DME, decreased mobility, difficulty walking, decreased ROM, decreased strength, decreased safety awareness, hypomobility, increased fascial restrictions, impaired flexibility, and pain.   ACTIVITY LIMITATIONS: standing, squatting, transfers, and locomotion  level  PARTICIPATION LIMITATIONS: driving, shopping, community activity, yard work, and church  PERSONAL FACTORS: Age, Fitness, Past/current experiences, and Time since onset of injury/illness/exacerbation are also affecting patient's functional outcome.   REHAB POTENTIAL: Fair chronicity of pain   CLINICAL DECISION MAKING: Stable/uncomplicated  EVALUATION COMPLEXITY: Low   GOALS: Goals reviewed with patient? No  SHORT TERM GOALS: Target date: 10/26/2022   Will be compliant with appropriate progressive HEP  Baseline: Goal status: INITIAL  2.  B knee extension and flexion AROM to be equal and symmetrical  Baseline:  Goal status: INITIAL  3.  Will be able to maintain tandem stance for at least 10 seconds with no more than min guard  Baseline:  Goal status: INITIAL    LONG TERM GOALS: Target date: 11/16/2022    MMT to improve by one grade all weak groups  Baseline:  Goal status: INITIAL  2.  Will score at least 43 on Berg balance test to show improved functional balance/reduced fall risk  Baseline:  Goal status: INITIAL  3.  Pain in B knees to be no more than 3/10 at worst  Baseline:  Goal status: INITIAL  4.  Will be able to stand to cook a full meal without significant increase in knee pain from resting levels to improve QOL  Baseline:  Goal status: INITIAL     PLAN:  PT FREQUENCY: 2x/week  PT DURATION: 6 weeks  PLANNED INTERVENTIONS: Therapeutic exercises, Therapeutic activity, Neuromuscular re-education, Gait training, Self Care, Electrical stimulation, Cryotherapy, Moist heat, Vasopneumatic device, Ultrasound, and Manual therapy  PLAN FOR NEXT SESSION: knee ROM and strength with gentle progression to tolerance, balance   Nedra Hai, PT, DPT 10/05/22 4:56 PM

## 2022-10-10 ENCOUNTER — Encounter: Payer: Self-pay | Admitting: Physical Therapy

## 2022-10-10 ENCOUNTER — Ambulatory Visit: Payer: Medicare PPO | Attending: Physician Assistant | Admitting: Physical Therapy

## 2022-10-10 DIAGNOSIS — G8929 Other chronic pain: Secondary | ICD-10-CM | POA: Insufficient documentation

## 2022-10-10 DIAGNOSIS — M6281 Muscle weakness (generalized): Secondary | ICD-10-CM | POA: Insufficient documentation

## 2022-10-10 DIAGNOSIS — M25561 Pain in right knee: Secondary | ICD-10-CM | POA: Insufficient documentation

## 2022-10-10 DIAGNOSIS — R2681 Unsteadiness on feet: Secondary | ICD-10-CM | POA: Diagnosis not present

## 2022-10-10 DIAGNOSIS — M25562 Pain in left knee: Secondary | ICD-10-CM | POA: Insufficient documentation

## 2022-10-10 NOTE — Therapy (Signed)
OUTPATIENT PHYSICAL THERAPY LOWER EXTREMITY EVALUATION   Patient Name: Cheryl [name] MRN: [id number] DOB:12/27/1934, 88 y.o., female Today's Date: 10/10/2022  END OF SESSION:  PT End of Session - 10/10/22 1055     Visit Number 2    Date for PT Re-Evaluation 11/16/22    PT Start Time 1050    PT Stop Time 1135    PT Time Calculation (min) 45 min    Activity Tolerance Patient tolerated treatment well    Behavior During Therapy [facility] for tasks assessed/performed              Past Medical History:  Diagnosis Date   Arthritis    CAD (coronary artery disease)    Mild per remote cath in 2004; normal nuclear in 2012   Carotid artery disease (HCC)    Chronic kidney disease    stage3 kidney disease    Complication of anesthesia    slow to wake up   Diastolic dysfunction    Grade I, per echo in 2012   GERD (gastroesophageal reflux disease)    prevacid   H/O hiatal hernia    Headache(784.0)    sinus   High risk medication use    Flecainide therapy   History of heartburn    Hyperlipidemia    Hypertension    LVH (left ventricular hypertrophy)    Per echo in July 2012   Normal nuclear stress test 08/07/2010   Obesity    PAF (paroxysmal atrial fibrillation) (HCC)    Pneumonia    20 yrs ago   Syncope 08/07/2010   Past Surgical History:  Procedure Laterality Date   APPENDECTOMY  1952   BACK SURGERY  1980, 2010   x2   CARDIAC CATHETERIZATION  07/28/2002   EF GREATER THAN 55%; MILD CAD   COLONOSCOPY     DILATION AND CURETTAGE OF UTERUS     LUMBAR LAMINECTOMY/DECOMPRESSION MICRODISCECTOMY Right 02/15/2015   Procedure: Right Thoracic twelve-Lumbar one  Microdiskectomy;  Surgeon: Tressie Stalker, MD;  Location: [facility];  Service: Neurosurgery;  Laterality: Right;  Thoracic/Lumbar   TONSILLECTOMY     age 67 yrs   TUBAL LIGATION     Patient Active Problem List   Diagnosis Date Noted   Right knee DJD 11/25/2019   Chondrocalcinosis due to dicalcium phosphate crystals, of  the knee, right 11/25/2019   Acute CVA (cerebrovascular accident) (HCC) 11/10/2017   Hypertensive emergency 11/10/2017   S/P insertion of spinal cord stimulator 10/11/2017   Chronic pain syndrome 10/11/2017   Post laminectomy syndrome 10/11/2017   Radiculopathy, lumbar region 10/11/2017   Headache 02/23/2017   Nausea & vomiting 02/23/2017   Hyponatremia 02/23/2017   Hypertensive urgency 02/23/2017   Lumbar herniated disc 02/15/2015   CKD (chronic kidney disease) stage 3, GFR 30-59 ml/min (HCC) 06/22/2014   Spondylolisthesis of lumbar region 08/07/2013   Hyperlipidemia 06/13/2008   PAF (paroxysmal atrial fibrillation) (HCC) 06/13/2008   Essential hypertension 11/05/2006   Coronary atherosclerosis 11/05/2006    PCP: Laurann Montana MD   REFERRING PROVIDER: Persons, [place], [place]  REFERRING DIAG: M25.561,M25.562,G89.29 (ICD-10-CM) - Chronic pain of both knees  THERAPY DIAG:  Chronic pain of right knee  Chronic pain of left knee  Muscle weakness (generalized)  Unsteadiness on feet  Rationale for Evaluation and Treatment: Rehabilitation  ONSET DATE: chronic   SUBJECTIVE:   SUBJECTIVE STATEMENT: Patient reports no changes. She hurt too much to try the Hep over the weekend, but did perform yesterday with no increase  in her pain.   PERTINENT HISTORY: OA, CAD, CKD, grade 1 diastolic dysfunction, hx hiatial hernia, HA, HLD, HTN, LVH, obesity, PAF, syncope, back surgery, lumbar laminectomy/microdiscectomy PAIN:  Are you having pain? Yes: NPRS scale: 5-6/10 Pain location: B knees, R>L Pain description: "I don't know, I just put up with it"  Aggravating factors: standing/WB  Relieving factors: sitting down/not being in WB too much   PRECAUTIONS: Fall  RED FLAGS: None   WEIGHT BEARING RESTRICTIONS: No  FALLS:  Has patient fallen in last 6 months? Yes. Number of falls 2- one in the house, one out at the mailbox   LIVING ENVIRONMENT: Lives with: lives with their family  and lives alone Lives in: House/apartment Stairs: level entry/home  Has following equipment at home: Single point cane and Environmental consultant - 2 wheeled  OCCUPATION: retired   PLOF: Independent, Independent with basic ADLs, Independent with gait, Independent with transfers, and Requires assistive device for independence  PATIENT GOALS: unsure, MD sent me here   NEXT MD VISIT: Referring PRN   OBJECTIVE:   DIAGNOSTIC FINDINGS:   Radiographs of the right knee demonstrate advanced tricompartmental  arthritis most notable in the medial compartment no acute osseous injuries  also evidence of chondrocalcinosis  PATIENT SURVEYS:  FOTO 2nd session  COGNITION: Overall cognitive status: Within functional limits for tasks assessed    LOWER EXTREMITY ROM:  Active ROM Right eval Left eval  Hip flexion    Hip extension    Hip abduction    Hip adduction    Hip internal rotation    Hip external rotation    Knee flexion 124* p! 105* p!  Knee extension -9* -7*  Ankle dorsiflexion    Ankle plantarflexion    Ankle inversion    Ankle eversion     (Blank rows = not tested)  LOWER EXTREMITY MMT:  MMT Right eval Left eval  Hip flexion 3 3  Hip extension    Hip abduction 2 2  Hip adduction    Hip internal rotation    Hip external rotation    Knee flexion 3 3  Knee extension 4- 4-  Ankle dorsiflexion 4+ 4  Ankle plantarflexion    Ankle inversion    Ankle eversion     (Blank rows = not tested)  FUNCTIONAL TESTS:  Berg Balance Scale: 33  GAIT: Distance walked: in clinic distances  Assistive device utilized: Single point cane Level of assistance: Modified independence Comments: knees flexed/limited extension ROM, flexed at hips, generally antalgic   TODAY'S TREATMENT:                                                                                                                              DATE:  10/10/22 NuStep L3 x 6 minutes Supine Strengthening-SLR, SLR with hip ER, Both  irritated hip pain, 10 each B knee press with small towel roll under knees due to pain without any support, 5 sec holds x 10  reps, clamshells with G tband x 10, B bridge with active trunk stabilization x 5 reps. Supine with legs over physioball stretching, DKTC, glut stretch on each side. Supine- practiced glut stretch, LTR without ball for HEP Supine strength- pelvic tilt into bridge x 10. Seated HS stretch  Eval Objective findings, POC, HEP   TherEx  Seated hip ABD red TB x10 Seated marches red TB x10 B alternating Seated LAQs red TB x5 B Seated HS curls red TB x5 B     PATIENT EDUCATION:  Education details: exam findings, HEP, POC, role of strengthening hips and knees in reducing knee pain  Person educated: Patient Education method: Explanation, Demonstration, and Handouts Education comprehension: verbalized understanding, returned demonstration, and needs further education  HOME EXERCISE PROGRAM: Access Code: [email or web address] URL: [email or web address] Date: 10/05/2022 Prepared by: Nedra Hai  Exercises - Seated Hip Abduction with Resistance  - 1 x daily - 7 x weekly - 2 sets - 10 reps - 2 seconds  hold - Seated March with Resistance  - 1 x daily - 7 x weekly - 2 sets - 10 reps - 1 second  hold - Seated Hamstring Curls with Resistance  - 1 x daily - 7 x weekly - 2 sets - 10 reps - 2 seconds  hold - Sitting Knee Extension with Resistance  - 1 x daily - 7 x weekly - 2 sets - 10 reps -  2 seconds  hold  ASSESSMENT:  CLINICAL IMPRESSION: Patient is a 88 y.o. F who was seen today for physical therapy treatment for B knee pain. She is quite limited due to B hip and knee pain, but we identified some additional stretching and strengthening exercises for her to perform at home. She will practice at home and report any issues.  OBJECTIVE IMPAIRMENTS: Abnormal gait, decreased activity tolerance, decreased balance, decreased knowledge of use of DME, decreased mobility,  difficulty walking, decreased ROM, decreased strength, decreased safety awareness, hypomobility, increased fascial restrictions, impaired flexibility, and pain.   ACTIVITY LIMITATIONS: standing, squatting, transfers, and locomotion level  PARTICIPATION LIMITATIONS: driving, shopping, community activity, yard work, and church  PERSONAL FACTORS: Age, Fitness, Past/current experiences, and Time since onset of injury/illness/exacerbation are also affecting patient's functional outcome.   REHAB POTENTIAL: Fair chronicity of pain   CLINICAL DECISION MAKING: Stable/uncomplicated  EVALUATION COMPLEXITY: Low   GOALS: Goals reviewed with patient? No  SHORT TERM GOALS: Target date: 10/26/2022   Will be compliant with appropriate progressive HEP  Baseline: Goal status: 10/10/22-HEP provided, ongoing  2.  B knee extension and flexion AROM to be equal and symmetrical  Baseline:  Goal status: INITIAL  3.  Will be able to maintain tandem stance for at least 10 seconds with no more than min guard  Baseline:  Goal status: INITIAL  LONG TERM GOALS: Target date: 11/16/2022   MMT to improve by one grade all weak groups  Baseline:  Goal status: INITIAL  2.  Will score at least 43 on Berg balance test to show improved functional balance/reduced fall risk  Baseline:  Goal status: INITIAL  3.  Pain in B knees to be no more than 3/10 at worst  Baseline:  Goal status: INITIAL  4.  Will be able to stand to cook a full meal without significant increase in knee pain from resting levels to improve QOL  Baseline:  Goal status: INITIAL  PLAN:  PT FREQUENCY: 2x/week  PT DURATION: 6 weeks  PLANNED INTERVENTIONS: Therapeutic exercises, Therapeutic activity, Neuromuscular re-education, Gait  training, Self Care, Electrical stimulation, Cryotherapy, Moist heat, Vasopneumatic device, Ultrasound, and Manual therapy  PLAN FOR NEXT SESSION: knee ROM and strength with gentle progression to tolerance,  balance   Oley Balm DPT 10/10/22 11:46 AM

## 2022-10-12 ENCOUNTER — Encounter: Payer: Self-pay | Admitting: Physical Therapy

## 2022-10-12 ENCOUNTER — Ambulatory Visit: Payer: Medicare PPO | Admitting: Physical Therapy

## 2022-10-12 DIAGNOSIS — G8929 Other chronic pain: Secondary | ICD-10-CM

## 2022-10-12 DIAGNOSIS — M25561 Pain in right knee: Secondary | ICD-10-CM | POA: Diagnosis not present

## 2022-10-12 DIAGNOSIS — R2681 Unsteadiness on feet: Secondary | ICD-10-CM | POA: Diagnosis not present

## 2022-10-12 DIAGNOSIS — M6281 Muscle weakness (generalized): Secondary | ICD-10-CM

## 2022-10-12 DIAGNOSIS — M25562 Pain in left knee: Secondary | ICD-10-CM | POA: Diagnosis not present

## 2022-10-12 NOTE — Therapy (Signed)
OUTPATIENT PHYSICAL THERAPY LOWER EXTREMITY EVALUATION   Patient Name: Cheryl [name] MRN: [id number] DOB:11/17/1934, 88 y.o., female Today's Date: 10/12/2022  END OF SESSION:  PT End of Session - 10/12/22 1110     Visit Number 3    Date for PT Re-Evaluation 11/16/22    PT Start Time 1100    PT Stop Time 1140    PT Time Calculation (min) 40 min    Activity Tolerance Patient tolerated treatment well;Patient limited by pain    Behavior During Therapy [facility] for tasks assessed/performed               Past Medical History:  Diagnosis Date   Arthritis    CAD (coronary artery disease)    Mild per remote cath in 2004; normal nuclear in 2012   Carotid artery disease (HCC)    Chronic kidney disease    stage3 kidney disease    Complication of anesthesia    slow to wake up   Diastolic dysfunction    Grade I, per echo in 2012   GERD (gastroesophageal reflux disease)    prevacid   H/O hiatal hernia    Headache(784.0)    sinus   High risk medication use    Flecainide therapy   History of heartburn    Hyperlipidemia    Hypertension    LVH (left ventricular hypertrophy)    Per echo in July 2012   Normal nuclear stress test 08/07/2010   Obesity    PAF (paroxysmal atrial fibrillation) (HCC)    Pneumonia    20 yrs ago   Syncope 08/07/2010   Past Surgical History:  Procedure Laterality Date   APPENDECTOMY  1952   BACK SURGERY  1980, 2010   x2   CARDIAC CATHETERIZATION  07/28/2002   EF GREATER THAN 55%; MILD CAD   COLONOSCOPY     DILATION AND CURETTAGE OF UTERUS     LUMBAR LAMINECTOMY/DECOMPRESSION MICRODISCECTOMY Right 02/15/2015   Procedure: Right Thoracic twelve-Lumbar one  Microdiskectomy;  Surgeon: Tressie Stalker, MD;  Location: [facility];  Service: Neurosurgery;  Laterality: Right;  Thoracic/Lumbar   TONSILLECTOMY     age 25 yrs   TUBAL LIGATION     Patient Active Problem List   Diagnosis Date Noted   Right knee DJD 11/25/2019   Chondrocalcinosis due to  dicalcium phosphate crystals, of the knee, right 11/25/2019   Acute CVA (cerebrovascular accident) (HCC) 11/10/2017   Hypertensive emergency 11/10/2017   S/P insertion of spinal cord stimulator 10/11/2017   Chronic pain syndrome 10/11/2017   Post laminectomy syndrome 10/11/2017   Radiculopathy, lumbar region 10/11/2017   Headache 02/23/2017   Nausea & vomiting 02/23/2017   Hyponatremia 02/23/2017   Hypertensive urgency 02/23/2017   Lumbar herniated disc 02/15/2015   CKD (chronic kidney disease) stage 3, GFR 30-59 ml/min (HCC) 06/22/2014   Spondylolisthesis of lumbar region 08/07/2013   Hyperlipidemia 06/13/2008   PAF (paroxysmal atrial fibrillation) (HCC) 06/13/2008   Essential hypertension 11/05/2006   Coronary atherosclerosis 11/05/2006    PCP: Laurann Montana MD   REFERRING PROVIDER: Persons, [place], [place]  REFERRING DIAG: M25.561,M25.562,G89.29 (ICD-10-CM) - Chronic pain of both knees  THERAPY DIAG:  Chronic pain of right knee  Unsteadiness on feet  Chronic pain of left knee  Muscle weakness (generalized)  Rationale for Evaluation and Treatment: Rehabilitation  ONSET DATE: chronic   SUBJECTIVE:   SUBJECTIVE STATEMENT: Patient reports no changes. She tried some of her HEP exercises. She is finding them very challenging.  PERTINENT HISTORY: OA, CAD, CKD, grade 1 diastolic dysfunction, hx hiatial hernia, HA, HLD, HTN, LVH, obesity, PAF, syncope, back surgery, lumbar laminectomy/microdiscectomy PAIN:  Are you having pain? Yes: NPRS scale: 5-6/10 Pain location: B knees, R>L Pain description: "I don't know, I just put up with it"  Aggravating factors: standing/WB  Relieving factors: sitting down/not being in WB too much   PRECAUTIONS: Fall  RED FLAGS: None   WEIGHT BEARING RESTRICTIONS: No  FALLS:  Has patient fallen in last 6 months? Yes. Number of falls 2- one in the house, one out at the mailbox   LIVING ENVIRONMENT: Lives with: lives with their family  and lives alone Lives in: House/apartment Stairs: level entry/home  Has following equipment at home: Single point cane and Environmental consultant - 2 wheeled  OCCUPATION: retired   PLOF: Independent, Independent with basic ADLs, Independent with gait, Independent with transfers, and Requires assistive device for independence  PATIENT GOALS: unsure, MD sent me here   NEXT MD VISIT: Referring PRN   OBJECTIVE:   DIAGNOSTIC FINDINGS:   Radiographs of the right knee demonstrate advanced tricompartmental  arthritis most notable in the medial compartment no acute osseous injuries  also evidence of chondrocalcinosis  PATIENT SURVEYS:  FOTO 2nd session  COGNITION: Overall cognitive status: Within functional limits for tasks assessed    LOWER EXTREMITY ROM:  Active ROM Right eval Left eval  Hip flexion    Hip extension    Hip abduction    Hip adduction    Hip internal rotation    Hip external rotation    Knee flexion 124* p! 105* p!  Knee extension -9* -7*  Ankle dorsiflexion    Ankle plantarflexion    Ankle inversion    Ankle eversion     (Blank rows = not tested)  LOWER EXTREMITY MMT:  MMT Right eval Left eval  Hip flexion 3 3  Hip extension    Hip abduction 2 2  Hip adduction    Hip internal rotation    Hip external rotation    Knee flexion 3 3  Knee extension 4- 4-  Ankle dorsiflexion 4+ 4  Ankle plantarflexion    Ankle inversion    Ankle eversion     (Blank rows = not tested)  FUNCTIONAL TESTS:  Berg Balance Scale: 33  GAIT: Distance walked: in clinic distances  Assistive device utilized: Single point cane Level of assistance: Modified independence Comments: knees flexed/limited extension ROM, flexed at hips, generally antalgic   TODAY'S TREATMENT:                                                                                                                              DATE:  10/12/22 NuStep L3 x 6 minutes Seated knee ext with 2# resistance. 2 x 10 reps  each Seated knee flex against Red tband, 2 x 10 Seated hip add against ball, 5 sec x 10 Seated hip abd against red Tband 2 x  10 reps Ambulated without her cane-L lateral trunk flexion noted with R hip pelvic obliquity-She reports this is a chronic condition due to previous back surgeries. Seated trunk rotation-place both hands to the R and slide hands away from trunk, return, repeat to L. Also seated pelvic tilts Standing heel raises with BUE support, 2 x 10 reps Back stretch, flexion over blue physioball, forward, then to each side x 5 with hold for deep breath.  10/10/22 NuStep L3 x 6 minutes Supine Strengthening-SLR, SLR with hip ER, Both irritated hip pain, 10 each B knee press with small towel roll under knees due to pain without any support, 5 sec holds x 10 reps, clamshells with G tband x 10, B bridge with active trunk stabilization x 5 reps. Supine with legs over physioball stretching, DKTC, glut stretch on each side. Supine- practiced glut stretch, LTR without ball for HEP Supine strength- pelvic tilt into bridge x 10. Seated HS stretch  Eval Objective findings, POC, HEP   TherEx  Seated hip ABD red TB x10 Seated marches red TB x10 B alternating Seated LAQs red TB x5 B Seated HS curls red TB x5 B     PATIENT EDUCATION:  Education details: exam findings, HEP, POC, role of strengthening hips and knees in reducing knee pain  Person educated: Patient Education method: Explanation, Demonstration, and Handouts Education comprehension: verbalized understanding, returned demonstration, and needs further education  HOME EXERCISE PROGRAM: Access Code: [email or web address] URL: [email or web address] Date: 10/05/2022 Prepared by: Nedra Hai  Exercises - Seated Hip Abduction with Resistance  - 1 x daily - 7 x weekly - 2 sets - 10 reps - 2 seconds  hold - Seated March with Resistance  - 1 x daily - 7 x weekly - 2 sets - 10 reps - 1 second  hold - Seated Hamstring Curls with  Resistance  - 1 x daily - 7 x weekly - 2 sets - 10 reps - 2 seconds  hold - Sitting Knee Extension with Resistance  - 1 x daily - 7 x weekly - 2 sets - 10 reps -  2 seconds  hold  ASSESSMENT:  CLINICAL IMPRESSION: Patient is a 88 y.o. F who was seen today for physical therapy treatment for B knee pain. She is quite limited due to B hip and knee pain. She also demonstrates pelvic obliquity, with R lateral trunk flexion, chronic due to previous back surgeries. Treatment continued to focus on strengthening, also provided some trunk mobility and stretching for relief of her back pain.  OBJECTIVE IMPAIRMENTS: Abnormal gait, decreased activity tolerance, decreased balance, decreased knowledge of use of DME, decreased mobility, difficulty walking, decreased ROM, decreased strength, decreased safety awareness, hypomobility, increased fascial restrictions, impaired flexibility, and pain.   ACTIVITY LIMITATIONS: standing, squatting, transfers, and locomotion level  PARTICIPATION LIMITATIONS: driving, shopping, community activity, yard work, and church  PERSONAL FACTORS: Age, Fitness, Past/current experiences, and Time since onset of injury/illness/exacerbation are also affecting patient's functional outcome.   REHAB POTENTIAL: Fair chronicity of pain   CLINICAL DECISION MAKING: Stable/uncomplicated  EVALUATION COMPLEXITY: Low   GOALS: Goals reviewed with patient? No  SHORT TERM GOALS: Target date: 10/26/2022   Will be compliant with appropriate progressive HEP  Baseline: Goal status: 10/10/22-HEP provided, ongoing  2.  B knee extension and flexion AROM to be equal and symmetrical  Baseline:  Goal status: INITIAL  3.  Will be able to maintain tandem stance for at least 10 seconds with no more than min guard  Baseline:  Goal status: INITIAL  LONG TERM GOALS: Target date: 11/16/2022   MMT to improve by one grade all weak groups  Baseline:  Goal status: INITIAL  2.  Will score at least  43 on Berg balance test to show improved functional balance/reduced fall risk  Baseline:  Goal status: INITIAL  3.  Pain in B knees to be no more than 3/10 at worst  Baseline:  Goal status: INITIAL  4.  Will be able to stand to cook a full meal without significant increase in knee pain from resting levels to improve QOL  Baseline:  Goal status: INITIAL  PLAN:  PT FREQUENCY: 2x/week  PT DURATION: 6 weeks  PLANNED INTERVENTIONS: Therapeutic exercises, Therapeutic activity, Neuromuscular re-education, Gait training, Self Care, Electrical stimulation, Cryotherapy, Moist heat, Vasopneumatic device, Ultrasound, and Manual therapy  PLAN FOR NEXT SESSION: knee ROM and strength with gentle progression to tolerance, balance   Oley Balm DPT 10/12/22 11:39 AM

## 2022-10-16 DIAGNOSIS — G4700 Insomnia, unspecified: Secondary | ICD-10-CM | POA: Diagnosis not present

## 2022-10-16 DIAGNOSIS — Z23 Encounter for immunization: Secondary | ICD-10-CM | POA: Diagnosis not present

## 2022-10-16 DIAGNOSIS — I480 Paroxysmal atrial fibrillation: Secondary | ICD-10-CM | POA: Diagnosis not present

## 2022-10-16 DIAGNOSIS — D6869 Other thrombophilia: Secondary | ICD-10-CM | POA: Diagnosis not present

## 2022-10-16 DIAGNOSIS — N183 Chronic kidney disease, stage 3 unspecified: Secondary | ICD-10-CM | POA: Diagnosis not present

## 2022-10-16 DIAGNOSIS — E785 Hyperlipidemia, unspecified: Secondary | ICD-10-CM | POA: Diagnosis not present

## 2022-10-16 DIAGNOSIS — I69352 Hemiplegia and hemiparesis following cerebral infarction affecting left dominant side: Secondary | ICD-10-CM | POA: Diagnosis not present

## 2022-10-16 DIAGNOSIS — I77810 Thoracic aortic ectasia: Secondary | ICD-10-CM | POA: Diagnosis not present

## 2022-10-16 DIAGNOSIS — I129 Hypertensive chronic kidney disease with stage 1 through stage 4 chronic kidney disease, or unspecified chronic kidney disease: Secondary | ICD-10-CM | POA: Diagnosis not present

## 2022-10-17 ENCOUNTER — Ambulatory Visit: Payer: Medicare PPO | Admitting: Physical Therapy

## 2022-10-17 ENCOUNTER — Encounter: Payer: Self-pay | Admitting: Physical Therapy

## 2022-10-17 DIAGNOSIS — M25561 Pain in right knee: Secondary | ICD-10-CM | POA: Diagnosis not present

## 2022-10-17 DIAGNOSIS — G8929 Other chronic pain: Secondary | ICD-10-CM | POA: Diagnosis not present

## 2022-10-17 DIAGNOSIS — R2681 Unsteadiness on feet: Secondary | ICD-10-CM | POA: Diagnosis not present

## 2022-10-17 DIAGNOSIS — M6281 Muscle weakness (generalized): Secondary | ICD-10-CM | POA: Diagnosis not present

## 2022-10-17 DIAGNOSIS — M25562 Pain in left knee: Secondary | ICD-10-CM | POA: Diagnosis not present

## 2022-10-17 NOTE — Therapy (Signed)
OUTPATIENT PHYSICAL THERAPY LOWER EXTREMITY EVALUATION   Patient Name: Cheryl [name] MRN: [id number] DOB:1934/10/06, 88 y.o., female Today's Date: 10/17/2022  END OF SESSION:  PT End of Session - 10/17/22 1515     Visit Number 4    Date for PT Re-Evaluation 11/16/22    PT Start Time 1515    PT Stop Time 1600    PT Time Calculation (min) 45 min    Activity Tolerance Patient tolerated treatment well    Behavior During Therapy [facility] for tasks assessed/performed               Past Medical History:  Diagnosis Date   Arthritis    CAD (coronary artery disease)    Mild per remote cath in 2004; normal nuclear in 2012   Carotid artery disease (HCC)    Chronic kidney disease    stage3 kidney disease    Complication of anesthesia    slow to wake up   Diastolic dysfunction    Grade I, per echo in 2012   GERD (gastroesophageal reflux disease)    prevacid   H/O hiatal hernia    Headache(784.0)    sinus   High risk medication use    Flecainide therapy   History of heartburn    Hyperlipidemia    Hypertension    LVH (left ventricular hypertrophy)    Per echo in July 2012   Normal nuclear stress test 08/07/2010   Obesity    PAF (paroxysmal atrial fibrillation) (HCC)    Pneumonia    20 yrs ago   Syncope 08/07/2010   Past Surgical History:  Procedure Laterality Date   APPENDECTOMY  1952   BACK SURGERY  1980, 2010   x2   CARDIAC CATHETERIZATION  07/28/2002   EF GREATER THAN 55%; MILD CAD   COLONOSCOPY     DILATION AND CURETTAGE OF UTERUS     LUMBAR LAMINECTOMY/DECOMPRESSION MICRODISCECTOMY Right 02/15/2015   Procedure: Right Thoracic twelve-Lumbar one  Microdiskectomy;  Surgeon: Tressie Stalker, MD;  Location: [facility];  Service: Neurosurgery;  Laterality: Right;  Thoracic/Lumbar   TONSILLECTOMY     age 82 yrs   TUBAL LIGATION     Patient Active Problem List   Diagnosis Date Noted   Right knee DJD 11/25/2019   Chondrocalcinosis due to dicalcium phosphate crystals,  of the knee, right 11/25/2019   Acute CVA (cerebrovascular accident) (HCC) 11/10/2017   Hypertensive emergency 11/10/2017   S/P insertion of spinal cord stimulator 10/11/2017   Chronic pain syndrome 10/11/2017   Post laminectomy syndrome 10/11/2017   Radiculopathy, lumbar region 10/11/2017   Headache 02/23/2017   Nausea & vomiting 02/23/2017   Hyponatremia 02/23/2017   Hypertensive urgency 02/23/2017   Lumbar herniated disc 02/15/2015   CKD (chronic kidney disease) stage 3, GFR 30-59 ml/min (HCC) 06/22/2014   Spondylolisthesis of lumbar region 08/07/2013   Hyperlipidemia 06/13/2008   PAF (paroxysmal atrial fibrillation) (HCC) 06/13/2008   Essential hypertension 11/05/2006   Coronary atherosclerosis 11/05/2006    PCP: Laurann Montana MD   REFERRING PROVIDER: Persons, [place], [place]  REFERRING DIAG: M25.561,M25.562,G89.29 (ICD-10-CM) - Chronic pain of both knees  THERAPY DIAG:  Chronic pain of right knee  Unsteadiness on feet  Chronic pain of left knee  Muscle weakness (generalized)  Rationale for Evaluation and Treatment: Rehabilitation  ONSET DATE: chronic   SUBJECTIVE:   SUBJECTIVE STATEMENT: Feeling tired today. Took a new BP medication last night  PERTINENT HISTORY: OA, CAD, CKD, grade 1 diastolic dysfunction, hx  hiatial hernia, HA, HLD, HTN, LVH, obesity, PAF, syncope, back surgery, lumbar laminectomy/microdiscectomy PAIN:  Are you having pain? Yes: NPRS scale: 0/10 Pain location: B knees, R>L Pain description: "Stiff"  Aggravating factors: standing/WB  Relieving factors: sitting down/not being in WB too much   PRECAUTIONS: Fall  RED FLAGS: None   WEIGHT BEARING RESTRICTIONS: No  FALLS:  Has patient fallen in last 6 months? Yes. Number of falls 2- one in the house, one out at the mailbox   LIVING ENVIRONMENT: Lives with: lives with their family and lives alone Lives in: House/apartment Stairs: level entry/home  Has following equipment at home:  Single point cane and Environmental consultant - 2 wheeled  OCCUPATION: retired   PLOF: Independent, Independent with basic ADLs, Independent with gait, Independent with transfers, and Requires assistive device for independence  PATIENT GOALS: unsure, MD sent me here   NEXT MD VISIT: Referring PRN   OBJECTIVE:   DIAGNOSTIC FINDINGS:   Radiographs of the right knee demonstrate advanced tricompartmental  arthritis most notable in the medial compartment no acute osseous injuries  also evidence of chondrocalcinosis  PATIENT SURVEYS:  FOTO 2nd session  COGNITION: Overall cognitive status: Within functional limits for tasks assessed    LOWER EXTREMITY ROM:  Active ROM Right eval Left eval  Hip flexion    Hip extension    Hip abduction    Hip adduction    Hip internal rotation    Hip external rotation    Knee flexion 124* p! 105* p!  Knee extension -9* -7*  Ankle dorsiflexion    Ankle plantarflexion    Ankle inversion    Ankle eversion     (Blank rows = not tested)  LOWER EXTREMITY MMT:  MMT Right eval Left eval  Hip flexion 3 3  Hip extension    Hip abduction 2 2  Hip adduction    Hip internal rotation    Hip external rotation    Knee flexion 3 3  Knee extension 4- 4-  Ankle dorsiflexion 4+ 4  Ankle plantarflexion    Ankle inversion    Ankle eversion     (Blank rows = not tested)  FUNCTIONAL TESTS:  Berg Balance Scale: 33  GAIT: Distance walked: in clinic distances  Assistive device utilized: Single point cane Level of assistance: Modified independence Comments: knees flexed/limited extension ROM, flexed at hips, generally antalgic   TODAY'S TREATMENT:                                                                                                                              DATE:  10/17/22 BP 156/85 NuStep L3 x 6 minutes LAQ 2lb 2x12 HS curls red 2x12 Seated hip abd against red Tband 2 x 10 reps Seated hip add against ball, 5 sec x 10 S2S 2x5 with UE use     10/12/22 NuStep L3 x 6 minutes Seated knee ext with 2# resistance. 2 x 10 reps each Seated knee  flex against Red tband, 2 x 10 Seated hip add against ball, 5 sec x 10 Seated hip abd against red Tband 2 x 10 reps Ambulated without her cane-L lateral trunk flexion noted with R hip pelvic obliquity-She reports this is a chronic condition due to previous back surgeries. Seated trunk rotation-place both hands to the R and slide hands away from trunk, return, repeat to L. Also seated pelvic tilts Standing heel raises with BUE support, 2 x 10 reps Back stretch, flexion over blue physioball, forward, then to each side x 5 with hold for deep breath.  10/10/22 NuStep L3 x 6 minutes Supine Strengthening-SLR, SLR with hip ER, Both irritated hip pain, 10 each B knee press with small towel roll under knees due to pain without any support, 5 sec holds x 10 reps, clamshells with G tband x 10, B bridge with active trunk stabilization x 5 reps. Supine with legs over physioball stretching, DKTC, glut stretch on each side. Supine- practiced glut stretch, LTR without ball for HEP Supine strength- pelvic tilt into bridge x 10. Seated HS stretch  Eval Objective findings, POC, HEP   TherEx  Seated hip ABD red TB x10 Seated marches red TB x10 B alternating Seated LAQs red TB x5 B Seated HS curls red TB x5 B     PATIENT EDUCATION:  Education details: exam findings, HEP, POC, role of strengthening hips and knees in reducing knee pain  Person educated: Patient Education method: Explanation, Demonstration, and Handouts Education comprehension: verbalized understanding, returned demonstration, and needs further education  HOME EXERCISE PROGRAM: Access Code: [email or web address] URL: [email or web address] Date: 10/05/2022 Prepared by: Nedra Hai  Exercises - Seated Hip Abduction with Resistance  - 1 x daily - 7 x weekly - 2 sets - 10 reps - 2 seconds  hold - Seated March with Resistance  - 1 x  daily - 7 x weekly - 2 sets - 10 reps - 1 second  hold - Seated Hamstring Curls with Resistance  - 1 x daily - 7 x weekly - 2 sets - 10 reps - 2 seconds  hold - Sitting Knee Extension with Resistance  - 1 x daily - 7 x weekly - 2 sets - 10 reps -  2 seconds  hold  ASSESSMENT:  CLINICAL IMPRESSION: Patient is a 88 y.o. F who was seen today for physical therapy treatment for B knee pain. She is quite limited due to B hip and knee pain. She also demonstrates pelvic obliquity, with R lateral trunk flexion, chronic due to previous back surgeries. Treatment continued to focus on strengthening. Cues not to allow LE to push against mat table with sit to stands. Session intensity adjusted due to pt c/o fatigue from new BP meds.  OBJECTIVE IMPAIRMENTS: Abnormal gait, decreased activity tolerance, decreased balance, decreased knowledge of use of DME, decreased mobility, difficulty walking, decreased ROM, decreased strength, decreased safety awareness, hypomobility, increased fascial restrictions, impaired flexibility, and pain.   ACTIVITY LIMITATIONS: standing, squatting, transfers, and locomotion level  PARTICIPATION LIMITATIONS: driving, shopping, community activity, yard work, and church  PERSONAL FACTORS: Age, Fitness, Past/current experiences, and Time since onset of injury/illness/exacerbation are also affecting patient's functional outcome.   REHAB POTENTIAL: Fair chronicity of pain   CLINICAL DECISION MAKING: Stable/uncomplicated  EVALUATION COMPLEXITY: Low   GOALS: Goals reviewed with patient? No  SHORT TERM GOALS: Target date: 10/26/2022   Will be compliant with appropriate progressive HEP  Baseline: Goal status: 10/10/22-HEP provided, ongoing  2.  B knee extension  and flexion AROM to be equal and symmetrical  Baseline:  Goal status: INITIAL  3.  Will be able to maintain tandem stance for at least 10 seconds with no more than min guard  Baseline:  Goal status: INITIAL  LONG TERM  GOALS: Target date: 11/16/2022   MMT to improve by one grade all weak groups  Baseline:  Goal status: INITIAL  2.  Will score at least 43 on Berg balance test to show improved functional balance/reduced fall risk  Baseline:  Goal status: INITIAL  3.  Pain in B knees to be no more than 3/10 at worst  Baseline:  Goal status: INITIAL  4.  Will be able to stand to cook a full meal without significant increase in knee pain from resting levels to improve QOL  Baseline:  Goal status: INITIAL  PLAN:  PT FREQUENCY: 2x/week  PT DURATION: 6 weeks  PLANNED INTERVENTIONS: Therapeutic exercises, Therapeutic activity, Neuromuscular re-education, Gait training, Self Care, Electrical stimulation, Cryotherapy, Moist heat, Vasopneumatic device, Ultrasound, and Manual therapy  PLAN FOR NEXT SESSION: knee ROM and strength with gentle progression to tolerance, balance   Oley Balm DPT 10/17/22 3:15 PM

## 2022-10-20 ENCOUNTER — Other Ambulatory Visit: Payer: Self-pay

## 2022-10-20 ENCOUNTER — Ambulatory Visit: Payer: Medicare PPO

## 2022-10-20 ENCOUNTER — Ambulatory Visit
Admission: RE | Admit: 2022-10-20 | Discharge: 2022-10-20 | Disposition: A | Discharge status: Home / Self Care | Payer: Medicare PPO | Source: Ambulatory Visit | Attending: Family Medicine | Admitting: Family Medicine

## 2022-10-20 DIAGNOSIS — G8929 Other chronic pain: Secondary | ICD-10-CM | POA: Diagnosis not present

## 2022-10-20 DIAGNOSIS — M6281 Muscle weakness (generalized): Secondary | ICD-10-CM | POA: Diagnosis not present

## 2022-10-20 DIAGNOSIS — M25561 Pain in right knee: Secondary | ICD-10-CM | POA: Diagnosis not present

## 2022-10-20 DIAGNOSIS — R2681 Unsteadiness on feet: Secondary | ICD-10-CM

## 2022-10-20 DIAGNOSIS — M8588 Other specified disorders of bone density and structure, other site: Secondary | ICD-10-CM | POA: Diagnosis not present

## 2022-10-20 DIAGNOSIS — M958 Other specified acquired deformities of musculoskeletal system: Secondary | ICD-10-CM | POA: Diagnosis not present

## 2022-10-20 DIAGNOSIS — E349 Endocrine disorder, unspecified: Secondary | ICD-10-CM | POA: Diagnosis not present

## 2022-10-20 DIAGNOSIS — M8580 Other specified disorders of bone density and structure, unspecified site: Secondary | ICD-10-CM

## 2022-10-20 DIAGNOSIS — M25562 Pain in left knee: Secondary | ICD-10-CM | POA: Diagnosis not present

## 2022-10-20 NOTE — Therapy (Signed)
OUTPATIENT PHYSICAL THERAPY LOWER EXTREMITY TREATMENT   Patient Name: Cheryl [name] MRN: [id number] DOB:February 29, 1934, 88 y.o., female Today's Date: 10/20/2022  END OF SESSION:  PT End of Session - 10/20/22 1126     Visit Number 5    Number of Visits 13    Date for PT Re-Evaluation 11/16/22    Authorization Type [employer] MCR    Authorization Time Period 10/05/22 to 11/16/22    Progress Note Due on Visit 10    PT Start Time 1102    PT Stop Time 1140    PT Time Calculation (min) 38 min    Activity Tolerance Patient tolerated treatment well    Behavior During Therapy [facility] for tasks assessed/performed                Past Medical History:  Diagnosis Date   Arthritis    CAD (coronary artery disease)    Mild per remote cath in 2004; normal nuclear in 2012   Carotid artery disease (HCC)    Chronic kidney disease    stage3 kidney disease    Complication of anesthesia    slow to wake up   Diastolic dysfunction    Grade I, per echo in 2012   GERD (gastroesophageal reflux disease)    prevacid   H/O hiatal hernia    Headache(784.0)    sinus   High risk medication use    Flecainide therapy   History of heartburn    Hyperlipidemia    Hypertension    LVH (left ventricular hypertrophy)    Per echo in July 2012   Normal nuclear stress test 08/07/2010   Obesity    PAF (paroxysmal atrial fibrillation) (HCC)    Pneumonia    20 yrs ago   Syncope 08/07/2010   Past Surgical History:  Procedure Laterality Date   APPENDECTOMY  1952   BACK SURGERY  1980, 2010   x2   CARDIAC CATHETERIZATION  07/28/2002   EF GREATER THAN 55%; MILD CAD   COLONOSCOPY     DILATION AND CURETTAGE OF UTERUS     LUMBAR LAMINECTOMY/DECOMPRESSION MICRODISCECTOMY Right 02/15/2015   Procedure: Right Thoracic twelve-Lumbar one  Microdiskectomy;  Surgeon: Tressie Stalker, MD;  Location: [facility];  Service: Neurosurgery;  Laterality: Right;  Thoracic/Lumbar   TONSILLECTOMY     age 88 yrs   TUBAL LIGATION      Patient Active Problem List   Diagnosis Date Noted   Right knee DJD 11/25/2019   Chondrocalcinosis due to dicalcium phosphate crystals, of the knee, right 11/25/2019   Acute CVA (cerebrovascular accident) (HCC) 11/10/2017   Hypertensive emergency 11/10/2017   S/P insertion of spinal cord stimulator 10/11/2017   Chronic pain syndrome 10/11/2017   Post laminectomy syndrome 10/11/2017   Radiculopathy, lumbar region 10/11/2017   Headache 02/23/2017   Nausea & vomiting 02/23/2017   Hyponatremia 02/23/2017   Hypertensive urgency 02/23/2017   Lumbar herniated disc 02/15/2015   CKD (chronic kidney disease) stage 3, GFR 30-59 ml/min (HCC) 06/22/2014   Spondylolisthesis of lumbar region 08/07/2013   Hyperlipidemia 06/13/2008   PAF (paroxysmal atrial fibrillation) (HCC) 06/13/2008   Essential hypertension 11/05/2006   Coronary atherosclerosis 11/05/2006    PCP: Laurann Montana MD   REFERRING PROVIDER: Persons, [place], [place]  REFERRING DIAG: M25.561,M25.562,G89.29 (ICD-10-CM) - Chronic pain of both knees  THERAPY DIAG:  Chronic pain of right knee  Unsteadiness on feet  Chronic pain of left knee  Muscle weakness (generalized)  Rationale for Evaluation and Treatment:  Rehabilitation  ONSET DATE: chronic   SUBJECTIVE:   SUBJECTIVE STATEMENT: Didn't sleep last night so might be slow today.  No problems with blood pressure since last time.   PERTINENT HISTORY: OA, CAD, CKD, grade 1 diastolic dysfunction, hx hiatial hernia, HA, HLD, HTN, LVH, obesity, PAF, syncope, back surgery, lumbar laminectomy/microdiscectomy PAIN:  Are you having pain? Yes: NPRS scale: 0/10 Pain location: B knees, R>L Pain description: "Stiff"  Aggravating factors: standing/WB  Relieving factors: sitting down/not being in WB too much   PRECAUTIONS: Fall  RED FLAGS: None   WEIGHT BEARING RESTRICTIONS: No  FALLS:  Has patient fallen in last 6 months? Yes. Number of falls 2- one in the house, one out  at the mailbox   LIVING ENVIRONMENT: Lives with: lives with their family and lives alone Lives in: House/apartment Stairs: level entry/home  Has following equipment at home: Single point cane and Environmental consultant - 2 wheeled  OCCUPATION: retired   PLOF: Independent, Independent with basic ADLs, Independent with gait, Independent with transfers, and Requires assistive device for independence  PATIENT GOALS: unsure, MD sent me here   NEXT MD VISIT: Referring PRN   OBJECTIVE:   DIAGNOSTIC FINDINGS:   Radiographs of the right knee demonstrate advanced tricompartmental  arthritis most notable in the medial compartment no acute osseous injuries  also evidence of chondrocalcinosis  PATIENT SURVEYS:  FOTO 2nd session  COGNITION: Overall cognitive status: Within functional limits for tasks assessed    LOWER EXTREMITY ROM:  Active ROM Right eval Left eval  Hip flexion    Hip extension    Hip abduction    Hip adduction    Hip internal rotation    Hip external rotation    Knee flexion 124* p! 105* p!  Knee extension -9* -7*  Ankle dorsiflexion    Ankle plantarflexion    Ankle inversion    Ankle eversion     (Blank rows = not tested)  LOWER EXTREMITY MMT:  MMT Right eval Left eval  Hip flexion 3 3  Hip extension    Hip abduction 2 2  Hip adduction    Hip internal rotation    Hip external rotation    Knee flexion 3 3  Knee extension 4- 4-  Ankle dorsiflexion 4+ 4  Ankle plantarflexion    Ankle inversion    Ankle eversion     (Blank rows = not tested)  FUNCTIONAL TESTS:  Berg Balance Scale: 33  GAIT: Distance walked: in clinic distances  Assistive device utilized: Single point cane Level of assistance: Modified independence Comments: knees flexed/limited extension ROM, flexed at hips, generally antalgic   TODAY'S TREATMENT:                                                                                                                              DATE:   BP:150/78 LAQ 2lb 3x10 HS curls red 2x12 Seated hip abd against red Tband 2 x 10 reps Seated  hip add against ball, 5 sec x 10, 2 sets Nustep L 5, Ue's and LE's 6 min  In ll bars for B heel raises 10 reps  In ll bars for mini squats S2S 2x5 with UE use   10/17/22 BP 156/85 NuStep L3 x 6 minutes LAQ 2lb 2x12 HS curls red 2x12 Seated hip abd against red Tband 2 x 10 reps Seated hip add against ball, 5 sec x 10 S2S 2x5 with UE use    10/12/22 NuStep L3 x 6 minutes Seated knee ext with 2# resistance. 2 x 10 reps each Seated knee flex against Red tband, 2 x 10 Seated hip add against ball, 5 sec x 10 Seated hip abd against red Tband 2 x 10 reps Ambulated without her cane-L lateral trunk flexion noted with R hip pelvic obliquity-She reports this is a chronic condition due to previous back surgeries. Seated trunk rotation-place both hands to the R and slide hands away from trunk, return, repeat to L. Also seated pelvic tilts Standing heel raises with BUE support, 2 x 10 reps Back stretch, flexion over blue physioball, forward, then to each side x 5 with hold for deep breath.  10/10/22 NuStep L3 x 6 minutes Supine Strengthening-SLR, SLR with hip ER, Both irritated hip pain, 10 each B knee press with small towel roll under knees due to pain without any support, 5 sec holds x 10 reps, clamshells with G tband x 10, B bridge with active trunk stabilization x 5 reps. Supine with legs over physioball stretching, DKTC, glut stretch on each side. Supine- practiced glut stretch, LTR without ball for HEP Supine strength- pelvic tilt into bridge x 10. Seated HS stretch  Eval Objective findings, POC, HEP   TherEx  Seated hip ABD red TB x10 Seated marches red TB x10 B alternating Seated LAQs red TB x5 B Seated HS curls red TB x5 B     PATIENT EDUCATION:  Education details: exam findings, HEP, POC, role of strengthening hips and knees in reducing knee pain  Person educated:  Patient Education method: Explanation, Demonstration, and Handouts Education comprehension: verbalized understanding, returned demonstration, and needs further education  HOME EXERCISE PROGRAM: Access Code: [email or web address] URL: [email or web address] Date: 10/05/2022 Prepared by: Nedra Hai  Exercises - Seated Hip Abduction with Resistance  - 1 x daily - 7 x weekly - 2 sets - 10 reps - 2 seconds  hold - Seated March with Resistance  - 1 x daily - 7 x weekly - 2 sets - 10 reps - 1 second  hold - Seated Hamstring Curls with Resistance  - 1 x daily - 7 x weekly - 2 sets - 10 reps - 2 seconds  hold - Sitting Knee Extension with Resistance  - 1 x daily - 7 x weekly - 2 sets - 10 reps -  2 seconds  hold  ASSESSMENT:  CLINICAL IMPRESSION: Patient is a 88 y.o. F who participated with skilled physical therapy treatment  today for B knee pain. She is quite limited due to fatigue and B hip and knee pain. She also demonstrates pelvic obliquity, with R lateral trunk flexion, chronic due to previous back surgeries. Performed functional strengthening activities, monitored pain throughout Rx.  OBJECTIVE IMPAIRMENTS: Abnormal gait, decreased activity tolerance, decreased balance, decreased knowledge of use of DME, decreased mobility, difficulty walking, decreased ROM, decreased strength, decreased safety awareness, hypomobility, increased fascial restrictions, impaired flexibility, and pain.   ACTIVITY LIMITATIONS: standing, squatting, transfers, and locomotion level  PARTICIPATION LIMITATIONS:  driving, shopping, community activity, yard work, and church  PERSONAL FACTORS: Age, Fitness, Past/current experiences, and Time since onset of injury/illness/exacerbation are also affecting patient's functional outcome.   REHAB POTENTIAL: Fair chronicity of pain   CLINICAL DECISION MAKING: Stable/uncomplicated  EVALUATION COMPLEXITY: Low   GOALS: Goals reviewed with patient? No  SHORT TERM  GOALS: Target date: 10/26/2022   Will be compliant with appropriate progressive HEP  Baseline: Goal status: 10/10/22-HEP provided, ongoing  2.  B knee extension and flexion AROM to be equal and symmetrical  Baseline:  Goal status: INITIAL  3.  Will be able to maintain tandem stance for at least 10 seconds with no more than min guard  Baseline:  Goal status: INITIAL  LONG TERM GOALS: Target date: 11/16/2022   MMT to improve by one grade all weak groups  Baseline:  Goal status: INITIAL  2.  Will score at least 43 on Berg balance test to show improved functional balance/reduced fall risk  Baseline:  Goal status: INITIAL  3.  Pain in B knees to be no more than 3/10 at worst  Baseline:  Goal status: INITIAL  4.  Will be able to stand to cook a full meal without significant increase in knee pain from resting levels to improve QOL  Baseline:  Goal status: INITIAL  PLAN:  PT FREQUENCY: 2x/week  PT DURATION: 6 weeks  PLANNED INTERVENTIONS: Therapeutic exercises, Therapeutic activity, Neuromuscular re-education, Gait training, Self Care, Electrical stimulation, Cryotherapy, Moist heat, Vasopneumatic device, Ultrasound, and Manual therapy  PLAN FOR NEXT SESSION: LE strength with gentle progression to tolerance, balance   Ailis Rigaud, PT, DPT, OCS 10/20/22 11:41 AM

## 2022-10-23 ENCOUNTER — Ambulatory Visit: Payer: Medicare PPO

## 2022-10-23 ENCOUNTER — Other Ambulatory Visit: Payer: Self-pay

## 2022-10-23 DIAGNOSIS — M25562 Pain in left knee: Secondary | ICD-10-CM | POA: Diagnosis not present

## 2022-10-23 DIAGNOSIS — G8929 Other chronic pain: Secondary | ICD-10-CM

## 2022-10-23 DIAGNOSIS — M25561 Pain in right knee: Secondary | ICD-10-CM | POA: Diagnosis not present

## 2022-10-23 DIAGNOSIS — R2681 Unsteadiness on feet: Secondary | ICD-10-CM | POA: Diagnosis not present

## 2022-10-23 DIAGNOSIS — M6281 Muscle weakness (generalized): Secondary | ICD-10-CM | POA: Diagnosis not present

## 2022-10-23 NOTE — Therapy (Signed)
OUTPATIENT PHYSICAL THERAPY LOWER EXTREMITY TREATMENT   Patient Name: Cheryl [name] MRN: [id number] DOB:May 10, 1934, 88 y.o., female Today's Date: 10/23/2022  END OF SESSION:  PT End of Session - 10/23/22 1520     Visit Number 6    Number of Visits 13    Date for PT Re-Evaluation 11/16/22    Authorization Type [employer] MCR    Authorization Time Period 10/05/22 to 11/16/22    Progress Note Due on Visit 10    PT Start Time 1515    PT Stop Time 1553    PT Time Calculation (min) 38 min    Activity Tolerance Patient limited by pain;Patient limited by fatigue    Behavior During Therapy [facility] for tasks assessed/performed                 Past Medical History:  Diagnosis Date   Arthritis    CAD (coronary artery disease)    Mild per remote cath in 2004; normal nuclear in 2012   Carotid artery disease (HCC)    Chronic kidney disease    stage3 kidney disease    Complication of anesthesia    slow to wake up   Diastolic dysfunction    Grade I, per echo in 2012   GERD (gastroesophageal reflux disease)    prevacid   H/O hiatal hernia    Headache(784.0)    sinus   High risk medication use    Flecainide therapy   History of heartburn    Hyperlipidemia    Hypertension    LVH (left ventricular hypertrophy)    Per echo in July 2012   Normal nuclear stress test 08/07/2010   Obesity    PAF (paroxysmal atrial fibrillation) (HCC)    Pneumonia    20 yrs ago   Syncope 08/07/2010   Past Surgical History:  Procedure Laterality Date   APPENDECTOMY  1952   BACK SURGERY  1980, 2010   x2   CARDIAC CATHETERIZATION  07/28/2002   EF GREATER THAN 55%; MILD CAD   COLONOSCOPY     DILATION AND CURETTAGE OF UTERUS     LUMBAR LAMINECTOMY/DECOMPRESSION MICRODISCECTOMY Right 02/15/2015   Procedure: Right Thoracic twelve-Lumbar one  Microdiskectomy;  Surgeon: Tressie Stalker, MD;  Location: [facility];  Service: Neurosurgery;  Laterality: Right;  Thoracic/Lumbar   TONSILLECTOMY     age 22 yrs    TUBAL LIGATION     Patient Active Problem List   Diagnosis Date Noted   Right knee DJD 11/25/2019   Chondrocalcinosis due to dicalcium phosphate crystals, of the knee, right 11/25/2019   Acute CVA (cerebrovascular accident) (HCC) 11/10/2017   Hypertensive emergency 11/10/2017   S/P insertion of spinal cord stimulator 10/11/2017   Chronic pain syndrome 10/11/2017   Post laminectomy syndrome 10/11/2017   Radiculopathy, lumbar region 10/11/2017   Headache 02/23/2017   Nausea & vomiting 02/23/2017   Hyponatremia 02/23/2017   Hypertensive urgency 02/23/2017   Lumbar herniated disc 02/15/2015   CKD (chronic kidney disease) stage 3, GFR 30-59 ml/min (HCC) 06/22/2014   Spondylolisthesis of lumbar region 08/07/2013   Hyperlipidemia 06/13/2008   PAF (paroxysmal atrial fibrillation) (HCC) 06/13/2008   Essential hypertension 11/05/2006   Coronary atherosclerosis 11/05/2006    PCP: Laurann Montana MD   REFERRING PROVIDER: Persons, [place], [place]  REFERRING DIAG: M25.561,M25.562,G89.29 (ICD-10-CM) - Chronic pain of both knees  THERAPY DIAG:  Chronic pain of right knee  Unsteadiness on feet  Muscle weakness (generalized)  Chronic pain of left knee  Rationale  for Evaluation and Treatment: Rehabilitation  ONSET DATE: chronic   SUBJECTIVE:   SUBJECTIVE STATEMENT: I was sore for the afternoon after I saw you last week.  But got over it. Messed up my time today to come here that is why I'm so early   PERTINENT HISTORY: OA, CAD, CKD, grade 1 diastolic dysfunction, hx hiatial hernia, HA, HLD, HTN, LVH, obesity, PAF, syncope, back surgery, lumbar laminectomy/microdiscectomy PAIN:  Are you having pain? Yes: NPRS scale: 0/10 Pain location: B knees, R>L Pain description: "Stiff"  Aggravating factors: standing/WB  Relieving factors: sitting down/not being in WB too much   PRECAUTIONS: Fall  RED FLAGS: None   WEIGHT BEARING RESTRICTIONS: No  FALLS:  Has patient fallen in last 6  months? Yes. Number of falls 2- one in the house, one out at the mailbox   LIVING ENVIRONMENT: Lives with: lives with their family and lives alone Lives in: House/apartment Stairs: level entry/home  Has following equipment at home: Single point cane and Environmental consultant - 2 wheeled  OCCUPATION: retired   PLOF: Independent, Independent with basic ADLs, Independent with gait, Independent with transfers, and Requires assistive device for independence  PATIENT GOALS: unsure, MD sent me here   NEXT MD VISIT: Referring PRN   OBJECTIVE:   DIAGNOSTIC FINDINGS:   Radiographs of the right knee demonstrate advanced tricompartmental  arthritis most notable in the medial compartment no acute osseous injuries  also evidence of chondrocalcinosis  PATIENT SURVEYS:  FOTO 2nd session  COGNITION: Overall cognitive status: Within functional limits for tasks assessed    LOWER EXTREMITY ROM:  Active ROM Right eval Left eval  Hip flexion    Hip extension    Hip abduction    Hip adduction    Hip internal rotation    Hip external rotation    Knee flexion 124* p! 105* p!  Knee extension -9* -7*  Ankle dorsiflexion    Ankle plantarflexion    Ankle inversion    Ankle eversion     (Blank rows = not tested)  LOWER EXTREMITY MMT:  MMT Right eval Left eval  Hip flexion 3 3  Hip extension    Hip abduction 2 2  Hip adduction    Hip internal rotation    Hip external rotation    Knee flexion 3 3  Knee extension 4- 4-  Ankle dorsiflexion 4+ 4  Ankle plantarflexion    Ankle inversion    Ankle eversion     (Blank rows = not tested)  FUNCTIONAL TESTS:  Berg Balance Scale: 33  GAIT: Distance walked: in clinic distances  Assistive device utilized: Single point cane Level of assistance: Modified independence Comments: knees flexed/limited extension ROM, flexed at hips, generally antalgic   TODAY'S TREATMENT:                                                                                                                               DATE:  10/23/22: Therapeutic  exercise:  instructed / guided the patient in the following ex to address her LE stability, flexibility, function Nustep level 3, Ue's and LE's for 7 min Long arc quads 2 # 2 sets 10 reps each Seated hip abd/Er red t band 10 reps x 2 Seated ball squeeze hip add 10 reps 5 sec holds In ll bars for toe raises B 10 x 1 In ll bars for mini squat one set 10 Sit to stand from chair with UE support 5 x 1 set   10/20/22: BP:150/78 LAQ 2lb 3x10 HS curls red 2x12 Seated hip abd against red Tband 2 x 10 reps Seated hip add against ball, 5 sec x 10, 2 sets Nustep L 5, Ue's and LE's 6 min  In ll bars for B heel raises 10 reps  In ll bars for mini squats S2S 2x5 with UE use    10/17/22 BP 156/85 NuStep L3 x 6 minutes LAQ 2lb 2x12 HS curls red 2x12 Seated hip abd against red Tband 2 x 10 reps Seated hip add against ball, 5 sec x 10 S2S 2x5 with UE use    10/12/22 NuStep L3 x 6 minutes Seated knee ext with 2# resistance. 2 x 10 reps each Seated knee flex against Red tband, 2 x 10 Seated hip add against ball, 5 sec x 10 Seated hip abd against red Tband 2 x 10 reps Ambulated without her cane-L lateral trunk flexion noted with R hip pelvic obliquity-She reports this is a chronic condition due to previous back surgeries. Seated trunk rotation-place both hands to the R and slide hands away from trunk, return, repeat to L. Also seated pelvic tilts Standing heel raises with BUE support, 2 x 10 reps Back stretch, flexion over blue physioball, forward, then to each side x 5 with hold for deep breath.  10/10/22 NuStep L3 x 6 minutes Supine Strengthening-SLR, SLR with hip ER, Both irritated hip pain, 10 each B knee press with small towel roll under knees due to pain without any support, 5 sec holds x 10 reps, clamshells with G tband x 10, B bridge with active trunk stabilization x 5 reps. Supine with legs over physioball stretching,  DKTC, glut stretch on each side. Supine- practiced glut stretch, LTR without ball for HEP Supine strength- pelvic tilt into bridge x 10. Seated HS stretch  Eval Objective findings, POC, HEP   TherEx  Seated hip ABD red TB x10 Seated marches red TB x10 B alternating Seated LAQs red TB x5 B Seated HS curls red TB x5 B     PATIENT EDUCATION:  Education details: exam findings, HEP, POC, role of strengthening hips and knees in reducing knee pain  Person educated: Patient Education method: Explanation, Demonstration, and Handouts Education comprehension: verbalized understanding, returned demonstration, and needs further education  HOME EXERCISE PROGRAM: Access Code: [email or web address] URL: [email or web address] Date: 10/05/2022 Prepared by: Nedra Hai  Exercises - Seated Hip Abduction with Resistance  - 1 x daily - 7 x weekly - 2 sets - 10 reps - 2 seconds  hold - Seated March with Resistance  - 1 x daily - 7 x weekly - 2 sets - 10 reps - 1 second  hold - Seated Hamstring Curls with Resistance  - 1 x daily - 7 x weekly - 2 sets - 10 reps - 2 seconds  hold - Sitting Knee Extension with Resistance  - 1 x daily - 7 x weekly - 2 sets - 10 reps -  2 seconds  hold  ASSESSMENT:  CLINICAL IMPRESSION: Patient is a 88 y.o. F who participated with skilled physical therapy treatment  today for B knee pain. She is quite limited due to fatigue and B hip and knee pain, especially L knee, which has much crepitus, grinding, deformity.  She also demonstrates pelvic obliquity, with R lateral trunk flexion, chronic due to previous back surgeries. Performed functional strengthening activities, monitored pain throughout Rx.  OBJECTIVE IMPAIRMENTS: Abnormal gait, decreased activity tolerance, decreased balance, decreased knowledge of use of DME, decreased mobility, difficulty walking, decreased ROM, decreased strength, decreased safety awareness, hypomobility, increased fascial restrictions,  impaired flexibility, and pain.   ACTIVITY LIMITATIONS: standing, squatting, transfers, and locomotion level  PARTICIPATION LIMITATIONS: driving, shopping, community activity, yard work, and church  PERSONAL FACTORS: Age, Fitness, Past/current experiences, and Time since onset of injury/illness/exacerbation are also affecting patient's functional outcome.   REHAB POTENTIAL: Fair chronicity of pain   CLINICAL DECISION MAKING: Stable/uncomplicated  EVALUATION COMPLEXITY: Low   GOALS: Goals reviewed with patient? No  SHORT TERM GOALS: Target date: 10/26/2022   Will be compliant with appropriate progressive HEP  Baseline: Goal status: 10/10/22-HEP provided, ongoing  2.  B knee extension and flexion AROM to be equal and symmetrical  Baseline:  Goal status: INITIAL  3.  Will be able to maintain tandem stance for at least 10 seconds with no more than min guard  Baseline:  Goal status: INITIAL  LONG TERM GOALS: Target date: 11/16/2022   MMT to improve by one grade all weak groups  Baseline:  Goal status: INITIAL  2.  Will score at least 43 on Berg balance test to show improved functional balance/reduced fall risk  Baseline:  Goal status: INITIAL  3.  Pain in B knees to be no more than 3/10 at worst  Baseline:  Goal status: INITIAL  4.  Will be able to stand to cook a full meal without significant increase in knee pain from resting levels to improve QOL  Baseline:  Goal status: INITIAL  PLAN:  PT FREQUENCY: 2x/week  PT DURATION: 6 weeks  PLANNED INTERVENTIONS: Therapeutic exercises, Therapeutic activity, Neuromuscular re-education, Gait training, Self Care, Electrical stimulation, Cryotherapy, Moist heat, Vasopneumatic device, Ultrasound, and Manual therapy  PLAN FOR NEXT SESSION: LE strength with gentle progression to tolerance, balance   Aalaya Yadao, PT, DPT, OCS 10/23/22 3:54 PM

## 2022-10-24 ENCOUNTER — Ambulatory Visit (HOSPITAL_COMMUNITY): Payer: Medicare PPO | Attending: Physician Assistant

## 2022-10-24 DIAGNOSIS — I480 Paroxysmal atrial fibrillation: Secondary | ICD-10-CM | POA: Diagnosis not present

## 2022-10-24 DIAGNOSIS — R42 Dizziness and giddiness: Secondary | ICD-10-CM | POA: Diagnosis not present

## 2022-10-24 LAB — ECHOCARDIOGRAM COMPLETE
Area-P 1/2: 2.94 cm2
S' Lateral: 2.5 cm

## 2022-10-25 ENCOUNTER — Ambulatory Visit: Payer: Medicare PPO

## 2022-10-30 ENCOUNTER — Ambulatory Visit: Payer: Medicare PPO

## 2022-10-30 ENCOUNTER — Other Ambulatory Visit: Payer: Self-pay

## 2022-10-30 DIAGNOSIS — G8929 Other chronic pain: Secondary | ICD-10-CM

## 2022-10-30 DIAGNOSIS — M25561 Pain in right knee: Secondary | ICD-10-CM | POA: Diagnosis not present

## 2022-10-30 DIAGNOSIS — R2681 Unsteadiness on feet: Secondary | ICD-10-CM

## 2022-10-30 DIAGNOSIS — M25562 Pain in left knee: Secondary | ICD-10-CM | POA: Diagnosis not present

## 2022-10-30 DIAGNOSIS — M6281 Muscle weakness (generalized): Secondary | ICD-10-CM | POA: Diagnosis not present

## 2022-10-30 NOTE — Therapy (Signed)
OUTPATIENT PHYSICAL THERAPY LOWER EXTREMITY TREATMENT   Patient Name: Cheryl [name] MRN: [id number] DOB:17-Feb-1934, 88 y.o., female Today's Date: 10/30/2022  END OF SESSION:  PT End of Session - 10/30/22 1610     Visit Number 7    Date for PT Re-Evaluation 11/16/22    Authorization Time Period 10/05/22 to 11/16/22    Progress Note Due on Visit 10    PT Start Time 1600    PT Stop Time 1642    PT Time Calculation (min) 42 min    Activity Tolerance Patient limited by pain;Patient limited by fatigue    Behavior During Therapy [facility] for tasks assessed/performed                  Past Medical History:  Diagnosis Date   Arthritis    CAD (coronary artery disease)    Mild per remote cath in 2004; normal nuclear in 2012   Carotid artery disease (HCC)    Chronic kidney disease    stage3 kidney disease    Complication of anesthesia    slow to wake up   Diastolic dysfunction    Grade I, per echo in 2012   GERD (gastroesophageal reflux disease)    prevacid   H/O hiatal hernia    Headache(784.0)    sinus   High risk medication use    Flecainide therapy   History of heartburn    Hyperlipidemia    Hypertension    LVH (left ventricular hypertrophy)    Per echo in July 2012   Normal nuclear stress test 08/07/2010   Obesity    PAF (paroxysmal atrial fibrillation) (HCC)    Pneumonia    20 yrs ago   Syncope 08/07/2010   Past Surgical History:  Procedure Laterality Date   APPENDECTOMY  1952   BACK SURGERY  1980, 2010   x2   CARDIAC CATHETERIZATION  07/28/2002   EF GREATER THAN 55%; MILD CAD   COLONOSCOPY     DILATION AND CURETTAGE OF UTERUS     LUMBAR LAMINECTOMY/DECOMPRESSION MICRODISCECTOMY Right 02/15/2015   Procedure: Right Thoracic twelve-Lumbar one  Microdiskectomy;  Surgeon: Tressie Stalker, MD;  Location: [facility];  Service: Neurosurgery;  Laterality: Right;  Thoracic/Lumbar   TONSILLECTOMY     age 40 yrs   TUBAL LIGATION     Patient Active Problem List    Diagnosis Date Noted   Right knee DJD 11/25/2019   Chondrocalcinosis due to dicalcium phosphate crystals, of the knee, right 11/25/2019   Acute CVA (cerebrovascular accident) (HCC) 11/10/2017   Hypertensive emergency 11/10/2017   S/P insertion of spinal cord stimulator 10/11/2017   Chronic pain syndrome 10/11/2017   Post laminectomy syndrome 10/11/2017   Radiculopathy, lumbar region 10/11/2017   Headache 02/23/2017   Nausea & vomiting 02/23/2017   Hyponatremia 02/23/2017   Hypertensive urgency 02/23/2017   Lumbar herniated disc 02/15/2015   CKD (chronic kidney disease) stage 3, GFR 30-59 ml/min (HCC) 06/22/2014   Spondylolisthesis of lumbar region 08/07/2013   Hyperlipidemia 06/13/2008   PAF (paroxysmal atrial fibrillation) (HCC) 06/13/2008   Essential hypertension 11/05/2006   Coronary atherosclerosis 11/05/2006    PCP: Laurann Montana MD   REFERRING PROVIDER: Persons, [place], [place]  REFERRING DIAG: M25.561,M25.562,G89.29 (ICD-10-CM) - Chronic pain of both knees  THERAPY DIAG:  Chronic pain of right knee  Unsteadiness on feet  Muscle weakness (generalized)  Chronic pain of left knee  Rationale for Evaluation and Treatment: Rehabilitation  ONSET DATE: chronic   SUBJECTIVE:  SUBJECTIVE STATEMENT: I propped my L foot up on the coffee table this weekend and afterwards my L knee was really locked up, hard to bend.   PERTINENT HISTORY: OA, CAD, CKD, grade 1 diastolic dysfunction, hx hiatial hernia, HA, HLD, HTN, LVH, obesity, PAF, syncope, back surgery, lumbar laminectomy/microdiscectomy PAIN:  Are you having pain? Yes: NPRS scale: 0/10 Pain location: B knees, R>L Pain description: "Stiff"  Aggravating factors: standing/WB  Relieving factors: sitting down/not being in WB too much   PRECAUTIONS: Fall  RED FLAGS: None   WEIGHT BEARING RESTRICTIONS: No  FALLS:  Has patient fallen in last 6 months? Yes. Number of falls 2- one in the house, one out at the mailbox    LIVING ENVIRONMENT: Lives with: lives with their family and lives alone Lives in: House/apartment Stairs: level entry/home  Has following equipment at home: Single point cane and Environmental consultant - 2 wheeled  OCCUPATION: retired   PLOF: Independent, Independent with basic ADLs, Independent with gait, Independent with transfers, and Requires assistive device for independence  PATIENT GOALS: unsure, MD sent me here   NEXT MD VISIT: Referring PRN   OBJECTIVE:   DIAGNOSTIC FINDINGS:   Radiographs of the right knee demonstrate advanced tricompartmental  arthritis most notable in the medial compartment no acute osseous injuries  also evidence of chondrocalcinosis  PATIENT SURVEYS:  FOTO 2nd session  COGNITION: Overall cognitive status: Within functional limits for tasks assessed    LOWER EXTREMITY ROM:  Active ROM Right eval Left eval  Hip flexion    Hip extension    Hip abduction    Hip adduction    Hip internal rotation    Hip external rotation    Knee flexion 124* p! 105* p!  Knee extension -9* -7*  Ankle dorsiflexion    Ankle plantarflexion    Ankle inversion    Ankle eversion     (Blank rows = not tested)  LOWER EXTREMITY MMT:  MMT Right eval Left eval  Hip flexion 3 3  Hip extension    Hip abduction 2 2  Hip adduction    Hip internal rotation    Hip external rotation    Knee flexion 3 3  Knee extension 4- 4-  Ankle dorsiflexion 4+ 4  Ankle plantarflexion    Ankle inversion    Ankle eversion     (Blank rows = not tested)  FUNCTIONAL TESTS:  Berg Balance Scale: 33  GAIT: Distance walked: in clinic distances  Assistive device utilized: Single point cane Level of assistance: Modified independence Comments: knees flexed/limited extension ROM, flexed at hips, generally antalgic   TODAY'S TREATMENT:                                                                                                                              DATE:  10/30/22: Therapeutic  exercise: Nustep level 5 Ue's and LE's  Standing heel/toe rocks on airex to tolerance in ll bars Seated theraband hip  ER /Abd with green t band  Seated long arc quads 2#, 3 x 10 each Seated marching 2# 2 x 10 reps Standing for hip ext 10 x each leg, B UE support Standing for penguins to fatigue Standing for mini squats in ll bars    10/23/22: Therapeutic exercise:  instructed / guided the patient in the following ex to address her LE stability, flexibility, function Nustep level 3, Ue's and LE's for 7 min Long arc quads 2 # 2 sets 10 reps each Seated hip abd/Er red t band 10 reps x 2 Seated ball squeeze hip add 10 reps 5 sec holds In ll bars for toe raises B 10 x 1 In ll bars for mini squat one set 10 Sit to stand from chair with UE support 5 x 1 set   10/20/22: BP:150/78 LAQ 2lb 3x10 HS curls red 2x12 Seated hip abd against red Tband 2 x 10 reps Seated hip add against ball, 5 sec x 10, 2 sets Nustep L 5, Ue's and LE's 6 min  In ll bars for B heel raises 10 reps  In ll bars for mini squats S2S 2x5 with UE use    10/17/22 BP 156/85 NuStep L3 x 6 minutes LAQ 2lb 2x12 HS curls red 2x12 Seated hip abd against red Tband 2 x 10 reps Seated hip add against ball, 5 sec x 10 S2S 2x5 with UE use    10/12/22 NuStep L3 x 6 minutes Seated knee ext with 2# resistance. 2 x 10 reps each Seated knee flex against Red tband, 2 x 10 Seated hip add against ball, 5 sec x 10 Seated hip abd against red Tband 2 x 10 reps Ambulated without her cane-L lateral trunk flexion noted with R hip pelvic obliquity-She reports this is a chronic condition due to previous back surgeries. Seated trunk rotation-place both hands to the R and slide hands away from trunk, return, repeat to L. Also seated pelvic tilts Standing heel raises with BUE support, 2 x 10 reps Back stretch, flexion over blue physioball, forward, then to each side x 5 with hold for deep breath.  10/10/22 NuStep L3 x 6 minutes Supine  Strengthening-SLR, SLR with hip ER, Both irritated hip pain, 10 each B knee press with small towel roll under knees due to pain without any support, 5 sec holds x 10 reps, clamshells with G tband x 10, B bridge with active trunk stabilization x 5 reps. Supine with legs over physioball stretching, DKTC, glut stretch on each side. Supine- practiced glut stretch, LTR without ball for HEP Supine strength- pelvic tilt into bridge x 10. Seated HS stretch  Eval Objective findings, POC, HEP   TherEx  Seated hip ABD red TB x10 Seated marches red TB x10 B alternating Seated LAQs red TB x5 B Seated HS curls red TB x5 B     PATIENT EDUCATION:  Education details: exam findings, HEP, POC, role of strengthening hips and knees in reducing knee pain  Person educated: Patient Education method: Explanation, Demonstration, and Handouts Education comprehension: verbalized understanding, returned demonstration, and needs further education  HOME EXERCISE PROGRAM: Access Code: [email or web address] URL: [email or web address] Date: 10/05/2022 Prepared by: Nedra Hai  Exercises - Seated Hip Abduction with Resistance  - 1 x daily - 7 x weekly - 2 sets - 10 reps - 2 seconds  hold - Seated March with Resistance  - 1 x daily - 7 x weekly - 2 sets - 10 reps - 1 second  hold -  Seated Hamstring Curls with Resistance  - 1 x daily - 7 x weekly - 2 sets - 10 reps - 2 seconds  hold - Sitting Knee Extension with Resistance  - 1 x daily - 7 x weekly - 2 sets - 10 reps -  2 seconds  hold  ASSESSMENT:  CLINICAL IMPRESSION: Patient is a 88 y.o. F who participated with skilled physical therapy treatment  today for B knee pain.  Her L knee was more painful today, had to be careful/ guarded with some of the standing activities.  Performed functional strengthening activities, monitored pain throughout Rx. She is planning to obtain cuff weights to utilize at home to maintain her strength B LE's . Poor tolerance for  activity today due to pain  OBJECTIVE IMPAIRMENTS: Abnormal gait, decreased activity tolerance, decreased balance, decreased knowledge of use of DME, decreased mobility, difficulty walking, decreased ROM, decreased strength, decreased safety awareness, hypomobility, increased fascial restrictions, impaired flexibility, and pain.   ACTIVITY LIMITATIONS: standing, squatting, transfers, and locomotion level  PARTICIPATION LIMITATIONS: driving, shopping, community activity, yard work, and church  PERSONAL FACTORS: Age, Fitness, Past/current experiences, and Time since onset of injury/illness/exacerbation are also affecting patient's functional outcome.   REHAB POTENTIAL: Fair chronicity of pain   CLINICAL DECISION MAKING: Stable/uncomplicated  EVALUATION COMPLEXITY: Low   GOALS: Goals reviewed with patient? No  SHORT TERM GOALS: Target date: 10/26/2022   Will be compliant with appropriate progressive HEP  Baseline: Goal status: 10/10/22-HEP provided, ongoing  2.  B knee extension and flexion AROM to be equal and symmetrical  Baseline:  Goal status: INITIAL  3.  Will be able to maintain tandem stance for at least 10 seconds with no more than min guard  Baseline:  Goal status: INITIAL  LONG TERM GOALS: Target date: 11/16/2022   MMT to improve by one grade all weak groups  Baseline:  Goal status: INITIAL  2.  Will score at least 43 on Berg balance test to show improved functional balance/reduced fall risk  Baseline:  Goal status: INITIAL  3.  Pain in B knees to be no more than 3/10 at worst  Baseline:  Goal status: INITIAL  4.  Will be able to stand to cook a full meal without significant increase in knee pain from resting levels to improve QOL  Baseline:  Goal status: INITIAL  PLAN:  PT FREQUENCY: 2x/week  PT DURATION: 6 weeks  PLANNED INTERVENTIONS: Therapeutic exercises, Therapeutic activity, Neuromuscular re-education, Gait training, Self Care, Electrical  stimulation, Cryotherapy, Moist heat, Vasopneumatic device, Ultrasound, and Manual therapy  PLAN FOR NEXT SESSION: LE strength with gentle progression to tolerance, balance   Verner Mccrone, PT, DPT, OCS 10/30/22 4:51 PM

## 2022-11-01 ENCOUNTER — Ambulatory Visit: Payer: Medicare PPO

## 2022-11-09 DIAGNOSIS — K219 Gastro-esophageal reflux disease without esophagitis: Secondary | ICD-10-CM | POA: Diagnosis not present

## 2022-11-09 DIAGNOSIS — G8929 Other chronic pain: Secondary | ICD-10-CM | POA: Diagnosis not present

## 2022-11-09 DIAGNOSIS — Z9181 History of falling: Secondary | ICD-10-CM | POA: Diagnosis not present

## 2022-11-09 DIAGNOSIS — M25561 Pain in right knee: Secondary | ICD-10-CM | POA: Diagnosis not present

## 2022-11-09 DIAGNOSIS — E559 Vitamin D deficiency, unspecified: Secondary | ICD-10-CM | POA: Diagnosis not present

## 2022-11-09 DIAGNOSIS — Z79899 Other long term (current) drug therapy: Secondary | ICD-10-CM | POA: Diagnosis not present

## 2022-11-09 DIAGNOSIS — M129 Arthropathy, unspecified: Secondary | ICD-10-CM | POA: Diagnosis not present

## 2022-11-09 DIAGNOSIS — M25562 Pain in left knee: Secondary | ICD-10-CM | POA: Diagnosis not present

## 2022-11-09 DIAGNOSIS — Z131 Encounter for screening for diabetes mellitus: Secondary | ICD-10-CM | POA: Diagnosis not present

## 2022-11-09 DIAGNOSIS — M48061 Spinal stenosis, lumbar region without neurogenic claudication: Secondary | ICD-10-CM | POA: Diagnosis not present

## 2022-11-09 DIAGNOSIS — Z6824 Body mass index (BMI) 24.0-24.9, adult: Secondary | ICD-10-CM | POA: Diagnosis not present

## 2022-11-09 DIAGNOSIS — N1831 Chronic kidney disease, stage 3a: Secondary | ICD-10-CM | POA: Diagnosis not present

## 2022-11-13 DIAGNOSIS — Z79899 Other long term (current) drug therapy: Secondary | ICD-10-CM | POA: Diagnosis not present

## 2022-12-12 NOTE — Progress Notes (Unsigned)
Cardiology Office Note    Date:  12/14/2022  ID:  Cheryl [name], DOB 03/19/1934, MRN [id number] PCP:  Laurann Montana, MD  Cardiologist:  Donato Schultz, MD  Electrophysiologist:  None   Chief Complaint: f/u atrial fibrillation, dizziness  History of Present Illness: .    Cheryl [name] is a 88 y.o. female with visit-pertinent history of PAF, baseline sinus bradycardia with first degree AVB + LAFB, stroke, HLD (managed by PCP), mild nonobstructive CAD by remote cath 2004, mild carotid artery plaque bilaterally 2021 with FMD by CT 2019, cerebral aneurysm, moderate TR, hyponatremia, arthritis, GERD, hiatal hernia, CKD 3b, pulmonary nodules & prior aortic ectasia followed by PCP seen for follow-up.    Prior cath 2004 showed nonobstructive CAD in LAD, LCX, RCA. Last ischemic assessment was via nuc in 2012 that was normal. In 2019 she was found to have stroke and new afib. Neurology note referenced that CT 2019 was "positive also for bilateral ICA and distal vertebral artery Fibromuscular Dysplasia (FMD). Distal cervical Right ICA involvement including fusiform aneurysmal enlargement of the vessel 7-9 mm diameter. 3. Positive also for a small 2-3 mm aneurysm versus infundibulum of the distal Left Vertebral Artery directed posteriorly as it crosses the dura." She was started on Eliquis. F/u neuro note 2019 stated, "Due to findings of bilateral ICA FMD and right ICA fusiform dilation, recommended to follow-up with Dr. Fatima Sanger outpatient for continued monitoring as IR unable to perform any intervention at this time." As of last neurology OV 2020, they encouraged continue anticoagulation. Carotid US in 2021 suggested that distal internal carotid and vertebral artery fibromuscular dysplasia was not evident sonographically. There is mention of prior pulmonary HTN in the chart; PASP by last echo had normalized. She has been treated with lower dose statin due to patient preference. In 12/2021 metoprolol dose was  reduced due to SB in the 40s. Dr. Anne Fu had been aware of the unconventional once-daily flecainide dosing and had continued since she had done well on this. Her PCP had been following pulmonary nodules and aortic ectasia, with last CT 04/2022 demonstrating the nodules (recommendation for f/u 12-18 months in high risk but considered optimal in low risk patients) and no aortic aneurysm. When I saw her 09/2022 he was having episodic fleeting episodes of dizziness with negative orthostatics. I had reviewed case and EKG with Dr. Anne Fu and we elected to decrease her metoprolol as a trial and follow symptoms. I also recommended she follow-up with Dr. Corliss Skains whether repeat angiographic surveillance would be warranted for her FMD/cerebral aneurysm. Repeat echo 10/2022 EF 65-70%, moderate TR.  She is seen for follow-up back with her daughter today. They report she has been doing great. When our office called her to decrease her metoprolol to 1/2 tablet BID, they misunderstood this as adding a 1/2 tablet to the existing 1 tablet twice a day. She felt understandably worse with this so went back to the original 1 tablet (25mg ) twice a day and has been doing great. She has not had any recurrent dizziness. No palpitations, chest pain, new dyspnea, edema, orthopnea, syncope, or bleeding. She sees her PCP every 6 months and will see her again in March.  Labwork independently reviewed: 09/2022 TSH wnl, Hgb 12.5, Mg 1.8 (acceptable per EP), K 4.6, Cr 1.31  ROS: .    Please see the history of present illness.  All other systems are reviewed and otherwise negative.  Studies Reviewed: Marland Kitchen    EKG:  EKG is ordered today,  personally reviewed, demonstrating SB 54bpm 1st degree AVB, LAFB, prior septal infarct pattern, QTc , QRS duration . Stable compared to prior.  CV Studies: Cardiac studies reviewed are outlined and summarized above. Otherwise please see EMR for full report.   Current Reported Medications:.     Current Meds  Medication Sig   alendronate (FOSAMAX) 70 MG tablet Take 70 mg by mouth once a week.   amLODipine (NORVASC) 5 MG tablet Take 5 mg by mouth daily.   Biotin 10 MG CAPS Take 10 mg by mouth daily.   cyclobenzaprine (FLEXERIL) 5 MG tablet Take 1 tablet (5 mg total) by mouth 3 (three) times daily as needed for muscle spasms.   ELIQUIS 5 MG TABS tablet TAKE 1 TABLET BY MOUTH TWICE DAILY   famotidine (PEPCID) 20 MG tablet Take 20 mg by mouth at bedtime as needed.   flecainide (TAMBOCOR) 50 MG tablet Take 1 tablet (50 mg total) by mouth daily.   HYDROcodone-acetaminophen (NORCO/VICODIN) 5-325 MG tablet Take 1 tablet by mouth 3 (three) times daily as needed.   loratadine (CLARITIN) 10 MG tablet Take 10 mg by mouth daily as needed for allergies.   metoprolol tartrate (LOPRESSOR) 25 MG tablet Take 25 mg by mouth 2 (two) times daily. Pt takes 1 tablet bid.   rosuvastatin (CRESTOR) 10 MG tablet Take 10 mg by mouth 2 (two) times a week.   traZODone (DESYREL) 100 MG tablet Take 1 tablet (100 mg total) by mouth at bedtime as needed for sleep.    Physical Exam:    VS:  BP 120/68   Pulse (!) 58   Ht 5' 3.5" (1.613 m)   Wt 146 lb (66.2 kg)   SpO2 95%   BMI 25.46 kg/m    Wt Readings from Last 3 Encounters:  12/14/22 146 lb (66.2 kg)  10/02/22 146 lb (66.2 kg)  09/13/22 145 lb (65.8 kg)    GEN: Well nourished, well developed in no acute distress NECK: No JVD; No carotid bruits CARDIAC: RRR, no murmurs, rubs, gallops RESPIRATORY:  Clear to auscultation without rales, wheezing or rhonchi  ABDOMEN: Soft, non-tender, non-distended EXTREMITIES:  No edema; No acute deformity   Asessement and Plan:.    1. Dizziness - completely resolved. See above regarding metoprolol adjustment. Since she is doing great on the 25mg  twice daily dose of metoprolol, I would favor we continue for now. Will update rx on file. They will notify for any recurrent symptoms.  2. PAF, LAFB, sinus bradycardia -  EKG is stable to the one from August which was reviewed with Dr. Anne Fu. She has been on unconventional dose of flecainide of 50mg  once daily chronically but it appears those dose is actually appropriate for her renal function therefore we will continue. OK to refill today. We will need to continue to re-evaluate her candidacy for continuation of this medicine in follow-up. If she ever develops progressive CAD or structural LV dysfunction, will need an alternative. Eliquis dose of 5mg  BID remains appropriate but would continue to re-evaluate at each OV given age >9. Most recent Cr have been 1.3 range. She reports she'll have this checked again with primary care in March and I asked her to have their office send [place] a copy of labs.  3. CAD - no recent anginal symptoms. No ASA given concomitant Eliquis. Lipids are managed by PCP.  4. Mild carotid plaque - mild disease by CT 2021. No carotid bruit on examination; no interim stroke symptoms. At last OV I  advised following up with Dr. Corliss Skains given prior commentary regarding aneurysm formation. She also carries hx of FMD that was not seen on last carotid duplex. Continue statin therapy. Lipids are managed by PCP.   5. Moderate TR - follow clinically. Consider repeat echo in 1-2 years or as clinically dictated by symptoms.     Disposition: F/u with Dr. Anne Fu in 6 months.  Signed, Laurann Montana, PA-C

## 2022-12-14 ENCOUNTER — Encounter: Payer: Self-pay | Admitting: Physician Assistant

## 2022-12-14 ENCOUNTER — Ambulatory Visit: Payer: Medicare PPO | Attending: Physician Assistant | Admitting: Physician Assistant

## 2022-12-14 VITALS — BP 120/68 | HR 58 | Ht 63.5 in | Wt 146.0 lb

## 2022-12-14 DIAGNOSIS — I2510 Atherosclerotic heart disease of native coronary artery without angina pectoris: Secondary | ICD-10-CM

## 2022-12-14 DIAGNOSIS — I480 Paroxysmal atrial fibrillation: Secondary | ICD-10-CM

## 2022-12-14 DIAGNOSIS — R42 Dizziness and giddiness: Secondary | ICD-10-CM

## 2022-12-14 DIAGNOSIS — I444 Left anterior fascicular block: Secondary | ICD-10-CM

## 2022-12-14 DIAGNOSIS — I779 Disorder of arteries and arterioles, unspecified: Secondary | ICD-10-CM

## 2022-12-14 DIAGNOSIS — I071 Rheumatic tricuspid insufficiency: Secondary | ICD-10-CM | POA: Diagnosis not present

## 2022-12-14 MED ORDER — FLECAINIDE ACETATE 50 MG PO TABS: 50.0000 mg | ORAL_TABLET | Freq: Every day | ORAL | 1 refills | Status: DC

## 2022-12-14 MED ORDER — METOPROLOL TARTRATE 25 MG PO TABS: 25.0000 mg | ORAL_TABLET | Freq: Two times a day (BID) | ORAL | 5 refills | Status: DC

## 2022-12-14 NOTE — Patient Instructions (Signed)
Medication Instructions:  CONTINUE Taking Metoprolol 25mg  Twice daily  *If you need a refill on your cardiac medications before your next appointment, please call your pharmacy*   Lab Work: None ordered   Testing/Procedures: None ordered   Follow-Up: At [facility], you and your health needs are our priority.  As part of our continuing mission to provide you with exceptional heart care, we have created designated Provider Care Teams.  These Care Teams include your primary Cardiologist (physician) and Advanced Practice Providers (APPs -  Physician Assistants and Nurse Practitioners) who all work together to provide you with the care you need, when you need it.  We recommend signing up for the patient portal called "MyChart".  Sign up information is provided on this After Visit Summary.  MyChart is used to connect with patients for Virtual Visits (Telemedicine).  Patients are able to view lab/test results, encounter notes, upcoming appointments, etc.  Non-urgent messages can be sent to your provider as well.   To learn more about what you can do with MyChart, go to [email or web address].    Your next appointment:   6 month(s)  Provider:   Donato Schultz, MD     Other Instructions

## 2023-01-02 DIAGNOSIS — K219 Gastro-esophageal reflux disease without esophagitis: Secondary | ICD-10-CM | POA: Diagnosis not present

## 2023-01-02 DIAGNOSIS — G8929 Other chronic pain: Secondary | ICD-10-CM | POA: Diagnosis not present

## 2023-01-02 DIAGNOSIS — Z79899 Other long term (current) drug therapy: Secondary | ICD-10-CM | POA: Diagnosis not present

## 2023-01-02 DIAGNOSIS — Z6824 Body mass index (BMI) 24.0-24.9, adult: Secondary | ICD-10-CM | POA: Diagnosis not present

## 2023-01-02 DIAGNOSIS — N1831 Chronic kidney disease, stage 3a: Secondary | ICD-10-CM | POA: Diagnosis not present

## 2023-01-02 DIAGNOSIS — K5900 Constipation, unspecified: Secondary | ICD-10-CM | POA: Diagnosis not present

## 2023-01-02 DIAGNOSIS — M48061 Spinal stenosis, lumbar region without neurogenic claudication: Secondary | ICD-10-CM | POA: Diagnosis not present

## 2023-01-02 DIAGNOSIS — Z9181 History of falling: Secondary | ICD-10-CM | POA: Diagnosis not present

## 2023-01-02 DIAGNOSIS — M25562 Pain in left knee: Secondary | ICD-10-CM | POA: Diagnosis not present

## 2023-01-02 DIAGNOSIS — M25561 Pain in right knee: Secondary | ICD-10-CM | POA: Diagnosis not present

## 2023-01-08 DIAGNOSIS — Z79899 Other long term (current) drug therapy: Secondary | ICD-10-CM | POA: Diagnosis not present

## 2023-03-02 DIAGNOSIS — K5900 Constipation, unspecified: Secondary | ICD-10-CM | POA: Diagnosis not present

## 2023-03-02 DIAGNOSIS — M25562 Pain in left knee: Secondary | ICD-10-CM | POA: Diagnosis not present

## 2023-03-02 DIAGNOSIS — G8929 Other chronic pain: Secondary | ICD-10-CM | POA: Diagnosis not present

## 2023-03-02 DIAGNOSIS — I4891 Unspecified atrial fibrillation: Secondary | ICD-10-CM | POA: Diagnosis not present

## 2023-03-02 DIAGNOSIS — M48061 Spinal stenosis, lumbar region without neurogenic claudication: Secondary | ICD-10-CM | POA: Diagnosis not present

## 2023-03-02 DIAGNOSIS — N1831 Chronic kidney disease, stage 3a: Secondary | ICD-10-CM | POA: Diagnosis not present

## 2023-03-02 DIAGNOSIS — K219 Gastro-esophageal reflux disease without esophagitis: Secondary | ICD-10-CM | POA: Diagnosis not present

## 2023-03-02 DIAGNOSIS — M25561 Pain in right knee: Secondary | ICD-10-CM | POA: Diagnosis not present

## 2023-03-02 DIAGNOSIS — Z79899 Other long term (current) drug therapy: Secondary | ICD-10-CM | POA: Diagnosis not present

## 2023-03-06 DIAGNOSIS — Z79899 Other long term (current) drug therapy: Secondary | ICD-10-CM | POA: Diagnosis not present

## 2023-04-02 DIAGNOSIS — M25562 Pain in left knee: Secondary | ICD-10-CM | POA: Diagnosis not present

## 2023-04-02 DIAGNOSIS — M1712 Unilateral primary osteoarthritis, left knee: Secondary | ICD-10-CM | POA: Diagnosis not present

## 2023-04-12 DIAGNOSIS — I480 Paroxysmal atrial fibrillation: Secondary | ICD-10-CM | POA: Diagnosis not present

## 2023-04-12 DIAGNOSIS — F325 Major depressive disorder, single episode, in full remission: Secondary | ICD-10-CM | POA: Diagnosis not present

## 2023-04-12 DIAGNOSIS — B0229 Other postherpetic nervous system involvement: Secondary | ICD-10-CM | POA: Diagnosis not present

## 2023-04-12 DIAGNOSIS — I77819 Aortic ectasia, unspecified site: Secondary | ICD-10-CM | POA: Diagnosis not present

## 2023-04-12 DIAGNOSIS — I69352 Hemiplegia and hemiparesis following cerebral infarction affecting left dominant side: Secondary | ICD-10-CM | POA: Diagnosis not present

## 2023-04-12 DIAGNOSIS — E785 Hyperlipidemia, unspecified: Secondary | ICD-10-CM | POA: Diagnosis not present

## 2023-04-12 DIAGNOSIS — M8588 Other specified disorders of bone density and structure, other site: Secondary | ICD-10-CM | POA: Diagnosis not present

## 2023-04-12 DIAGNOSIS — Z0000 Encounter for general adult medical examination without abnormal findings: Secondary | ICD-10-CM | POA: Diagnosis not present

## 2023-04-12 DIAGNOSIS — N183 Chronic kidney disease, stage 3 unspecified: Secondary | ICD-10-CM | POA: Diagnosis not present

## 2023-04-12 DIAGNOSIS — I129 Hypertensive chronic kidney disease with stage 1 through stage 4 chronic kidney disease, or unspecified chronic kidney disease: Secondary | ICD-10-CM | POA: Diagnosis not present

## 2023-04-20 DIAGNOSIS — M1712 Unilateral primary osteoarthritis, left knee: Secondary | ICD-10-CM | POA: Diagnosis not present

## 2023-04-27 DIAGNOSIS — M1712 Unilateral primary osteoarthritis, left knee: Secondary | ICD-10-CM | POA: Diagnosis not present

## 2023-05-04 DIAGNOSIS — M1712 Unilateral primary osteoarthritis, left knee: Secondary | ICD-10-CM | POA: Diagnosis not present

## 2023-05-09 ENCOUNTER — Inpatient Hospital Stay (HOSPITAL_COMMUNITY)
Admission: EM | Admit: 2023-05-09 | Discharge: 2023-05-17 | DRG: 417 | Disposition: A | Discharge status: Home Health | Attending: Internal Medicine | Admitting: Internal Medicine

## 2023-05-09 DIAGNOSIS — N182 Chronic kidney disease, stage 2 (mild): Secondary | ICD-10-CM | POA: Diagnosis present

## 2023-05-09 DIAGNOSIS — K219 Gastro-esophageal reflux disease without esophagitis: Secondary | ICD-10-CM | POA: Diagnosis present

## 2023-05-09 DIAGNOSIS — Z743 Need for continuous supervision: Secondary | ICD-10-CM | POA: Diagnosis not present

## 2023-05-09 DIAGNOSIS — Z823 Family history of stroke: Secondary | ICD-10-CM | POA: Diagnosis not present

## 2023-05-09 DIAGNOSIS — K859 Acute pancreatitis without necrosis or infection, unspecified: Secondary | ICD-10-CM | POA: Diagnosis present

## 2023-05-09 DIAGNOSIS — R933 Abnormal findings on diagnostic imaging of other parts of digestive tract: Secondary | ICD-10-CM | POA: Diagnosis not present

## 2023-05-09 DIAGNOSIS — R7881 Bacteremia: Secondary | ICD-10-CM | POA: Diagnosis present

## 2023-05-09 DIAGNOSIS — Z6824 Body mass index (BMI) 24.0-24.9, adult: Secondary | ICD-10-CM

## 2023-05-09 DIAGNOSIS — K8010 Calculus of gallbladder with chronic cholecystitis without obstruction: Principal | ICD-10-CM | POA: Diagnosis present

## 2023-05-09 DIAGNOSIS — R1011 Right upper quadrant pain: Secondary | ICD-10-CM | POA: Diagnosis not present

## 2023-05-09 DIAGNOSIS — I129 Hypertensive chronic kidney disease with stage 1 through stage 4 chronic kidney disease, or unspecified chronic kidney disease: Secondary | ICD-10-CM | POA: Diagnosis present

## 2023-05-09 DIAGNOSIS — Z8673 Personal history of transient ischemic attack (TIA), and cerebral infarction without residual deficits: Secondary | ICD-10-CM | POA: Diagnosis not present

## 2023-05-09 DIAGNOSIS — Z801 Family history of malignant neoplasm of trachea, bronchus and lung: Secondary | ICD-10-CM

## 2023-05-09 DIAGNOSIS — R112 Nausea with vomiting, unspecified: Secondary | ICD-10-CM | POA: Diagnosis not present

## 2023-05-09 DIAGNOSIS — B9689 Other specified bacterial agents as the cause of diseases classified elsewhere: Secondary | ICD-10-CM | POA: Diagnosis present

## 2023-05-09 DIAGNOSIS — E876 Hypokalemia: Secondary | ICD-10-CM | POA: Diagnosis present

## 2023-05-09 DIAGNOSIS — I951 Orthostatic hypotension: Secondary | ICD-10-CM | POA: Diagnosis present

## 2023-05-09 DIAGNOSIS — T3695XA Adverse effect of unspecified systemic antibiotic, initial encounter: Secondary | ICD-10-CM | POA: Diagnosis present

## 2023-05-09 DIAGNOSIS — Z7901 Long term (current) use of anticoagulants: Secondary | ICD-10-CM

## 2023-05-09 DIAGNOSIS — K521 Toxic gastroenteritis and colitis: Secondary | ICD-10-CM | POA: Diagnosis present

## 2023-05-09 DIAGNOSIS — I2510 Atherosclerotic heart disease of native coronary artery without angina pectoris: Secondary | ICD-10-CM | POA: Diagnosis present

## 2023-05-09 DIAGNOSIS — M545 Low back pain, unspecified: Secondary | ICD-10-CM | POA: Diagnosis not present

## 2023-05-09 DIAGNOSIS — I480 Paroxysmal atrial fibrillation: Secondary | ICD-10-CM | POA: Diagnosis present

## 2023-05-09 DIAGNOSIS — E669 Obesity, unspecified: Secondary | ICD-10-CM | POA: Diagnosis present

## 2023-05-09 DIAGNOSIS — R109 Unspecified abdominal pain: Secondary | ICD-10-CM | POA: Diagnosis present

## 2023-05-09 DIAGNOSIS — K811 Chronic cholecystitis: Secondary | ICD-10-CM | POA: Diagnosis not present

## 2023-05-09 DIAGNOSIS — N183 Chronic kidney disease, stage 3 unspecified: Secondary | ICD-10-CM | POA: Diagnosis not present

## 2023-05-09 DIAGNOSIS — Z9689 Presence of other specified functional implants: Secondary | ICD-10-CM

## 2023-05-09 DIAGNOSIS — K8591 Acute pancreatitis with uninfected necrosis, unspecified: Secondary | ICD-10-CM | POA: Diagnosis not present

## 2023-05-09 DIAGNOSIS — B9620 Unspecified Escherichia coli [E. coli] as the cause of diseases classified elsewhere: Secondary | ICD-10-CM | POA: Diagnosis present

## 2023-05-09 DIAGNOSIS — K819 Cholecystitis, unspecified: Secondary | ICD-10-CM | POA: Diagnosis not present

## 2023-05-09 DIAGNOSIS — Z9682 Presence of neurostimulator: Secondary | ICD-10-CM

## 2023-05-09 DIAGNOSIS — E785 Hyperlipidemia, unspecified: Secondary | ICD-10-CM | POA: Diagnosis present

## 2023-05-09 DIAGNOSIS — K838 Other specified diseases of biliary tract: Secondary | ICD-10-CM | POA: Diagnosis not present

## 2023-05-09 DIAGNOSIS — K2980 Duodenitis without bleeding: Secondary | ICD-10-CM | POA: Diagnosis not present

## 2023-05-09 DIAGNOSIS — M1990 Unspecified osteoarthritis, unspecified site: Secondary | ICD-10-CM | POA: Diagnosis present

## 2023-05-09 DIAGNOSIS — R748 Abnormal levels of other serum enzymes: Secondary | ICD-10-CM | POA: Diagnosis not present

## 2023-05-09 DIAGNOSIS — R188 Other ascites: Secondary | ICD-10-CM | POA: Diagnosis not present

## 2023-05-09 DIAGNOSIS — K851 Biliary acute pancreatitis without necrosis or infection: Principal | ICD-10-CM

## 2023-05-09 DIAGNOSIS — I10 Essential (primary) hypertension: Secondary | ICD-10-CM | POA: Diagnosis present

## 2023-05-09 DIAGNOSIS — R6889 Other general symptoms and signs: Secondary | ICD-10-CM | POA: Diagnosis not present

## 2023-05-10 ENCOUNTER — Encounter (HOSPITAL_COMMUNITY): Payer: Self-pay

## 2023-05-10 ENCOUNTER — Emergency Department (HOSPITAL_COMMUNITY)

## 2023-05-10 ENCOUNTER — Other Ambulatory Visit: Payer: Self-pay

## 2023-05-10 DIAGNOSIS — K219 Gastro-esophageal reflux disease without esophagitis: Secondary | ICD-10-CM | POA: Diagnosis present

## 2023-05-10 DIAGNOSIS — E876 Hypokalemia: Secondary | ICD-10-CM | POA: Diagnosis present

## 2023-05-10 DIAGNOSIS — I480 Paroxysmal atrial fibrillation: Secondary | ICD-10-CM

## 2023-05-10 DIAGNOSIS — N182 Chronic kidney disease, stage 2 (mild): Secondary | ICD-10-CM | POA: Diagnosis present

## 2023-05-10 DIAGNOSIS — R1011 Right upper quadrant pain: Secondary | ICD-10-CM | POA: Diagnosis not present

## 2023-05-10 DIAGNOSIS — Z7901 Long term (current) use of anticoagulants: Secondary | ICD-10-CM | POA: Diagnosis not present

## 2023-05-10 DIAGNOSIS — Z8673 Personal history of transient ischemic attack (TIA), and cerebral infarction without residual deficits: Secondary | ICD-10-CM | POA: Diagnosis not present

## 2023-05-10 DIAGNOSIS — Z801 Family history of malignant neoplasm of trachea, bronchus and lung: Secondary | ICD-10-CM | POA: Diagnosis not present

## 2023-05-10 DIAGNOSIS — I10 Essential (primary) hypertension: Secondary | ICD-10-CM | POA: Diagnosis not present

## 2023-05-10 DIAGNOSIS — K8010 Calculus of gallbladder with chronic cholecystitis without obstruction: Secondary | ICD-10-CM | POA: Diagnosis present

## 2023-05-10 DIAGNOSIS — Z6824 Body mass index (BMI) 24.0-24.9, adult: Secondary | ICD-10-CM | POA: Diagnosis not present

## 2023-05-10 DIAGNOSIS — T3695XA Adverse effect of unspecified systemic antibiotic, initial encounter: Secondary | ICD-10-CM | POA: Diagnosis present

## 2023-05-10 DIAGNOSIS — I951 Orthostatic hypotension: Secondary | ICD-10-CM | POA: Diagnosis present

## 2023-05-10 DIAGNOSIS — K851 Biliary acute pancreatitis without necrosis or infection: Secondary | ICD-10-CM | POA: Diagnosis present

## 2023-05-10 DIAGNOSIS — K2980 Duodenitis without bleeding: Secondary | ICD-10-CM | POA: Diagnosis not present

## 2023-05-10 DIAGNOSIS — E785 Hyperlipidemia, unspecified: Secondary | ICD-10-CM | POA: Diagnosis present

## 2023-05-10 DIAGNOSIS — K819 Cholecystitis, unspecified: Secondary | ICD-10-CM | POA: Diagnosis not present

## 2023-05-10 DIAGNOSIS — R188 Other ascites: Secondary | ICD-10-CM | POA: Diagnosis not present

## 2023-05-10 DIAGNOSIS — K521 Toxic gastroenteritis and colitis: Secondary | ICD-10-CM | POA: Diagnosis present

## 2023-05-10 DIAGNOSIS — K859 Acute pancreatitis without necrosis or infection, unspecified: Secondary | ICD-10-CM | POA: Diagnosis present

## 2023-05-10 DIAGNOSIS — I129 Hypertensive chronic kidney disease with stage 1 through stage 4 chronic kidney disease, or unspecified chronic kidney disease: Secondary | ICD-10-CM | POA: Diagnosis present

## 2023-05-10 DIAGNOSIS — K8591 Acute pancreatitis with uninfected necrosis, unspecified: Secondary | ICD-10-CM | POA: Diagnosis not present

## 2023-05-10 DIAGNOSIS — R7881 Bacteremia: Secondary | ICD-10-CM | POA: Diagnosis present

## 2023-05-10 DIAGNOSIS — Z823 Family history of stroke: Secondary | ICD-10-CM | POA: Diagnosis not present

## 2023-05-10 DIAGNOSIS — K838 Other specified diseases of biliary tract: Secondary | ICD-10-CM | POA: Diagnosis not present

## 2023-05-10 DIAGNOSIS — R109 Unspecified abdominal pain: Secondary | ICD-10-CM | POA: Diagnosis present

## 2023-05-10 DIAGNOSIS — B9689 Other specified bacterial agents as the cause of diseases classified elsewhere: Secondary | ICD-10-CM | POA: Diagnosis present

## 2023-05-10 DIAGNOSIS — E669 Obesity, unspecified: Secondary | ICD-10-CM | POA: Diagnosis present

## 2023-05-10 DIAGNOSIS — B9620 Unspecified Escherichia coli [E. coli] as the cause of diseases classified elsewhere: Secondary | ICD-10-CM | POA: Diagnosis present

## 2023-05-10 DIAGNOSIS — M1990 Unspecified osteoarthritis, unspecified site: Secondary | ICD-10-CM | POA: Diagnosis present

## 2023-05-10 DIAGNOSIS — Z9682 Presence of neurostimulator: Secondary | ICD-10-CM | POA: Diagnosis not present

## 2023-05-10 DIAGNOSIS — I2510 Atherosclerotic heart disease of native coronary artery without angina pectoris: Secondary | ICD-10-CM | POA: Diagnosis present

## 2023-05-10 LAB — HEPATIC FUNCTION PANEL
ALT: 505 U/L — ABNORMAL HIGH (ref 0–44)
AST: 1150 U/L — ABNORMAL HIGH (ref 15–41)
Albumin: 3.1 g/dL — ABNORMAL LOW (ref 3.5–5.0)
Alkaline Phosphatase: 190 U/L — ABNORMAL HIGH (ref 38–126)
Bilirubin, Direct: 0.6 mg/dL — ABNORMAL HIGH (ref 0.0–0.2)
Indirect Bilirubin: 1.1 mg/dL — ABNORMAL HIGH (ref 0.3–0.9)
Total Bilirubin: 1.7 mg/dL — ABNORMAL HIGH (ref 0.0–1.2)
Total Protein: 5.9 g/dL — ABNORMAL LOW (ref 6.5–8.1)

## 2023-05-10 LAB — TROPONIN I (HIGH SENSITIVITY)
Troponin I (High Sensitivity): 20 ng/L — ABNORMAL HIGH (ref <18)
Troponin I (High Sensitivity): 23 ng/L — ABNORMAL HIGH (ref <18)

## 2023-05-10 LAB — BLOOD CULTURE ID PANEL (REFLEXED) - BCID2

## 2023-05-10 LAB — CBC WITH DIFFERENTIAL/PLATELET
Abs Immature Granulocytes: 0.12 10*3/uL — ABNORMAL HIGH (ref 0.00–0.07)
Abs Immature Granulocytes: 0.35 10*3/uL — ABNORMAL HIGH (ref 0.00–0.07)
Basophils Absolute: 0 10*3/uL (ref 0.0–0.1)
Basophils Absolute: 0 10*3/uL (ref 0.0–0.1)
Basophils Relative: 0 %
Basophils Relative: 0 %
Eosinophils Absolute: 0 10*3/uL (ref 0.0–0.5)
Eosinophils Absolute: 0 10*3/uL (ref 0.0–0.5)
Eosinophils Relative: 0 %
Eosinophils Relative: 0 %
HCT: 35.1 % — ABNORMAL LOW (ref 36.0–46.0)
HCT: 36.6 % (ref 36.0–46.0)
Hemoglobin: 12.2 g/dL (ref 12.0–15.0)
Hemoglobin: 12.6 g/dL (ref 12.0–15.0)
Immature Granulocytes: 1 %
Immature Granulocytes: 2 %
Lymphocytes Relative: 1 %
Lymphocytes Relative: 3 %
Lymphs Abs: 0.2 10*3/uL — ABNORMAL LOW (ref 0.7–4.0)
Lymphs Abs: 0.5 10*3/uL — ABNORMAL LOW (ref 0.7–4.0)
MCH: 32.7 pg (ref 26.0–34.0)
MCH: 33.2 pg (ref 26.0–34.0)
MCHC: 34.8 g/dL (ref 30.0–36.0)
MCHC: 34.4 g/dL (ref 30.0–36.0)
MCV: 94.1 fL (ref 80.0–100.0)
MCV: 96.3 fL (ref 80.0–100.0)
Monocytes Absolute: 1 10*3/uL (ref 0.1–1.0)
Monocytes Absolute: 0.8 10*3/uL (ref 0.1–1.0)
Monocytes Relative: 5 %
Monocytes Relative: 4 %
Neutro Abs: 17.6 10*3/uL — ABNORMAL HIGH (ref 1.7–7.7)
Neutro Abs: 18.3 10*3/uL — ABNORMAL HIGH (ref 1.7–7.7)
Neutrophils Relative %: 93 %
Neutrophils Relative %: 91 %
Platelets: 275 10*3/uL (ref 150–400)
Platelets: 273 10*3/uL (ref 150–400)
RBC: 3.73 MIL/uL — ABNORMAL LOW (ref 3.87–5.11)
RBC: 3.8 MIL/uL — ABNORMAL LOW (ref 3.87–5.11)
RDW: 12.7 % (ref 11.5–15.5)
RDW: 12.9 % (ref 11.5–15.5)
WBC: 19 10*3/uL — ABNORMAL HIGH (ref 4.0–10.5)
WBC: 20 10*3/uL — ABNORMAL HIGH (ref 4.0–10.5)
nRBC: 0 % (ref 0.0–0.2)
nRBC: 0 % (ref 0.0–0.2)

## 2023-05-10 LAB — COMPREHENSIVE METABOLIC PANEL WITH GFR
ALT: 625 U/L — ABNORMAL HIGH (ref 0–44)
AST: 1953 U/L — ABNORMAL HIGH (ref 15–41)
Albumin: 3.7 g/dL (ref 3.5–5.0)
Alkaline Phosphatase: 237 U/L — ABNORMAL HIGH (ref 38–126)
Anion gap: 11 (ref 5–15)
BUN: 22 mg/dL (ref 8–23)
CO2: 27 mmol/L (ref 22–32)
Calcium: 10.2 mg/dL (ref 8.9–10.3)
Chloride: 93 mmol/L — ABNORMAL LOW (ref 98–111)
Creatinine, Ser: 1.28 mg/dL — ABNORMAL HIGH (ref 0.44–1.00)
GFR, Estimated: 41 mL/min — ABNORMAL LOW (ref >60)
Glucose, Bld: 132 mg/dL — ABNORMAL HIGH (ref 70–99)
Potassium: 3.2 mmol/L — ABNORMAL LOW (ref 3.5–5.1)
Sodium: 131 mmol/L — ABNORMAL LOW (ref 135–145)
Total Bilirubin: 2.1 mg/dL — ABNORMAL HIGH (ref 0.0–1.2)
Total Protein: 6.7 g/dL (ref 6.5–8.1)

## 2023-05-10 LAB — BASIC METABOLIC PANEL WITH GFR
Anion gap: 10 (ref 5–15)
BUN: 19 mg/dL (ref 8–23)
CO2: 25 mmol/L (ref 22–32)
Calcium: 9.4 mg/dL (ref 8.9–10.3)
Chloride: 99 mmol/L (ref 98–111)
Creatinine, Ser: 1.2 mg/dL — ABNORMAL HIGH (ref 0.44–1.00)
GFR, Estimated: 44 mL/min — ABNORMAL LOW (ref >60)
Glucose, Bld: 127 mg/dL — ABNORMAL HIGH (ref 70–99)
Potassium: 3.5 mmol/L (ref 3.5–5.1)
Sodium: 134 mmol/L — ABNORMAL LOW (ref 135–145)

## 2023-05-10 LAB — LACTIC ACID, PLASMA
Lactic Acid, Venous: 1.5 mmol/L (ref 0.5–1.9)
Lactic Acid, Venous: 2.2 mmol/L (ref 0.5–1.9)
Lactic Acid, Venous: 1.6 mmol/L (ref 0.5–1.9)
Lactic Acid, Venous: 1.3 mmol/L (ref 0.5–1.9)

## 2023-05-10 LAB — URINALYSIS, ROUTINE W REFLEX MICROSCOPIC
Bilirubin Urine: NEGATIVE
Glucose, UA: NEGATIVE mg/dL
Hgb urine dipstick: NEGATIVE
Ketones, ur: NEGATIVE mg/dL
Leukocytes,Ua: NEGATIVE
Nitrite: NEGATIVE
Protein, ur: NEGATIVE mg/dL
Specific Gravity, Urine: 1.01 (ref 1.005–1.030)
pH: 7 (ref 5.0–8.0)

## 2023-05-10 LAB — APTT: aPTT: 123 s — ABNORMAL HIGH (ref 24–36)

## 2023-05-10 LAB — LIPASE, BLOOD: Lipase: 1066 U/L — ABNORMAL HIGH (ref 11–51)

## 2023-05-10 LAB — HEPARIN LEVEL (UNFRACTIONATED): Heparin Unfractionated: >1.1 [IU]/mL — ABNORMAL HIGH (ref 0.30–0.70)

## 2023-05-10 LAB — GLUCOSE, CAPILLARY: Glucose-Capillary: 112 mg/dL — ABNORMAL HIGH (ref 70–99)

## 2023-05-10 LAB — TRIGLYCERIDES: Triglycerides: 35 mg/dL (ref <150)

## 2023-05-10 MED ORDER — FLECAINIDE ACETATE 50 MG PO TABS
50.0000 mg | ORAL_TABLET | Freq: Every day | ORAL | Status: DC
  Administered 2023-05-10 – 2023-05-17 (×8): 50 mg via ORAL
  Filled 2023-05-10 (×8): qty 1

## 2023-05-10 MED ORDER — ONDANSETRON HCL 4 MG/2ML IJ SOLN
4.0000 mg | Freq: Once | INTRAMUSCULAR | Status: DC
  Filled 2023-05-10: qty 2

## 2023-05-10 MED ORDER — SODIUM CHLORIDE (PF) 0.9 % IJ SOLN
INTRAMUSCULAR | Status: AC
  Filled 2023-05-10: qty 50

## 2023-05-10 MED ORDER — POTASSIUM CHLORIDE 10 MEQ/100ML IV SOLN
10.0000 meq | INTRAVENOUS | Status: AC
  Administered 2023-05-10 (×3): 10 meq via INTRAVENOUS
  Filled 2023-05-10 (×3): qty 100

## 2023-05-10 MED ORDER — SODIUM CHLORIDE 0.9 % IV BOLUS
1000.0000 mL | Freq: Once | INTRAVENOUS | Status: AC
  Administered 2023-05-10: 1000 mL via INTRAVENOUS

## 2023-05-10 MED ORDER — METHOCARBAMOL 500 MG PO TABS
500.0000 mg | ORAL_TABLET | Freq: Four times a day (QID) | ORAL | Status: DC | PRN
  Administered 2023-05-10 – 2023-05-16 (×6): 500 mg via ORAL
  Filled 2023-05-10 (×7): qty 1

## 2023-05-10 MED ORDER — HEPARIN (PORCINE) 25000 UT/250ML-% IV SOLN
850.0000 [IU]/h | INTRAVENOUS | Status: DC
  Administered 2023-05-10: 800 [IU]/h via INTRAVENOUS
  Administered 2023-05-11: 650 [IU]/h via INTRAVENOUS
  Administered 2023-05-13: 850 [IU]/h via INTRAVENOUS
  Filled 2023-05-10 (×3): qty 250

## 2023-05-10 MED ORDER — SODIUM CHLORIDE 0.9 % IV SOLN
2.0000 g | INTRAVENOUS | Status: DC
  Administered 2023-05-10 – 2023-05-13 (×4): 2 g via INTRAVENOUS
  Filled 2023-05-10 (×4): qty 20

## 2023-05-10 MED ORDER — FENTANYL CITRATE PF 50 MCG/ML IJ SOSY
25.0000 ug | PREFILLED_SYRINGE | INTRAMUSCULAR | Status: AC | PRN
  Administered 2023-05-10 – 2023-05-14 (×9): 25 ug via INTRAVENOUS
  Filled 2023-05-10 (×10): qty 1

## 2023-05-10 MED ORDER — FENTANYL CITRATE PF 50 MCG/ML IJ SOSY
50.0000 ug | PREFILLED_SYRINGE | Freq: Once | INTRAMUSCULAR | Status: AC
  Administered 2023-05-10: 50 ug via INTRAVENOUS
  Filled 2023-05-10: qty 1

## 2023-05-10 MED ORDER — LACTATED RINGERS IV SOLN: INTRAVENOUS | Status: AC

## 2023-05-10 MED ORDER — METOPROLOL TARTRATE 25 MG PO TABS
25.0000 mg | ORAL_TABLET | Freq: Two times a day (BID) | ORAL | Status: DC
  Administered 2023-05-10 – 2023-05-17 (×15): 25 mg via ORAL
  Filled 2023-05-10 (×15): qty 1

## 2023-05-10 MED ORDER — PIPERACILLIN-TAZOBACTAM 3.375 G IVPB 30 MIN
3.3750 g | Freq: Once | INTRAVENOUS | Status: AC
  Administered 2023-05-10: 3.375 g via INTRAVENOUS
  Filled 2023-05-10: qty 50

## 2023-05-10 MED ORDER — LACTATED RINGERS IV BOLUS
1000.0000 mL | Freq: Once | INTRAVENOUS | Status: AC
  Administered 2023-05-10: 1000 mL via INTRAVENOUS

## 2023-05-10 MED ORDER — IOHEXOL 300 MG/ML  SOLN
75.0000 mL | Freq: Once | INTRAMUSCULAR | Status: AC | PRN
  Administered 2023-05-10: 75 mL via INTRAVENOUS

## 2023-05-10 MED ORDER — ROSUVASTATIN CALCIUM 20 MG PO TABS
10.0000 mg | ORAL_TABLET | ORAL | Status: DC
  Administered 2023-05-10: 10 mg via ORAL
  Filled 2023-05-10: qty 1

## 2023-05-10 NOTE — Consult Note (Signed)
 [facility] Consultation Note  Referring Provider: [facility] Primary Care Physician:  Laurann Montana, MD Primary Gastroenterologist:  Marshall Cork PCP  Reason for Consultation:  pancreatitis  HPI: Cheryl [name] is a 89 y.o. female presenting acute onset generalized abdominal pain.  No prior episodes.  No prior pancreatitis.  No alcohol.  Takes apixaban due to atrial fibrillation.  No blood in stool or fevers.  Imaging and labs consistent with pancreatitis, sludge GB (ultrasound) and elevated LFTs.  Her abdominal pain has improved, but not resolved, since her admission.   Past Medical History:  Diagnosis Date   Arthritis    CAD (coronary artery disease)    Mild per remote cath in 2004; normal nuclear in 2012   Carotid artery disease (HCC)    Chronic kidney disease    stage3 kidney disease    Complication of anesthesia    slow to wake up   Diastolic dysfunction    Grade I, per echo in 2012   GERD (gastroesophageal reflux disease)    prevacid   H/O hiatal hernia    Headache(784.0)    sinus   High risk medication use    Flecainide therapy   History of heartburn    Hyperlipidemia    Hypertension    LVH (left ventricular hypertrophy)    Per echo in July 2012   Normal nuclear stress test 08/07/2010   Obesity    PAF (paroxysmal atrial fibrillation) (HCC)    Pneumonia    20 yrs ago   Syncope 08/07/2010    Past Surgical History:  Procedure Laterality Date   APPENDECTOMY  1952   BACK SURGERY  1980, 2010   x2   CARDIAC CATHETERIZATION  07/28/2002   EF GREATER THAN 55%; MILD CAD   COLONOSCOPY     DILATION AND CURETTAGE OF UTERUS     LUMBAR LAMINECTOMY/DECOMPRESSION MICRODISCECTOMY Right 02/15/2015   Procedure: Right Thoracic twelve-Lumbar one  Microdiskectomy;  Surgeon: Tressie Stalker, MD;  Location: [facility];  Service: Neurosurgery;  Laterality: Right;  Thoracic/Lumbar   TONSILLECTOMY     age 70 yrs   TUBAL LIGATION      Prior to Admission  medications   Medication Sig Start Date End Date Taking? Authorizing Provider  alendronate (FOSAMAX) 70 MG tablet Take 70 mg by mouth once a week. 10/04/21   [provider]  amLODipine (NORVASC) 5 MG tablet Take 5 mg by mouth daily. 11/28/21   [provider]  Biotin 10 MG CAPS Take 10 mg by mouth daily.    [provider]  cyclobenzaprine (FLEXERIL) 5 MG tablet Take 1 tablet (5 mg total) by mouth 3 (three) times daily as needed for muscle spasms. 11/14/17   Shon Hale, MD  ELIQUIS 5 MG TABS tablet TAKE 1 TABLET BY MOUTH TWICE DAILY 09/20/22   Jake Bathe, MD  famotidine (PEPCID) 20 MG tablet Take 20 mg by mouth at bedtime as needed. 09/28/21   [provider]  flecainide (TAMBOCOR) 50 MG tablet Take 1 tablet (50 mg total) by mouth daily. 12/14/22   Dunn, Tacey Ruiz, PA-C  HYDROcodone-acetaminophen (NORCO/VICODIN) 5-325 MG tablet Take 1 tablet by mouth 3 (three) times daily as needed. 10/06/21   [provider]  loratadine (CLARITIN) 10 MG tablet Take 10 mg by mouth daily as needed for allergies.    [provider]  metoprolol tartrate (LOPRESSOR) 25 MG tablet Take 1 tablet (25 mg total) by mouth 2 (two) times daily. 12/14/22   Dunn,  Dayna N, PA-C  rosuvastatin (CRESTOR) 10 MG tablet Take 10 mg by mouth 2 (two) times a week.    [provider]  traZODone (DESYREL) 100 MG tablet Take 1 tablet (100 mg total) by mouth at bedtime as needed for sleep. 11/14/17   Shon Hale, MD    Current Facility-Administered Medications  Medication Dose Route Frequency Provider Last Rate Last Admin   fentaNYL (SUBLIMAZE) injection 25 mcg  25 mcg Intravenous Q3H PRN Eduard Clos, MD   25 mcg at 05/10/23 0834   flecainide (TAMBOCOR) tablet 50 mg  50 mg Oral Daily Eduard Clos, MD   50 mg at 05/10/23 1032   heparin ADULT infusion 100 units/mL (25000 units/239mL)  800 Units/hr Intravenous Continuous Hessie Knows, RPH 8 mL/hr at  05/10/23 9604 800 Units/hr at 05/10/23 5409   lactated ringers infusion   Intravenous Continuous Joseph Art, DO 125 mL/hr at 05/10/23 8119 New Bag at 05/10/23 1478   methocarbamol (ROBAXIN) tablet 500 mg  500 mg Oral Q6H PRN Joseph Art, DO   500 mg at 05/10/23 1203   metoprolol tartrate (LOPRESSOR) tablet 25 mg  25 mg Oral BID Eduard Clos, MD   25 mg at 05/10/23 0919    Allergies as of 05/09/2023 - Review Complete 12/14/2022  Allergen Reaction Noted   Codeine phosphate  12/27/2004   Lipitor [atorvastatin calcium]  07/12/2010   Sulfonamide derivatives  12/27/2004    Family History  Problem Relation Age of Onset   Lung cancer Father    Stroke Paternal Grandmother     Social History   Socioeconomic History   Marital status: Widowed    Spouse name: Not on file   Number of children: Not on file   Years of education: Not on file   Highest education level: Not on file  Occupational History   Not on file  Tobacco Use   Smoking status: Never   Smokeless tobacco: Never  Substance and Sexual Activity   Alcohol use: No   Drug use: No   Sexual activity: Not on file  Other Topics Concern   Not on file  Social History Narrative   Not on file   Social Drivers of Health   Financial Resource Strain: Not on file  Food Insecurity: No Food Insecurity (05/10/2023)   Hunger Vital Sign    Worried About Running Out of Food in the Last Year: Never true    Ran Out of Food in the Last Year: Never true  Transportation Needs: No Transportation Needs (05/10/2023)   PRAPARE - Administrator, Civil Service (Medical): No    Lack of Transportation (Non-Medical): No  Physical Activity: Not on file  Stress: Not on file  Social Connections: Moderately Isolated (05/10/2023)   Social Connection and Isolation Panel [NHANES]    Frequency of Communication with Friends and Family: More than three times a week    Frequency of Social Gatherings with Friends and Family: Once a week     Attends Religious Services: 1 to 4 times per year    Active Member of [employer] or Organizations: No    Attends Banker Meetings: Never    Marital Status: Widowed  Intimate Partner Violence: Not At Risk (05/10/2023)   Humiliation, Afraid, Rape, and Kick questionnaire    Fear of Current or Ex-Partner: No    Emotionally Abused: No    Physically Abused: No    Sexually Abused: No    Review  of Systems: As per HPI, all others negative  Physical Exam: Vital signs in last 24 hours: Temp:  [97.4 F (36.3 C)-98.1 F (36.7 C)] 97.9 F (36.6 C) (04/03 1149) Pulse Rate:  [54-83] 54 (04/03 1149) Resp:  [14-18] 14 (04/03 0642) BP: (103-160)/(66-90) 103/79 (04/03 1149) SpO2:  [93 %-100 %] 99 % (04/03 1149)   General:   Alert,  Well-developed, well-nourished, pleasant and cooperative in NAD Head:  Normocephalic and atraumatic. Eyes:  Sclera clear, no icterus.   Conjunctiva pink. Ears:  Modest presbyacusis Nose:  No deformity, discharge,  or lesions. Mouth:  No deformity or lesions.  Oropharynx pink but dry Neck:  Supple; no masses or thyromegaly. Lungs:  No visible respiratory distress Abdomen:  Soft, mild generalized tenderness without peritonitis, No masses, hepatosplenomegaly or hernias noted.  Msk:  Symmetrical without gross deformities. Normal posture. Pulses:  Normal pulses noted. Extremities:  Without clubbing or edema. Neurologic:  Alert and  oriented x4; diffusely weak otherwise grossly normal neurologically. Skin:  Intact without significant lesions or rashes. Psych:  Alert and cooperative. Normal mood and affect.   Lab Results: Recent Labs    05/10/23 0115 05/10/23 0713  WBC 19.0* 20.0*  HGB 12.2 12.6  HCT 35.1* 36.6  PLT 275 273   BMET Recent Labs    05/10/23 0115 05/10/23 0713  NA 131* 134*  K 3.2* 3.5  CL 93* 99  CO2 27 25  GLUCOSE 132* 127*  BUN 22 19  CREATININE 1.28* 1.20*  CALCIUM 10.2 9.4   LFT Recent Labs    05/10/23 0713  PROT 5.9*   ALBUMIN 3.1*  AST 1,150*  ALT 505*  ALKPHOS 190*  BILITOT 1.7*  BILIDIR 0.6*  IBILI 1.1*   PT/INR No results for input(s): "LABPROT", "INR" in the last 72 hours.  Studies/Results: CT ABDOMEN PELVIS W CONTRAST Result Date: 05/10/2023 CLINICAL DATA:  89 year old female with abdominal pain. EXAM: CT ABDOMEN AND PELVIS WITH CONTRAST TECHNIQUE: Multidetector CT imaging of the abdomen and pelvis was performed using the standard protocol following bolus administration of intravenous contrast. RADIATION DOSE REDUCTION: This exam was performed according to the departmental dose-optimization program which includes automated exposure control, adjustment of the mA and/or kV according to patient size and/or use of iterative reconstruction technique. CONTRAST:  75mL OMNIPAQUE IOHEXOL 300 MG/ML  SOLN COMPARISON:  Right upper quadrant ultrasound earlier today. FINDINGS: Lower chest: Borderline to mild cardiomegaly. No pericardial or pleural effusion. Plate-like, streaky bilateral lung base atelectasis. Hepatobiliary: Mild hepatic periportal edema. Liver enhancement otherwise within normal limits. Inflammation, edema at the porta hepatis. CBD mildly dilated up to 9 mm diameter (coronal image 46). No CBD filling defect. Gallbladder remains within normal limits by CT. Pancreas: Confluent inflammation and edema throughout the lesser sac with indistinct appearance of the pancreas, especially the head and uncinate. Streak artifact from lumbar spine hardware and spinal stimulator device also limit some detail of the pancreas. No main ductal dilatation is identified. No obvious mass or necrosis. Associated mildly fluid distended proximal stomach and proximal duodenum. The distal stomach and distal duodenum are both decompressed. Secondary inflammation on both structures. Small volume of free fluid emanating from the lesser sac, no drainable or organized fluid collection is identified. Spleen: Trace perisplenic free fluid.  Splenic enhancement remains normal. Adrenals/Urinary Tract: Normal adrenal glands. Nonobstructed kidneys with symmetric renal enhancement and contrast excretion 2 diminutive ureters. Stomach/Bowel: Nondilated large and small bowel throughout the abdomen and pelvis. Intermittent large bowel diverticulosis. Appendix diminutive or absent. No  pneumoperitoneum. Vascular/Lymphatic: Aortoiliac calcified atherosclerosis. Major arterial structures remain patent. Normal caliber abdominal aorta. Portal venous system including the splenic vein is patent. No lymphadenopathy identified. Reproductive: Within normal limits. Other: No pelvis free fluid. Musculoskeletal: Advanced spine degeneration superimposed on 3 level lumbar decompression and fusion. And thoracic spinal stimulator partially visible, right flank generator device. No acute osseous abnormality identified. IMPRESSION: 1. Acute Pancreatitis. Some pancreatic detail limited from artifact due to lumbar spine hardware and spinal stimulator device. No obvious pancreatic mass or necrosis. 2. Confluent inflammation, edema emanating from the lesser sac. Secondary inflammation of the stomach and duodenum. Small volume of free fluid in the abdomen but no organized or drainable fluid collection identified. 3. Inflammation at the porta hepatis with mildly dilated CBD (9 mm), and mild periportal edema. 4. Mild lung base atelectasis. Aortic Atherosclerosis (ICD10-I70.0). Electronically Signed   By: Odessa Fleming M.D.   On: 05/10/2023 04:19   US Abdomen Limited RUQ (LIVER/GB) Result Date: 05/10/2023 CLINICAL DATA:  Acute right upper quadrant pain. EXAM: ULTRASOUND ABDOMEN LIMITED RIGHT UPPER QUADRANT COMPARISON:  CTA chest including upper abdomen 12/02/2021. FINDINGS: Gallbladder: No gallstones or wall thickening visualized. No sonographic Murphy sign noted by sonographer. There is a small amount of hypoechoic layering sludge within the lumen. Common bile duct: Diameter: 6.3 mm.  No  intrahepatic bile duct dilatation. Liver: No focal lesion identified. Within normal limits in parenchymal echogenicity. Portal vein is patent on color Doppler imaging with normal direction of blood flow towards the liver. Other: None. IMPRESSION: 1. Small amount of gallbladder sludge. No gallstones or wall thickening. 2. Common bile duct 6.3 mm. Correlate with LFTs. No intrahepatic bile duct dilatation. Electronically Signed   By: Almira Bar M.D.   On: 05/10/2023 03:51    Impression:   Pancreatitis with elevated LFTs.  Acute onset symptoms.  No prior similar symptoms.  Highly consistent with gallstone pancreatitis; sludge seen in gallbladder ultrasound.  No alcohol, offending medications; triglyceride level normal. Elevated LFTs. Abdominal pain, from #1 above.  Plan:   Patient can not have MRI/MRCP. Clear liquid diet.  Judicious analgesia.  IV hydration. Follow liver enzymes, renal function and electrolytes. If feeling better again tomorrow and LFTs continue to downtrend, would advise getting surgical consultation tomorrow for consideration of cholecystectomy + IOC. [name] will follow.   LOS: 0 days   Daivik Overley M  05/10/2023, 1:11 PM  Cell [phone] If no answer or after 5 PM call [phone]

## 2023-05-10 NOTE — Progress Notes (Signed)
 PHARMACY - ANTICOAGULATION CONSULT NOTE  Pharmacy Consult for IV Heparin (PTA apixaban) Indication: atrial fibrillation  Allergies  Allergen Reactions   Codeine Phosphate     headache   Lipitor [Atorvastatin Calcium]     Muscle pain and tiredness   Sulfonamide Derivatives     REACTION: unspecified    Patient Measurements:   Wt 66kg from Nov 2024  Vital Signs: Temp: 98.1 F (36.7 C) (04/03 0642) Temp Source: Oral (04/03 0642) BP: 145/69 (04/03 1610) Pulse Rate: 75 (04/03 0642)  Labs: Recent Labs    05/10/23 0115 05/10/23 0600  HGB 12.2  --   HCT 35.1*  --   PLT 275  --   CREATININE 1.28*  --   TROPONINIHS  --  20*    CrCl cannot be calculated (Unknown ideal weight.).   Medical History: Past Medical History:  Diagnosis Date   Arthritis    CAD (coronary artery disease)    Mild per remote cath in 2004; normal nuclear in 2012   Carotid artery disease (HCC)    Chronic kidney disease    stage3 kidney disease    Complication of anesthesia    slow to wake up   Diastolic dysfunction    Grade I, per echo in 2012   GERD (gastroesophageal reflux disease)    prevacid   H/O hiatal hernia    Headache(784.0)    sinus   High risk medication use    Flecainide therapy   History of heartburn    Hyperlipidemia    Hypertension    LVH (left ventricular hypertrophy)    Per echo in July 2012   Normal nuclear stress test 08/07/2010   Obesity    PAF (paroxysmal atrial fibrillation) (HCC)    Pneumonia    20 yrs ago   Syncope 08/07/2010    Medications:  Scheduled:   flecainide  50 mg Oral Daily   metoprolol tartrate  25 mg Oral BID   rosuvastatin  10 mg Oral Once per day on Monday Thursday   Infusions:   lactated ringers 125 mL/hr at 05/10/23 0643   potassium chloride      Assessment: 89 yo admitted with acute pancreatitis on apixaban prior to admission for Afib. Per Md, to change to IV heparin. Per Md note, last apixaban dose was 4/2 in AM  Goal of Therapy:   Heparin level 0.3-0.7 units/ml aPTT 66-102 seconds Monitor platelets by anticoagulation protocol: Yes   Plan:  No IV heparin bolus Start IV heparin at rate of 800 units/hr Check aPTT/HL in 8 hours Daily CBC  Berkley Harvey 05/10/2023,6:58 AM

## 2023-05-10 NOTE — ED Provider Notes (Signed)
 [place] EMERGENCY DEPARTMENT AT [facility] Provider Note   CSN: [id number] Arrival date & time: 05/09/23  2358     History  Chief Complaint  Patient presents with   Abdominal Pain    Cheryl [name] is a 89 y.o. female.  R sided abdominal pain progressively worsening since 4pm. Thought it could be "gas". Had diarrhea 2 days ago which resolved and had a solid bowel movement today. No fever. 2 episodes of vomiting today. No pain with urination or blood in the urine. No vaginal bleeding or discharge. Previous appendectomy. On Eliquis for history of atrial fibrillation. No CP or SOB.   The history is provided by the patient.  Abdominal Pain Associated symptoms: diarrhea, nausea and vomiting   Associated symptoms: no cough, no dysuria, no fever, no hematuria and no shortness of breath        Home Medications Prior to Admission medications   Medication Sig Start Date End Date Taking? Authorizing Provider  alendronate (FOSAMAX) 70 MG tablet Take 70 mg by mouth once a week. 10/04/21   [provider]  amLODipine (NORVASC) 5 MG tablet Take 5 mg by mouth daily. 11/28/21   [provider]  Biotin 10 MG CAPS Take 10 mg by mouth daily.    [provider]  cyclobenzaprine (FLEXERIL) 5 MG tablet Take 1 tablet (5 mg total) by mouth 3 (three) times daily as needed for muscle spasms. 11/14/17   Shon Hale, MD  ELIQUIS 5 MG TABS tablet TAKE 1 TABLET BY MOUTH TWICE DAILY 09/20/22   Jake Bathe, MD  famotidine (PEPCID) 20 MG tablet Take 20 mg by mouth at bedtime as needed. 09/28/21   [provider]  flecainide (TAMBOCOR) 50 MG tablet Take 1 tablet (50 mg total) by mouth daily. 12/14/22   Dunn, Tacey Ruiz, PA-C  HYDROcodone-acetaminophen (NORCO/VICODIN) 5-325 MG tablet Take 1 tablet by mouth 3 (three) times daily as needed. 10/06/21   [provider]  loratadine (CLARITIN) 10 MG tablet Take 10 mg by mouth daily as needed for allergies.     [provider]  metoprolol tartrate (LOPRESSOR) 25 MG tablet Take 1 tablet (25 mg total) by mouth 2 (two) times daily. 12/14/22   Dunn, Tacey Ruiz, PA-C  rosuvastatin (CRESTOR) 10 MG tablet Take 10 mg by mouth 2 (two) times a week.    [provider]  traZODone (DESYREL) 100 MG tablet Take 1 tablet (100 mg total) by mouth at bedtime as needed for sleep. 11/14/17   Shon Hale, MD      Allergies    Codeine phosphate, Lipitor [atorvastatin calcium], and Sulfonamide derivatives    Review of Systems   Review of Systems  Constitutional:  Positive for activity change and appetite change. Negative for fever.  HENT:  Negative for congestion and rhinorrhea.   Respiratory:  Negative for cough, chest tightness and shortness of breath.   Gastrointestinal:  Positive for abdominal pain, diarrhea, nausea and vomiting.  Genitourinary:  Negative for dysuria and hematuria.  Musculoskeletal:  Negative for arthralgias and myalgias.  Skin:  Negative for rash.  Neurological:  Negative for dizziness, weakness and headaches.   all other systems are negative except as noted in the HPI and PMH.    Physical Exam Updated Vital Signs BP (!) 160/70 (BP Location: Right Arm)   Pulse 83   Temp (!) 97.4 F (36.3 C) (Oral)   SpO2 93%  Physical Exam Vitals and nursing note reviewed.  Constitutional:  General: She is not in acute distress.    Appearance: She is well-developed.  HENT:     Head: Normocephalic and atraumatic.     Mouth/Throat:     Pharynx: No oropharyngeal exudate.  Eyes:     Conjunctiva/sclera: Conjunctivae normal.     Pupils: Pupils are equal, round, and reactive to light.  Neck:     Comments: No meningismus. Cardiovascular:     Rate and Rhythm: Normal rate and regular rhythm.     Heart sounds: Normal heart sounds. No murmur heard. Pulmonary:     Effort: Pulmonary effort is normal. No respiratory distress.     Breath sounds: Normal breath sounds.  Abdominal:      Palpations: Abdomen is soft.     Tenderness: There is abdominal tenderness. There is guarding. There is no rebound.     Comments: Right upper and lower quadrant tenderness with voluntary guarding  Musculoskeletal:        General: No tenderness. Normal range of motion.     Cervical back: Normal range of motion and neck supple.  Skin:    General: Skin is warm.  Neurological:     Mental Status: She is alert and oriented to person, place, and time.     Cranial Nerves: No cranial nerve deficit.     Motor: No abnormal muscle tone.     Coordination: Coordination normal.     Comments:  5/5 strength throughout. CN 2-12 intact.Equal grip strength.   Psychiatric:        Behavior: Behavior normal.     ED Results / Procedures / Treatments   Labs (all labs ordered are listed, but only abnormal results are displayed) Labs Reviewed  CBC WITH DIFFERENTIAL/PLATELET - Abnormal; Notable for the following components:      Result Value   WBC 19.0 (*)    RBC 3.73 (*)    HCT 35.1 (*)    Neutro Abs 17.6 (*)    Lymphs Abs 0.2 (*)    Abs Immature Granulocytes 0.12 (*)    All other components within normal limits  COMPREHENSIVE METABOLIC PANEL WITH GFR - Abnormal; Notable for the following components:   Sodium 131 (*)    Potassium 3.2 (*)    Chloride 93 (*)    Glucose, Bld 132 (*)    Creatinine, Ser 1.28 (*)    AST 1,953 (*)    ALT 625 (*)    Alkaline Phosphatase 237 (*)    Total Bilirubin 2.1 (*)    GFR, Estimated 41 (*)    All other components within normal limits  LIPASE, BLOOD - Abnormal; Notable for the following components:   Lipase 1,066 (*)    All other components within normal limits  URINALYSIS, ROUTINE W REFLEX MICROSCOPIC - Abnormal; Notable for the following components:   Color, Urine AMBER (*)    APPearance CLOUDY (*)    All other components within normal limits  LACTIC ACID, PLASMA - Abnormal; Notable for the following components:   Lactic Acid, Venous 2.2 (*)    All other  components within normal limits  CULTURE, BLOOD (ROUTINE X 2)  CULTURE, BLOOD (ROUTINE X 2)  LACTIC ACID, PLASMA    EKG None  Radiology CT ABDOMEN PELVIS W CONTRAST Result Date: 05/10/2023 CLINICAL DATA:  89 year old female with abdominal pain. EXAM: CT ABDOMEN AND PELVIS WITH CONTRAST TECHNIQUE: Multidetector CT imaging of the abdomen and pelvis was performed using the standard protocol following bolus administration of intravenous contrast. RADIATION DOSE REDUCTION: This  exam was performed according to the departmental dose-optimization program which includes automated exposure control, adjustment of the mA and/or kV according to patient size and/or use of iterative reconstruction technique. CONTRAST:  75mL OMNIPAQUE IOHEXOL 300 MG/ML  SOLN COMPARISON:  Right upper quadrant ultrasound earlier today. FINDINGS: Lower chest: Borderline to mild cardiomegaly. No pericardial or pleural effusion. Plate-like, streaky bilateral lung base atelectasis. Hepatobiliary: Mild hepatic periportal edema. Liver enhancement otherwise within normal limits. Inflammation, edema at the porta hepatis. CBD mildly dilated up to 9 mm diameter (coronal image 46). No CBD filling defect. Gallbladder remains within normal limits by CT. Pancreas: Confluent inflammation and edema throughout the lesser sac with indistinct appearance of the pancreas, especially the head and uncinate. Streak artifact from lumbar spine hardware and spinal stimulator device also limit some detail of the pancreas. No main ductal dilatation is identified. No obvious mass or necrosis. Associated mildly fluid distended proximal stomach and proximal duodenum. The distal stomach and distal duodenum are both decompressed. Secondary inflammation on both structures. Small volume of free fluid emanating from the lesser sac, no drainable or organized fluid collection is identified. Spleen: Trace perisplenic free fluid. Splenic enhancement remains normal.  Adrenals/Urinary Tract: Normal adrenal glands. Nonobstructed kidneys with symmetric renal enhancement and contrast excretion 2 diminutive ureters. Stomach/Bowel: Nondilated large and small bowel throughout the abdomen and pelvis. Intermittent large bowel diverticulosis. Appendix diminutive or absent. No pneumoperitoneum. Vascular/Lymphatic: Aortoiliac calcified atherosclerosis. Major arterial structures remain patent. Normal caliber abdominal aorta. Portal venous system including the splenic vein is patent. No lymphadenopathy identified. Reproductive: Within normal limits. Other: No pelvis free fluid. Musculoskeletal: Advanced spine degeneration superimposed on 3 level lumbar decompression and fusion. And thoracic spinal stimulator partially visible, right flank generator device. No acute osseous abnormality identified. IMPRESSION: 1. Acute Pancreatitis. Some pancreatic detail limited from artifact due to lumbar spine hardware and spinal stimulator device. No obvious pancreatic mass or necrosis. 2. Confluent inflammation, edema emanating from the lesser sac. Secondary inflammation of the stomach and duodenum. Small volume of free fluid in the abdomen but no organized or drainable fluid collection identified. 3. Inflammation at the porta hepatis with mildly dilated CBD (9 mm), and mild periportal edema. 4. Mild lung base atelectasis. Aortic Atherosclerosis (ICD10-I70.0). Electronically Signed   By: Odessa Fleming M.D.   On: 05/10/2023 04:19   US Abdomen Limited RUQ (LIVER/GB) Result Date: 05/10/2023 CLINICAL DATA:  Acute right upper quadrant pain. EXAM: ULTRASOUND ABDOMEN LIMITED RIGHT UPPER QUADRANT COMPARISON:  CTA chest including upper abdomen 12/02/2021. FINDINGS: Gallbladder: No gallstones or wall thickening visualized. No sonographic Murphy sign noted by sonographer. There is a small amount of hypoechoic layering sludge within the lumen. Common bile duct: Diameter: 6.3 mm.  No intrahepatic bile duct dilatation.  Liver: No focal lesion identified. Within normal limits in parenchymal echogenicity. Portal vein is patent on color Doppler imaging with normal direction of blood flow towards the liver. Other: None. IMPRESSION: 1. Small amount of gallbladder sludge. No gallstones or wall thickening. 2. Common bile duct 6.3 mm. Correlate with LFTs. No intrahepatic bile duct dilatation. Electronically Signed   By: Almira Bar M.D.   On: 05/10/2023 03:51    Procedures .Critical Care  Performed by: Glynn Octave, MD Authorized by: Glynn Octave, MD   Critical care provider statement:    Critical care time (minutes):  35   Critical care time was exclusive of:  Separately billable procedures and treating other patients   Critical care was necessary to treat or prevent imminent or  life-threatening deterioration of the following conditions: gallstone pancreatitis.   Critical care was time spent personally by me on the following activities:  Development of treatment plan with patient or surrogate, discussions with consultants, evaluation of patient's response to treatment, examination of patient, ordering and review of laboratory studies, ordering and review of radiographic studies, ordering and performing treatments and interventions, pulse oximetry, re-evaluation of patient's condition, review of old charts, blood draw for specimens and obtaining history from patient or surrogate   I assumed direction of critical care for this patient from another provider in my specialty: no     Care discussed with: admitting provider       Medications Ordered in ED Medications  fentaNYL (SUBLIMAZE) injection 50 mcg (has no administration in time range)  ondansetron (ZOFRAN) injection 4 mg (has no administration in time range)  lactated ringers bolus 1,000 mL (has no administration in time range)    ED Course/ Medical Decision Making/ A&P                                 Medical Decision Making Amount and/or  Complexity of Data Reviewed Labs: ordered. Decision-making details documented in ED Course. Radiology: ordered and independent interpretation performed. Decision-making details documented in ED Course. ECG/medicine tests: ordered and independent interpretation performed. Decision-making details documented in ED Course.  Risk Prescription drug management. Decision regarding hospitalization.   \Right-sided abdominal pain since 4 PM associate with nausea and vomiting.  Vitals are stable.  Abdomen is very tender to the right side with voluntary guarding.  Patient given IV fluids, pain and nausea medication.  Concern for possible gallbladder pathology given her right-sided abdominal pain.  Labs show transaminitis as well as elevated lipase over thousand.  White count of 19.  AST 1953, ALT 625, total bilirubin 2.1.  Concern for gallstone pancreatitis.  Patient given broad-spectrum IV antibiotics.  CT imaging will be obtained.  Ultrasound shows cholelithiasis without cholecystitis.  Does have dilated CBD duct  CT scan shows evidence of pancreatitis without drainable fluid collection.  Continue empiric antibiotics, IV fluids and pain medications.  Will need admission for gallstone pancreatitis.  Will likely need surgical consultation for cholecystectomy.  Admission discussed with Dr. Toniann Fail. Message sent to gastroenterology., Dr. Dulce Sellar.       Final Clinical Impression(s) / ED Diagnoses Final diagnoses:  Gallstone pancreatitis    Rx / DC Orders ED Discharge Orders     None         Yeny Schmoll, Jeannett Senior, MD 05/10/23 [phone]

## 2023-05-10 NOTE — TOC Initial Note (Signed)
 Transition of Care [place] [facility]) - Initial/Assessment Note    Patient Details  Name: Cheryl [name] MRN: [id number] Date of Birth: [birth year]  [facility] CM/SW Contact:    Lanier Clam, RN Phone Number: 05/10/2023, 12:54 PM  Clinical Narrative:  d/c plan home.                 Expected Discharge Plan: Home/Self Care Barriers to Discharge: Continued Medical Work up   Patient Goals and CMS Choice Patient states their goals for this hospitalization and ongoing recovery are:: Home CMS Medicare.gov Compare Post Acute Care list provided to:: Patient Choice offered to / list presented to : Patient  ownership interest in [facility].provided to:: Patient    Expected Discharge Plan and Services                                              Prior Living Arrangements/Services                       Activities of Daily Living   ADL Screening (condition at time of admission) Independently performs ADLs?: Yes (appropriate for developmental age) Is the patient deaf or have difficulty hearing?: Yes (has aides but didn't bring to hospital) Does the patient have difficulty seeing, even when wearing glasses/contacts?: Yes Does the patient have difficulty concentrating, remembering, or making decisions?: No  Permission Sought/Granted                  Emotional Assessment              Admission diagnosis:  Acute pancreatitis [K85.90] Gallstone pancreatitis [K85.10] Patient Active Problem List   Diagnosis Date Noted   Acute pancreatitis 05/10/2023   History of CVA (cerebrovascular accident) 05/10/2023   Right knee DJD 11/25/2019   Chondrocalcinosis due to dicalcium phosphate crystals, of the knee, right 11/25/2019   Acute CVA (cerebrovascular accident) (HCC) 11/10/2017   Hypertensive emergency 11/10/2017   S/P insertion of spinal cord stimulator 10/11/2017   Chronic pain syndrome 10/11/2017   Post laminectomy syndrome 10/11/2017    Radiculopathy, lumbar region 10/11/2017   Headache 02/23/2017   Nausea & vomiting 02/23/2017   Hyponatremia 02/23/2017   Hypertensive urgency 02/23/2017   Lumbar herniated disc 02/15/2015   CKD (chronic kidney disease) stage 3, GFR 30-59 ml/min (HCC) 06/22/2014   Spondylolisthesis of lumbar region 08/07/2013   Hyperlipidemia 06/13/2008   PAF (paroxysmal atrial fibrillation) (HCC) 06/13/2008   Essential hypertension 11/05/2006   Coronary atherosclerosis 11/05/2006   PCP:  Laurann Montana, MD Pharmacy:   [facility] [phone] [place] Otto,  - [address] AT [facility] [address] [place] [address] Phone: [phone] Fax: [phone]     Social Drivers of Health (SDOH) Social History: SDOH Screenings   Food Insecurity: No Food Insecurity (05/10/2023)  Housing: Low Risk  (05/10/2023)  Transportation Needs: No Transportation Needs (05/10/2023)  Utilities: Not At Risk (05/10/2023)  Social Connections: Moderately Isolated (05/10/2023)  Tobacco Use: Low Risk  (05/10/2023)   SDOH Interventions:     Readmission Risk Interventions     No data to display

## 2023-05-10 NOTE — Progress Notes (Signed)
 Patient admitted after midnight, please see H&P.  A/P: Acute pancreatitis with markedly elevated LFTs  - suspect gallstone pancreatitis.  Mildly dilated common bile duct.  No definite gallstones seen on CAT scan or ultrasound.   - Dr. Dulce Sellar gastroenterology has been consulted.   -unable to have MRI due to spinal cord stimulator- note from 10/19: MRI talked with [facility] regarding spinal stimulator and was advised that the wires and generator is not safe to undergo a MRI.   Paroxysmal atrial fibrillation  -takes Tambocor and metoprolol.  - Hold Eliquis:  last dose was 4/2 AM  Prior history of CVA  -presently on heparin infusion in place of eliquis -hold statin  Hypertension  -hold amlodipine.   -Continue metoprolol  Chronic kidney disease stage III  -IVF and daily labs.  Marlin Canary DO

## 2023-05-10 NOTE — Progress Notes (Signed)
 PHARMACY - ANTICOAGULATION CONSULT NOTE  Pharmacy Consult for IV Heparin (PTA apixaban) Indication: atrial fibrillation  Allergies  Allergen Reactions   Codeine Phosphate Other (See Comments)    Headaches   Cortisone    Lipitor [Atorvastatin Calcium] Other (See Comments)    Muscle pain and tiredness   Sulfonamide Derivatives     Patient Measurements:   Wt 66kg from Nov 2024  Vital Signs: Temp: 97.6 F (36.4 C) (04/03 1418) Temp Source: Oral (04/03 1418) BP: 141/60 (04/03 1418) Pulse Rate: 62 (04/03 1418)  Labs: Recent Labs    05/10/23 0115 05/10/23 0600 05/10/23 0713 05/10/23 1627  HGB 12.2  --  12.6  --   HCT 35.1*  --  36.6  --   PLT 275  --  273  --   APTT  --   --   --  123*  HEPARINUNFRC  --   --   --  >1.10*  CREATININE 1.28*  --  1.20*  --   TROPONINIHS  --  20* 23*  --     CrCl cannot be calculated (Unknown ideal weight.).   Medical History: Past Medical History:  Diagnosis Date   Arthritis    CAD (coronary artery disease)    Mild per remote cath in 2004; normal nuclear in 2012   Carotid artery disease (HCC)    Chronic kidney disease    stage3 kidney disease    Complication of anesthesia    slow to wake up   Diastolic dysfunction    Grade I, per echo in 2012   GERD (gastroesophageal reflux disease)    prevacid   H/O hiatal hernia    Headache(784.0)    sinus   High risk medication use    Flecainide therapy   History of heartburn    Hyperlipidemia    Hypertension    LVH (left ventricular hypertrophy)    Per echo in July 2012   Normal nuclear stress test 08/07/2010   Obesity    PAF (paroxysmal atrial fibrillation) (HCC)    Pneumonia    20 yrs ago   Syncope 08/07/2010    Medications:  Scheduled:   flecainide  50 mg Oral Daily   metoprolol tartrate  25 mg Oral BID   Infusions:   heparin 800 Units/hr (05/10/23 0806)   lactated ringers 125 mL/hr at 05/10/23 1443    Assessment: 89 yo admitted with acute pancreatitis on apixaban  prior to admission for Afib. Per MD, to change to IV heparin. Last apixaban dose was 4/2 morning.  aPTT 123 - supratherapeutic with heparin infusion at 800 units/hr CBC ok No complications of therapy noted  Goal of Therapy:  Heparin level 0.3-0.7 units/ml aPTT 66-102 seconds Monitor platelets by anticoagulation protocol: Yes   Plan:  -Decrease heparin infusion to 650 units/hr -Check aPTT in 8 hours -Daily CBC -Continue to follow for transition back to apixaban as able   Pricilla Riffle, PharmD, BCPS Clinical Pharmacist 05/10/2023 5:48 PM

## 2023-05-10 NOTE — H&P (Signed)
 History and Physical    Cheryl [name]:[id number] DOB: [birth year] DOA: 05/09/2023  Patient coming from: Home.  Chief Complaint: Abdominal pain.  HPI: Cheryl [name] is a 89 y.o. female with history of paroxysmal atrial fibrillation, hypertension, chronic kidney disease stage III, hyperlipidemia presents to the ER with complaints of abdominal pain.  Patient's pain started last evening around 4 PM.  Had multiple episodes of vomiting.  Pain is mostly in the epigastric area nonradiating.  Patient initially tried some Gas-X with no relief decided to come to the ER.  Patient states she never drinks alcohol.  Denies any chest pain or shortness of breath.  ED Course: In the ER patient's labs show elevated LFTs with AST of 1900 and ALT of 625 total bilirubin 2.1 lipase 1066.  Lactic acid was 1.5 worsened to 2.2.  Was given fluids.  WBC count is 19 hematocrit 35.1 BUN 22 creatinine 1.2.  CT abdomen pelvis shows features concerning for acute pancreatitis but definite gallstones not seen.  CBD dilated.  Patient admitted for acute pancreatitis.  Review of Systems: As per HPI, rest all negative.   Past Medical History:  Diagnosis Date   Arthritis    CAD (coronary artery disease)    Mild per remote cath in 2004; normal nuclear in 2012   Carotid artery disease (HCC)    Chronic kidney disease    stage3 kidney disease    Complication of anesthesia    slow to wake up   Diastolic dysfunction    Grade I, per echo in 2012   GERD (gastroesophageal reflux disease)    prevacid   H/O hiatal hernia    Headache(784.0)    sinus   High risk medication use    Flecainide therapy   History of heartburn    Hyperlipidemia    Hypertension    LVH (left ventricular hypertrophy)    Per echo in July 2012   Normal nuclear stress test 08/07/2010   Obesity    PAF (paroxysmal atrial fibrillation) (HCC)    Pneumonia    20 yrs ago   Syncope 08/07/2010    Past Surgical History:  Procedure Laterality Date    APPENDECTOMY  1952   BACK SURGERY  1980, 2010   x2   CARDIAC CATHETERIZATION  07/28/2002   EF GREATER THAN 55%; MILD CAD   COLONOSCOPY     DILATION AND CURETTAGE OF UTERUS     LUMBAR LAMINECTOMY/DECOMPRESSION MICRODISCECTOMY Right 02/15/2015   Procedure: Right Thoracic twelve-Lumbar one  Microdiskectomy;  Surgeon: Tressie Stalker, MD;  Location: [facility];  Service: Neurosurgery;  Laterality: Right;  Thoracic/Lumbar   TONSILLECTOMY     age 31 yrs   TUBAL LIGATION       reports that she has never smoked. She has never used smokeless tobacco. She reports that she does not drink alcohol and does not use drugs.  Allergies  Allergen Reactions   Codeine Phosphate     headache   Lipitor [Atorvastatin Calcium]     Muscle pain and tiredness   Sulfonamide Derivatives     REACTION: unspecified    Family History  Problem Relation Age of Onset   Lung cancer Father    Stroke Paternal Grandmother     Prior to Admission medications   Medication Sig Start Date End Date Taking? Authorizing Provider  alendronate (FOSAMAX) 70 MG tablet Take 70 mg by mouth once a week. 10/04/21   [provider]  amLODipine (NORVASC) 5 MG tablet Take 5  mg by mouth daily. 11/28/21   [provider]  Biotin 10 MG CAPS Take 10 mg by mouth daily.    [provider]  cyclobenzaprine (FLEXERIL) 5 MG tablet Take 1 tablet (5 mg total) by mouth 3 (three) times daily as needed for muscle spasms. 11/14/17   Shon Hale, MD  ELIQUIS 5 MG TABS tablet TAKE 1 TABLET BY MOUTH TWICE DAILY 09/20/22   Jake Bathe, MD  famotidine (PEPCID) 20 MG tablet Take 20 mg by mouth at bedtime as needed. 09/28/21   [provider]  flecainide (TAMBOCOR) 50 MG tablet Take 1 tablet (50 mg total) by mouth daily. 12/14/22   Dunn, Tacey Ruiz, PA-C  HYDROcodone-acetaminophen (NORCO/VICODIN) 5-325 MG tablet Take 1 tablet by mouth 3 (three) times daily as needed. 10/06/21   [provider]  loratadine  (CLARITIN) 10 MG tablet Take 10 mg by mouth daily as needed for allergies.    [provider]  metoprolol tartrate (LOPRESSOR) 25 MG tablet Take 1 tablet (25 mg total) by mouth 2 (two) times daily. 12/14/22   Dunn, Tacey Ruiz, PA-C  rosuvastatin (CRESTOR) 10 MG tablet Take 10 mg by mouth 2 (two) times a week.    [provider]  traZODone (DESYREL) 100 MG tablet Take 1 tablet (100 mg total) by mouth at bedtime as needed for sleep. 11/14/17   Shon Hale, MD    Physical Exam: Constitutional: Moderately built and nourished. Vitals:   05/10/23 0200 05/10/23 0230 05/10/23 0410 05/10/23 0530  BP: (!) 148/66 (!) 108/90  (!) 146/79  Pulse: 79 77  76  Resp:    18  Temp:   97.9 F (36.6 C)   TempSrc:   Oral   SpO2: 99% 99%  100%   Eyes: Anicteric no pallor. ENMT: No discharge from the ears eyes nose or mouth: Neck: No mass felt.  No neck rigidity. Respiratory: No rhonchi or crepitations. Cardiovascular: S1-S2 heard. Abdomen: Epigastric tenderness no guarding or rigidity. Musculoskeletal: No edema. Skin: No rash. Neurologic: Alert awake oriented to time place and person.  Moves all extremities. Psychiatric: Appears normal.  Normal affect.   Labs on Admission: I have personally reviewed following labs and imaging studies  CBC: Recent Labs  Lab 05/10/23 0115  WBC 19.0*  NEUTROABS 17.6*  HGB 12.2  HCT 35.1*  MCV 94.1  PLT 275   Basic Metabolic Panel: Recent Labs  Lab 05/10/23 0115  NA 131*  K 3.2*  CL 93*  CO2 27  GLUCOSE 132*  BUN 22  CREATININE 1.28*  CALCIUM 10.2   GFR: CrCl cannot be calculated (Unknown ideal weight.). Liver Function Tests: Recent Labs  Lab 05/10/23 0115  AST 1,953*  ALT 625*  ALKPHOS 237*  BILITOT 2.1*  PROT 6.7  ALBUMIN 3.7   Recent Labs  Lab 05/10/23 0115  LIPASE 1,066*   No results for input(s): "AMMONIA" in the last 168 hours. Coagulation Profile: No results for input(s): "INR", "PROTIME" in the last 168  hours. Cardiac Enzymes: No results for input(s): "CKTOTAL", "CKMB", "CKMBINDEX", "TROPONINI" in the last 168 hours. BNP (last 3 results) No results for input(s): "PROBNP" in the last 8760 hours. HbA1C: No results for input(s): "HGBA1C" in the last 72 hours. CBG: No results for input(s): "GLUCAP" in the last 168 hours. Lipid Profile: No results for input(s): "CHOL", "HDL", "LDLCALC", "TRIG", "CHOLHDL", "LDLDIRECT" in the last 72 hours. Thyroid Function Tests: No results for input(s): "TSH", "T4TOTAL", "FREET4", "T3FREE", "THYROIDAB" in the last 72 hours.  Anemia Panel: No results for input(s): "VITAMINB12", "FOLATE", "FERRITIN", "TIBC", "IRON", "RETICCTPCT" in the last 72 hours. Urine analysis:    Component Value Date/Time   COLORURINE AMBER (A) 05/10/2023 0331   APPEARANCEUR CLOUDY (A) 05/10/2023 0331   LABSPEC 1.010 05/10/2023 0331   PHURINE 7.0 05/10/2023 0331   GLUCOSEU NEGATIVE 05/10/2023 0331   HGBUR NEGATIVE 05/10/2023 0331   BILIRUBINUR NEGATIVE 05/10/2023 0331   KETONESUR NEGATIVE 05/10/2023 0331   PROTEINUR NEGATIVE 05/10/2023 0331   UROBILINOGEN 0.2 08/05/2008 1450   NITRITE NEGATIVE 05/10/2023 0331   LEUKOCYTESUR NEGATIVE 05/10/2023 0331   Sepsis Labs: @LABRCNTIP (procalcitonin:4,lacticidven:4) )No results found for this or any previous visit (from the past 240 hours).   Radiological Exams on Admission: CT ABDOMEN PELVIS W CONTRAST Result Date: 05/10/2023 CLINICAL DATA:  89 year old female with abdominal pain. EXAM: CT ABDOMEN AND PELVIS WITH CONTRAST TECHNIQUE: Multidetector CT imaging of the abdomen and pelvis was performed using the standard protocol following bolus administration of intravenous contrast. RADIATION DOSE REDUCTION: This exam was performed according to the departmental dose-optimization program which includes automated exposure control, adjustment of the mA and/or kV according to patient size and/or use of iterative reconstruction technique. CONTRAST:   75mL OMNIPAQUE IOHEXOL 300 MG/ML  SOLN COMPARISON:  Right upper quadrant ultrasound earlier today. FINDINGS: Lower chest: Borderline to mild cardiomegaly. No pericardial or pleural effusion. Plate-like, streaky bilateral lung base atelectasis. Hepatobiliary: Mild hepatic periportal edema. Liver enhancement otherwise within normal limits. Inflammation, edema at the porta hepatis. CBD mildly dilated up to 9 mm diameter (coronal image 46). No CBD filling defect. Gallbladder remains within normal limits by CT. Pancreas: Confluent inflammation and edema throughout the lesser sac with indistinct appearance of the pancreas, especially the head and uncinate. Streak artifact from lumbar spine hardware and spinal stimulator device also limit some detail of the pancreas. No main ductal dilatation is identified. No obvious mass or necrosis. Associated mildly fluid distended proximal stomach and proximal duodenum. The distal stomach and distal duodenum are both decompressed. Secondary inflammation on both structures. Small volume of free fluid emanating from the lesser sac, no drainable or organized fluid collection is identified. Spleen: Trace perisplenic free fluid. Splenic enhancement remains normal. Adrenals/Urinary Tract: Normal adrenal glands. Nonobstructed kidneys with symmetric renal enhancement and contrast excretion 2 diminutive ureters. Stomach/Bowel: Nondilated large and small bowel throughout the abdomen and pelvis. Intermittent large bowel diverticulosis. Appendix diminutive or absent. No pneumoperitoneum. Vascular/Lymphatic: Aortoiliac calcified atherosclerosis. Major arterial structures remain patent. Normal caliber abdominal aorta. Portal venous system including the splenic vein is patent. No lymphadenopathy identified. Reproductive: Within normal limits. Other: No pelvis free fluid. Musculoskeletal: Advanced spine degeneration superimposed on 3 level lumbar decompression and fusion. And thoracic spinal  stimulator partially visible, right flank generator device. No acute osseous abnormality identified. IMPRESSION: 1. Acute Pancreatitis. Some pancreatic detail limited from artifact due to lumbar spine hardware and spinal stimulator device. No obvious pancreatic mass or necrosis. 2. Confluent inflammation, edema emanating from the lesser sac. Secondary inflammation of the stomach and duodenum. Small volume of free fluid in the abdomen but no organized or drainable fluid collection identified. 3. Inflammation at the porta hepatis with mildly dilated CBD (9 mm), and mild periportal edema. 4. Mild lung base atelectasis. Aortic Atherosclerosis (ICD10-I70.0). Electronically Signed   By: Odessa Fleming M.D.   On: 05/10/2023 04:19   US Abdomen Limited RUQ (LIVER/GB) Result Date: 05/10/2023 CLINICAL DATA:  Acute right upper quadrant pain. EXAM: ULTRASOUND ABDOMEN LIMITED RIGHT UPPER QUADRANT COMPARISON:  CTA chest including  upper abdomen 12/02/2021. FINDINGS: Gallbladder: No gallstones or wall thickening visualized. No sonographic Murphy sign noted by sonographer. There is a small amount of hypoechoic layering sludge within the lumen. Common bile duct: Diameter: 6.3 mm.  No intrahepatic bile duct dilatation. Liver: No focal lesion identified. Within normal limits in parenchymal echogenicity. Portal vein is patent on color Doppler imaging with normal direction of blood flow towards the liver. Other: None. IMPRESSION: 1. Small amount of gallbladder sludge. No gallstones or wall thickening. 2. Common bile duct 6.3 mm. Correlate with LFTs. No intrahepatic bile duct dilatation. Electronically Signed   By: Almira Bar M.D.   On: 05/10/2023 03:51    EKG: Independently reviewed.  Normal sinus rhythm.  Assessment/Plan Principal Problem:   Acute pancreatitis Active Problems:   Essential hypertension   PAF (paroxysmal atrial fibrillation) (HCC)   CKD (chronic kidney disease) stage 3, GFR 30-59 ml/min (HCC)   S/P insertion of  spinal cord stimulator   History of CVA (cerebrovascular accident)    Acute pancreatitis with markedly elevated LFTs -    suspect gallstone pancreatitis.  Mildly dilated common bile duct.  No definite gallstones seen on CAT scan or ultrasound.  Not sure if MRCP can be done given the spinal pain stimulator.  Dr. Dulce Sellar gastroenterology has been consulted.  Will keep patient on IV fluids n.p.o. except medications and pain relief medications.  Check triglyceride levels. Paroxysmal atrial fibrillation takes Tambocor and metoprolol.  Hold Eliquis last dose was yesterday morning.  Will keep patient on heparin bridging. Prior history of CVA on statins and Eliquis presently on heparin infusion. Hypertension will hold amlodipine.  Continue metoprolol.  Gentle hydration. Chronic kidney disease stage III creatinine around baseline.  Since patient has acute pancreatitis will need close monitoring further hydration and more than 2 midnight stay.   DVT prophylaxis: Heparin infusion. Code Status: Full code. Family Communication: Discussed with patient. Disposition Plan: Progressive care. Consults called: Solicitor. Admission status: Inpatient.

## 2023-05-10 NOTE — Progress Notes (Signed)
 PHARMACY - PHYSICIAN COMMUNICATION CRITICAL VALUE ALERT - BLOOD CULTURE IDENTIFICATION (BCID)  Cheryl [name] is an 89 y.o. female who presented to [facility] on 05/09/2023 with a chief complaint of abdominal pain  Assessment:   Bcx 1/4 E.coli no resistance No abx currently Acute pancreatitis  Name of physician (or Provider) Contacted: Anthoney Harada, NP  Current antibiotics: none  Changes to prescribed antibiotics recommended: start ceftriaxone 2 g IV q24h  Results for orders placed or performed during the hospital encounter of 05/09/23  Blood Culture ID Panel (Reflexed) (Collected: 05/10/2023  3:40 AM)  Result Value Ref Range   Enterococcus faecalis NOT DETECTED NOT DETECTED   Enterococcus Faecium NOT DETECTED NOT DETECTED   Listeria monocytogenes NOT DETECTED NOT DETECTED   Staphylococcus species NOT DETECTED NOT DETECTED   Staphylococcus aureus (BCID) NOT DETECTED NOT DETECTED   Staphylococcus epidermidis NOT DETECTED NOT DETECTED   Staphylococcus lugdunensis NOT DETECTED NOT DETECTED   Streptococcus species NOT DETECTED NOT DETECTED   Streptococcus agalactiae NOT DETECTED NOT DETECTED   Streptococcus pneumoniae NOT DETECTED NOT DETECTED   Streptococcus pyogenes NOT DETECTED NOT DETECTED   A.calcoaceticus-baumannii NOT DETECTED NOT DETECTED   Bacteroides fragilis NOT DETECTED NOT DETECTED   Enterobacterales DETECTED (A) NOT DETECTED   Enterobacter cloacae complex NOT DETECTED NOT DETECTED   Escherichia coli DETECTED (A) NOT DETECTED   Klebsiella aerogenes NOT DETECTED NOT DETECTED   Klebsiella oxytoca NOT DETECTED NOT DETECTED   Klebsiella pneumoniae NOT DETECTED NOT DETECTED   Proteus species NOT DETECTED NOT DETECTED   Salmonella species NOT DETECTED NOT DETECTED   Serratia marcescens NOT DETECTED NOT DETECTED   Haemophilus influenzae NOT DETECTED NOT DETECTED   Neisseria meningitidis NOT DETECTED NOT DETECTED   Pseudomonas aeruginosa NOT DETECTED NOT DETECTED    Stenotrophomonas maltophilia NOT DETECTED NOT DETECTED   Candida albicans NOT DETECTED NOT DETECTED   Candida auris NOT DETECTED NOT DETECTED   Candida glabrata NOT DETECTED NOT DETECTED   Candida krusei NOT DETECTED NOT DETECTED   Candida parapsilosis NOT DETECTED NOT DETECTED   Candida tropicalis NOT DETECTED NOT DETECTED   Cryptococcus neoformans/gattii NOT DETECTED NOT DETECTED   CTX-M ESBL NOT DETECTED NOT DETECTED   Carbapenem resistance IMP NOT DETECTED NOT DETECTED   Carbapenem resistance KPC NOT DETECTED NOT DETECTED   Carbapenem resistance NDM NOT DETECTED NOT DETECTED   Carbapenem resist OXA 48 LIKE NOT DETECTED NOT DETECTED   Carbapenem resistance VIM NOT DETECTED NOT DETECTED    Pricilla Riffle, PharmD, BCPS Clinical Pharmacist 05/10/2023 8:36 PM

## 2023-05-10 NOTE — ED Triage Notes (Signed)
 Pt arrived via EMS From home for emesis and lower ab pain,  150/70 [phone] cbg

## 2023-05-11 ENCOUNTER — Encounter (HOSPITAL_COMMUNITY): Payer: Self-pay | Admitting: Internal Medicine

## 2023-05-11 DIAGNOSIS — K851 Biliary acute pancreatitis without necrosis or infection: Secondary | ICD-10-CM | POA: Diagnosis not present

## 2023-05-11 LAB — CBC
HCT: 34.9 % — ABNORMAL LOW (ref 36.0–46.0)
Hemoglobin: 12 g/dL (ref 12.0–15.0)
MCH: 33 pg (ref 26.0–34.0)
MCHC: 34.4 g/dL (ref 30.0–36.0)
MCV: 95.9 fL (ref 80.0–100.0)
Platelets: 260 10*3/uL (ref 150–400)
RBC: 3.64 MIL/uL — ABNORMAL LOW (ref 3.87–5.11)
RDW: 13.1 % (ref 11.5–15.5)
WBC: 20.6 10*3/uL — ABNORMAL HIGH (ref 4.0–10.5)
nRBC: 0 % (ref 0.0–0.2)

## 2023-05-11 LAB — COMPREHENSIVE METABOLIC PANEL WITH GFR
ALT: 310 U/L — ABNORMAL HIGH (ref 0–44)
AST: 407 U/L — ABNORMAL HIGH (ref 15–41)
Albumin: 3.3 g/dL — ABNORMAL LOW (ref 3.5–5.0)
Alkaline Phosphatase: 151 U/L — ABNORMAL HIGH (ref 38–126)
Anion gap: 13 (ref 5–15)
BUN: 16 mg/dL (ref 8–23)
CO2: 21 mmol/L — ABNORMAL LOW (ref 22–32)
Calcium: 9 mg/dL (ref 8.9–10.3)
Chloride: 95 mmol/L — ABNORMAL LOW (ref 98–111)
Creatinine, Ser: 0.85 mg/dL (ref 0.44–1.00)
GFR, Estimated: >60 mL/min (ref >60)
Glucose, Bld: 103 mg/dL — ABNORMAL HIGH (ref 70–99)
Potassium: 3.4 mmol/L — ABNORMAL LOW (ref 3.5–5.1)
Sodium: 129 mmol/L — ABNORMAL LOW (ref 135–145)
Total Bilirubin: 1 mg/dL (ref 0.0–1.2)
Total Protein: 6.4 g/dL — ABNORMAL LOW (ref 6.5–8.1)

## 2023-05-11 LAB — APTT
aPTT: 97 s — ABNORMAL HIGH (ref 24–36)
aPTT: 81 s — ABNORMAL HIGH (ref 24–36)

## 2023-05-11 LAB — GLUCOSE, CAPILLARY
Glucose-Capillary: 104 mg/dL — ABNORMAL HIGH (ref 70–99)
Glucose-Capillary: 105 mg/dL — ABNORMAL HIGH (ref 70–99)
Glucose-Capillary: 86 mg/dL (ref 70–99)
Glucose-Capillary: 99 mg/dL (ref 70–99)

## 2023-05-11 LAB — HEPARIN LEVEL (UNFRACTIONATED): Heparin Unfractionated: >1.1 [IU]/mL — ABNORMAL HIGH (ref 0.30–0.70)

## 2023-05-11 LAB — LIPASE, BLOOD: Lipase: 729 U/L — ABNORMAL HIGH (ref 11–51)

## 2023-05-11 MED ORDER — POTASSIUM CHLORIDE CRYS ER 20 MEQ PO TBCR
40.0000 meq | EXTENDED_RELEASE_TABLET | Freq: Once | ORAL | Status: AC
  Administered 2023-05-11: 40 meq via ORAL
  Filled 2023-05-11: qty 2

## 2023-05-11 MED ORDER — SODIUM CHLORIDE 0.9 % IV SOLN: INTRAVENOUS | Status: DC

## 2023-05-11 NOTE — Progress Notes (Signed)
 Mobility Specialist - Progress Note   05/11/23 1301  Mobility  Activity Ambulated with assistance to bathroom;Ambulated with assistance in hallway  Level of Assistance Contact guard assist, steadying assist  Assistive Device Front wheel walker  Distance Ambulated (ft) 70 ft  Range of Motion/Exercises Active  Activity Response Tolerated well  Mobility Referral Yes  Mobility visit 1 Mobility  Mobility Specialist Start Time (ACUTE ONLY) 1250  Mobility Specialist Stop Time (ACUTE ONLY) 1301  Mobility Specialist Time Calculation (min) (ACUTE ONLY) 11 min   Pt was found in bed requesting assistance to bathroom. Agreeable to ambulate afterwards. Grew fatigued with session. At EOS returned to bed with all needs met. Call bell in reach and daughter in room.  Billey Chang Mobility Specialist

## 2023-05-11 NOTE — Progress Notes (Signed)
 PROGRESS NOTE    [name]  SWF:[id number] DOB: 1934/09/22 DOA: 05/09/2023 PCP: Laurann Montana, MD    Brief Narrative:  Cheryl [name] is a 89 y.o. female with history of paroxysmal atrial fibrillation, hypertension, chronic kidney disease stage III, hyperlipidemia presents to the ER with complaints of abdominal pain.  Patient's pain started last evening around 4 PM.  Had multiple episodes of vomiting.  Pain is mostly in the epigastric area nonradiating.  Patient initially tried some Gas-X with no relief decided to come to the ER.  MRCP not able to be done due to implanted device.  GI and GS consulted for recommendations   Assessment and Plan: Acute pancreatitis with markedly elevated LFTs  - suspect gallstone pancreatitis.  Mildly dilated common bile duct.  No definite gallstones seen on CAT scan or ultrasound.   - Dr. Dulce Sellar gastroenterology has been consulted.   -unable to have MRI due to spinal cord stimulator- note from 10/19: MRI talked with [facility] regarding spinal stimulator and was advised that the wires and generator is not safe to undergo a MRI.  -GS consult for consideration of cholecystectomy + IOC   + blood culture/bacteremia- e coli IV abx added -   Paroxysmal atrial fibrillation  -takes Tambocor and metoprolol.  - Hold Eliquis:  last dose was 4/2 AM   Prior history of CVA  -presently on heparin infusion in place of eliquis -hold statin   Hypertension  -hold amlodipine.   -Continue metoprolol   Chronic kidney disease stage III  -IVF and daily labs.    DVT prophylaxis:     Code Status: Full Code Family Communication: called daughter   Disposition Plan:  Level of care: Progressive Status is: Inpatient     Consultants:  GS GI    Subjective: No SOB, no CP Would like to go home   Objective: Vitals:   05/10/23 1950 05/11/23 0559 05/11/23 0615 05/11/23 1213  BP: (!) 140/63  (!) 150/72 134/60  Pulse: 80  93 75  Resp: 20  20 16    Temp: 98.1 F (36.7 C)  98.6 F (37 C) 99 F (37.2 C)  TempSrc: Oral  Oral Oral  SpO2: 91%  93% 96%  Weight:  62.1 kg    Height:  5\' 3"  (1.6 m)      Intake/Output Summary (Last 24 hours) at 05/11/2023 1302 Last data filed at 05/11/2023 0500 Gross per 24 hour  Intake 2996.8 ml  Output --  Net 2996.8 ml   Filed Weights   05/11/23 0559  Weight: 62.1 kg    Examination:   General: Appearance:    Well developed, well nourished female in no acute distress     Lungs:     respirations unlabored  Heart:    Normal heart rate.    MS:   All extremities are intact.    Neurologic:   Awake, alert       Data Reviewed: I have personally reviewed following labs and imaging studies  CBC: Recent Labs  Lab 05/10/23 0115 05/10/23 0713 05/11/23 0514  WBC 19.0* 20.0* 20.6*  NEUTROABS 17.6* 18.3*  --   HGB 12.2 12.6 12.0  HCT 35.1* 36.6 34.9*  MCV 94.1 96.3 95.9  PLT 275 273 260   Basic Metabolic Panel: Recent Labs  Lab 05/10/23 0115 05/10/23 0713 05/11/23 0514  NA 131* 134* 129*  K 3.2* 3.5 3.4*  CL 93* 99 95*  CO2 27 25 21*  GLUCOSE 132* 127* 103*  BUN  22 19 16   CREATININE 1.28* 1.20* 0.85  CALCIUM 10.2 9.4 9.0   GFR: Estimated Creatinine Clearance: 39.3 mL/min (by C-G formula based on SCr of 0.85 mg/dL). Liver Function Tests: Recent Labs  Lab 05/10/23 0115 05/10/23 0713 05/11/23 0514  AST 1,953* 1,150* 407*  ALT 625* 505* 310*  ALKPHOS 237* 190* 151*  BILITOT 2.1* 1.7* 1.0  PROT 6.7 5.9* 6.4*  ALBUMIN 3.7 3.1* 3.3*   Recent Labs  Lab 05/10/23 0115 05/11/23 0514  LIPASE 1,066* 729*   No results for input(s): "AMMONIA" in the last 168 hours. Coagulation Profile: No results for input(s): "INR", "PROTIME" in the last 168 hours. Cardiac Enzymes: No results for input(s): "CKTOTAL", "CKMB", "CKMBINDEX", "TROPONINI" in the last 168 hours. BNP (last 3 results) No results for input(s): "PROBNP" in the last 8760 hours. HbA1C: No results for input(s):  "HGBA1C" in the last 72 hours. CBG: Recent Labs  Lab 05/10/23 2343 05/11/23 0614 05/11/23 1209  GLUCAP 112* 99 105*   Lipid Profile: Recent Labs    05/10/23 0713  TRIG 35   Thyroid Function Tests: No results for input(s): "TSH", "T4TOTAL", "FREET4", "T3FREE", "THYROIDAB" in the last 72 hours. Anemia Panel: No results for input(s): "VITAMINB12", "FOLATE", "FERRITIN", "TIBC", "IRON", "RETICCTPCT" in the last 72 hours. Sepsis Labs: Recent Labs  Lab 05/10/23 0115 05/10/23 0340 05/10/23 0713 05/10/23 0906  LATICACIDVEN 1.5 2.2* 1.6 1.3    Recent Results (from the past 240 hours)  Blood culture (routine x 2)     Status: Abnormal (Preliminary result)   Collection Time: 05/10/23  3:40 AM   Specimen: BLOOD  Result Value Ref Range Status   Specimen Description   Final    BLOOD LEFT ANTECUBITAL Performed at [facility], [address] [place], [place] [address]    Special Requests   Final    BOTTLES DRAWN AEROBIC AND ANAEROBIC Blood Culture adequate volume Performed at [facility], [address] [place], [place] [address]    Culture  Setup Time   Final    GRAM NEGATIVE RODS ANAEROBIC BOTTLE ONLY CRITICAL RESULT CALLED TO, READ BACK BY AND VERIFIED WITH: PHARMD Alwyn Ren [id number] @1957  FH    Culture (A)  Final    ESCHERICHIA COLI SUSCEPTIBILITIES TO FOLLOW Performed at [facility], [address], [place], [place] [address]    Report Status PENDING  Incomplete  Blood Culture ID Panel (Reflexed)     Status: Abnormal   Collection Time: 05/10/23  3:40 AM  Result Value Ref Range Status   Enterococcus faecalis NOT DETECTED NOT DETECTED Final   Enterococcus Faecium NOT DETECTED NOT DETECTED Final   Listeria monocytogenes NOT DETECTED NOT DETECTED Final   Staphylococcus species NOT DETECTED NOT DETECTED Final   Staphylococcus aureus (BCID) NOT DETECTED NOT DETECTED Final   Staphylococcus epidermidis NOT DETECTED NOT DETECTED  Final   Staphylococcus lugdunensis NOT DETECTED NOT DETECTED Final   Streptococcus species NOT DETECTED NOT DETECTED Final   Streptococcus agalactiae NOT DETECTED NOT DETECTED Final   Streptococcus pneumoniae NOT DETECTED NOT DETECTED Final   Streptococcus pyogenes NOT DETECTED NOT DETECTED Final   A.calcoaceticus-baumannii NOT DETECTED NOT DETECTED Final   Bacteroides fragilis NOT DETECTED NOT DETECTED Final   Enterobacterales DETECTED (A) NOT DETECTED Final    Comment: Enterobacterales represent a large order of gram negative bacteria, not a single organism. CRITICAL RESULT CALLED TO, READ BACK BY AND VERIFIED WITH: PHARMD AWeston Anna [id number] @1955  FH    Enterobacter  cloacae complex NOT DETECTED NOT DETECTED Final   Escherichia coli DETECTED (A) NOT DETECTED Final    Comment: CRITICAL RESULT CALLED TO, READ BACK BY AND VERIFIED WITH: PHARMD AWeston Anna [id number] @1955  FH    Klebsiella aerogenes NOT DETECTED NOT DETECTED Final   Klebsiella oxytoca NOT DETECTED NOT DETECTED Final   Klebsiella pneumoniae NOT DETECTED NOT DETECTED Final   Proteus species NOT DETECTED NOT DETECTED Final   Salmonella species NOT DETECTED NOT DETECTED Final   Serratia marcescens NOT DETECTED NOT DETECTED Final   Haemophilus influenzae NOT DETECTED NOT DETECTED Final   Neisseria meningitidis NOT DETECTED NOT DETECTED Final   Pseudomonas aeruginosa NOT DETECTED NOT DETECTED Final   Stenotrophomonas maltophilia NOT DETECTED NOT DETECTED Final   Candida albicans NOT DETECTED NOT DETECTED Final   Candida auris NOT DETECTED NOT DETECTED Final   Candida glabrata NOT DETECTED NOT DETECTED Final   Candida krusei NOT DETECTED NOT DETECTED Final   Candida parapsilosis NOT DETECTED NOT DETECTED Final   Candida tropicalis NOT DETECTED NOT DETECTED Final   Cryptococcus neoformans/gattii NOT DETECTED NOT DETECTED Final   CTX-M ESBL NOT DETECTED NOT DETECTED Final   Carbapenem resistance IMP NOT DETECTED NOT DETECTED  Final   Carbapenem resistance KPC NOT DETECTED NOT DETECTED Final   Carbapenem resistance NDM NOT DETECTED NOT DETECTED Final   Carbapenem resist OXA 48 LIKE NOT DETECTED NOT DETECTED Final   Carbapenem resistance VIM NOT DETECTED NOT DETECTED Final    Comment: Performed at [facility], [address], [place], [place] [address]  Blood culture (routine x 2)     Status: None (Preliminary result)   Collection Time: 05/10/23  3:45 AM   Specimen: BLOOD  Result Value Ref Range Status   Specimen Description   Final    BLOOD RIGHT ANTECUBITAL Performed at [facility], [address], [place], [place] [address]    Special Requests   Final    BOTTLES DRAWN AEROBIC AND ANAEROBIC Blood Culture adequate volume Performed at [facility], [address] [place], [place] [address]    Culture  Setup Time   Final    GRAM NEGATIVE RODS BOTTLES DRAWN AEROBIC ONLY CRITICAL VALUE NOTED.  VALUE IS CONSISTENT WITH PREVIOUSLY REPORTED AND CALLED VALUE.    Culture   Final    GRAM NEGATIVE RODS IDENTIFICATION TO FOLLOW Performed at [facility], [address], [place], [place] [address]    Report Status PENDING  Incomplete         Radiology Studies: CT ABDOMEN PELVIS W CONTRAST Result Date: 05/10/2023 CLINICAL DATA:  89 year old female with abdominal pain. EXAM: CT ABDOMEN AND PELVIS WITH CONTRAST TECHNIQUE: Multidetector CT imaging of the abdomen and pelvis was performed using the standard protocol following bolus administration of intravenous contrast. RADIATION DOSE REDUCTION: This exam was performed according to the departmental dose-optimization program which includes automated exposure control, adjustment of the mA and/or kV according to patient size and/or use of iterative reconstruction technique. CONTRAST:  75mL OMNIPAQUE IOHEXOL 300 MG/ML  SOLN COMPARISON:  Right upper quadrant ultrasound earlier today. FINDINGS: Lower chest: Borderline to  mild cardiomegaly. No pericardial or pleural effusion. Plate-like, streaky bilateral lung base atelectasis. Hepatobiliary: Mild hepatic periportal edema. Liver enhancement otherwise within normal limits. Inflammation, edema at the porta hepatis. CBD mildly dilated up to 9 mm diameter (coronal image 46). No CBD filling defect. Gallbladder remains within normal limits by CT. Pancreas: Confluent inflammation and edema throughout the  lesser sac with indistinct appearance of the pancreas, especially the head and uncinate. Streak artifact from lumbar spine hardware and spinal stimulator device also limit some detail of the pancreas. No main ductal dilatation is identified. No obvious mass or necrosis. Associated mildly fluid distended proximal stomach and proximal duodenum. The distal stomach and distal duodenum are both decompressed. Secondary inflammation on both structures. Small volume of free fluid emanating from the lesser sac, no drainable or organized fluid collection is identified. Spleen: Trace perisplenic free fluid. Splenic enhancement remains normal. Adrenals/Urinary Tract: Normal adrenal glands. Nonobstructed kidneys with symmetric renal enhancement and contrast excretion 2 diminutive ureters. Stomach/Bowel: Nondilated large and small bowel throughout the abdomen and pelvis. Intermittent large bowel diverticulosis. Appendix diminutive or absent. No pneumoperitoneum. Vascular/Lymphatic: Aortoiliac calcified atherosclerosis. Major arterial structures remain patent. Normal caliber abdominal aorta. Portal venous system including the splenic vein is patent. No lymphadenopathy identified. Reproductive: Within normal limits. Other: No pelvis free fluid. Musculoskeletal: Advanced spine degeneration superimposed on 3 level lumbar decompression and fusion. And thoracic spinal stimulator partially visible, right flank generator device. No acute osseous abnormality identified. IMPRESSION: 1. Acute Pancreatitis. Some  pancreatic detail limited from artifact due to lumbar spine hardware and spinal stimulator device. No obvious pancreatic mass or necrosis. 2. Confluent inflammation, edema emanating from the lesser sac. Secondary inflammation of the stomach and duodenum. Small volume of free fluid in the abdomen but no organized or drainable fluid collection identified. 3. Inflammation at the porta hepatis with mildly dilated CBD (9 mm), and mild periportal edema. 4. Mild lung base atelectasis. Aortic Atherosclerosis (ICD10-I70.0). Electronically Signed   By: Odessa Fleming M.D.   On: 05/10/2023 04:19   US Abdomen Limited RUQ (LIVER/GB) Result Date: 05/10/2023 CLINICAL DATA:  Acute right upper quadrant pain. EXAM: ULTRASOUND ABDOMEN LIMITED RIGHT UPPER QUADRANT COMPARISON:  CTA chest including upper abdomen 12/02/2021. FINDINGS: Gallbladder: No gallstones or wall thickening visualized. No sonographic Murphy sign noted by sonographer. There is a small amount of hypoechoic layering sludge within the lumen. Common bile duct: Diameter: 6.3 mm.  No intrahepatic bile duct dilatation. Liver: No focal lesion identified. Within normal limits in parenchymal echogenicity. Portal vein is patent on color Doppler imaging with normal direction of blood flow towards the liver. Other: None. IMPRESSION: 1. Small amount of gallbladder sludge. No gallstones or wall thickening. 2. Common bile duct 6.3 mm. Correlate with LFTs. No intrahepatic bile duct dilatation. Electronically Signed   By: Almira Bar M.D.   On: 05/10/2023 03:51        Scheduled Meds:  flecainide  50 mg Oral Daily   metoprolol tartrate  25 mg Oral BID   Continuous Infusions:  cefTRIAXone (ROCEPHIN)  IV 2 g (05/10/23 2125)   heparin 650 Units/hr (05/10/23 1900)   lactated ringers 125 mL/hr at 05/11/23 0816     LOS: 1 day    Time spent: 45 minutes spent on chart review, discussion with nursing staff, consultants, updating family and interview/physical exam; more than  50% of that time was spent in counseling and/or coordination of care.    Joseph Art, DO [facility] Available via Epic secure chat 7am-7pm After these hours, please refer to coverage provider listed on [email or web address] 05/11/2023, 1:02 PM

## 2023-05-11 NOTE — Evaluation (Signed)
 Physical Therapy Evaluation Patient Details Name: Cheryl [name] MRN: [id number] DOB: 11-Sep-1934 Today's Date: 05/11/2023  History of Present Illness  Cheryl [name] is a 89 y.o. female admitted with acute pancreatitis. PMH: afib, htn, chronic kidney disease stage III, hyperlipidemia  Clinical Impression  Pt admitted with above diagnosis. Pt from home alone, using rollator at baseline, good family support, lives in single level home with level entry. On eval, pt initially needing supv-CGA with gait and mobility, mobilizing in room and restroom with RW, clearing obstacles. Pt amb with RW in hallway with step through gait pattern, decreased activity tolerance noted with increased BLE shakiness and unsteadiness needing min A, pt needing quick seated rest break and return to room via recliner chair. Pt denies dizziness, lightheadedness, reports "just weak" and "I can't eat, can only have liquids". Rec HHPT and family support; on eval pt and daughter politely decline HHPT rec, report active with orthopedic for L knee pain and pt has taken OPPT previously without success. Pt currently with functional limitations due to the deficits listed below (see PT Problem List). Pt will benefit from acute skilled PT to increase their independence and safety with mobility to allow discharge.           If plan is discharge home, recommend the following: Assistance with cooking/housework;Assist for transportation   Can travel by private vehicle        Equipment Recommendations None recommended by PT  Recommendations for Other Services       Functional Status Assessment Patient has had a recent decline in their functional status and demonstrates the ability to make significant improvements in function in a reasonable and predictable amount of time.     Precautions / Restrictions Precautions Precautions: Fall Restrictions Weight Bearing Restrictions Per Provider Order: No      Mobility  Bed Mobility Overal  bed mobility: Needs Assistance Bed Mobility: Sit to Supine       Sit to supine: Min assist   General bed mobility comments: min A to lift BLE back into bed, slide up in bed and repostion to comfort    Transfers Overall transfer level: Needs assistance Equipment used: Rolling walker (2 wheels) Transfers: Sit to/from Stand, Bed to chair/wheelchair/BSC Sit to Stand: Supervision   Step pivot transfers: Contact guard assist       General transfer comment: supv to CGA with STS and step pivot transfers with RW, BUE assisting to power up    Ambulation/Gait Ambulation/Gait assistance: Contact guard assist, Min assist Gait Distance (Feet): 60 Feet Assistive device: Rolling walker (2 wheels) Gait Pattern/deviations: Step-through pattern, Decreased stride length, Trunk flexed Gait velocity: decreased     General Gait Details: step through gait pattern, trunk forward flexed, with distance pt with BLE shakiness and increasead unsteadiness needing min A and quick seated rest break, returned to room in Best boy     Tilt Bed    Modified Rankin (Stroke Patients Only)       Balance Overall balance assessment: Needs assistance Sitting-balance support: Feet supported Sitting balance-Leahy Scale: Good     Standing balance support: Reliant on assistive device for balance, During functional activity, Bilateral upper extremity supported Standing balance-Leahy Scale: Poor                               Pertinent Vitals/Pain Pain Assessment Pain Assessment: Faces  Faces Pain Scale: Hurts little more Pain Location: L knee, back Pain Descriptors / Indicators: Discomfort Pain Intervention(s): Limited activity within patient's tolerance, Monitored during session    Home Living Family/patient expects to be discharged to:: Private residence Living Arrangements: Alone Available Help at Discharge: Family Type of Home: House Home  Access: Level entry       Home Layout: One level Home Equipment: Rollator (4 wheels);Cane - single point      Prior Function Prior Level of Function : Independent/Modified Independent             Mobility Comments: pt ind with rollator walker ADLs Comments: pt ind with self care, family assists with household chores as needed     Extremity/Trunk Assessment   Upper Extremity Assessment Upper Extremity Assessment: Overall WFL for tasks assessed    Lower Extremity Assessment Lower Extremity Assessment: Generalized weakness (AROM WFL, strength grossly 3+/5)    Cervical / Trunk Assessment Cervical / Trunk Assessment: Kyphotic  Communication   Communication Communication: Impaired (HOH)    Cognition Arousal: Alert Behavior During Therapy: WFL for tasks assessed/performed   PT - Cognitive impairments: No apparent impairments                         Following commands: Intact       Cueing       General Comments      Exercises     Assessment/Plan    PT Assessment Patient needs continued PT services  PT Problem List Decreased strength;Decreased activity tolerance;Decreased balance;Decreased mobility;Pain       PT Treatment Interventions DME instruction;Gait training;Functional mobility training;Therapeutic activities;Therapeutic exercise;Balance training;Cognitive remediation;Patient/family education    PT Goals (Current goals can be found in the Care Plan section)  Acute Rehab PT Goals Patient Stated Goal: return home with family support, no HHPT or OPPT PT Goal Formulation: With patient/family Time For Goal Achievement: 05/25/23 Potential to Achieve Goals: Good    Frequency Min 2X/week     Co-evaluation               AM-PAC PT "6 Clicks" Mobility  Outcome Measure Help needed turning from your back to your side while in a flat bed without using bedrails?: A Little Help needed moving from lying on your back to sitting on the side of  a flat bed without using bedrails?: A Little Help needed moving to and from a bed to a chair (including a wheelchair)?: A Little Help needed standing up from a chair using your arms (e.g., wheelchair or bedside chair)?: A Little Help needed to walk in hospital room?: A Little Help needed climbing 3-5 steps with a railing? : A Lot 6 Click Score: 17    End of Session Equipment Utilized During Treatment: Gait belt Activity Tolerance: Patient tolerated treatment well Patient left: in bed;with call bell/phone within reach;with bed alarm set;with family/visitor present Nurse Communication: Mobility status PT Visit Diagnosis: Unsteadiness on feet (R26.81);Muscle weakness (generalized) (M62.81)    Time: 0981-1914 PT Time Calculation (min) (ACUTE ONLY): 26 min   Charges:   PT Evaluation $PT Eval Low Complexity: 1 Low PT Treatments $Gait Training: 8-22 mins PT General Charges $$ ACUTE PT VISIT: 1 Visit         Tori Jemila Camille PT, DPT 05/11/23, 3:36 PM

## 2023-05-11 NOTE — Progress Notes (Signed)
 PHARMACY - ANTICOAGULATION CONSULT NOTE  Pharmacy Consult for IV Heparin (PTA apixaban) Indication: atrial fibrillation  Allergies  Allergen Reactions   Cortisone Nausea And Vomiting and Other (See Comments)    "Made her deathly ill- had to be hospitalized due to sodium being so low from vomiting."   Codeine Phosphate Other (See Comments)    Headaches   Lipitor [Atorvastatin Calcium] Other (See Comments)    Muscle pain and tiredness   Sulfa Antibiotics Other (See Comments)    Headaches     Patient Measurements:   Wt 66kg from Nov 2024  Vital Signs: Temp: 98.1 F (36.7 C) (04/03 1950) Temp Source: Oral (04/03 1950) BP: 140/63 (04/03 1950) Pulse Rate: 80 (04/03 1950)  Labs: Recent Labs    05/10/23 0115 05/10/23 0600 05/10/23 0713 05/10/23 1627 05/11/23 0514  HGB 12.2  --  12.6  --  12.0  HCT 35.1*  --  36.6  --  34.9*  PLT 275  --  273  --  260  APTT  --   --   --  123* 97*  HEPARINUNFRC  --   --   --  >1.10* >1.10*  CREATININE 1.28*  --  1.20*  --   --   TROPONINIHS  --  20* 23*  --   --     CrCl cannot be calculated (Unknown ideal weight.).   Medical History: Past Medical History:  Diagnosis Date   Arthritis    CAD (coronary artery disease)    Mild per remote cath in 2004; normal nuclear in 2012   Carotid artery disease (HCC)    Chronic kidney disease    stage3 kidney disease    Complication of anesthesia    slow to wake up   Diastolic dysfunction    Grade I, per echo in 2012   GERD (gastroesophageal reflux disease)    prevacid   H/O hiatal hernia    Headache(784.0)    sinus   High risk medication use    Flecainide therapy   History of heartburn    Hyperlipidemia    Hypertension    LVH (left ventricular hypertrophy)    Per echo in July 2012   Normal nuclear stress test 08/07/2010   Obesity    PAF (paroxysmal atrial fibrillation) (HCC)    Pneumonia    20 yrs ago   Syncope 08/07/2010    Medications:  Scheduled:   flecainide  50 mg Oral  Daily   metoprolol tartrate  25 mg Oral BID   Infusions:   cefTRIAXone (ROCEPHIN)  IV 2 g (05/10/23 2125)   heparin 650 Units/hr (05/10/23 1900)   lactated ringers 125 mL/hr at 05/10/23 2316    Assessment: 89 yo admitted with acute pancreatitis on apixaban prior to admission for Afib. Per MD, to change to IV heparin. Last apixaban dose was 4/2 morning.  05/11/2023 aPTT 97 therapeutic on 650 units/hr Heparin still elevated due to effects of apixaban CBC WNL No complications of therapy noted  Goal of Therapy:  Heparin level 0.3-0.7 units/ml aPTT 66-102 seconds Monitor platelets by anticoagulation protocol: Yes   Plan:  -continue  heparin infusion at 650 units/hr -Confirmatory aPTT in 8 hours -Daily CBC -Continue to follow for transition back to apixaban as able   Arley Phenix RPh 05/11/2023, 5:44 AM

## 2023-05-11 NOTE — Consult Note (Signed)
 Consult Note  Cheryl [name] [birth year]  [id number].    Requesting MD: Dr. Benjamine Mola Chief Complaint/Reason for Consult: pancreatitis - likely gallstone etiology  HPI:  89 y.o. female with medical history significant for paroxysmal afib, prior CVA, HTN, CKD III, HLD who presented to [facility] ED 4/3 with abdominal pain and vomiting which began that day. Work up showed lipase 1066, WBC 19, T. bili 2.1 and LFTs elevated.  Imaging showed acute pancreatitis, gallbladder with sludge but no stones.  She was admitted to the hospitalist service and gastroenterology and general surgery consulted.  Today she tells me she has had improvement in her abdominal pain.  No further nausea or vomiting.  She has had 2 loose bowel movements.  She has never experienced episodes of similar symptoms and denies history of gallbladder problems  Substance use: Denies Allergies: Cortisone, codeine, Lipitor, sulfa antibiotics Blood thinners: eliquis LD 4/1 pm Past Surgeries: Open appendectomy, laparoscopic tubal ligation  At baseline she lives alone and is independent.  She ambulates with a walker   ROS: Reviewed and as above  Family History  Problem Relation Age of Onset   Lung cancer Father    Stroke Paternal Grandmother     Past Medical History:  Diagnosis Date   Arthritis    CAD (coronary artery disease)    Mild per remote cath in 2004; normal nuclear in 2012   Carotid artery disease (HCC)    Chronic kidney disease    stage3 kidney disease    Complication of anesthesia    slow to wake up   Diastolic dysfunction    Grade I, per echo in 2012   GERD (gastroesophageal reflux disease)    prevacid   H/O hiatal hernia    Headache(784.0)    sinus   High risk medication use    Flecainide therapy   History of heartburn    Hyperlipidemia    Hypertension    LVH (left ventricular hypertrophy)    Per echo in July 2012   Normal nuclear stress test 08/07/2010   Obesity    PAF (paroxysmal atrial  fibrillation) (HCC)    Pneumonia    20 yrs ago   Syncope 08/07/2010    Past Surgical History:  Procedure Laterality Date   APPENDECTOMY  1952   BACK SURGERY  1980, 2010   x2   CARDIAC CATHETERIZATION  07/28/2002   EF GREATER THAN 55%; MILD CAD   COLONOSCOPY     DILATION AND CURETTAGE OF UTERUS     LUMBAR LAMINECTOMY/DECOMPRESSION MICRODISCECTOMY Right 02/15/2015   Procedure: Right Thoracic twelve-Lumbar one  Microdiskectomy;  Surgeon: Tressie Stalker, MD;  Location: [facility];  Service: Neurosurgery;  Laterality: Right;  Thoracic/Lumbar   TONSILLECTOMY     age 89 yrs   TUBAL LIGATION      Social History:  reports that she has never smoked. She has never used smokeless tobacco. She reports that she does not drink alcohol and does not use drugs.  Allergies:  Allergies  Allergen Reactions   Cortisone Nausea And Vomiting and Other (See Comments)    "Made her deathly ill- had to be hospitalized due to sodium being so low from vomiting."   Codeine Phosphate Other (See Comments)    Headaches   Lipitor [Atorvastatin Calcium] Other (See Comments)    Muscle pain and tiredness   Sulfa Antibiotics Other (See Comments)    Headaches     Medications Prior to Admission  Medication Sig Dispense Refill  alendronate (FOSAMAX) 70 MG tablet Take 70 mg by mouth every Tuesday.     amLODipine (NORVASC) 5 MG tablet Take 5 mg by mouth at bedtime.     Biotin 10 MG CAPS Take 10 mg by mouth daily.     cyclobenzaprine (FLEXERIL) 5 MG tablet Take 1 tablet (5 mg total) by mouth 3 (three) times daily as needed for muscle spasms. 30 tablet 0   ELIQUIS 5 MG TABS tablet TAKE 1 TABLET BY MOUTH TWICE DAILY 180 tablet 1   famotidine (PEPCID) 20 MG tablet Take 20 mg by mouth 2 (two) times daily.     flecainide (TAMBOCOR) 50 MG tablet Take 1 tablet (50 mg total) by mouth daily. (Patient taking differently: Take 50 mg by mouth at bedtime.) 90 tablet 1   HYDROcodone-acetaminophen (NORCO/VICODIN) 5-325 MG tablet  Take 1 tablet by mouth 2 (two) times daily as needed (for pain).     loratadine (CLARITIN) 10 MG tablet Take 10 mg by mouth daily as needed for allergies.     metoprolol tartrate (LOPRESSOR) 25 MG tablet Take 1 tablet (25 mg total) by mouth 2 (two) times daily. 60 tablet 5   rosuvastatin (CRESTOR) 5 MG tablet Take 5 mg by mouth See admin instructions. Take 5 mg by mouth at bedtime on Mondays and Thursdays     traZODone (DESYREL) 100 MG tablet Take 1 tablet (100 mg total) by mouth at bedtime as needed for sleep. (Patient taking differently: Take 100 mg by mouth at bedtime.) 30 tablet 2    Blood pressure (!) 150/72, pulse 93, temperature 98.6 F (37 C), temperature source Oral, resp. rate 20, height 5\' 3"  (1.6 m), weight 62.1 kg, SpO2 93%. Physical Exam: General: pleasant, WD, female who is laying in bed in NAD HEENT: head is normocephalic, atraumatic.  Sclera are noninjected.  Pupils equal and round. EOMs intact.  Ears and nose without any masses or lesions.  Mouth is pink and moist Lungs:  Respiratory effort nonlabored on room air Abd: soft, ND, no masses, hernias, or organomegaly, mild epigastric tenderness to palpation, moderate right upper quadrant tenderness to palpation with voluntary guarding MSK: all 4 extremities are symmetrical with no cyanosis, clubbing, or edema. Skin: warm and dry with no masses, lesions, or rashes Neuro: Cranial nerves 2-12 grossly intact, sensation is normal throughout Psych: A&Ox3 with an appropriate affect.    Results for orders placed or performed during the hospital encounter of 05/09/23 (from the past 48 hours)  CBC with Differential     Status: Abnormal   Collection Time: 05/10/23  1:15 AM  Result Value Ref Range   WBC 19.0 (H) 4.0 - 10.5 K/uL   RBC 3.73 (L) 3.87 - 5.11 MIL/uL   Hemoglobin 12.2 12.0 - 15.0 g/dL   HCT 40.9 (L) 81.1 - 91.4 %   MCV 94.1 80.0 - 100.0 fL   MCH 32.7 26.0 - 34.0 pg   MCHC 34.8 30.0 - 36.0 g/dL   RDW 78.2 95.6 - 21.3 %    Platelets 275 150 - 400 K/uL   nRBC 0.0 0.0 - 0.2 %   Neutrophils Relative % 93 %   Neutro Abs 17.6 (H) 1.7 - 7.7 K/uL   Lymphocytes Relative 1 %   Lymphs Abs 0.2 (L) 0.7 - 4.0 K/uL   Monocytes Relative 5 %   Monocytes Absolute 1.0 0.1 - 1.0 K/uL   Eosinophils Relative 0 %   Eosinophils Absolute 0.0 0.0 - 0.5 K/uL   Basophils Relative 0 %  Basophils Absolute 0.0 0.0 - 0.1 K/uL   Immature Granulocytes 1 %   Abs Immature Granulocytes 0.12 (H) 0.00 - 0.07 K/uL    Comment: Performed at [facility], [address] [place], [place] [address]  Comprehensive metabolic panel     Status: Abnormal   Collection Time: 05/10/23  1:15 AM  Result Value Ref Range   Sodium 131 (L) 135 - 145 mmol/L   Potassium 3.2 (L) 3.5 - 5.1 mmol/L   Chloride 93 (L) 98 - 111 mmol/L   CO2 27 22 - 32 mmol/L   Glucose, Bld 132 (H) 70 - 99 mg/dL    Comment: Glucose reference range applies only to samples taken after fasting for at least 8 hours.   BUN 22 8 - 23 mg/dL   Creatinine, Ser 1.91 (H) 0.44 - 1.00 mg/dL   Calcium 47.8 8.9 - 29.5 mg/dL   Total Protein 6.7 6.5 - 8.1 g/dL   Albumin 3.7 3.5 - 5.0 g/dL   AST 6,213 (H) 15 - 41 U/L   ALT 625 (H) 0 - 44 U/L   Alkaline Phosphatase 237 (H) 38 - 126 U/L   Total Bilirubin 2.1 (H) 0.0 - 1.2 mg/dL   GFR, Estimated 41 (L) >60 mL/min    Comment: (NOTE) Calculated using the CKD-EPI Creatinine Equation (2021)    Anion gap 11 5 - 15    Comment: Performed at [facility], [address], [place], [place] [address]  Lipase, blood     Status: Abnormal   Collection Time: 05/10/23  1:15 AM  Result Value Ref Range   Lipase 1,066 (H) 11 - 51 U/L    Comment: Performed at [facility], [address], [place], [place] [address]  Lactic acid, plasma     Status: None   Collection Time: 05/10/23  1:15 AM  Result Value Ref Range   Lactic Acid, Venous 1.5 0.5 - 1.9 mmol/L    Comment: Performed at [facility], [address] [place], [place] [address]  Urinalysis, Routine w reflex microscopic -Urine, Clean Catch     Status: Abnormal   Collection Time: 05/10/23  3:31 AM  Result Value Ref Range   Color, Urine AMBER (A) YELLOW    Comment: BIOCHEMICALS MAY BE AFFECTED BY COLOR   APPearance CLOUDY (A) CLEAR   Specific Gravity, Urine 1.010 1.005 - 1.030   pH 7.0 5.0 - 8.0   Glucose, UA NEGATIVE NEGATIVE mg/dL   Hgb urine dipstick NEGATIVE NEGATIVE   Bilirubin Urine NEGATIVE NEGATIVE   Ketones, ur NEGATIVE NEGATIVE mg/dL   Protein, ur NEGATIVE NEGATIVE mg/dL   Nitrite NEGATIVE NEGATIVE   Leukocytes,Ua NEGATIVE NEGATIVE    Comment: Performed at [facility], [address] [place], [place] [address]  Lactic acid, plasma     Status: Abnormal   Collection Time: 05/10/23  3:40 AM  Result Value Ref Range   Lactic Acid, Venous 2.2 (HH) 0.5 - 1.9 mmol/L    Comment: CRITICAL RESULT CALLED TO, READ BACK BY AND VERIFIED WITH [place], D RN @ 0407 ON 05/10/2023 BY MTA Performed at [facility], [address] [place], [place] [address]   Blood culture (routine x 2)     Status: Abnormal (Preliminary result)   Collection Time: 05/10/23  3:40 AM   Specimen: BLOOD  Result Value Ref Range   Specimen Description      BLOOD LEFT ANTECUBITAL Performed at [facility], [address] [place], [place] [address]    Special Requests      BOTTLES DRAWN AEROBIC AND ANAEROBIC Blood Culture adequate volume Performed at [facility], [address], [place], [place] [address]    Culture  Setup Time      GRAM NEGATIVE RODS ANAEROBIC BOTTLE ONLY CRITICAL RESULT CALLED TO, READ BACK BY AND VERIFIED WITH: PHARMD AWeston Anna [id number] @1957  FH    Culture (A)     ESCHERICHIA COLI SUSCEPTIBILITIES TO FOLLOW Performed at [facility], [address], [place], [place] [address]    Report Status PENDING   Blood Culture ID Panel  (Reflexed)     Status: Abnormal   Collection Time: 05/10/23  3:40 AM  Result Value Ref Range   Enterococcus faecalis NOT DETECTED NOT DETECTED   Enterococcus Faecium NOT DETECTED NOT DETECTED   Listeria monocytogenes NOT DETECTED NOT DETECTED   Staphylococcus species NOT DETECTED NOT DETECTED   Staphylococcus aureus (BCID) NOT DETECTED NOT DETECTED   Staphylococcus epidermidis NOT DETECTED NOT DETECTED   Staphylococcus lugdunensis NOT DETECTED NOT DETECTED   Streptococcus species NOT DETECTED NOT DETECTED   Streptococcus agalactiae NOT DETECTED NOT DETECTED   Streptococcus pneumoniae NOT DETECTED NOT DETECTED   Streptococcus pyogenes NOT DETECTED NOT DETECTED   A.calcoaceticus-baumannii NOT DETECTED NOT DETECTED   Bacteroides fragilis NOT DETECTED NOT DETECTED   Enterobacterales DETECTED (A) NOT DETECTED    Comment: Enterobacterales represent a large order of gram negative bacteria, not a single organism. CRITICAL RESULT CALLED TO, READ BACK BY AND VERIFIED WITH: PHARMD AWeston Anna [id number] @1957  FH    Enterobacter cloacae complex NOT DETECTED NOT DETECTED   Escherichia coli DETECTED (A) NOT DETECTED    Comment: CRITICAL RESULT CALLED TO, READ BACK BY AND VERIFIED WITH: PHARMD AWeston Anna [id number] @1957  FH    Klebsiella aerogenes NOT DETECTED NOT DETECTED   Klebsiella oxytoca NOT DETECTED NOT DETECTED   Klebsiella pneumoniae NOT DETECTED NOT DETECTED   Proteus species NOT DETECTED NOT DETECTED   Salmonella species NOT DETECTED NOT DETECTED   Serratia marcescens NOT DETECTED NOT DETECTED   Haemophilus influenzae NOT DETECTED NOT DETECTED   Neisseria meningitidis NOT DETECTED NOT DETECTED   Pseudomonas aeruginosa NOT DETECTED NOT DETECTED   Stenotrophomonas maltophilia NOT DETECTED NOT DETECTED   Candida albicans NOT DETECTED NOT DETECTED   Candida auris NOT DETECTED NOT DETECTED   Candida glabrata NOT DETECTED NOT DETECTED   Candida krusei NOT DETECTED NOT DETECTED   Candida  parapsilosis NOT DETECTED NOT DETECTED   Candida tropicalis NOT DETECTED NOT DETECTED   Cryptococcus neoformans/gattii NOT DETECTED NOT DETECTED   CTX-M ESBL NOT DETECTED NOT DETECTED   Carbapenem resistance IMP NOT DETECTED NOT DETECTED   Carbapenem resistance KPC NOT DETECTED NOT DETECTED   Carbapenem resistance NDM NOT DETECTED NOT DETECTED   Carbapenem resist OXA 48 LIKE NOT DETECTED NOT DETECTED   Carbapenem resistance VIM NOT DETECTED NOT DETECTED    Comment: Performed at [facility], [address], [place], [place] [address]  Blood culture (routine x 2)     Status: None (Preliminary result)   Collection Time: 05/10/23  3:45 AM   Specimen: BLOOD  Result Value Ref Range   Specimen Description      BLOOD RIGHT ANTECUBITAL Performed at [facility], [address] [place], [place] [address]    Special Requests      BOTTLES DRAWN AEROBIC AND ANAEROBIC Blood Culture  adequate volume Performed at [facility], [address] [place], [place] [address]    Culture  Setup Time      GRAM NEGATIVE RODS BOTTLES DRAWN AEROBIC ONLY CRITICAL VALUE NOTED.  VALUE IS CONSISTENT WITH PREVIOUSLY REPORTED AND CALLED VALUE.    Culture      GRAM NEGATIVE RODS IDENTIFICATION TO FOLLOW Performed at [facility], [address] [place], [place] [address]    Report Status PENDING   Troponin I (High Sensitivity)     Status: Abnormal   Collection Time: 05/10/23  6:00 AM  Result Value Ref Range   Troponin I (High Sensitivity) 20 (H) <18 ng/L    Comment: (NOTE) Elevated high sensitivity troponin I (hsTnI) values and significant  changes across serial measurements may suggest ACS but many other  chronic and acute conditions are known to elevate hsTnI results.  Refer to the "Links" section for chest pain algorithms and additional  guidance. Performed at [facility], [address] [place], [place] [address]   Basic  metabolic panel     Status: Abnormal   Collection Time: 05/10/23  7:13 AM  Result Value Ref Range   Sodium 134 (L) 135 - 145 mmol/L   Potassium 3.5 3.5 - 5.1 mmol/L   Chloride 99 98 - 111 mmol/L   CO2 25 22 - 32 mmol/L   Glucose, Bld 127 (H) 70 - 99 mg/dL    Comment: Glucose reference range applies only to samples taken after fasting for at least 8 hours.   BUN 19 8 - 23 mg/dL   Creatinine, Ser 4.69 (H) 0.44 - 1.00 mg/dL   Calcium 9.4 8.9 - 62.9 mg/dL   GFR, Estimated 44 (L) >60 mL/min    Comment: (NOTE) Calculated using the CKD-EPI Creatinine Equation (2021)    Anion gap 10 5 - 15    Comment: Performed at [facility], [address] [place], [place] [address]  Hepatic function panel     Status: Abnormal   Collection Time: 05/10/23  7:13 AM  Result Value Ref Range   Total Protein 5.9 (L) 6.5 - 8.1 g/dL   Albumin 3.1 (L) 3.5 - 5.0 g/dL   AST 3,244 (H) 15 - 41 U/L   ALT 505 (H) 0 - 44 U/L   Alkaline Phosphatase 190 (H) 38 - 126 U/L   Total Bilirubin 1.7 (H) 0.0 - 1.2 mg/dL   Bilirubin, Direct 0.6 (H) 0.0 - 0.2 mg/dL   Indirect Bilirubin 1.1 (H) 0.3 - 0.9 mg/dL    Comment: Performed at [facility], [address], [place], [place] [address]  CBC with Differential/Platelet     Status: Abnormal   Collection Time: 05/10/23  7:13 AM  Result Value Ref Range   WBC 20.0 (H) 4.0 - 10.5 K/uL   RBC 3.80 (L) 3.87 - 5.11 MIL/uL   Hemoglobin 12.6 12.0 - 15.0 g/dL   HCT 25.3 66.4 - 40.3 %   MCV 96.3 80.0 - 100.0 fL   MCH 33.2 26.0 - 34.0 pg   MCHC 34.4 30.0 - 36.0 g/dL   RDW 47.4 25.9 - 56.3 %   Platelets 273 150 - 400 K/uL   nRBC 0.0 0.0 - 0.2 %   Neutrophils Relative % 91 %   Neutro Abs 18.3 (H) 1.7 - 7.7 K/uL   Lymphocytes Relative 3 %   Lymphs Abs 0.5 (L) 0.7 - 4.0 K/uL   Monocytes Relative 4 %  Monocytes Absolute 0.8 0.1 - 1.0 K/uL   Eosinophils Relative 0 %   Eosinophils Absolute 0.0 0.0 - 0.5 K/uL   Basophils Relative 0 %   Basophils  Absolute 0.0 0.0 - 0.1 K/uL   Immature Granulocytes 2 %   Abs Immature Granulocytes 0.35 (H) 0.00 - 0.07 K/uL    Comment: Performed at [facility] , [address] [place], [place] [address]  Triglycerides     Status: None   Collection Time: 05/10/23  7:13 AM  Result Value Ref Range   Triglycerides 35 <150 mg/dL    Comment: Performed at [facility], [address] [place], [place] [address]  Lactic acid, plasma     Status: None   Collection Time: 05/10/23  7:13 AM  Result Value Ref Range   Lactic Acid, Venous 1.6 0.5 - 1.9 mmol/L    Comment: Performed at [facility], [address] [place], [place] [address]  Troponin I (High Sensitivity)     Status: Abnormal   Collection Time: 05/10/23  7:13 AM  Result Value Ref Range   Troponin I (High Sensitivity) 23 (H) <18 ng/L    Comment: (NOTE) Elevated high sensitivity troponin I (hsTnI) values and significant  changes across serial measurements may suggest ACS but many other  chronic and acute conditions are known to elevate hsTnI results.  Refer to the "Links" section for chest pain algorithms and additional  guidance. Performed at [facility], [address] [place], [place] [address]   Lactic acid, plasma     Status: None   Collection Time: 05/10/23  9:06 AM  Result Value Ref Range   Lactic Acid, Venous 1.3 0.5 - 1.9 mmol/L    Comment: Performed at [facility], [address] [place], [place], [place] [address]  APTT     Status: Abnormal   Collection Time: 05/10/23  4:27 PM  Result Value Ref Range   aPTT 123 (H) 24 - 36 seconds    Comment:        IF BASELINE aPTT IS ELEVATED, SUGGEST PATIENT RISK ASSESSMENT BE USED TO DETERMINE APPROPRIATE ANTICOAGULANT THERAPY. Performed at [facility], [address], [place], [place] [address]   Heparin level (unfractionated)     Status: Abnormal   Collection Time: 05/10/23   4:27 PM  Result Value Ref Range   Heparin Unfractionated >1.10 (H) 0.30 - 0.70 IU/mL    Comment: (NOTE) The clinical reportable range upper limit is being lowered to >1.10 to align with the FDA approved guidance for the current laboratory assay.  If heparin results are below expected values, and patient dosage has  been confirmed, suggest follow up testing of antithrombin III levels. Performed at [facility], [address], [place], [place] [address]   Glucose, capillary     Status: Abnormal   Collection Time: 05/10/23 11:43 PM  Result Value Ref Range   Glucose-Capillary 112 (H) 70 - 99 mg/dL    Comment: Glucose reference range applies only to samples taken after fasting for at least 8 hours.  CBC     Status: Abnormal   Collection Time: 05/11/23  5:14 AM  Result Value Ref Range   WBC 20.6 (H) 4.0 - 10.5 K/uL   RBC 3.64 (L) 3.87 - 5.11 MIL/uL   Hemoglobin 12.0 12.0 - 15.0 g/dL   HCT 42.3 (L) 53.6 - 14.4 %   MCV 95.9 80.0 - 100.0 fL   MCH  33.0 26.0 - 34.0 pg   MCHC 34.4 30.0 - 36.0 g/dL   RDW 16.1 09.6 - 04.5 %   Platelets 260 150 - 400 K/uL   nRBC 0.0 0.0 - 0.2 %    Comment: Performed at [facility], [address], [place], [place] [address]  Comprehensive metabolic panel     Status: Abnormal   Collection Time: 05/11/23  5:14 AM  Result Value Ref Range   Sodium 129 (L) 135 - 145 mmol/L   Potassium 3.4 (L) 3.5 - 5.1 mmol/L   Chloride 95 (L) 98 - 111 mmol/L   CO2 21 (L) 22 - 32 mmol/L   Glucose, Bld 103 (H) 70 - 99 mg/dL    Comment: Glucose reference range applies only to samples taken after fasting for at least 8 hours.   BUN 16 8 - 23 mg/dL   Creatinine, Ser 1.91 0.44 - 1.00 mg/dL   Calcium 9.0 8.9 - 47.8 mg/dL   Total Protein 6.4 (L) 6.5 - 8.1 g/dL   Albumin 3.3 (L) 3.5 - 5.0 g/dL   AST 295 (H) 15 - 41 U/L   ALT 310 (H) 0 - 44 U/L   Alkaline Phosphatase 151 (H) 38 - 126 U/L   Total Bilirubin 1.0 0.0 - 1.2 mg/dL   GFR,  Estimated >62 >13 mL/min    Comment: (NOTE) Calculated using the CKD-EPI Creatinine Equation (2021)    Anion gap 13 5 - 15    Comment: Performed at [facility], [address] [place], [place] [address]  APTT     Status: Abnormal   Collection Time: 05/11/23  5:14 AM  Result Value Ref Range   aPTT 97 (H) 24 - 36 seconds    Comment:        IF BASELINE aPTT IS ELEVATED, SUGGEST PATIENT RISK ASSESSMENT BE USED TO DETERMINE APPROPRIATE ANTICOAGULANT THERAPY. Performed at [facility], [address], [place], [place] [address]   Heparin level (unfractionated)     Status: Abnormal   Collection Time: 05/11/23  5:14 AM  Result Value Ref Range   Heparin Unfractionated >1.10 (H) 0.30 - 0.70 IU/mL    Comment: (NOTE) The clinical reportable range upper limit is being lowered to >1.10 to align with the FDA approved guidance for the current laboratory assay.  If heparin results are below expected values, and patient dosage has  been confirmed, suggest follow up testing of antithrombin III levels. Performed at [facility], [address] [place], [place] [address]   Glucose, capillary     Status: None   Collection Time: 05/11/23  6:14 AM  Result Value Ref Range   Glucose-Capillary 99 70 - 99 mg/dL    Comment: Glucose reference range applies only to samples taken after fasting for at least 8 hours.   CT ABDOMEN PELVIS W CONTRAST Result Date: 05/10/2023 CLINICAL DATA:  89 year old female with abdominal pain. EXAM: CT ABDOMEN AND PELVIS WITH CONTRAST TECHNIQUE: Multidetector CT imaging of the abdomen and pelvis was performed using the standard protocol following bolus administration of intravenous contrast. RADIATION DOSE REDUCTION: This exam was performed according to the departmental dose-optimization program which includes automated exposure control, adjustment of the mA and/or kV according to patient size and/or use of iterative  reconstruction technique. CONTRAST:  75mL OMNIPAQUE IOHEXOL 300 MG/ML  SOLN COMPARISON:  Right upper quadrant ultrasound earlier today. FINDINGS: Lower chest: Borderline to mild cardiomegaly. No pericardial or pleural effusion. Plate-like, streaky bilateral  lung base atelectasis. Hepatobiliary: Mild hepatic periportal edema. Liver enhancement otherwise within normal limits. Inflammation, edema at the porta hepatis. CBD mildly dilated up to 9 mm diameter (coronal image 46). No CBD filling defect. Gallbladder remains within normal limits by CT. Pancreas: Confluent inflammation and edema throughout the lesser sac with indistinct appearance of the pancreas, especially the head and uncinate. Streak artifact from lumbar spine hardware and spinal stimulator device also limit some detail of the pancreas. No main ductal dilatation is identified. No obvious mass or necrosis. Associated mildly fluid distended proximal stomach and proximal duodenum. The distal stomach and distal duodenum are both decompressed. Secondary inflammation on both structures. Small volume of free fluid emanating from the lesser sac, no drainable or organized fluid collection is identified. Spleen: Trace perisplenic free fluid. Splenic enhancement remains normal. Adrenals/Urinary Tract: Normal adrenal glands. Nonobstructed kidneys with symmetric renal enhancement and contrast excretion 2 diminutive ureters. Stomach/Bowel: Nondilated large and small bowel throughout the abdomen and pelvis. Intermittent large bowel diverticulosis. Appendix diminutive or absent. No pneumoperitoneum. Vascular/Lymphatic: Aortoiliac calcified atherosclerosis. Major arterial structures remain patent. Normal caliber abdominal aorta. Portal venous system including the splenic vein is patent. No lymphadenopathy identified. Reproductive: Within normal limits. Other: No pelvis free fluid. Musculoskeletal: Advanced spine degeneration superimposed on 3 level lumbar decompression and  fusion. And thoracic spinal stimulator partially visible, right flank generator device. No acute osseous abnormality identified. IMPRESSION: 1. Acute Pancreatitis. Some pancreatic detail limited from artifact due to lumbar spine hardware and spinal stimulator device. No obvious pancreatic mass or necrosis. 2. Confluent inflammation, edema emanating from the lesser sac. Secondary inflammation of the stomach and duodenum. Small volume of free fluid in the abdomen but no organized or drainable fluid collection identified. 3. Inflammation at the porta hepatis with mildly dilated CBD (9 mm), and mild periportal edema. 4. Mild lung base atelectasis. Aortic Atherosclerosis (ICD10-I70.0). Electronically Signed   By: Odessa Fleming M.D.   On: 05/10/2023 04:19   US Abdomen Limited RUQ (LIVER/GB) Result Date: 05/10/2023 CLINICAL DATA:  Acute right upper quadrant pain. EXAM: ULTRASOUND ABDOMEN LIMITED RIGHT UPPER QUADRANT COMPARISON:  CTA chest including upper abdomen 12/02/2021. FINDINGS: Gallbladder: No gallstones or wall thickening visualized. No sonographic Murphy sign noted by sonographer. There is a small amount of hypoechoic layering sludge within the lumen. Common bile duct: Diameter: 6.3 mm.  No intrahepatic bile duct dilatation. Liver: No focal lesion identified. Within normal limits in parenchymal echogenicity. Portal vein is patent on color Doppler imaging with normal direction of blood flow towards the liver. Other: None. IMPRESSION: 1. Small amount of gallbladder sludge. No gallstones or wall thickening. 2. Common bile duct 6.3 mm. Correlate with LFTs. No intrahepatic bile duct dilatation. Electronically Signed   By: Almira Bar M.D.   On: 05/10/2023 03:51      Assessment/Plan Pancreatitis Gallbladder sludge, ? Choledocholithiasis Bacteremia  Patient seen and examined and relevant labs and imaging personally reviewed.  She has acute pancreatitis with likely biliary etiology with elevated LFTs and total  bilirubin on admission.  LFTs improving and bilirubin now normal.  Possibly passed stone. Lipase initially over 1000 and now 729.  Repeat in the a.m.  GI is following and MRCP recommended but unable to be completed due to spinal stimulator.  Her symptoms have improved and she is tolerating clear liquids but has ongoing epigastric and right upper quadrant pain.  With symptoms and bacteremia (blood cultures with gram-negative rods) concern for infection related to her gallbladder.  Urgent/emergent surgical intervention not  indicated at this time but ultimately recommend laparoscopic cholecystectomy with IOC this admission however will need to allow pancreatitis to continue to improve. Continue IV abx. Repeat labs am  FEN: CLD ID: Rocephin VTE: hep gtt  Paroxysmal A-fib -Eliquis at baseline.  Last dose for 1 PM CKD 3 Hypertension Hyperlipidemia Prior CVA  I reviewed ED provider notes, Consultant GI notes, hospitalist notes, last 24 h vitals and pain scores, last 48 h intake and output, last 24 h labs and trends, and last 24 h imaging results.   Eric Form, [facility] 05/11/2023, 10:55 AM Please see Amion for pager number during day hours 7:00am-4:30pm

## 2023-05-11 NOTE — TOC Initial Note (Signed)
 Transition of Care [facility]) - Initial/Assessment Note    Patient Details  Name: Cheryl [name] MRN: [id number] Date of Birth: 1934/11/02  [facility] CM/SW Contact:    Lanier Clam, RN Phone Number: 05/11/2023, 3:51 PM  Clinical Narrative:  Iantha Fallen rep Amy accepted for HHPT.                  Expected Discharge Plan: Home w Home Health Services Barriers to Discharge: Continued Medical Work up   Patient Goals and CMS Choice Patient states their goals for this hospitalization and ongoing recovery are:: Home CMS Medicare.gov Compare Post Acute Care list provided to:: Patient Choice offered to / list presented to : Patient [place] ownership interest in [facility].provided to:: Patient    Expected Discharge Plan and Services   Discharge Planning Services: CM Consult Post Acute Care Choice: Home Health Living arrangements for the past 2 months: Single Family Home                           HH Arranged: PT HH Agency: [facility] Date [facility] Agency Contacted: 05/11/23 Time HH Agency Contacted: 1551 Representative spoke with at [facility] Agency: Amy  Prior Living Arrangements/Services Living arrangements for the past 2 months: Single Family Home Lives with:: Self   Do you feel safe going back to the place where you live?: Yes          Current home services: DME (rw,3n1)    Activities of Daily Living   ADL Screening (condition at time of admission) Independently performs ADLs?: Yes (appropriate for developmental age) Is the patient deaf or have difficulty hearing?: Yes (has aides but didn't bring to hospital) Does the patient have difficulty seeing, even when wearing glasses/contacts?: Yes Does the patient have difficulty concentrating, remembering, or making decisions?: No  Permission Sought/Granted Permission sought to share information with : Case Manager Permission granted to share information with : Yes, Verbal Permission Granted               Emotional Assessment              Admission diagnosis:  Acute pancreatitis [K85.90] Gallstone pancreatitis [K85.10] Patient Active Problem List   Diagnosis Date Noted   Acute pancreatitis 05/10/2023   History of CVA (cerebrovascular accident) 05/10/2023   Right knee DJD 11/25/2019   Chondrocalcinosis due to dicalcium phosphate crystals, of the knee, right 11/25/2019   Acute CVA (cerebrovascular accident) (HCC) 11/10/2017   Hypertensive emergency 11/10/2017   S/P insertion of spinal cord stimulator 10/11/2017   Chronic pain syndrome 10/11/2017   Post laminectomy syndrome 10/11/2017   Radiculopathy, lumbar region 10/11/2017   Headache 02/23/2017   Nausea & vomiting 02/23/2017   Hyponatremia 02/23/2017   Hypertensive urgency 02/23/2017   Lumbar herniated disc 02/15/2015   CKD (chronic kidney disease) stage 3, GFR 30-59 ml/min (HCC) 06/22/2014   Spondylolisthesis of lumbar region 08/07/2013   Hyperlipidemia 06/13/2008   PAF (paroxysmal atrial fibrillation) (HCC) 06/13/2008   Essential hypertension 11/05/2006   Coronary atherosclerosis 11/05/2006   PCP:  Laurann Montana, MD Pharmacy:   [facility] [phone] Gin[place] Otto, [place] - [address] AT [facility] [address] [place] [address] Phone: [phone] Fax: [phone]     Social Drivers of Health (SDOH) Social History: SDOH Screenings   Food Insecurity: No Food Insecurity (05/10/2023)  Housing: Low Risk  (05/10/2023)  Transportation Needs: No Transportation Needs (05/10/2023)  Utilities: Not  At Risk (05/10/2023)  Social Connections: Moderately Isolated (05/10/2023)  Tobacco Use: Low Risk  (05/11/2023)   SDOH Interventions:     Readmission Risk Interventions     No data to display

## 2023-05-11 NOTE — Progress Notes (Signed)
 PHARMACY - ANTICOAGULATION CONSULT NOTE  Pharmacy Consult for IV Heparin (PTA apixaban) Indication: atrial fibrillation  Allergies  Allergen Reactions   Cortisone Nausea And Vomiting and Other (See Comments)    "Made her deathly ill- had to be hospitalized due to sodium being so low from vomiting."   Codeine Phosphate Other (See Comments)    Headaches   Lipitor [Atorvastatin Calcium] Other (See Comments)    Muscle pain and tiredness   Sulfa Antibiotics Other (See Comments)    Headaches     Patient Measurements: Height: 5\' 3"  (160 cm) Weight: 62.1 kg (136 lb 14.5 oz) IBW/kg (Calculated) : 52.4 HEPARIN DW (KG): 62.1 Wt 66kg from Nov 2024  Vital Signs: Temp: 99 F (37.2 C) (04/04 1213) Temp Source: Oral (04/04 1213) BP: 134/60 (04/04 1213) Pulse Rate: 75 (04/04 1213)  Labs: Recent Labs    05/10/23 0115 05/10/23 0600 05/10/23 0713 05/10/23 1627 05/11/23 0514 05/11/23 1246  HGB 12.2  --  12.6  --  12.0  --   HCT 35.1*  --  36.6  --  34.9*  --   PLT 275  --  273  --  260  --   APTT  --   --   --  123* 97* 81*  HEPARINUNFRC  --   --   --  >1.10* >1.10*  --   CREATININE 1.28*  --  1.20*  --  0.85  --   TROPONINIHS  --  20* 23*  --   --   --     Estimated Creatinine Clearance: 39.3 mL/min (by C-G formula based on SCr of 0.85 mg/dL).   Medical History: Past Medical History:  Diagnosis Date   Arthritis    CAD (coronary artery disease)    Mild per remote cath in 2004; normal nuclear in 2012   Carotid artery disease (HCC)    Chronic kidney disease    stage3 kidney disease    Complication of anesthesia    slow to wake up   Diastolic dysfunction    Grade I, per echo in 2012   GERD (gastroesophageal reflux disease)    prevacid   H/O hiatal hernia    Headache(784.0)    sinus   High risk medication use    Flecainide therapy   History of heartburn    Hyperlipidemia    Hypertension    LVH (left ventricular hypertrophy)    Per echo in July 2012   Normal  nuclear stress test 08/07/2010   Obesity    PAF (paroxysmal atrial fibrillation) (HCC)    Pneumonia    20 yrs ago   Syncope 08/07/2010    Medications:  Scheduled:   flecainide  50 mg Oral Daily   metoprolol tartrate  25 mg Oral BID   Infusions:   cefTRIAXone (ROCEPHIN)  IV 2 g (05/10/23 2125)   heparin 650 Units/hr (05/10/23 1900)   lactated ringers 125 mL/hr at 05/11/23 0816    Assessment: 89 yo admitted with acute pancreatitis on apixaban prior to admission for Afib. Per MD, to change to IV heparin. Last apixaban dose was 4/2 morning.  05/11/2023 aPTT 81 therapeutic on 650 units/hr Heparin still elevated due to effects of apixaban CBC WNL No complications of therapy noted  Goal of Therapy:  Heparin level 0.3-0.7 units/ml aPTT 66-102 seconds Monitor platelets by anticoagulation protocol: Yes   Plan:  -continue  heparin infusion at 650 units/hr - Daily aPTT, HL  -Daily CBC -Continue to follow for transition back to  apixaban as able    Adalberto Cole, PharmD, BCPS 05/11/2023 2:30 PM

## 2023-05-11 NOTE — Progress Notes (Signed)
 Subjective: Diarrhea overnight. Abdominal pain resolved.  Objective: Vital signs in last 24 hours: Temp:  [97.6 F (36.4 C)-98.6 F (37 C)] 98.6 F (37 C) (04/04 0615) Pulse Rate:  [54-93] 93 (04/04 0615) Resp:  [20] 20 (04/04 0615) BP: (103-150)/(60-79) 150/72 (04/04 0615) SpO2:  [91 %-99 %] 93 % (04/04 0615) Weight:  [62.1 kg] 62.1 kg (04/04 0559) Weight change:     PE: GEN:  NAD ABD:  Soft, minimal diffuse tenderness without peritonitis  Lab Results: CBC    Component Value Date/Time   WBC 20.6 (H) 05/11/2023 0514   RBC 3.64 (L) 05/11/2023 0514   HGB 12.0 05/11/2023 0514   HGB 12.5 10/02/2022 1625   HCT 34.9 (L) 05/11/2023 0514   HCT 37.6 10/02/2022 1625   PLT 260 05/11/2023 0514   PLT 287 10/02/2022 1625   MCV 95.9 05/11/2023 0514   MCV 97 10/02/2022 1625   MCH 33.0 05/11/2023 0514   MCHC 34.4 05/11/2023 0514   RDW 13.1 05/11/2023 0514   RDW 12.3 10/02/2022 1625   LYMPHSABS 0.5 (L) 05/10/2023 0713   MONOABS 0.8 05/10/2023 0713   EOSABS 0.0 05/10/2023 0713   BASOSABS 0.0 05/10/2023 0713  CMP     Component Value Date/Time   NA 129 (L) 05/11/2023 0514   NA 135 10/02/2022 1625   K 3.4 (L) 05/11/2023 0514   CL 95 (L) 05/11/2023 0514   CO2 21 (L) 05/11/2023 0514   GLUCOSE 103 (H) 05/11/2023 0514   BUN 16 05/11/2023 0514   BUN 20 10/02/2022 1625   CREATININE 0.85 05/11/2023 0514   CALCIUM 9.0 05/11/2023 0514   PROT 6.4 (L) 05/11/2023 0514   ALBUMIN 3.3 (L) 05/11/2023 0514   AST 407 (H) 05/11/2023 0514   ALT 310 (H) 05/11/2023 0514   ALKPHOS 151 (H) 05/11/2023 0514   BILITOT 1.0 05/11/2023 0514   GFR 53.87 (L) 07/13/2010 0834   EGFR 40 (L) 10/02/2022 1625   GFRNONAA >60 05/11/2023 0514    Assessment:   Diarrhea, suspect antibiotic-associated. Abdominal pain, likely from pancreatitis, resolved. Gram negative rod bacteremia.  Gallbladder/bile duct related most likely. Elevated LFTs, significantly downtrending. Gallbladder sludge  Plan:    Antibiotics. Surgical consultation for consideration of cholecystectomy + IOC.  Patient's hardware precludes MRCP, but pain has resolved and LFTs significantly decreased. Clear liquid diet only. [name] will follow.   Cheryl [name] 05/11/2023, 10:34 AM   Cell [phone] If no answer or after 5 PM call [phone]

## 2023-05-12 DIAGNOSIS — K851 Biliary acute pancreatitis without necrosis or infection: Secondary | ICD-10-CM | POA: Diagnosis not present

## 2023-05-12 LAB — COMPREHENSIVE METABOLIC PANEL WITH GFR
ALT: 207 U/L — ABNORMAL HIGH (ref 0–44)
AST: 165 U/L — ABNORMAL HIGH (ref 15–41)
Albumin: 3.1 g/dL — ABNORMAL LOW (ref 3.5–5.0)
Alkaline Phosphatase: 136 U/L — ABNORMAL HIGH (ref 38–126)
Anion gap: 12 (ref 5–15)
BUN: 9 mg/dL (ref 8–23)
CO2: 20 mmol/L — ABNORMAL LOW (ref 22–32)
Calcium: 8.8 mg/dL — ABNORMAL LOW (ref 8.9–10.3)
Chloride: 98 mmol/L (ref 98–111)
Creatinine, Ser: 0.83 mg/dL (ref 0.44–1.00)
GFR, Estimated: >60 mL/min (ref >60)
Glucose, Bld: 89 mg/dL (ref 70–99)
Potassium: 3.4 mmol/L — ABNORMAL LOW (ref 3.5–5.1)
Sodium: 130 mmol/L — ABNORMAL LOW (ref 135–145)
Total Bilirubin: 1 mg/dL (ref 0.0–1.2)
Total Protein: 6.4 g/dL — ABNORMAL LOW (ref 6.5–8.1)

## 2023-05-12 LAB — GLUCOSE, CAPILLARY
Glucose-Capillary: 101 mg/dL — ABNORMAL HIGH (ref 70–99)
Glucose-Capillary: 89 mg/dL (ref 70–99)
Glucose-Capillary: 94 mg/dL (ref 70–99)
Glucose-Capillary: 95 mg/dL (ref 70–99)

## 2023-05-12 LAB — HEPARIN LEVEL (UNFRACTIONATED): Heparin Unfractionated: >1.1 [IU]/mL — ABNORMAL HIGH (ref 0.30–0.70)

## 2023-05-12 LAB — CBC
HCT: 35.1 % — ABNORMAL LOW (ref 36.0–46.0)
Hemoglobin: 11.7 g/dL — ABNORMAL LOW (ref 12.0–15.0)
MCH: 32.3 pg (ref 26.0–34.0)
MCHC: 33.3 g/dL (ref 30.0–36.0)
MCV: 97 fL (ref 80.0–100.0)
Platelets: 240 10*3/uL (ref 150–400)
RBC: 3.62 MIL/uL — ABNORMAL LOW (ref 3.87–5.11)
RDW: 13.1 % (ref 11.5–15.5)
WBC: 16.3 10*3/uL — ABNORMAL HIGH (ref 4.0–10.5)
nRBC: 0 % (ref 0.0–0.2)

## 2023-05-12 LAB — APTT
aPTT: 59 s — ABNORMAL HIGH (ref 24–36)
aPTT: 68 s — ABNORMAL HIGH (ref 24–36)

## 2023-05-12 LAB — LIPASE, BLOOD: Lipase: 192 U/L — ABNORMAL HIGH (ref 11–51)

## 2023-05-12 NOTE — Progress Notes (Signed)
 LFTs downtrending.  Surgeons plan chole + IOC once this admission.  [name] will follow from a distance; please call back if needed.

## 2023-05-12 NOTE — Progress Notes (Signed)
 PROGRESS NOTE    Cheryl [name]  ZOX:[id number] DOB: [birth year] DOA: 05/09/2023 PCP: Laurann Montana, MD    Brief Narrative:  Cheryl [name] is a 89 y.o. female with history of paroxysmal atrial fibrillation, hypertension, chronic kidney disease stage III, hyperlipidemia presents to the ER with complaints of abdominal pain.  Patient's pain started last evening around 4 PM.  Had multiple episodes of vomiting.  Pain is mostly in the epigastric area nonradiating.  Patient initially tried some Gas-X with no relief decided to come to the ER.  MRCP not able to be done due to implanted device.  GI and GS consulted for recommendations   Assessment and Plan: Acute pancreatitis with markedly elevated LFTs  - suspect gallstone pancreatitis.  Mildly dilated common bile duct.  No definite gallstones seen on CAT scan or ultrasound.   - Dr. Dulce Sellar gastroenterology has been consulted.   -unable to have MRI due to spinal cord stimulator- note from 10/19: MRI talked with [facility] regarding spinal stimulator and was advised that the wires and generator is not safe to undergo a MRI.  -GS consult for consideration of cholecystectomy + IOC   + blood culture/bacteremia- e coli IV abx added    Paroxysmal atrial fibrillation  -takes Tambocor and metoprolol.  - Hold Eliquis:  last dose was 4/2 AM   Prior history of CVA  -presently on heparin infusion in place of eliquis -hold statin   Hypertension  -hold amlodipine.   -Continue metoprolol   Chronic kidney disease stage III  -IVF and daily labs.    DVT prophylaxis:     Code Status: Full Code Family Communication: called daughter   Disposition Plan:  Level of care: Progressive Status is: Inpatient     Consultants:  [facility] GI    Subjective: Knows she needs surgery Slept better last PM   Objective: Vitals:   05/11/23 1213 05/11/23 2041 05/12/23 0416 05/12/23 1216  BP: 134/60 (!) 160/78 (!) 164/78 (!) 153/74  Pulse: 75 95 83 81   Resp: 16 18 16 16   Temp: 99 F (37.2 C) 98.9 F (37.2 C) 98.6 F (37 C) 98.5 F (36.9 C)  TempSrc: Oral Oral Oral Oral  SpO2: 96% 97% 96% 99%  Weight:      Height:        Intake/Output Summary (Last 24 hours) at 05/12/2023 1219 Last data filed at 05/11/2023 1930 Gross per 24 hour  Intake 1726.02 ml  Output --  Net 1726.02 ml   Filed Weights   05/11/23 0559  Weight: 62.1 kg    Examination:    General: Appearance:    Well developed, well nourished female in no acute distress     Lungs:     Clear to auscultation bilaterally, respirations unlabored  Heart:    Normal heart rate.    MS:   All extremities are intact.   Neurologic:   Awake, alert      Data Reviewed: I have personally reviewed following labs and imaging studies  CBC: Recent Labs  Lab 05/10/23 0115 05/10/23 0713 05/11/23 0514 05/12/23 0524  WBC 19.0* 20.0* 20.6* 16.3*  NEUTROABS 17.6* 18.3*  --   --   HGB 12.2 12.6 12.0 11.7*  HCT 35.1* 36.6 34.9* 35.1*  MCV 94.1 96.3 95.9 97.0  PLT 275 273 260 240   Basic Metabolic Panel: Recent Labs  Lab 05/10/23 0115 05/10/23 0713 05/11/23 0514 05/12/23 0524  NA 131* 134* 129* 130*  K 3.2* 3.5 3.4* 3.4*  CL 93* 99 95* 98  CO2 27 25 21* 20*  GLUCOSE 132* 127* 103* 89  BUN 22 19 16 9   CREATININE 1.28* 1.20* 0.85 0.83  CALCIUM 10.2 9.4 9.0 8.8*   GFR: Estimated Creatinine Clearance: 40.2 mL/min (by C-G formula based on SCr of 0.83 mg/dL). Liver Function Tests: Recent Labs  Lab 05/10/23 0115 05/10/23 0713 05/11/23 0514 05/12/23 0524  AST 1,953* 1,150* 407* 165*  ALT 625* 505* 310* 207*  ALKPHOS 237* 190* 151* 136*  BILITOT 2.1* 1.7* 1.0 1.0  PROT 6.7 5.9* 6.4* 6.4*  ALBUMIN 3.7 3.1* 3.3* 3.1*   Recent Labs  Lab 05/10/23 0115 05/11/23 0514 05/12/23 0524  LIPASE 1,066* 729* 192*   No results for input(s): "AMMONIA" in the last 168 hours. Coagulation Profile: No results for input(s): "INR", "PROTIME" in the last 168 hours. Cardiac  Enzymes: No results for input(s): "CKTOTAL", "CKMB", "CKMBINDEX", "TROPONINI" in the last 168 hours. BNP (last 3 results) No results for input(s): "PROBNP" in the last 8760 hours. HbA1C: No results for input(s): "HGBA1C" in the last 72 hours. CBG: Recent Labs  Lab 05/11/23 1209 05/11/23 1814 05/11/23 2347 05/12/23 0607 05/12/23 1202  GLUCAP 105* 86 104* 89 95   Lipid Profile: Recent Labs    05/10/23 0713  TRIG 35   Thyroid Function Tests: No results for input(s): "TSH", "T4TOTAL", "FREET4", "T3FREE", "THYROIDAB" in the last 72 hours. Anemia Panel: No results for input(s): "VITAMINB12", "FOLATE", "FERRITIN", "TIBC", "IRON", "RETICCTPCT" in the last 72 hours. Sepsis Labs: Recent Labs  Lab 05/10/23 0115 05/10/23 0340 05/10/23 0713 05/10/23 0906  LATICACIDVEN 1.5 2.2* 1.6 1.3    Recent Results (from the past 240 hours)  Blood culture (routine x 2)     Status: Abnormal (Preliminary result)   Collection Time: 05/10/23  3:40 AM   Specimen: BLOOD  Result Value Ref Range Status   Specimen Description   Final    BLOOD LEFT ANTECUBITAL Performed at [facility], [address] [place], [place] [address]    Special Requests   Final    BOTTLES DRAWN AEROBIC AND ANAEROBIC Blood Culture adequate volume Performed at [facility], [address], [place], [place] [address]    Culture  Setup Time   Final    GRAM NEGATIVE RODS ANAEROBIC BOTTLE ONLY CRITICAL RESULT CALLED TO, READ BACK BY AND VERIFIED WITH: PHARMD Alwyn Ren [id number] @1955  FH    Culture (A)  Final    ESCHERICHIA COLI CONFIRMATION OF SUSCEPTIBILITIES IN PROGRESS Performed at [facility], [address], [place], [place] [address]    Report Status PENDING  Incomplete   Organism ID, Bacteria ESCHERICHIA COLI  Final      Susceptibility   Escherichia coli - KIRBY BAUER*    CEFAZOLIN SENSITIVE Sensitive     * ESCHERICHIA COLI  Blood Culture ID Panel (Reflexed)      Status: Abnormal   Collection Time: 05/10/23  3:40 AM  Result Value Ref Range Status   Enterococcus faecalis NOT DETECTED NOT DETECTED Final   Enterococcus Faecium NOT DETECTED NOT DETECTED Final   Listeria monocytogenes NOT DETECTED NOT DETECTED Final   Staphylococcus species NOT DETECTED NOT DETECTED Final   Staphylococcus aureus (BCID) NOT DETECTED NOT DETECTED Final   Staphylococcus epidermidis NOT DETECTED NOT DETECTED Final   Staphylococcus lugdunensis NOT DETECTED NOT DETECTED Final   Streptococcus species NOT DETECTED NOT DETECTED Final   Streptococcus agalactiae NOT DETECTED NOT DETECTED Final   Streptococcus pneumoniae  NOT DETECTED NOT DETECTED Final   Streptococcus pyogenes NOT DETECTED NOT DETECTED Final   A.calcoaceticus-baumannii NOT DETECTED NOT DETECTED Final   Bacteroides fragilis NOT DETECTED NOT DETECTED Final   Enterobacterales DETECTED (A) NOT DETECTED Final    Comment: Enterobacterales represent a large order of gram negative bacteria, not a single organism. CRITICAL RESULT CALLED TO, READ BACK BY AND VERIFIED WITH: PHARMD AWeston Anna [id number] @1955  FH    Enterobacter cloacae complex NOT DETECTED NOT DETECTED Final   Escherichia coli DETECTED (A) NOT DETECTED Final    Comment: CRITICAL RESULT CALLED TO, READ BACK BY AND VERIFIED WITH: PHARMD AWeston Anna [id number] @1957  FH    Klebsiella aerogenes NOT DETECTED NOT DETECTED Final   Klebsiella oxytoca NOT DETECTED NOT DETECTED Final   Klebsiella pneumoniae NOT DETECTED NOT DETECTED Final   Proteus species NOT DETECTED NOT DETECTED Final   Salmonella species NOT DETECTED NOT DETECTED Final   Serratia marcescens NOT DETECTED NOT DETECTED Final   Haemophilus influenzae NOT DETECTED NOT DETECTED Final   Neisseria meningitidis NOT DETECTED NOT DETECTED Final   Pseudomonas aeruginosa NOT DETECTED NOT DETECTED Final   Stenotrophomonas maltophilia NOT DETECTED NOT DETECTED Final   Candida albicans NOT DETECTED NOT DETECTED  Final   Candida auris NOT DETECTED NOT DETECTED Final   Candida glabrata NOT DETECTED NOT DETECTED Final   Candida krusei NOT DETECTED NOT DETECTED Final   Candida parapsilosis NOT DETECTED NOT DETECTED Final   Candida tropicalis NOT DETECTED NOT DETECTED Final   Cryptococcus neoformans/gattii NOT DETECTED NOT DETECTED Final   CTX-M ESBL NOT DETECTED NOT DETECTED Final   Carbapenem resistance IMP NOT DETECTED NOT DETECTED Final   Carbapenem resistance KPC NOT DETECTED NOT DETECTED Final   Carbapenem resistance NDM NOT DETECTED NOT DETECTED Final   Carbapenem resist OXA 48 LIKE NOT DETECTED NOT DETECTED Final   Carbapenem resistance VIM NOT DETECTED NOT DETECTED Final    Comment: Performed at [facility], [address] [place], [place] [address]  Blood culture (routine x 2)     Status: Abnormal (Preliminary result)   Collection Time: 05/10/23  3:45 AM   Specimen: BLOOD  Result Value Ref Range Status   Specimen Description   Final    BLOOD RIGHT ANTECUBITAL Performed at [facility], [address], [place], [place] [address]    Special Requests   Final    BOTTLES DRAWN AEROBIC AND ANAEROBIC Blood Culture adequate volume Performed at [facility], [address] [place], [place] [address]    Culture  Setup Time   Final    GRAM NEGATIVE RODS BOTTLES DRAWN AEROBIC ONLY CRITICAL VALUE NOTED.  VALUE IS CONSISTENT WITH PREVIOUSLY REPORTED AND CALLED VALUE. Performed at [facility], [address], [place], [place] [address]    Culture ESCHERICHIA COLI (A)  Final   Report Status PENDING  Incomplete         Radiology Studies: No results found.       Scheduled Meds:  flecainide  50 mg Oral Daily   metoprolol tartrate  25 mg Oral BID   Continuous Infusions:  cefTRIAXone (ROCEPHIN)  IV 2 g (05/11/23 2114)   heparin 800 Units/hr (05/12/23 0613)     LOS: 2 days    Time spent: 45 minutes spent on chart review,  discussion with nursing staff, consultants, updating family and interview/physical exam; more than 50% of that time was spent in counseling and/or coordination of care.  Joseph Art, DO [facility] Available via Epic secure chat 7am-7pm After these hours, please refer to coverage provider listed on [email or web address] 05/12/2023, 12:19 PM

## 2023-05-12 NOTE — Progress Notes (Signed)
 PHARMACY - ANTICOAGULATION CONSULT NOTE  Pharmacy Consult for IV Heparin (PTA apixaban) Indication: atrial fibrillation  Allergies  Allergen Reactions   Cortisone Nausea And Vomiting and Other (See Comments)    "Made her deathly ill- had to be hospitalized due to sodium being so low from vomiting."   Codeine Phosphate Other (See Comments)    Headaches   Lipitor [Atorvastatin Calcium] Other (See Comments)    Muscle pain and tiredness   Sulfa Antibiotics Other (See Comments)    Headaches     Patient Measurements: Height: 5\' 3"  (160 cm) Weight: 62.1 kg (136 lb 14.5 oz) IBW/kg (Calculated) : 52.4 HEPARIN DW (KG): 62.1 Wt 66kg from Nov 2024  Vital Signs: Temp: 98.6 F (37 C) (04/05 0416) Temp Source: Oral (04/05 0416) BP: 164/78 (04/05 0416) Pulse Rate: 83 (04/05 0416)  Labs: Recent Labs    05/10/23 0115 05/10/23 0600 05/10/23 0713 05/10/23 0713 05/10/23 1627 05/11/23 0514 05/11/23 1246 05/12/23 0524  HGB 12.2  --  12.6  --   --  12.0  --  11.7*  HCT 35.1*  --  36.6  --   --  34.9*  --  35.1*  PLT 275  --  273  --   --  260  --  240  APTT  --   --   --    < > 123* 97* 81* 59*  HEPARINUNFRC  --   --   --   --  >1.10* >1.10*  --  >1.10*  CREATININE 1.28*  --  1.20*  --   --  0.85  --   --   TROPONINIHS  --  20* 23*  --   --   --   --   --    < > = values in this interval not displayed.    Estimated Creatinine Clearance: 39.3 mL/min (by C-G formula based on SCr of 0.85 mg/dL).   Medical History: Past Medical History:  Diagnosis Date   Arthritis    CAD (coronary artery disease)    Mild per remote cath in 2004; normal nuclear in 2012   Carotid artery disease (HCC)    Chronic kidney disease    stage3 kidney disease    Complication of anesthesia    slow to wake up   Diastolic dysfunction    Grade I, per echo in 2012   GERD (gastroesophageal reflux disease)    prevacid   H/O hiatal hernia    Headache(784.0)    sinus   High risk medication use     Flecainide therapy   History of heartburn    Hyperlipidemia    Hypertension    LVH (left ventricular hypertrophy)    Per echo in July 2012   Normal nuclear stress test 08/07/2010   Obesity    PAF (paroxysmal atrial fibrillation) (HCC)    Pneumonia    20 yrs ago   Syncope 08/07/2010    Medications:  Scheduled:   flecainide  50 mg Oral Daily   metoprolol tartrate  25 mg Oral BID   Infusions:   cefTRIAXone (ROCEPHIN)  IV 2 g (05/11/23 2114)   heparin 650 Units/hr (05/11/23 2118)    Assessment: 89 yo admitted with acute pancreatitis on apixaban prior to admission for Afib. Per MD, to change to IV heparin. Last apixaban dose was 4/2 morning.  05/12/2023 aPTT 59 subtherapeutic on 650 units/hr Heparin still elevated due to effects of apixaban Hgb 11.7, plts WNL No complications of therapy noted  Goal  of Therapy:  Heparin level 0.3-0.7 units/ml aPTT 66-102 seconds Monitor platelets by anticoagulation protocol: Yes   Plan:  -increase heparin drip to 800 units/hr - aPTT in 8 hours - Daily aPTT, HL  -Daily CBC -Continue to follow for transition back to apixaban as able    Arley Phenix RPh 05/12/2023, 6:01 AM

## 2023-05-12 NOTE — Progress Notes (Signed)
   Subjective/Chief Complaint: Still having upper abdominal pain, improved somewhat   Objective: Vital signs in last 24 hours: Temp:  [98.6 F (37 C)-99 F (37.2 C)] 98.6 F (37 C) (04/05 0416) Pulse Rate:  [75-95] 83 (04/05 0416) Resp:  [16-18] 16 (04/05 0416) BP: (134-164)/(60-78) 164/78 (04/05 0416) SpO2:  [96 %-97 %] 96 % (04/05 0416) Last BM Date : 05/11/23  Intake/Output from previous day: 04/04 0701 - 04/05 0700 In: 1726 [I.V.:1726] Out: -  Intake/Output this shift: No intake/output data recorded.  Exam: Up in a chair Awake and alert Abdomen full in the upper abdomen with some tenderness and guarding  Lab Results:  Recent Labs    05/11/23 0514 05/12/23 0524  WBC 20.6* 16.3*  HGB 12.0 11.7*  HCT 34.9* 35.1*  PLT 260 240   BMET Recent Labs    05/11/23 0514 05/12/23 0524  NA 129* 130*  K 3.4* 3.4*  CL 95* 98  CO2 21* 20*  GLUCOSE 103* 89  BUN 16 9  CREATININE 0.85 0.83  CALCIUM 9.0 8.8*   PT/INR No results for input(s): "LABPROT", "INR" in the last 72 hours. ABG No results for input(s): "PHART", "HCO3" in the last 72 hours.  Invalid input(s): "PCO2", "PO2"  Studies/Results: No results found.  Anti-infectives: Anti-infectives (From admission, onward)    Start     Dose/Rate Route Frequency Ordered Stop   05/10/23 2200  cefTRIAXone (ROCEPHIN) 2 g in sodium chloride 0.9 % 100 mL IVPB        2 g 200 mL/hr over 30 Minutes Intravenous Every 24 hours 05/10/23 2035     05/10/23 0245  piperacillin-tazobactam (ZOSYN) IVPB 3.375 g        3.375 g 100 mL/hr over 30 Minutes Intravenous  Once 05/10/23 0240 05/10/23 0436       Assessment/Plan: Pancreatitis Gallbladder sludge, ? Choledocholithiasis Bacteremia  -still with elevated lipase and WBC, though continuing to trend down. -will continue current care -likely lap chole with IOC early next week when pancreatitis improved further -last Eliquis was yesterday   Cheryl Miyamoto  MD 05/12/2023

## 2023-05-12 NOTE — Progress Notes (Signed)
 PHARMACY - ANTICOAGULATION CONSULT NOTE  Pharmacy Consult for IV Heparin (PTA apixaban) Indication: atrial fibrillation  Allergies  Allergen Reactions   Cortisone Nausea And Vomiting and Other (See Comments)    "Made her deathly ill- had to be hospitalized due to sodium being so low from vomiting."   Codeine Phosphate Other (See Comments)    Headaches   Lipitor [Atorvastatin Calcium] Other (See Comments)    Muscle pain and tiredness   Sulfa Antibiotics Other (See Comments)    Headaches     Patient Measurements: Height: 5\' 3"  (160 cm) Weight: 62.1 kg (136 lb 14.5 oz) IBW/kg (Calculated) : 52.4 HEPARIN DW (KG): 62.1 Wt 66kg from Nov 2024  Vital Signs: Temp: 98.5 F (36.9 C) (04/05 1216) Temp Source: Oral (04/05 1216) BP: 153/74 (04/05 1216) Pulse Rate: 81 (04/05 1216)  Labs: Recent Labs    05/10/23 0600 05/10/23 0713 05/10/23 0713 05/10/23 1627 05/11/23 0514 05/11/23 1246 05/12/23 0524 05/12/23 1448  HGB  --  12.6  --   --  12.0  --  11.7*  --   HCT  --  36.6  --   --  34.9*  --  35.1*  --   PLT  --  273  --   --  260  --  240  --   APTT  --   --    < > 123* 97* 81* 59* 68*  HEPARINUNFRC  --   --   --  >1.10* >1.10*  --  >1.10*  --   CREATININE  --  1.20*  --   --  0.85  --  0.83  --   TROPONINIHS 20* 23*  --   --   --   --   --   --    < > = values in this interval not displayed.    Estimated Creatinine Clearance: 40.2 mL/min (by C-G formula based on SCr of 0.83 mg/dL).   Medical History: Past Medical History:  Diagnosis Date   Arthritis    CAD (coronary artery disease)    Mild per remote cath in 2004; normal nuclear in 2012   Carotid artery disease (HCC)    Chronic kidney disease    stage3 kidney disease    Complication of anesthesia    slow to wake up   Diastolic dysfunction    Grade I, per echo in 2012   GERD (gastroesophageal reflux disease)    prevacid   H/O hiatal hernia    Headache(784.0)    sinus   High risk medication use     Flecainide therapy   History of heartburn    Hyperlipidemia    Hypertension    LVH (left ventricular hypertrophy)    Per echo in July 2012   Normal nuclear stress test 08/07/2010   Obesity    PAF (paroxysmal atrial fibrillation) (HCC)    Pneumonia    20 yrs ago   Syncope 08/07/2010    Medications:  Scheduled:   flecainide  50 mg Oral Daily   metoprolol tartrate  25 mg Oral BID   Infusions:   cefTRIAXone (ROCEPHIN)  IV 2 g (05/11/23 2114)   heparin 800 Units/hr (05/12/23 0272)    Assessment: 89 yo admitted with acute pancreatitis on apixaban prior to admission for Afib. Per MD, to change to IV heparin. Last apixaban dose was 4/2 morning.  05/12/2023 -aPTT 68 therapeutic on 800 units/hr -Heparin level still elevated due to effects of apixaban -Hgb 11.7, plts WNL -No complications  of therapy noted  Goal of Therapy:  Heparin level 0.3-0.7 units/ml aPTT 66-102 seconds Monitor platelets by anticoagulation protocol: Yes   Plan:  -Increase heparin drip to 850 units/hr -Recheck aPTT in 8 hours to confirm -Daily aPTT, HL  -Daily CBC -Note likely plan for lap chole early next week, surgery to determine need to hold heparin infusion once procedure time known  Pricilla Riffle, PharmD, BCPS Clinical Pharmacist 05/12/2023 3:51 PM

## 2023-05-13 DIAGNOSIS — K851 Biliary acute pancreatitis without necrosis or infection: Secondary | ICD-10-CM | POA: Diagnosis not present

## 2023-05-13 LAB — COMPREHENSIVE METABOLIC PANEL WITH GFR
ALT: 136 U/L — ABNORMAL HIGH (ref 0–44)
AST: 73 U/L — ABNORMAL HIGH (ref 15–41)
Albumin: 2.7 g/dL — ABNORMAL LOW (ref 3.5–5.0)
Alkaline Phosphatase: 117 U/L (ref 38–126)
Anion gap: 12 (ref 5–15)
BUN: 9 mg/dL (ref 8–23)
CO2: 17 mmol/L — ABNORMAL LOW (ref 22–32)
Calcium: 8.2 mg/dL — ABNORMAL LOW (ref 8.9–10.3)
Chloride: 102 mmol/L (ref 98–111)
Creatinine, Ser: 0.75 mg/dL (ref 0.44–1.00)
GFR, Estimated: >60 mL/min (ref >60)
Glucose, Bld: 90 mg/dL (ref 70–99)
Potassium: 3.5 mmol/L (ref 3.5–5.1)
Sodium: 131 mmol/L — ABNORMAL LOW (ref 135–145)
Total Bilirubin: 0.9 mg/dL (ref 0.0–1.2)
Total Protein: 5.5 g/dL — ABNORMAL LOW (ref 6.5–8.1)

## 2023-05-13 LAB — CULTURE, BLOOD (ROUTINE X 2)
Special Requests: ADEQUATE
Special Requests: ADEQUATE

## 2023-05-13 LAB — CBC
HCT: 31.2 % — ABNORMAL LOW (ref 36.0–46.0)
Hemoglobin: 10.9 g/dL — ABNORMAL LOW (ref 12.0–15.0)
MCH: 33.4 pg (ref 26.0–34.0)
MCHC: 34.9 g/dL (ref 30.0–36.0)
MCV: 95.7 fL (ref 80.0–100.0)
Platelets: 213 10*3/uL (ref 150–400)
RBC: 3.26 MIL/uL — ABNORMAL LOW (ref 3.87–5.11)
RDW: 12.9 % (ref 11.5–15.5)
WBC: 12.6 10*3/uL — ABNORMAL HIGH (ref 4.0–10.5)
nRBC: 0 % (ref 0.0–0.2)

## 2023-05-13 LAB — APTT: aPTT: 90 s — ABNORMAL HIGH (ref 24–36)

## 2023-05-13 LAB — GLUCOSE, CAPILLARY
Glucose-Capillary: 72 mg/dL (ref 70–99)
Glucose-Capillary: 74 mg/dL (ref 70–99)

## 2023-05-13 LAB — HEPARIN LEVEL (UNFRACTIONATED): Heparin Unfractionated: 0.88 [IU]/mL — ABNORMAL HIGH (ref 0.30–0.70)

## 2023-05-13 MED ORDER — ACETAMINOPHEN 325 MG PO TABS
650.0000 mg | ORAL_TABLET | Freq: Four times a day (QID) | ORAL | Status: DC | PRN
  Administered 2023-05-14 – 2023-05-15 (×2): 650 mg via ORAL
  Filled 2023-05-13 (×2): qty 2

## 2023-05-13 MED ORDER — ORAL CARE MOUTH RINSE: 15.0000 mL | OROMUCOSAL | Status: DC | PRN

## 2023-05-13 MED ORDER — HYDROXYZINE HCL 10 MG PO TABS
5.0000 mg | ORAL_TABLET | Freq: Once | ORAL | Status: AC | PRN
  Administered 2023-05-13: 5 mg via ORAL
  Filled 2023-05-13: qty 1

## 2023-05-13 NOTE — Progress Notes (Signed)
   Subjective/Chief Complaint: She feels better today Abdominal pain much less   Objective: Vital signs in last 24 hours: Temp:  [97.7 F (36.5 C)-98.5 F (36.9 C)] 97.7 F (36.5 C) (04/06 0903) Pulse Rate:  [81-95] 95 (04/06 0903) Resp:  [16-20] 19 (04/06 0545) BP: (144-153)/(74-81) 144/74 (04/06 0903) SpO2:  [97 %-99 %] 97 % (04/06 0903) Last BM Date : 05/12/23  Intake/Output from previous day: No intake/output data recorded. Intake/Output this shift: Total I/O In: 210 [IV Piggyback:210] Out: -   Exam: Awake and alert Looks much more comfortable Abdomen soft, minimally tender  Lab Results:  Recent Labs    05/12/23 0524 05/13/23 0056  WBC 16.3* 12.6*  HGB 11.7* 10.9*  HCT 35.1* 31.2*  PLT 240 213   BMET Recent Labs    05/12/23 0524 05/13/23 0056  NA 130* 131*  K 3.4* 3.5  CL 98 102  CO2 20* 17*  GLUCOSE 89 90  BUN 9 9  CREATININE 0.83 0.75  CALCIUM 8.8* 8.2*   PT/INR No results for input(s): "LABPROT", "INR" in the last 72 hours. ABG No results for input(s): "PHART", "HCO3" in the last 72 hours.  Invalid input(s): "PCO2", "PO2"  Studies/Results: No results found.  Anti-infectives: Anti-infectives (From admission, onward)    Start     Dose/Rate Route Frequency Ordered Stop   05/10/23 2200  cefTRIAXone (ROCEPHIN) 2 g in sodium chloride 0.9 % 100 mL IVPB        2 g 200 mL/hr over 30 Minutes Intravenous Every 24 hours 05/10/23 2035     05/10/23 0245  piperacillin-tazobactam (ZOSYN) IVPB 3.375 g        3.375 g 100 mL/hr over 30 Minutes Intravenous  Once 05/10/23 0240 05/10/23 0436       Assessment/Plan: Pancreatitis Gallbladder sludge, ? Choledocholithiasis Bacteremia  -LFT's and WBC continue to improve -clinically she looks better -will make her NPO at midnight in case lap chole and IOC can be done tomorrow depending on OR availability for our acute care surgeon of the week.  She agrees with the plan  [name]  MD 05/13/2023

## 2023-05-13 NOTE — Progress Notes (Signed)
 PHARMACY - ANTICOAGULATION CONSULT NOTE  Pharmacy Consult for IV Heparin (PTA apixaban) Indication: atrial fibrillation  Allergies  Allergen Reactions   Cortisone Nausea And Vomiting and Other (See Comments)    "Made her deathly ill- had to be hospitalized due to sodium being so low from vomiting."   Codeine Phosphate Other (See Comments)    Headaches   Lipitor [Atorvastatin Calcium] Other (See Comments)    Muscle pain and tiredness   Sulfa Antibiotics Other (See Comments)    Headaches     Patient Measurements: Height: 5\' 3"  (160 cm) Weight: 62.1 kg (136 lb 14.5 oz) IBW/kg (Calculated) : 52.4 HEPARIN DW (KG): 62.1 Wt 66kg from Nov 2024  Vital Signs: Temp: 98.2 F (36.8 C) (04/05 2034) Temp Source: Oral (04/05 2034) BP: 149/81 (04/05 2034) Pulse Rate: 95 (04/05 2034)  Labs: Recent Labs     0000 05/10/23 0600 05/10/23 0713 05/10/23 1627 05/11/23 0514 05/11/23 1246 05/12/23 0524 05/12/23 1448 05/13/23 0056  HGB   < >  --  12.6  --  12.0  --  11.7*  --  10.9*  HCT   < >  --  36.6  --  34.9*  --  35.1*  --  31.2*  PLT   < >  --  273  --  260  --  240  --  213  APTT  --   --   --    < > 97*   < > 59* 68* 90*  HEPARINUNFRC  --   --   --    < > >1.10*  --  >1.10*  --  0.88*  CREATININE   < >  --  1.20*  --  0.85  --  0.83  --  0.75  TROPONINIHS  --  20* 23*  --   --   --   --   --   --    < > = values in this interval not displayed.    Estimated Creatinine Clearance: 41.8 mL/min (by C-G formula based on SCr of 0.75 mg/dL).   Medical History: Past Medical History:  Diagnosis Date   Arthritis    CAD (coronary artery disease)    Mild per remote cath in 2004; normal nuclear in 2012   Carotid artery disease (HCC)    Chronic kidney disease    stage3 kidney disease    Complication of anesthesia    slow to wake up   Diastolic dysfunction    Grade I, per echo in 2012   GERD (gastroesophageal reflux disease)    prevacid   H/O hiatal hernia    Headache(784.0)     sinus   High risk medication use    Flecainide therapy   History of heartburn    Hyperlipidemia    Hypertension    LVH (left ventricular hypertrophy)    Per echo in July 2012   Normal nuclear stress test 08/07/2010   Obesity    PAF (paroxysmal atrial fibrillation) (HCC)    Pneumonia    20 yrs ago   Syncope 08/07/2010    Medications:  Scheduled:   flecainide  50 mg Oral Daily   metoprolol tartrate  25 mg Oral BID   Infusions:   cefTRIAXone (ROCEPHIN)  IV 2 g (05/12/23 2107)   heparin 850 Units/hr (05/12/23 1654)    Assessment: 89 yo admitted with acute pancreatitis on apixaban prior to admission for Afib. Per MD, to change to IV heparin. Last apixaban dose was 4/2 morning.  05/13/2023 -aPTT 90 therapeutic on 850 units/hr -Heparin level still elevated due to effects of apixaban -Hgb 10.9, plts WNL -No bleeding per RN  Goal of Therapy:  Heparin level 0.3-0.7 units/ml aPTT 66-102 seconds Monitor platelets by anticoagulation protocol: Yes   Plan:  -continue heparin drip at 850 units/hr -Daily aPTT, HL  -Daily CBC -Note likely plan for lap chole early next week, surgery to determine need to hold heparin infusion once procedure time known  Arley Phenix RPh 05/13/2023, 1:31 AM

## 2023-05-13 NOTE — Progress Notes (Signed)
 Mobility Specialist - Progress Note   05/13/23 1049  Mobility  Activity Ambulated with assistance in hallway;Ambulated with assistance to bathroom  Level of Assistance Contact guard assist, steadying assist  Assistive Device Front wheel walker  Distance Ambulated (ft) 24 ft  Range of Motion/Exercises Active  Activity Response Tolerated well  Mobility Referral Yes  Mobility visit 1 Mobility  Mobility Specialist Start Time (ACUTE ONLY) 1030  Mobility Specialist Stop Time (ACUTE ONLY) 1050  Mobility Specialist Time Calculation (min) (ACUTE ONLY) 20 min   Pt was found in bed and agreeable to ambulate. Stated having L knee arthritis. A little unsteady and limited distance. At EOS returned to bed with all needs met. Call bell in reach.  Billey Chang Mobility Specialist

## 2023-05-13 NOTE — Progress Notes (Signed)
 PROGRESS NOTE    Cheryl [name]  ZOX:[id number] DOB: 06-15-1934 DOA: 05/09/2023 PCP: Laurann Montana, MD    Brief Narrative:  Cheryl [name] is a 89 y.o. female with history of paroxysmal atrial fibrillation, hypertension, chronic kidney disease stage III, hyperlipidemia presents to the ER with complaints of abdominal pain.  Patient's pain started last evening around 4 PM.  Had multiple episodes of vomiting.  Pain is mostly in the epigastric area nonradiating.  Patient initially tried some Gas-X with no relief decided to come to the ER.  MRCP not able to be done due to implanted device.  GI and GS consulted for recommendations   Assessment and Plan: Acute pancreatitis with markedly elevated LFTs  - suspect gallstone pancreatitis.  Mildly dilated common bile duct.  No definite gallstones seen on CAT scan or ultrasound.   - Dr. Dulce Sellar gastroenterology has been consulted.   -unable to have MRI due to spinal cord stimulator- note from 10/19: MRI talked with [facility] regarding spinal stimulator and was advised that the wires and generator is not safe to undergo a MRI.  -GS consult for consideration of cholecystectomy + IOC   + blood culture/bacteremia- e coli IV abx  -plan for GB removal on 4/7    Paroxysmal atrial fibrillation  -takes Tambocor and metoprolol.  - Hold Eliquis:  last dose was 4/2 AM -on IV heparin   Prior history of CVA  -presently on heparin infusion in place of eliquis -hold statin   Hypertension  -hold amlodipine.   -Continue metoprolol   Chronic kidney disease stage III  -IVF and daily labs.    DVT prophylaxis:     Code Status: Full Code Family Communication: called daughter  27/5  Disposition Plan:  Level of care: Progressive Status is: Inpatient     Consultants:  GS GI    Subjective: hungry   Objective: Vitals:   05/12/23 1216 05/12/23 2034 05/13/23 0545 05/13/23 0903  BP: (!) 153/74 (!) 149/81 (!) 146/74 (!) 144/74  Pulse: 81 95  86 95  Resp: 16 20 19    Temp: 98.5 F (36.9 C) 98.2 F (36.8 C) 98.4 F (36.9 C) 97.7 F (36.5 C)  TempSrc: Oral Oral Oral Oral  SpO2: 99% 98% 97% 97%  Weight:      Height:        Intake/Output Summary (Last 24 hours) at 05/13/2023 1245 Last data filed at 05/13/2023 0802 Gross per 24 hour  Intake 210 ml  Output --  Net 210 ml   Filed Weights   05/11/23 0559  Weight: 62.1 kg    Examination:     General: Appearance:    Well developed, well nourished female in no acute distress     Lungs:      respirations unlabored  Heart:    Normal heart rate.   MS:   All extremities are intact.   Neurologic:   Awake, alert      Data Reviewed: I have personally reviewed following labs and imaging studies  CBC: Recent Labs  Lab 05/10/23 0115 05/10/23 0713 05/11/23 0514 05/12/23 0524 05/13/23 0056  WBC 19.0* 20.0* 20.6* 16.3* 12.6*  NEUTROABS 17.6* 18.3*  --   --   --   HGB 12.2 12.6 12.0 11.7* 10.9*  HCT 35.1* 36.6 34.9* 35.1* 31.2*  MCV 94.1 96.3 95.9 97.0 95.7  PLT 275 273 260 240 213   Basic Metabolic Panel: Recent Labs  Lab 05/10/23 0115 05/10/23 0713 05/11/23 0514 05/12/23 0524 05/13/23 0056  NA 131* 134* 129* 130* 131*  K 3.2* 3.5 3.4* 3.4* 3.5  CL 93* 99 95* 98 102  CO2 27 25 21* 20* 17*  GLUCOSE 132* 127* 103* 89 90  BUN 22 19 16 9 9   CREATININE 1.28* 1.20* 0.85 0.83 0.75  CALCIUM 10.2 9.4 9.0 8.8* 8.2*   GFR: Estimated Creatinine Clearance: 41.8 mL/min (by C-G formula based on SCr of 0.75 mg/dL). Liver Function Tests: Recent Labs  Lab 05/10/23 0115 05/10/23 0713 05/11/23 0514 05/12/23 0524 05/13/23 0056  AST 1,953* 1,150* 407* 165* 73*  ALT 625* 505* 310* 207* 136*  ALKPHOS 237* 190* 151* 136* 117  BILITOT 2.1* 1.7* 1.0 1.0 0.9  PROT 6.7 5.9* 6.4* 6.4* 5.5*  ALBUMIN 3.7 3.1* 3.3* 3.1* 2.7*   Recent Labs  Lab 05/10/23 0115 05/11/23 0514 05/12/23 0524  LIPASE 1,066* 729* 192*   No results for input(s): "AMMONIA" in the last 168  hours. Coagulation Profile: No results for input(s): "INR", "PROTIME" in the last 168 hours. Cardiac Enzymes: No results for input(s): "CKTOTAL", "CKMB", "CKMBINDEX", "TROPONINI" in the last 168 hours. BNP (last 3 results) No results for input(s): "PROBNP" in the last 8760 hours. HbA1C: No results for input(s): "HGBA1C" in the last 72 hours. CBG: Recent Labs  Lab 05/12/23 1202 05/12/23 1741 05/12/23 2349 05/13/23 0546 05/13/23 1159  GLUCAP 95 101* 94 72 74   Lipid Profile: No results for input(s): "CHOL", "HDL", "LDLCALC", "TRIG", "CHOLHDL", "LDLDIRECT" in the last 72 hours.  Thyroid Function Tests: No results for input(s): "TSH", "T4TOTAL", "FREET4", "T3FREE", "THYROIDAB" in the last 72 hours. Anemia Panel: No results for input(s): "VITAMINB12", "FOLATE", "FERRITIN", "TIBC", "IRON", "RETICCTPCT" in the last 72 hours. Sepsis Labs: Recent Labs  Lab 05/10/23 0115 05/10/23 0340 05/10/23 0713 05/10/23 0906  LATICACIDVEN 1.5 2.2* 1.6 1.3    Recent Results (from the past 240 hours)  Blood culture (routine x 2)     Status: Abnormal   Collection Time: 05/10/23  3:40 AM   Specimen: BLOOD  Result Value Ref Range Status   Specimen Description   Final    BLOOD LEFT ANTECUBITAL Performed at [facility], [address], [place], [place] [address]    Special Requests   Final    BOTTLES DRAWN AEROBIC AND ANAEROBIC Blood Culture adequate volume Performed at [facility], [address], [place], [place] [address]    Culture  Setup Time   Final    GRAM NEGATIVE RODS ANAEROBIC BOTTLE ONLY CRITICAL RESULT CALLED TO, READ BACK BY AND VERIFIED WITH: Robie Ridge [phone] @1957  FH Performed at [facility], [address], [place], [place] [address]    Culture ESCHERICHIA COLI (A)  Final   Report Status 05/13/2023 FINAL  Final   Organism ID, Bacteria ESCHERICHIA COLI  Final   Organism ID, Bacteria ESCHERICHIA COLI  Final       Susceptibility   Escherichia coli - KIRBY BAUER*    CEFAZOLIN SENSITIVE Sensitive    Escherichia coli - MIC*    AMPICILLIN <=2 SENSITIVE Sensitive     CEFTAZIDIME <=1 SENSITIVE Sensitive     CEFTRIAXONE <=0.25 SENSITIVE Sensitive     CIPROFLOXACIN <=0.25 SENSITIVE Sensitive     GENTAMICIN <=1 SENSITIVE Sensitive     IMIPENEM <=0.25 SENSITIVE Sensitive     TRIMETH/SULFA <=20 SENSITIVE Sensitive     AMPICILLIN/SULBACTAM <=2 SENSITIVE Sensitive     * ESCHERICHIA COLI    ESCHERICHIA COLI  Blood Culture ID Panel (Reflexed)  Status: Abnormal   Collection Time: 05/10/23  3:40 AM  Result Value Ref Range Status   Enterococcus faecalis NOT DETECTED NOT DETECTED Final   Enterococcus Faecium NOT DETECTED NOT DETECTED Final   Listeria monocytogenes NOT DETECTED NOT DETECTED Final   Staphylococcus species NOT DETECTED NOT DETECTED Final   Staphylococcus aureus (BCID) NOT DETECTED NOT DETECTED Final   Staphylococcus epidermidis NOT DETECTED NOT DETECTED Final   Staphylococcus lugdunensis NOT DETECTED NOT DETECTED Final   Streptococcus species NOT DETECTED NOT DETECTED Final   Streptococcus agalactiae NOT DETECTED NOT DETECTED Final   Streptococcus pneumoniae NOT DETECTED NOT DETECTED Final   Streptococcus pyogenes NOT DETECTED NOT DETECTED Final   A.calcoaceticus-baumannii NOT DETECTED NOT DETECTED Final   Bacteroides fragilis NOT DETECTED NOT DETECTED Final   Enterobacterales DETECTED (A) NOT DETECTED Final    Comment: Enterobacterales represent a large order of gram negative bacteria, not a single organism. CRITICAL RESULT CALLED TO, READ BACK BY AND VERIFIED WITH: PHARMD AWeston Anna [phone] @1957  FH    Enterobacter cloacae complex NOT DETECTED NOT DETECTED Final   Escherichia coli DETECTED (A) NOT DETECTED Final    Comment: CRITICAL RESULT CALLED TO, READ BACK BY AND VERIFIED WITH: PHARMD AWeston Anna [phone] @1957  FH    Klebsiella aerogenes NOT DETECTED NOT DETECTED Final   Klebsiella  oxytoca NOT DETECTED NOT DETECTED Final   Klebsiella pneumoniae NOT DETECTED NOT DETECTED Final   Proteus species NOT DETECTED NOT DETECTED Final   Salmonella species NOT DETECTED NOT DETECTED Final   Serratia marcescens NOT DETECTED NOT DETECTED Final   Haemophilus influenzae NOT DETECTED NOT DETECTED Final   Neisseria meningitidis NOT DETECTED NOT DETECTED Final   Pseudomonas aeruginosa NOT DETECTED NOT DETECTED Final   Stenotrophomonas maltophilia NOT DETECTED NOT DETECTED Final   Candida albicans NOT DETECTED NOT DETECTED Final   Candida auris NOT DETECTED NOT DETECTED Final   Candida glabrata NOT DETECTED NOT DETECTED Final   Candida krusei NOT DETECTED NOT DETECTED Final   Candida parapsilosis NOT DETECTED NOT DETECTED Final   Candida tropicalis NOT DETECTED NOT DETECTED Final   Cryptococcus neoformans/gattii NOT DETECTED NOT DETECTED Final   CTX-M ESBL NOT DETECTED NOT DETECTED Final   Carbapenem resistance IMP NOT DETECTED NOT DETECTED Final   Carbapenem resistance KPC NOT DETECTED NOT DETECTED Final   Carbapenem resistance NDM NOT DETECTED NOT DETECTED Final   Carbapenem resist OXA 48 LIKE NOT DETECTED NOT DETECTED Final   Carbapenem resistance VIM NOT DETECTED NOT DETECTED Final    Comment: Performed at [facility], [address], [place], [place] [address]  Blood culture (routine x 2)     Status: Abnormal   Collection Time: 05/10/23  3:45 AM   Specimen: BLOOD  Result Value Ref Range Status   Specimen Description   Final    BLOOD RIGHT ANTECUBITAL Performed at [facility], [address], [place], [place] [address]    Special Requests   Final    BOTTLES DRAWN AEROBIC AND ANAEROBIC Blood Culture adequate volume Performed at [facility], [address] [place], [place] [address]    Culture  Setup Time   Final    GRAM NEGATIVE RODS BOTTLES DRAWN AEROBIC ONLY CRITICAL VALUE NOTED.  VALUE IS CONSISTENT WITH PREVIOUSLY  REPORTED AND CALLED VALUE.    Culture (A)  Final    ESCHERICHIA COLI SUSCEPTIBILITIES PERFORMED ON PREVIOUS CULTURE WITHIN THE LAST 5 DAYS. Performed at [facility], [address], [place], [place] [address]    Report Status 05/13/2023 FINAL  Final         Radiology Studies: No results found.       Scheduled Meds:  flecainide  50 mg Oral Daily   metoprolol tartrate  25 mg Oral BID   Continuous Infusions:  cefTRIAXone (ROCEPHIN)  IV Stopped (05/12/23 2204)   heparin 850 Units/hr (05/13/23 0644)     LOS: 3 days    Time spent: 45 minutes spent on chart review, discussion with nursing staff, consultants, updating family and interview/physical exam; more than 50% of that time was spent in counseling and/or coordination of care.    Joseph Art, DO [facility] Available via Epic secure chat 7am-7pm After these hours, please refer to coverage provider listed on [email or web address] 05/13/2023, 12:45 PM

## 2023-05-14 ENCOUNTER — Inpatient Hospital Stay (HOSPITAL_COMMUNITY): Admitting: Anesthesiology

## 2023-05-14 ENCOUNTER — Encounter (HOSPITAL_COMMUNITY): Payer: Self-pay | Admitting: Internal Medicine

## 2023-05-14 ENCOUNTER — Encounter (HOSPITAL_COMMUNITY)
Admission: EM | Disposition: A | Discharge status: Home Health | Payer: Self-pay | Source: Home / Self Care | Attending: Internal Medicine

## 2023-05-14 DIAGNOSIS — I2510 Atherosclerotic heart disease of native coronary artery without angina pectoris: Secondary | ICD-10-CM

## 2023-05-14 DIAGNOSIS — K819 Cholecystitis, unspecified: Secondary | ICD-10-CM | POA: Diagnosis not present

## 2023-05-14 DIAGNOSIS — I10 Essential (primary) hypertension: Secondary | ICD-10-CM

## 2023-05-14 DIAGNOSIS — K851 Biliary acute pancreatitis without necrosis or infection: Secondary | ICD-10-CM | POA: Diagnosis not present

## 2023-05-14 HISTORY — PX: CHOLECYSTECTOMY: SHX55

## 2023-05-14 LAB — CBC
HCT: 34.8 % — ABNORMAL LOW (ref 36.0–46.0)
Hemoglobin: 12 g/dL (ref 12.0–15.0)
MCH: 32.6 pg (ref 26.0–34.0)
MCHC: 34.5 g/dL (ref 30.0–36.0)
MCV: 94.6 fL (ref 80.0–100.0)
Platelets: 288 10*3/uL (ref 150–400)
RBC: 3.68 MIL/uL — ABNORMAL LOW (ref 3.87–5.11)
RDW: 12.9 % (ref 11.5–15.5)
WBC: 10.8 10*3/uL — ABNORMAL HIGH (ref 4.0–10.5)
nRBC: 0 % (ref 0.0–0.2)

## 2023-05-14 LAB — COMPREHENSIVE METABOLIC PANEL WITH GFR
ALT: 110 U/L — ABNORMAL HIGH (ref 0–44)
AST: 48 U/L — ABNORMAL HIGH (ref 15–41)
Albumin: 3.2 g/dL — ABNORMAL LOW (ref 3.5–5.0)
Alkaline Phosphatase: 123 U/L (ref 38–126)
Anion gap: 14 (ref 5–15)
BUN: 9 mg/dL (ref 8–23)
CO2: 19 mmol/L — ABNORMAL LOW (ref 22–32)
Calcium: 8.8 mg/dL — ABNORMAL LOW (ref 8.9–10.3)
Chloride: 102 mmol/L (ref 98–111)
Creatinine, Ser: 0.85 mg/dL (ref 0.44–1.00)
GFR, Estimated: >60 mL/min (ref >60)
Glucose, Bld: 77 mg/dL (ref 70–99)
Potassium: 3.3 mmol/L — ABNORMAL LOW (ref 3.5–5.1)
Sodium: 135 mmol/L (ref 135–145)
Total Bilirubin: 1 mg/dL (ref 0.0–1.2)
Total Protein: 6.5 g/dL (ref 6.5–8.1)

## 2023-05-14 LAB — GLUCOSE, CAPILLARY
Glucose-Capillary: 128 mg/dL — ABNORMAL HIGH (ref 70–99)
Glucose-Capillary: 69 mg/dL — ABNORMAL LOW (ref 70–99)
Glucose-Capillary: 85 mg/dL (ref 70–99)

## 2023-05-14 LAB — HEPARIN LEVEL (UNFRACTIONATED): Heparin Unfractionated: 0.64 [IU]/mL (ref 0.30–0.70)

## 2023-05-14 LAB — APTT: aPTT: 87 s — ABNORMAL HIGH (ref 24–36)

## 2023-05-14 SURGERY — LAPAROSCOPIC CHOLECYSTECTOMY WITH INTRAOPERATIVE CHOLANGIOGRAM
Anesthesia: General

## 2023-05-14 MED ORDER — BUPIVACAINE-EPINEPHRINE (PF) 0.25% -1:200000 IJ SOLN
INTRAMUSCULAR | Status: DC | PRN
  Administered 2023-05-14: 7 mL via PERINEURAL

## 2023-05-14 MED ORDER — POTASSIUM CHLORIDE CRYS ER 20 MEQ PO TBCR
40.0000 meq | EXTENDED_RELEASE_TABLET | Freq: Once | ORAL | Status: AC
  Administered 2023-05-14: 40 meq via ORAL
  Filled 2023-05-14: qty 2

## 2023-05-14 MED ORDER — AMISULPRIDE (ANTIEMETIC) 5 MG/2ML IV SOLN: 10.0000 mg | Freq: Once | INTRAVENOUS | Status: DC | PRN

## 2023-05-14 MED ORDER — EPHEDRINE 5 MG/ML INJ
INTRAVENOUS | Status: AC
Start: 2023-05-14 — End: ?
  Filled 2023-05-14: qty 5

## 2023-05-14 MED ORDER — LACTATED RINGERS IV SOLN
INTRAVENOUS | Status: DC
Start: 2023-05-14 — End: 2023-05-14

## 2023-05-14 MED ORDER — EPHEDRINE SULFATE-NACL 50-0.9 MG/10ML-% IV SOSY
PREFILLED_SYRINGE | INTRAVENOUS | Status: DC | PRN
  Administered 2023-05-14: 10 mg via INTRAVENOUS

## 2023-05-14 MED ORDER — FENTANYL CITRATE PF 50 MCG/ML IJ SOSY
PREFILLED_SYRINGE | INTRAMUSCULAR | Status: AC
  Filled 2023-05-14: qty 2

## 2023-05-14 MED ORDER — FENTANYL CITRATE (PF) 100 MCG/2ML IJ SOLN
INTRAMUSCULAR | Status: DC | PRN
  Administered 2023-05-14 (×4): 25 ug via INTRAVENOUS

## 2023-05-14 MED ORDER — FENTANYL CITRATE PF 50 MCG/ML IJ SOSY
25.0000 ug | PREFILLED_SYRINGE | INTRAMUSCULAR | Status: DC | PRN
  Administered 2023-05-14 (×2): 50 ug via INTRAVENOUS

## 2023-05-14 MED ORDER — SODIUM CHLORIDE 0.9 % IV SOLN
3.0000 g | Freq: Four times a day (QID) | INTRAVENOUS | Status: DC
  Administered 2023-05-14 – 2023-05-17 (×13): 3 g via INTRAVENOUS
  Filled 2023-05-14 (×14): qty 8

## 2023-05-14 MED ORDER — LIDOCAINE 2% (20 MG/ML) 5 ML SYRINGE
INTRAMUSCULAR | Status: DC | PRN
  Administered 2023-05-14: 40 mg via INTRAVENOUS

## 2023-05-14 MED ORDER — LIDOCAINE HCL (PF) 2 % IJ SOLN
INTRAMUSCULAR | Status: AC
  Filled 2023-05-14: qty 5

## 2023-05-14 MED ORDER — PROPOFOL 10 MG/ML IV BOLUS
INTRAVENOUS | Status: DC | PRN
  Administered 2023-05-14: 100 mg via INTRAVENOUS

## 2023-05-14 MED ORDER — HEPARIN (PORCINE) 25000 UT/250ML-% IV SOLN
850.0000 [IU]/h | INTRAVENOUS | Status: DC
  Administered 2023-05-14: 850 [IU]/h via INTRAVENOUS
  Filled 2023-05-14: qty 250

## 2023-05-14 MED ORDER — PROPOFOL 10 MG/ML IV BOLUS
INTRAVENOUS | Status: AC
  Filled 2023-05-14: qty 20

## 2023-05-14 MED ORDER — DEXTROSE 50 % IV SOLN
12.5000 g | INTRAVENOUS | Status: AC
  Administered 2023-05-14: 12.5 g via INTRAVENOUS
  Filled 2023-05-14: qty 50

## 2023-05-14 MED ORDER — 0.9 % SODIUM CHLORIDE (POUR BTL) OPTIME
TOPICAL | Status: DC | PRN
Start: 2023-05-14 — End: 2023-05-14
  Administered 2023-05-14: 1000 mL

## 2023-05-14 MED ORDER — ONDANSETRON HCL 4 MG/2ML IJ SOLN
INTRAMUSCULAR | Status: AC
  Filled 2023-05-14: qty 2

## 2023-05-14 MED ORDER — FENTANYL CITRATE (PF) 100 MCG/2ML IJ SOLN
INTRAMUSCULAR | Status: AC
  Filled 2023-05-14: qty 2

## 2023-05-14 MED ORDER — ROCURONIUM BROMIDE 10 MG/ML (PF) SYRINGE
PREFILLED_SYRINGE | INTRAVENOUS | Status: DC | PRN
  Administered 2023-05-14: 30 mg via INTRAVENOUS

## 2023-05-14 MED ORDER — ONDANSETRON HCL 4 MG/2ML IJ SOLN
INTRAMUSCULAR | Status: DC | PRN
  Administered 2023-05-14: 4 mg via INTRAVENOUS

## 2023-05-14 MED ORDER — ROCURONIUM BROMIDE 10 MG/ML (PF) SYRINGE
PREFILLED_SYRINGE | INTRAVENOUS | Status: AC
  Filled 2023-05-14: qty 10

## 2023-05-14 MED ORDER — SUGAMMADEX SODIUM 200 MG/2ML IV SOLN
INTRAVENOUS | Status: DC | PRN
  Administered 2023-05-14: 150 mg via INTRAVENOUS

## 2023-05-14 MED ORDER — LACTATED RINGERS IV SOLN
INTRAVENOUS | Status: AC | PRN
  Administered 2023-05-14: 1000 mL

## 2023-05-14 MED ORDER — CHLORHEXIDINE GLUCONATE 0.12 % MT SOLN
15.0000 mL | Freq: Once | OROMUCOSAL | Status: AC
  Administered 2023-05-14: 15 mL via OROMUCOSAL

## 2023-05-14 MED ORDER — BUPIVACAINE-EPINEPHRINE (PF) 0.25% -1:200000 IJ SOLN
INTRAMUSCULAR | Status: AC
  Filled 2023-05-14: qty 30

## 2023-05-14 MED ORDER — FENTANYL CITRATE PF 50 MCG/ML IJ SOSY
25.0000 ug | PREFILLED_SYRINGE | INTRAMUSCULAR | Status: DC | PRN
  Administered 2023-05-14 – 2023-05-15 (×3): 25 ug via INTRAVENOUS
  Filled 2023-05-14 (×3): qty 1

## 2023-05-14 SURGICAL SUPPLY — 34 items
APPLIER CLIP 5 13 M/L LIGAMAX5 (MISCELLANEOUS) IMPLANT
BAG COUNTER SPONGE SURGICOUNT (BAG) IMPLANT
CABLE HIGH FREQUENCY MONO STRZ (ELECTRODE) ×2 IMPLANT
CHLORAPREP W/TINT 26 (MISCELLANEOUS) ×2 IMPLANT
CLIP APPLIE 5 13 M/L LIGAMAX5 (MISCELLANEOUS) IMPLANT
CLIP LIGATING HEMO O LOK GREEN (MISCELLANEOUS) ×2 IMPLANT
COVER MAYO STAND XLG (MISCELLANEOUS) IMPLANT
COVER PROBE U/S 5X48 (MISCELLANEOUS) IMPLANT
DERMABOND ADVANCED .7 DNX12 (GAUZE/BANDAGES/DRESSINGS) ×2 IMPLANT
DERMABOND ADVANCED .7 DNX6 (GAUZE/BANDAGES/DRESSINGS) IMPLANT
DRAPE C-ARM 42X120 X-RAY (DRAPES) IMPLANT
ELECT REM PT RETURN 15FT ADLT (MISCELLANEOUS) ×2 IMPLANT
GAUZE SPONGE 2X2 8PLY STRL LF (GAUZE/BANDAGES/DRESSINGS) ×2 IMPLANT
GLOVE BIO SURGEON STRL SZ7.5 (GLOVE) ×2 IMPLANT
GOWN STRL REUS W/ TWL XL LVL3 (GOWN DISPOSABLE) ×2 IMPLANT
GRASPER SUT TROCAR 14GX15 (MISCELLANEOUS) IMPLANT
IRRIG SUCT STRYKERFLOW 2 WTIP (MISCELLANEOUS) ×1 IMPLANT
IRRIGATION SUCT STRKRFLW 2 WTP (MISCELLANEOUS) ×2 IMPLANT
KIT BASIN OR (CUSTOM PROCEDURE TRAY) ×2 IMPLANT
KIT TURNOVER KIT A (KITS) IMPLANT
NDL INSUFFLATION 14GA 120MM (NEEDLE) ×2 IMPLANT
NEEDLE INSUFFLATION 14GA 120MM (NEEDLE) ×1 IMPLANT
PENCIL SMOKE EVACUATOR (MISCELLANEOUS) IMPLANT
POUCH RETRIEVAL ECOSAC 10 (ENDOMECHANICALS) IMPLANT
SCISSORS LAP 5X35 DISP (ENDOMECHANICALS) ×2 IMPLANT
SET CHOLANGIOGRAPH MIX (MISCELLANEOUS) IMPLANT
SET TUBE SMOKE EVAC HIGH FLOW (TUBING) ×2 IMPLANT
SLEEVE Z-THREAD 5X100MM (TROCAR) ×2 IMPLANT
SPIKE FLUID TRANSFER (MISCELLANEOUS) ×2 IMPLANT
SUT MNCRL AB 4-0 PS2 18 (SUTURE) ×2 IMPLANT
TOWEL OR 17X26 10 PK STRL BLUE (TOWEL DISPOSABLE) ×2 IMPLANT
TRAY LAPAROSCOPIC (CUSTOM PROCEDURE TRAY) ×2 IMPLANT
TROCAR 11X100 Z THREAD (TROCAR) ×2 IMPLANT
TROCAR Z-THREAD OPTICAL 5X100M (TROCAR) ×2 IMPLANT

## 2023-05-14 NOTE — Progress Notes (Signed)
 PROGRESS NOTE    Cheryl [name]  ZOX:[id number] DOB: 1934-11-06 DOA: 05/09/2023 PCP: Laurann Montana, MD    Brief Narrative:  Cheryl [name] is a 89 y.o. female with history of paroxysmal atrial fibrillation, hypertension, chronic kidney disease stage III, hyperlipidemia presents to the ER with complaints of abdominal pain.  Patient's pain started last evening around 4 PM.  Had multiple episodes of vomiting.  Pain is mostly in the epigastric area nonradiating.  Patient initially tried some Gas-X with no relief decided to come to the ER.  MRCP not able to be done due to implanted device.  GI and GS consulted for recommendations.  Going for cholecystectomy on 4/7   Assessment and Plan: Acute pancreatitis with markedly elevated LFTs  - suspect gallstone pancreatitis.  Mildly dilated common bile duct.  No definite gallstones seen on CAT scan or ultrasound.   - Dr. Dulce Sellar gastroenterology has been consulted.   -unable to have MRI due to spinal cord stimulator- note from 10/19: MRI talked with [facility] regarding spinal stimulator and was advised that the wires and generator is not safe to undergo a MRI.  -GS consult for cholecystectomy + IOC 4/7  + blood culture/bacteremia- e coli IV abx  -plan for GB removal on 4/7    Paroxysmal atrial fibrillation  -takes Tambocor and metoprolol.  - Hold Eliquis:  last dose was 4/2 AM -on IV heparin   Prior history of CVA  -presently on heparin infusion in place of eliquis -hold statin   Hypertension  -hold amlodipine.   -Continue metoprolol   Chronic kidney disease stage III  -stable, follow labs    DVT prophylaxis:     Code Status: Full Code  Anticipate d.c on 4/9 when son is able to stay with patient [facility] after surgery   Disposition Plan:  Level of care: Med-Surg Status is: Inpatient     Consultants:  GS GI    Subjective: Son will be able to stay with her on 4/9   Objective: Vitals:   05/14/23 0334 05/14/23  0902 05/14/23 1051 05/14/23 1055  BP: (!) 164/88 (!) 162/84  (!) 160/83  Pulse: 79 96  85  Resp: 19 14    Temp: 97.9 F (36.6 C) 98.6 F (37 C)  97.9 F (36.6 C)  TempSrc: Oral Oral  Oral  SpO2: 98% 100%  97%  Weight:   62.1 kg   Height:   5\' 3"  (1.6 m)     Intake/Output Summary (Last 24 hours) at 05/14/2023 1149 Last data filed at 05/14/2023 0805 Gross per 24 hour  Intake 1400.84 ml  Output --  Net 1400.84 ml   Filed Weights   05/11/23 0559 05/14/23 1051  Weight: 62.1 kg 62.1 kg    Examination:    General: Appearance:    Well developed, well nourished female in no acute distress     Lungs:     respirations unlabored  Heart:    Normal heart rate.   MS:   All extremities are intact.   Neurologic:   Awake, alert      Data Reviewed: I have personally reviewed following labs and imaging studies  CBC: Recent Labs  Lab 05/10/23 0115 05/10/23 0713 05/11/23 0514 05/12/23 0524 05/13/23 0056 05/14/23 0445  WBC 19.0* 20.0* 20.6* 16.3* 12.6* 10.8*  NEUTROABS 17.6* 18.3*  --   --   --   --   HGB 12.2 12.6 12.0 11.7* 10.9* 12.0  HCT 35.1* 36.6 34.9* 35.1* 31.2* 34.8*  MCV 94.1 96.3 95.9 97.0 95.7 94.6  PLT 275 273 260 240 213 288   Basic Metabolic Panel: Recent Labs  Lab 05/10/23 0713 05/11/23 0514 05/12/23 0524 05/13/23 0056 05/14/23 0445  NA 134* 129* 130* 131* 135  K 3.5 3.4* 3.4* 3.5 3.3*  CL 99 95* 98 102 102  CO2 25 21* 20* 17* 19*  GLUCOSE 127* 103* 89 90 77  BUN 19 16 9 9 9   CREATININE 1.20* 0.85 0.83 0.75 0.85  CALCIUM 9.4 9.0 8.8* 8.2* 8.8*   GFR: Estimated Creatinine Clearance: 39.3 mL/min (by C-G formula based on SCr of 0.85 mg/dL). Liver Function Tests: Recent Labs  Lab 05/10/23 0713 05/11/23 0514 05/12/23 0524 05/13/23 0056 05/14/23 0445  AST 1,150* 407* 165* 73* 48*  ALT 505* 310* 207* 136* 110*  ALKPHOS 190* 151* 136* 117 123  BILITOT 1.7* 1.0 1.0 0.9 1.0  PROT 5.9* 6.4* 6.4* 5.5* 6.5  ALBUMIN 3.1* 3.3* 3.1* 2.7* 3.2*   Recent  Labs  Lab 05/10/23 0115 05/11/23 0514 05/12/23 0524  LIPASE 1,066* 729* 192*   No results for input(s): "AMMONIA" in the last 168 hours. Coagulation Profile: No results for input(s): "INR", "PROTIME" in the last 168 hours. Cardiac Enzymes: No results for input(s): "CKTOTAL", "CKMB", "CKMBINDEX", "TROPONINI" in the last 168 hours. BNP (last 3 results) No results for input(s): "PROBNP" in the last 8760 hours. HbA1C: No results for input(s): "HGBA1C" in the last 72 hours. CBG: Recent Labs  Lab 05/13/23 0546 05/13/23 1159 05/14/23 0004 05/14/23 0605 05/14/23 0639  GLUCAP 72 74 85 69* 128*   Lipid Profile: No results for input(s): "CHOL", "HDL", "LDLCALC", "TRIG", "CHOLHDL", "LDLDIRECT" in the last 72 hours.  Thyroid Function Tests: No results for input(s): "TSH", "T4TOTAL", "FREET4", "T3FREE", "THYROIDAB" in the last 72 hours. Anemia Panel: No results for input(s): "VITAMINB12", "FOLATE", "FERRITIN", "TIBC", "IRON", "RETICCTPCT" in the last 72 hours. Sepsis Labs: Recent Labs  Lab 05/10/23 0115 05/10/23 0340 05/10/23 0713 05/10/23 0906  LATICACIDVEN 1.5 2.2* 1.6 1.3    Recent Results (from the past 240 hours)  Blood culture (routine x 2)     Status: Abnormal   Collection Time: 05/10/23  3:40 AM   Specimen: BLOOD  Result Value Ref Range Status   Specimen Description   Final    BLOOD LEFT ANTECUBITAL Performed at [facility], [address], [place], [place] [address]    Special Requests   Final    BOTTLES DRAWN AEROBIC AND ANAEROBIC Blood Culture adequate volume Performed at [facility], [address], [place], [place] [address]    Culture  Setup Time   Final    GRAM NEGATIVE RODS ANAEROBIC BOTTLE ONLY CRITICAL RESULT CALLED TO, READ BACK BY AND VERIFIED WITH: Robie Ridge [phone] @1957  FH Performed at [facility], [address] [place], [place] [address]    Culture ESCHERICHIA COLI (A)  Final   Report  Status 05/13/2023 FINAL  Final   Organism ID, Bacteria ESCHERICHIA COLI  Final   Organism ID, Bacteria ESCHERICHIA COLI  Final      Susceptibility   Escherichia coli - KIRBY BAUER*    CEFAZOLIN SENSITIVE Sensitive    Escherichia coli - MIC*    AMPICILLIN <=2 SENSITIVE Sensitive     CEFTAZIDIME <=1 SENSITIVE Sensitive     CEFTRIAXONE <=0.25 SENSITIVE Sensitive     CIPROFLOXACIN <=0.25 SENSITIVE Sensitive     GENTAMICIN <=1 SENSITIVE Sensitive     IMIPENEM <=0.25 SENSITIVE  Sensitive     TRIMETH/SULFA <=20 SENSITIVE Sensitive     AMPICILLIN/SULBACTAM <=2 SENSITIVE Sensitive     * ESCHERICHIA COLI    ESCHERICHIA COLI  Blood Culture ID Panel (Reflexed)     Status: Abnormal   Collection Time: 05/10/23  3:40 AM  Result Value Ref Range Status   Enterococcus faecalis NOT DETECTED NOT DETECTED Final   Enterococcus Faecium NOT DETECTED NOT DETECTED Final   Listeria monocytogenes NOT DETECTED NOT DETECTED Final   Staphylococcus species NOT DETECTED NOT DETECTED Final   Staphylococcus aureus (BCID) NOT DETECTED NOT DETECTED Final   Staphylococcus epidermidis NOT DETECTED NOT DETECTED Final   Staphylococcus lugdunensis NOT DETECTED NOT DETECTED Final   Streptococcus species NOT DETECTED NOT DETECTED Final   Streptococcus agalactiae NOT DETECTED NOT DETECTED Final   Streptococcus pneumoniae NOT DETECTED NOT DETECTED Final   Streptococcus pyogenes NOT DETECTED NOT DETECTED Final   A.calcoaceticus-baumannii NOT DETECTED NOT DETECTED Final   Bacteroides fragilis NOT DETECTED NOT DETECTED Final   Enterobacterales DETECTED (A) NOT DETECTED Final    Comment: Enterobacterales represent a large order of gram negative bacteria, not a single organism. CRITICAL RESULT CALLED TO, READ BACK BY AND VERIFIED WITH: PHARMD A. Weston Anna [phone] @1957  FH    Enterobacter cloacae complex NOT DETECTED NOT DETECTED Final   Escherichia coli DETECTED (A) NOT DETECTED Final    Comment: CRITICAL RESULT CALLED TO, READ  BACK BY AND VERIFIED WITH: PHARMD AWeston Anna [phone] @1957  FH    Klebsiella aerogenes NOT DETECTED NOT DETECTED Final   Klebsiella oxytoca NOT DETECTED NOT DETECTED Final   Klebsiella pneumoniae NOT DETECTED NOT DETECTED Final   Proteus species NOT DETECTED NOT DETECTED Final   Salmonella species NOT DETECTED NOT DETECTED Final   Serratia marcescens NOT DETECTED NOT DETECTED Final   Haemophilus influenzae NOT DETECTED NOT DETECTED Final   Neisseria meningitidis NOT DETECTED NOT DETECTED Final   Pseudomonas aeruginosa NOT DETECTED NOT DETECTED Final   Stenotrophomonas maltophilia NOT DETECTED NOT DETECTED Final   Candida albicans NOT DETECTED NOT DETECTED Final   Candida auris NOT DETECTED NOT DETECTED Final   Candida glabrata NOT DETECTED NOT DETECTED Final   Candida krusei NOT DETECTED NOT DETECTED Final   Candida parapsilosis NOT DETECTED NOT DETECTED Final   Candida tropicalis NOT DETECTED NOT DETECTED Final   Cryptococcus neoformans/gattii NOT DETECTED NOT DETECTED Final   CTX-M ESBL NOT DETECTED NOT DETECTED Final   Carbapenem resistance IMP NOT DETECTED NOT DETECTED Final   Carbapenem resistance KPC NOT DETECTED NOT DETECTED Final   Carbapenem resistance NDM NOT DETECTED NOT DETECTED Final   Carbapenem resist OXA 48 LIKE NOT DETECTED NOT DETECTED Final   Carbapenem resistance VIM NOT DETECTED NOT DETECTED Final    Comment: Performed at [facility], [address], [place], [place] [address]  Blood culture (routine x 2)     Status: Abnormal   Collection Time: 05/10/23  3:45 AM   Specimen: BLOOD  Result Value Ref Range Status   Specimen Description   Final    BLOOD RIGHT ANTECUBITAL Performed at [facility], [address] [place], [place] [address]    Special Requests   Final    BOTTLES DRAWN AEROBIC AND ANAEROBIC Blood Culture adequate volume Performed at [facility], [address], [place], [place] [address]    Culture   Setup Time   Final    GRAM NEGATIVE RODS BOTTLES DRAWN AEROBIC ONLY CRITICAL VALUE NOTED.  VALUE IS CONSISTENT WITH PREVIOUSLY REPORTED AND CALLED VALUE.    Culture (A)  Final    ESCHERICHIA COLI SUSCEPTIBILITIES PERFORMED ON PREVIOUS CULTURE WITHIN THE LAST 5 DAYS. Performed at [facility], [address] [place], [place] [address]    Report Status 05/13/2023 FINAL  Final         Radiology Studies: No results found.       Scheduled Meds:  [MAR Hold] flecainide  50 mg Oral Daily   [MAR Hold] metoprolol tartrate  25 mg Oral BID   Continuous Infusions:  [MAR Hold] ampicillin-sulbactam (UNASYN) IV 3 g (05/14/23 1036)   lactated ringers 10 mL/hr at 05/14/23 1104     LOS: 4 days    Time spent: 45 minutes spent on chart review, discussion with nursing staff, consultants, updating family and interview/physical exam; more than 50% of that time was spent in counseling and/or coordination of care.    Joseph Art, DO [facility] Available via Epic secure chat 7am-7pm After these hours, please refer to coverage provider listed on [email or web address] 05/14/2023, 11:49 AM

## 2023-05-14 NOTE — Anesthesia Preprocedure Evaluation (Signed)
 Anesthesia Evaluation  Patient identified by MRN, date of birth, ID band Patient awake    Reviewed: Allergy & Precautions, NPO status , Patient's Chart, lab work & pertinent test results  Airway Mallampati: II  TM Distance: >3 FB Neck ROM: Full    Dental  (+) Dental Advisory Given   Pulmonary neg pulmonary ROS   breath sounds clear to auscultation       Cardiovascular hypertension, Pt. on medications + CAD   Rhythm:Regular Rate:Normal     Neuro/Psych  Neuromuscular disease CVA    GI/Hepatic Neg liver ROS, hiatal hernia,GERD  ,,  Endo/Other  negative endocrine ROS    Renal/GU Renal disease     Musculoskeletal  (+) Arthritis ,    Abdominal   Peds  Hematology negative hematology ROS (+)   Anesthesia Other Findings   Reproductive/Obstetrics                             Anesthesia Physical Anesthesia Plan  ASA: 3  Anesthesia Plan: General   Post-op Pain Management: Tylenol PO (pre-op)*   Induction: Intravenous  PONV Risk Score and Plan: 3 and Dexamethasone, Ondansetron and Treatment may vary due to age or medical condition  Airway Management Planned: Oral ETT  Additional Equipment:   Intra-op Plan:   Post-operative Plan: Extubation in OR  Informed Consent: I have reviewed the patients History and Physical, chart, labs and discussed the procedure including the risks, benefits and alternatives for the proposed anesthesia with the patient or authorized representative who has indicated his/her understanding and acceptance.     Dental advisory given  Plan Discussed with: CRNA  Anesthesia Plan Comments:        Anesthesia Quick Evaluation

## 2023-05-14 NOTE — Anesthesia Postprocedure Evaluation (Signed)
 Anesthesia Post Note  Patient: Cheryl [name]  Procedure(s) Performed: LAPAROSCOPIC CHOLECYSTECTOMY     Patient location during evaluation: PACU Anesthesia Type: General Level of consciousness: awake and alert Pain management: pain level controlled Vital Signs Assessment: post-procedure vital signs reviewed and stable Respiratory status: spontaneous breathing, nonlabored ventilation, respiratory function stable and patient connected to nasal cannula oxygen Cardiovascular status: blood pressure returned to baseline and stable Postop Assessment: no apparent nausea or vomiting Anesthetic complications: no   No notable events documented.  Last Vitals:  Vitals:   05/14/23 1345 05/14/23 1408  BP: (!) 152/68 (!) 151/78  Pulse: 71 69  Resp: 16 20  Temp: (!) 36.3 C (!) 36.4 C  SpO2: 100% 100%    Last Pain:  Vitals:   05/14/23 1450  TempSrc:   PainSc: 10-Worst pain ever                 Nikoleta Dady

## 2023-05-14 NOTE — Op Note (Signed)
 05/14/2023  12:46 PM  PATIENT:  Cheryl [name]  89 y.o. female  PRE-OPERATIVE DIAGNOSIS:  cholecystitis  POST-OPERATIVE DIAGNOSIS:  cholecystitis  PROCEDURE:  Procedure(s) with comments: LAPAROSCOPIC CHOLECYSTECTOMY (N/A)   SURGEON:  Surgeons and Role:    Axel Filler, MD - Primary  ASSISTANTS: none   ANESTHESIA:   local and general  EBL:  10 mL   BLOOD ADMINISTERED:none  DRAINS: none   LOCAL MEDICATIONS USED:  BUPIVICAINE   SPECIMEN:  Source of Specimen:  gallbladder  DISPOSITION OF SPECIMEN:  PATHOLOGY  COUNTS:  YES  TOURNIQUET:  * No tourniquets in log *  DICTATION: .Dragon Dictation  EBL: <5cc   Complications: none   Counts: reported as correct x 2   Findings:chronic inflammation of gallbladder patient with 4 ducts of loose skin that were clipped using plastic clips and a Hem-o-lok .  Indications for procedure: Pt is a 30F with biliary pancreatitis.  Patient with cholelithiasis.  Patient taken back to the operating for lap chole.   Details of the procedure: The patient was taken to the operating and placed in the supine position with bilateral SCDs in place. A time out was called and all facts were verified. A pneumoperitoneum was obtained via A Veress needle technique to a pressure of 14mm of mercury. A 5mm trochar was then placed in the right upper quadrant under visualization, and there were no injuries to any abdominal organs. A 11 mm port was then placed in the umbilical region after infiltrating with local anesthesia under direct visualization. A second epigastric port was placed under direct visualization.   The gallbladder was identified and retracted, the peritoneum was then sharply dissected from the gallbladder and this dissection was carried down to Calot's triangle. The cystic duct was identified and dissected circumferentially and seen going into the gallbladder 360.  The cystic artery was dissected away from the surrounding tissues.   The  critical angle was obtained.   2 clips were placed proximally one distally and the cystic duct transected. The cystic artery was identified and 2 clips placed proximally and one distally and transected. We then proceeded to remove the gallbladder off the hepatic fossa with Bovie cautery. A retrieval bag was then placed in the abdomen and gallbladder placed in the bag. The hepatic fossa was then reexamined and hemostasis was achieved with Bovie cautery and was excellent at this portion of the case. The subhepatic fossa and perihepatic fossa was then irrigated until the effluent was clear.  Upon visualizing the hepatic surface with the gallbladder was moved there was 4 individual ducts of Luschka that could be visualized.  These were individually clipped using hemoclips.  The specimen bag and specimen were removed from the abdominal cavity.  The 11 mm trocar fascia was reapproximated with the Endo Close #1 Vicryl x1. The pneumoperitoneum was evacuated and all trochars removed under direct visulalization. The skin was then closed with 4-0 Monocryl and the skin dressed with Dermabond. The patient was awaken from general anesthesia and taken to the recovery room in stable condition.    PLAN OF CARE: Admit to inpatient   PATIENT DISPOSITION:  PACU - hemodynamically stable.   Delay start of Pharmacological VTE agent (>24hrs) due to surgical blood loss or risk of bleeding: not applicable

## 2023-05-14 NOTE — Anesthesia Procedure Notes (Signed)
 Procedure Name: Intubation Date/Time: 05/14/2023 12:12 PM  Performed by: Lezlie Lye, CRNAPre-anesthesia Checklist: Patient identified, Emergency Drugs available, Suction available and Patient being monitored Patient Re-evaluated:Patient Re-evaluated prior to induction Oxygen Delivery Method: Circle system utilized Preoxygenation: Pre-oxygenation with 100% oxygen Induction Type: IV induction Ventilation: Mask ventilation without difficulty Laryngoscope Size: Miller and 3 Grade View: Grade I Tube type: Oral Tube size: 7.0 mm Number of attempts: 1 Airway Equipment and Method: Stylet Placement Confirmation: ETT inserted through vocal cords under direct vision, positive ETCO2 and breath sounds checked- equal and bilateral Secured at: 21 cm Tube secured with: Tape Dental Injury: Teeth and Oropharynx as per pre-operative assessment

## 2023-05-14 NOTE — Progress Notes (Signed)
 PT Cancellation Note  Patient Details Name: CORNELIOUS DIVEN MRN: [id number] DOB: 1934/06/14   Cancelled Treatment:    Reason Eval/Treat Not Completed: Patient at procedure or test/unavailable    Faye Ramsay, PT Acute Rehabilitation  Office: [phone]

## 2023-05-14 NOTE — Progress Notes (Signed)
*   Day of Surgery *   Subjective/Chief Complaint: Doing well   Objective: Vital signs in last 24 hours: Temp:  [97.9 F (36.6 C)-99 F (37.2 C)] 97.9 F (36.6 C) (04/07 1055) Pulse Rate:  [79-96] 85 (04/07 1055) Resp:  [14-19] 14 (04/07 0902) BP: (145-164)/(71-88) 160/83 (04/07 1055) SpO2:  [97 %-100 %] 97 % (04/07 1055) Weight:  [62.1 kg] 62.1 kg (04/07 1051) Last BM Date : 05/12/23  Intake/Output from previous day: 04/06 0701 - 04/07 0700 In: 1670.8 [P.O.:840; I.V.:520.8; IV Piggyback:310] Out: -  Intake/Output this shift: Total I/O In: 60.1 [I.V.:60.1] Out: -   PE:  Constitutional: No acute distress, conversant, appears states age. Eyes: Anicteric sclerae, moist conjunctiva, no lid lag Lungs: Clear to auscultation bilaterally, normal respiratory effort CV: regular rate and rhythm, no murmurs, no peripheral edema, pedal pulses 2+ GI: Soft, no masses or hepatosplenomegaly, non-tender to palpation Skin: No rashes, palpation reveals normal turgor Psychiatric: appropriate judgment and insight, oriented to person, place, and time  Lab Results:  Recent Labs    05/13/23 0056 05/14/23 0445  WBC 12.6* 10.8*  HGB 10.9* 12.0  HCT 31.2* 34.8*  PLT 213 288   BMET Recent Labs    05/13/23 0056 05/14/23 0445  NA 131* 135  K 3.5 3.3*  CL 102 102  CO2 17* 19*  GLUCOSE 90 77  BUN 9 9  CREATININE 0.75 0.85  CALCIUM 8.2* 8.8*   PT/INR No results for input(s): "LABPROT", "INR" in the last 72 hours. ABG No results for input(s): "PHART", "HCO3" in the last 72 hours.  Invalid input(s): "PCO2", "PO2"  Studies/Results: No results found.  Anti-infectives: Anti-infectives (From admission, onward)    Start     Dose/Rate Route Frequency Ordered Stop   05/14/23 1100  [MAR Hold]  Ampicillin-Sulbactam (UNASYN) 3 g in sodium chloride 0.9 % 100 mL IVPB        (MAR Hold since Mon 05/14/2023 at 1045.Hold Reason: Transfer to a Procedural area)   3 g 200 mL/hr over 30 Minutes  Intravenous Every 6 hours 05/14/23 0940     05/10/23 2200  cefTRIAXone (ROCEPHIN) 2 g in sodium chloride 0.9 % 100 mL IVPB  Status:  Discontinued        2 g 200 mL/hr over 30 Minutes Intravenous Every 24 hours 05/10/23 2035 05/14/23 0940   05/10/23 0245  piperacillin-tazobactam (ZOSYN) IVPB 3.375 g        3.375 g 100 mL/hr over 30 Minutes Intravenous  Once 05/10/23 0240 05/10/23 0436       Assessment/Plan: 66F with gallstones To OR today for lap chole All risks and benefits were discussed with the patient to generally include: infection, bleeding, possible need for post op ERCP, damage to the bile ducts, and bile leak. Alternatives were offered and described.  All questions were answered and the patient voiced understanding of the procedure and wishes to proceed at this point with a laparoscopic cholecystectomy   LOS: 4 days    Axel Filler 05/14/2023

## 2023-05-14 NOTE — OR Nursing (Signed)
 Patient arrived to short stay wearing wedding bands. Rings were removed and placed in denture cup with patient label. Rings were returned to patients room by Eunice Blase, NT.

## 2023-05-14 NOTE — Progress Notes (Addendum)
 PHARMACY - ANTICOAGULATION CONSULT NOTE  Pharmacy Consult for IV Heparin (PTA apixaban) Indication: atrial fibrillation  Allergies  Allergen Reactions   Cortisone Nausea And Vomiting and Other (See Comments)    "Made her deathly ill- had to be hospitalized due to sodium being so low from vomiting."   Codeine Phosphate Other (See Comments)    Headaches   Lipitor [Atorvastatin Calcium] Other (See Comments)    Muscle pain and tiredness   Sulfa Antibiotics Other (See Comments)    Headaches     Patient Measurements: Height: 5\' 3"  (160 cm) Weight: 62.1 kg (136 lb 14.5 oz) IBW/kg (Calculated) : 52.4 HEPARIN DW (KG): 62.1 Wt 66kg from Nov 2024  Vital Signs: Temp: 97.9 F (36.6 C) (04/07 0334) Temp Source: Oral (04/07 0334) BP: 164/88 (04/07 0334) Pulse Rate: 79 (04/07 0334)  Labs: Recent Labs    05/12/23 0524 05/12/23 1448 05/13/23 0056 05/14/23 0445  HGB 11.7*  --  10.9* 12.0  HCT 35.1*  --  31.2* 34.8*  PLT 240  --  213 288  APTT 59* 68* 90* 87*  HEPARINUNFRC >1.10*  --  0.88* 0.64  CREATININE 0.83  --  0.75 0.85    Estimated Creatinine Clearance: 39.3 mL/min (by C-G formula based on SCr of 0.85 mg/dL).   Medical History: Past Medical History:  Diagnosis Date   Arthritis    CAD (coronary artery disease)    Mild per remote cath in 2004; normal nuclear in 2012   Carotid artery disease (HCC)    Chronic kidney disease    stage3 kidney disease    Complication of anesthesia    slow to wake up   Diastolic dysfunction    Grade I, per echo in 2012   GERD (gastroesophageal reflux disease)    prevacid   H/O hiatal hernia    Headache(784.0)    sinus   High risk medication use    Flecainide therapy   History of heartburn    Hyperlipidemia    Hypertension    LVH (left ventricular hypertrophy)    Per echo in July 2012   Normal nuclear stress test 08/07/2010   Obesity    PAF (paroxysmal atrial fibrillation) (HCC)    Pneumonia    20 yrs ago   Syncope  08/07/2010    Medications:  Scheduled:   flecainide  50 mg Oral Daily   metoprolol tartrate  25 mg Oral BID   Infusions:   cefTRIAXone (ROCEPHIN)  IV Stopped (05/13/23 2155)   heparin 850 Units/hr (05/14/23 0100)    Assessment: 89 yo admitted with acute pancreatitis on apixaban prior to admission for Afib. Per MD, to change to IV heparin. Last apixaban dose was 4/2 morning.  Today, 05/14/2023 - aPTT 87, therapeutic on heparin 850 units/hr - Heparin level 0.64, appears to be correlating with aPTT - CBC:  Hgb 12, Plt WNL  - No bleeding per RN  Goal of Therapy:  Heparin level 0.3-0.7 units/ml aPTT 66-102 seconds Monitor platelets by anticoagulation protocol: Yes   Plan:  - Continue heparin drip at 850 units/hr - Daily aPTT, HL  - Daily CBC - Noted plan for lap chole; surgery will need to determine heparin hold time once procedure time known.  Lynann Beaver PharmD, BCPS [facility] main pharmacy [phone] 05/14/2023 7:17 AM    Addendum 05/14/2023, 2:19 PM  Heparin off at 8am on 4/7 for surgery.   Per MD consult, ok to resume at 18:00 today. Plan: At 18:00, resume heparin drip at 850  units/hr - No bolus when restarting on POD0  - Heparin level 8 hours after resumed - Daily HL and CBC   Lynann Beaver PharmD, BCPS [facility] [phone] 05/14/2023 2:19 PM

## 2023-05-14 NOTE — Transfer of Care (Signed)
 Immediate Anesthesia Transfer of Care Note  Patient: Cheryl [name]  Procedure(s) Performed: LAPAROSCOPIC CHOLECYSTECTOMY  Patient Location: PACU  Anesthesia Type:General  Level of Consciousness: awake and patient cooperative  Airway & Oxygen Therapy: Patient Spontanous Breathing and Patient connected to face mask oxygen  Post-op Assessment: Report given to RN and Post -op Vital signs reviewed and stable  Post vital signs: Reviewed and stable  Last Vitals:  Vitals Value Taken Time  BP 159/76 05/14/23 1300  Temp 36.6 C 05/14/23 1255  Pulse 72 05/14/23 1302  Resp 18 05/14/23 1302  SpO2 100 % 05/14/23 1302  Vitals shown include unfiled device data.  Last Pain:  Vitals:   05/14/23 1255  TempSrc:   PainSc: Asleep         Complications: No notable events documented.

## 2023-05-15 ENCOUNTER — Encounter (HOSPITAL_COMMUNITY): Payer: Self-pay | Admitting: General Surgery

## 2023-05-15 ENCOUNTER — Other Ambulatory Visit: Payer: Self-pay

## 2023-05-15 DIAGNOSIS — K851 Biliary acute pancreatitis without necrosis or infection: Secondary | ICD-10-CM | POA: Diagnosis not present

## 2023-05-15 DIAGNOSIS — R7881 Bacteremia: Secondary | ICD-10-CM

## 2023-05-15 LAB — HEPARIN LEVEL (UNFRACTIONATED): Heparin Unfractionated: 0.47 [IU]/mL (ref 0.30–0.70)

## 2023-05-15 LAB — CBC
HCT: 32 % — ABNORMAL LOW (ref 36.0–46.0)
Hemoglobin: 11.2 g/dL — ABNORMAL LOW (ref 12.0–15.0)
MCH: 32.3 pg (ref 26.0–34.0)
MCHC: 35 g/dL (ref 30.0–36.0)
MCV: 92.2 fL (ref 80.0–100.0)
Platelets: 294 10*3/uL (ref 150–400)
RBC: 3.47 MIL/uL — ABNORMAL LOW (ref 3.87–5.11)
RDW: 13.2 % (ref 11.5–15.5)
WBC: 12 10*3/uL — ABNORMAL HIGH (ref 4.0–10.5)
nRBC: 0 % (ref 0.0–0.2)

## 2023-05-15 LAB — SURGICAL PATHOLOGY

## 2023-05-15 MED ORDER — APIXABAN 5 MG PO TABS
5.0000 mg | ORAL_TABLET | Freq: Two times a day (BID) | ORAL | Status: DC
  Administered 2023-05-15 – 2023-05-17 (×5): 5 mg via ORAL
  Filled 2023-05-15 (×5): qty 1

## 2023-05-15 MED ORDER — ACETAMINOPHEN 500 MG PO TABS: 1000.0000 mg | ORAL_TABLET | Freq: Four times a day (QID) | ORAL | Status: AC | PRN

## 2023-05-15 MED ORDER — SODIUM CHLORIDE 0.9 % IV SOLN: INTRAVENOUS | Status: AC

## 2023-05-15 MED ORDER — ACETAMINOPHEN 500 MG PO TABS
1000.0000 mg | ORAL_TABLET | Freq: Four times a day (QID) | ORAL | Status: DC
  Administered 2023-05-15 – 2023-05-16 (×4): 1000 mg via ORAL
  Filled 2023-05-15 (×5): qty 2

## 2023-05-15 MED ORDER — TRAMADOL HCL 50 MG PO TABS: 50.0000 mg | ORAL_TABLET | Freq: Four times a day (QID) | ORAL | 0 refills | Status: AC | PRN

## 2023-05-15 MED ORDER — ONDANSETRON HCL 4 MG/2ML IJ SOLN
4.0000 mg | Freq: Four times a day (QID) | INTRAMUSCULAR | Status: DC | PRN
  Administered 2023-05-15: 4 mg via INTRAVENOUS
  Filled 2023-05-15 (×2): qty 2

## 2023-05-15 MED ORDER — TRAMADOL HCL 50 MG PO TABS
50.0000 mg | ORAL_TABLET | Freq: Four times a day (QID) | ORAL | Status: DC | PRN
  Administered 2023-05-15 – 2023-05-16 (×2): 50 mg via ORAL
  Filled 2023-05-15 (×2): qty 1

## 2023-05-15 MED ORDER — MELATONIN 5 MG PO TABS
5.0000 mg | ORAL_TABLET | Freq: Once | ORAL | Status: AC
  Administered 2023-05-15: 5 mg via ORAL
  Filled 2023-05-15: qty 1

## 2023-05-15 NOTE — Progress Notes (Signed)
 PHARMACY - ANTICOAGULATION CONSULT NOTE  Pharmacy Consult for IV Heparin (PTA apixaban) Indication: atrial fibrillation  Allergies  Allergen Reactions   Cortisone Nausea And Vomiting and Other (See Comments)    "Made her deathly ill- had to be hospitalized due to sodium being so low from vomiting."   Codeine Phosphate Other (See Comments)    Headaches   Lipitor [Atorvastatin Calcium] Other (See Comments)    Muscle pain and tiredness   Sulfa Antibiotics Other (See Comments)    Headaches     Patient Measurements: Height: 5\' 3"  (160 cm) Weight: 62.1 kg (136 lb 14.5 oz) IBW/kg (Calculated) : 52.4 HEPARIN DW (KG): 62.1 Wt 66kg from Nov 2024  Vital Signs: Temp: 98.5 F (36.9 C) (04/08 0234) Temp Source: Oral (04/07 1700) BP: 157/71 (04/08 0234) Pulse Rate: 75 (04/08 0234)  Labs: Recent Labs    05/12/23 0524 05/12/23 1448 05/13/23 0056 05/14/23 0445 05/15/23 0216  HGB 11.7*  --  10.9* 12.0 11.2*  HCT 35.1*  --  31.2* 34.8* 32.0*  PLT 240  --  213 288 294  APTT 59* 68* 90* 87*  --   HEPARINUNFRC >1.10*  --  0.88* 0.64 0.47  CREATININE 0.83  --  0.75 0.85  --     Estimated Creatinine Clearance: 39.3 mL/min (by C-G formula based on SCr of 0.85 mg/dL).   Medical History: Past Medical History:  Diagnosis Date   Arthritis    CAD (coronary artery disease)    Mild per remote cath in 2004; normal nuclear in 2012   Carotid artery disease (HCC)    Chronic kidney disease    stage3 kidney disease    Complication of anesthesia    slow to wake up   Diastolic dysfunction    Grade I, per echo in 2012   GERD (gastroesophageal reflux disease)    prevacid   H/O hiatal hernia    Headache(784.0)    sinus   High risk medication use    Flecainide therapy   History of heartburn    Hyperlipidemia    Hypertension    LVH (left ventricular hypertrophy)    Per echo in July 2012   Normal nuclear stress test 08/07/2010   Obesity    PAF (paroxysmal atrial fibrillation) (HCC)     Pneumonia    20 yrs ago   Syncope 08/07/2010    Medications:  Scheduled:   flecainide  50 mg Oral Daily   metoprolol tartrate  25 mg Oral BID   Infusions:   ampicillin-sulbactam (UNASYN) IV 3 g (05/15/23 0003)   heparin 850 Units/hr (05/14/23 1803)    Assessment: 89 yo admitted with acute pancreatitis on apixaban prior to admission for Afib. Per MD, to change to IV heparin. Last apixaban dose was 4/2 morning.  Today, 05/15/2023 - Heparin level 0.47, therapeutic on heparin 850 units/hr - CBC:  Hgb 11.2, Plt WNL  - No bleeding or infusion related issues reported by RN  Goal of Therapy:  Heparin level 0.3-0.7 Monitor platelets by anticoagulation protocol: Yes   Plan:  - Continue heparin drip at 850 units/hr - Daily aPTT, HL  - Daily CBC  Junita Push, PharmD, BCPS 05/15/2023@2 :59 AM

## 2023-05-15 NOTE — Progress Notes (Signed)
 PROGRESS NOTE    Cheryl [name]  ZOX:[id number] DOB: Oct 15, 1934 DOA: 05/09/2023 PCP: Laurann Montana, MD    Brief Narrative:  Cheryl [name] is a 89 y.o. female with history of paroxysmal atrial fibrillation, hypertension, chronic kidney disease stage III, hyperlipidemia presents to the ER with complaints of abdominal pain.  Patient's pain started last evening around 4 PM.  Had multiple episodes of vomiting.  Pain is mostly in the epigastric area nonradiating.  Patient initially tried some Gas-X with no relief decided to come to the ER.  MRCP not able to be done due to implanted device.  GI and GS consulted for recommendations.  Going for cholecystectomy on 4/7.  Recovery complicated by orthostatic hypotension.   Assessment and Plan: Acute pancreatitis with markedly elevated LFTs  - suspect gallstone pancreatitis.  Mildly dilated common bile duct.  No definite gallstones seen on CAT scan or ultrasound.   - Dr. Dulce Sellar gastroenterology has been consulted.   -unable to have MRI due to spinal cord stimulator- note from 10/19: MRI talked with [facility] regarding spinal stimulator and was advised that the wires and generator is not safe to undergo a MRI.  -GS: cholecystectomy + IOC 4/7  + blood culture/bacteremia- e coli IV abx  - Status post GB removal on 4/7  Orthostatic hypotension - Added TED hose - IV fluids - Check orthostatics in the a.m.   Paroxysmal atrial fibrillation  -takes Tambocor and metoprolol.  - Hold Eliquis:  last dose was 4/2 AM -on IV heparin   Prior history of CVA  - Resume Eliquis - Resume statin upon discharge   Hypertension  -hold amlodipine.   -Continue metoprolol   Chronic kidney disease stage III  -stable, follow labs    DVT prophylaxis: Place TED hose Start: 05/15/23 1132 apixaban (ELIQUIS) tablet 5 mg    Code Status: Full Code  Anticipate d.c on 4/9 when son is able to stay with patient Ambulate after surgery   Disposition Plan:  Level  of care: Med-Surg Status is: Inpatient     Consultants:  GS GI    Subjective: Son will be able to stay with her on 4/9 Has a little bit of a headache this morning and some dizziness-nurses did orthostatics and they were positive   Objective: Vitals:   05/14/23 2251 05/15/23 0234 05/15/23 0650 05/15/23 1017  BP: (!) 154/72 (!) 157/71 (!) 166/65 (!) 164/88  Pulse: 95 75 81 92  Resp: 14 15 16    Temp: 98.1 F (36.7 C) 98.5 F (36.9 C) 98 F (36.7 C)   TempSrc:   Oral   SpO2: 98% 96% 97%   Weight:      Height:        Intake/Output Summary (Last 24 hours) at 05/15/2023 1132 Last data filed at 05/15/2023 1014 Gross per 24 hour  Intake 1426.73 ml  Output 10 ml  Net 1416.73 ml   Filed Weights   05/11/23 0559 05/14/23 1051  Weight: 62.1 kg 62.1 kg    Examination:    General: Appearance:    Well developed, well nourished female in no acute distress     Lungs:     respirations unlabored  Heart:    Normal heart rate.   MS:   All extremities are intact.   Neurologic:   Awake, alert      Data Reviewed: I have personally reviewed following labs and imaging studies  CBC: Recent Labs  Lab 05/10/23 0115 05/10/23 8119 05/11/23 0514 05/12/23 1478  05/13/23 0056 05/14/23 0445 05/15/23 0216  WBC 19.0* 20.0* 20.6* 16.3* 12.6* 10.8* 12.0*  NEUTROABS 17.6* 18.3*  --   --   --   --   --   HGB 12.2 12.6 12.0 11.7* 10.9* 12.0 11.2*  HCT 35.1* 36.6 34.9* 35.1* 31.2* 34.8* 32.0*  MCV 94.1 96.3 95.9 97.0 95.7 94.6 92.2  PLT 275 273 260 240 213 288 294   Basic Metabolic Panel: Recent Labs  Lab 05/10/23 0713 05/11/23 0514 05/12/23 0524 05/13/23 0056 05/14/23 0445  NA 134* 129* 130* 131* 135  K 3.5 3.4* 3.4* 3.5 3.3*  CL 99 95* 98 102 102  CO2 25 21* 20* 17* 19*  GLUCOSE 127* 103* 89 90 77  BUN 19 16 9 9 9   CREATININE 1.20* 0.85 0.83 0.75 0.85  CALCIUM 9.4 9.0 8.8* 8.2* 8.8*   GFR: Estimated Creatinine Clearance: 39.3 mL/min (by C-G formula based on SCr of 0.85  mg/dL). Liver Function Tests: Recent Labs  Lab 05/10/23 0713 05/11/23 0514 05/12/23 0524 05/13/23 0056 05/14/23 0445  AST 1,150* 407* 165* 73* 48*  ALT 505* 310* 207* 136* 110*  ALKPHOS 190* 151* 136* 117 123  BILITOT 1.7* 1.0 1.0 0.9 1.0  PROT 5.9* 6.4* 6.4* 5.5* 6.5  ALBUMIN 3.1* 3.3* 3.1* 2.7* 3.2*   Recent Labs  Lab 05/10/23 0115 05/11/23 0514 05/12/23 0524  LIPASE 1,066* 729* 192*   No results for input(s): "AMMONIA" in the last 168 hours. Coagulation Profile: No results for input(s): "INR", "PROTIME" in the last 168 hours. Cardiac Enzymes: No results for input(s): "CKTOTAL", "CKMB", "CKMBINDEX", "TROPONINI" in the last 168 hours. BNP (last 3 results) No results for input(s): "PROBNP" in the last 8760 hours. HbA1C: No results for input(s): "HGBA1C" in the last 72 hours. CBG: Recent Labs  Lab 05/13/23 0546 05/13/23 1159 05/14/23 0004 05/14/23 0605 05/14/23 0639  GLUCAP 72 74 85 69* 128*   Lipid Profile: No results for input(s): "CHOL", "HDL", "LDLCALC", "TRIG", "CHOLHDL", "LDLDIRECT" in the last 72 hours.  Thyroid Function Tests: No results for input(s): "TSH", "T4TOTAL", "FREET4", "T3FREE", "THYROIDAB" in the last 72 hours. Anemia Panel: No results for input(s): "VITAMINB12", "FOLATE", "FERRITIN", "TIBC", "IRON", "RETICCTPCT" in the last 72 hours. Sepsis Labs: Recent Labs  Lab 05/10/23 0115 05/10/23 0340 05/10/23 0713 05/10/23 0906  LATICACIDVEN 1.5 2.2* 1.6 1.3    Recent Results (from the past 240 hours)  Blood culture (routine x 2)     Status: Abnormal   Collection Time: 05/10/23  3:40 AM   Specimen: BLOOD  Result Value Ref Range Status   Specimen Description   Final    BLOOD LEFT ANTECUBITAL Performed at [facility], [address] [place], [place] [address]    Special Requests   Final    BOTTLES DRAWN AEROBIC AND ANAEROBIC Blood Culture adequate volume Performed at [facility], [address], [place], [place] [address]    Culture  Setup Time   Final    GRAM NEGATIVE RODS ANAEROBIC BOTTLE ONLY CRITICAL RESULT CALLED TO, READ BACK BY AND VERIFIED WITH: PHARMD Alwyn Ren [id number] @1957  FH Performed at [facility], [address], [place], [place] [address]    Culture ESCHERICHIA COLI (A)  Final   Report Status 05/13/2023 FINAL  Final   Organism ID, Bacteria ESCHERICHIA COLI  Final   Organism ID, Bacteria ESCHERICHIA COLI  Final      Susceptibility   Escherichia coli - KIRBY BAUER*    CEFAZOLIN  SENSITIVE Sensitive    Escherichia coli - MIC*    AMPICILLIN <=2 SENSITIVE Sensitive     CEFTAZIDIME <=1 SENSITIVE Sensitive     CEFTRIAXONE <=0.25 SENSITIVE Sensitive     CIPROFLOXACIN <=0.25 SENSITIVE Sensitive     GENTAMICIN <=1 SENSITIVE Sensitive     IMIPENEM <=0.25 SENSITIVE Sensitive     TRIMETH/SULFA <=20 SENSITIVE Sensitive     AMPICILLIN/SULBACTAM <=2 SENSITIVE Sensitive     * ESCHERICHIA COLI    ESCHERICHIA COLI  Blood Culture ID Panel (Reflexed)     Status: Abnormal   Collection Time: 05/10/23  3:40 AM  Result Value Ref Range Status   Enterococcus faecalis NOT DETECTED NOT DETECTED Final   Enterococcus Faecium NOT DETECTED NOT DETECTED Final   Listeria monocytogenes NOT DETECTED NOT DETECTED Final   Staphylococcus species NOT DETECTED NOT DETECTED Final   Staphylococcus aureus (BCID) NOT DETECTED NOT DETECTED Final   Staphylococcus epidermidis NOT DETECTED NOT DETECTED Final   Staphylococcus lugdunensis NOT DETECTED NOT DETECTED Final   Streptococcus species NOT DETECTED NOT DETECTED Final   Streptococcus agalactiae NOT DETECTED NOT DETECTED Final   Streptococcus pneumoniae NOT DETECTED NOT DETECTED Final   Streptococcus pyogenes NOT DETECTED NOT DETECTED Final   A.calcoaceticus-baumannii NOT DETECTED NOT DETECTED Final   Bacteroides fragilis NOT DETECTED NOT DETECTED Final   Enterobacterales DETECTED (A) NOT DETECTED Final    Comment: Enterobacterales  represent a large order of gram negative bacteria, not a single organism. CRITICAL RESULT CALLED TO, READ BACK BY AND VERIFIED WITH: PHARMD A. Weston Anna [phone] @1955  FH    Enterobacter cloacae complex NOT DETECTED NOT DETECTED Final   Escherichia coli DETECTED (A) NOT DETECTED Final    Comment: CRITICAL RESULT CALLED TO, READ BACK BY AND VERIFIED WITH: PHARMD AWeston Anna [id number] @1955  FH    Klebsiella aerogenes NOT DETECTED NOT DETECTED Final   Klebsiella oxytoca NOT DETECTED NOT DETECTED Final   Klebsiella pneumoniae NOT DETECTED NOT DETECTED Final   Proteus species NOT DETECTED NOT DETECTED Final   Salmonella species NOT DETECTED NOT DETECTED Final   Serratia marcescens NOT DETECTED NOT DETECTED Final   Haemophilus influenzae NOT DETECTED NOT DETECTED Final   Neisseria meningitidis NOT DETECTED NOT DETECTED Final   Pseudomonas aeruginosa NOT DETECTED NOT DETECTED Final   Stenotrophomonas maltophilia NOT DETECTED NOT DETECTED Final   Candida albicans NOT DETECTED NOT DETECTED Final   Candida auris NOT DETECTED NOT DETECTED Final   Candida glabrata NOT DETECTED NOT DETECTED Final   Candida krusei NOT DETECTED NOT DETECTED Final   Candida parapsilosis NOT DETECTED NOT DETECTED Final   Candida tropicalis NOT DETECTED NOT DETECTED Final   Cryptococcus neoformans/gattii NOT DETECTED NOT DETECTED Final   CTX-M ESBL NOT DETECTED NOT DETECTED Final   Carbapenem resistance IMP NOT DETECTED NOT DETECTED Final   Carbapenem resistance KPC NOT DETECTED NOT DETECTED Final   Carbapenem resistance NDM NOT DETECTED NOT DETECTED Final   Carbapenem resist OXA 48 LIKE NOT DETECTED NOT DETECTED Final   Carbapenem resistance VIM NOT DETECTED NOT DETECTED Final    Comment: Performed at [facility], [address], [place], [place] [address]  Blood culture (routine x 2)     Status: Abnormal   Collection Time: 05/10/23  3:45 AM   Specimen: BLOOD  Result Value Ref Range Status   Specimen  Description   Final    BLOOD RIGHT ANTECUBITAL Performed at [facility], [address], [place], [place] [address]  Special Requests   Final    BOTTLES DRAWN AEROBIC AND ANAEROBIC Blood Culture adequate volume Performed at [facility], [address] [place], [place] [address]    Culture  Setup Time   Final    GRAM NEGATIVE RODS BOTTLES DRAWN AEROBIC ONLY CRITICAL VALUE NOTED.  VALUE IS CONSISTENT WITH PREVIOUSLY REPORTED AND CALLED VALUE.    Culture (A)  Final    ESCHERICHIA COLI SUSCEPTIBILITIES PERFORMED ON PREVIOUS CULTURE WITHIN THE LAST 5 DAYS. Performed at [facility], [address], [place], [place] [address]    Report Status 05/13/2023 FINAL  Final         Radiology Studies: No results found.       Scheduled Meds:  acetaminophen  1,000 mg Oral Q6H   apixaban  5 mg Oral BID   flecainide  50 mg Oral Daily   metoprolol tartrate  25 mg Oral BID   Continuous Infusions:  sodium chloride     ampicillin-sulbactam (UNASYN) IV Stopped (05/15/23 0653)     LOS: 5 days    Time spent: 45 minutes spent on chart review, discussion with nursing staff, consultants, updating family and interview/physical exam; more than 50% of that time was spent in counseling and/or coordination of care.    Joseph Art, DO [facility] Available via Epic secure chat 7am-7pm After these hours, please refer to coverage provider listed on [email or web address] 05/15/2023, 11:32 AM

## 2023-05-15 NOTE — Progress Notes (Signed)
 PHARMACY - ANTICOAGULATION CONSULT NOTE  Pharmacy Consult for apixaban Indication: atrial fibrillation  Allergies  Allergen Reactions   Cortisone Nausea And Vomiting and Other (See Comments)    "Made her deathly ill- had to be hospitalized due to sodium being so low from vomiting."   Codeine Phosphate Other (See Comments)    Headaches   Lipitor [Atorvastatin Calcium] Other (See Comments)    Muscle pain and tiredness   Sulfa Antibiotics Other (See Comments)    Headaches     Patient Measurements: Height: 5\' 3"  (160 cm) Weight: 62.1 kg (136 lb 14.5 oz) IBW/kg (Calculated) : 52.4 HEPARIN DW (KG): 62.1 Wt 66kg from Nov 2024  Vital Signs: Temp: 98 F (36.7 C) (04/08 0650) Temp Source: Oral (04/08 0650) BP: 166/65 (04/08 0650) Pulse Rate: 81 (04/08 0650)  Labs: Recent Labs    05/12/23 1448 05/13/23 0056 05/13/23 0056 05/14/23 0445 05/15/23 0216  HGB  --  10.9*   < > 12.0 11.2*  HCT  --  31.2*  --  34.8* 32.0*  PLT  --  213  --  288 294  APTT 68* 90*  --  87*  --   HEPARINUNFRC  --  0.88*  --  0.64 0.47  CREATININE  --  0.75  --  0.85  --    < > = values in this interval not displayed.    Estimated Creatinine Clearance: 39.3 mL/min (by C-G formula based on SCr of 0.85 mg/dL).   Medical History: Past Medical History:  Diagnosis Date   Arthritis    CAD (coronary artery disease)    Mild per remote cath in 2004; normal nuclear in 2012   Carotid artery disease (HCC)    Chronic kidney disease    stage3 kidney disease    Complication of anesthesia    slow to wake up   Diastolic dysfunction    Grade I, per echo in 2012   GERD (gastroesophageal reflux disease)    prevacid   H/O hiatal hernia    Headache(784.0)    sinus   High risk medication use    Flecainide therapy   History of heartburn    Hyperlipidemia    Hypertension    LVH (left ventricular hypertrophy)    Per echo in July 2012   Normal nuclear stress test 08/07/2010   Obesity    PAF (paroxysmal  atrial fibrillation) (HCC)    Pneumonia    20 yrs ago   Syncope 08/07/2010    Medications:  Scheduled:   flecainide  50 mg Oral Daily   metoprolol tartrate  25 mg Oral BID   Infusions:   ampicillin-sulbactam (UNASYN) IV 3 g (05/15/23 0622)   heparin 850 Units/hr (05/15/23 0400)    Assessment: 89 yo admitted with acute pancreatitis on apixaban prior to admission for Afib. Pharmacy was consulted to change to IV heparin prior to surgery.  S/p Lap chole on 4/7 and heparin was resumed postop.  Pharmacy is now consulted to resume apixaban dosing.   Goal of Therapy:  Monitor platelets by anticoagulation protocol: Yes   Plan:  - D/C heparin drip - Resume apixaban 5mg  PO BID - Pharmacy will sign off notes, but will peripherally follow up dosing, CBC.    Lynann Beaver PharmD, BCPS [facility] main pharmacy [phone] 05/15/2023 10:00 AM

## 2023-05-15 NOTE — Discharge Instructions (Signed)

## 2023-05-15 NOTE — Progress Notes (Signed)
 Physical Therapy Re-Evaluation Patient Details Name: Cheryl [name] MRN: [id number] DOB: 05-04-1934 Today's Date: 05/15/2023   History of Present Illness Cheryl [name] is a 89 y.o. female admitted with acute pancreatitis. PMH: afib, htn, chronic kidney disease stage III, hyperlipidemia    PT Comments  Re-eval. Pt is now s/p lap cholecystectomy 05/14/23. Pt agreeable to working with therapy but she reports feeling weak and dizzy. Did not observe any nystagmus during session. See flowsheets for BP readings--? orthostatic? Pt did not feel she could tolerate getting to chair or ambulating this am. She reports she is planning to d/c home tomorrow but currently unsure if she will be functionally ready to do so. Will continue to follow and progress activity as pt is able to tolerate. Continue to recommend HHPT but per chart review pt/family declining f/u on last PT visit.    If plan is discharge home, recommend the following: A little help with walking and/or transfers;A little help with bathing/dressing/bathroom;Assistance with cooking/housework;Assist for transportation;Help with stairs or ramp for entrance   Can travel by private vehicle        Equipment Recommendations  None recommended by PT    Recommendations for Other Services OT consult     Precautions / Restrictions Precautions Precautions: Fall Precaution/Restrictions Comments: dizziness? BP? Restrictions Weight Bearing Restrictions Per Provider Order: No     Mobility  Bed Mobility Overal bed mobility: Needs Assistance Bed Mobility: Supine to Sit, Sit to Supine Min Assist           General bed mobility comments: HOB elevated. Partial roll to R side. Pt able to use bedrail and sit upright at EOB. Assist for LEs. Increased time. Pt c/o dizziness. Did not see any nystagmus    Transfers Overall transfer level: Needs assistance Equipment used: Rolling walker (2 wheels) Transfers: Sit to/from Stand Sit to Stand: Contact guard  assist           General transfer comment: Cues for safety. Increased time. Pt continued to c/o dizziness. Also c/o weakness. Had to sit before dynamap finished.    Ambulation/Gait Ambulation/Gait assistance: Min assist   Assistive device: Rolling walker (2 wheels)         General Gait Details: Took a few side steps with RW vs support of bedrail at [facility]. Pt did not feel she could ambulate due to dizziness, weakness.   Stairs             Wheelchair Mobility     Tilt Bed    Modified Rankin (Stroke Patients Only)       Balance Overall balance assessment: Needs assistance         Standing balance support: Reliant on assistive device for balance, During functional activity, Bilateral upper extremity supported Standing balance-Leahy Scale: Poor                              Communication Communication Communication: Impaired Factors Affecting Communication: Hearing impaired  Cognition Arousal: Alert Behavior During Therapy: WFL for tasks assessed/performed   PT - Cognitive impairments: No apparent impairments                         Following commands: Intact      Cueing    Exercises      General Comments        Pertinent Vitals/Pain Pain Assessment Pain Assessment: Faces Faces Pain Scale: Hurts even more Pain  Location: abdomen, L knee Pain Descriptors / Indicators: Grimacing, Operative site guarding Pain Intervention(s): Limited activity within patient's tolerance, Monitored during session, Repositioned    Home Living                          Prior Function            PT Goals (current goals can now be found in the care plan section) Progress towards PT goals: Progressing toward goals    Frequency    Min 2X/week      PT Plan      Co-evaluation              AM-PAC PT "6 Clicks" Mobility   Outcome Measure  Help needed turning from your back to your side while in a flat bed without using  bedrails?: A Little Help needed moving from lying on your back to sitting on the side of a flat bed without using bedrails?: A Little Help needed moving to and from a bed to a chair (including a wheelchair)?: A Little Help needed standing up from a chair using your arms (e.g., wheelchair or bedside chair)?: A Little Help needed to walk in hospital room?: A Lot Help needed climbing 3-5 steps with a railing? : A Lot 6 Click Score: 16    End of Session   Activity Tolerance: Patient limited by fatigue;Patient limited by pain (limited by dizziness) Patient left: in chair;with call bell/phone within reach;with chair alarm set   PT Visit Diagnosis: Unsteadiness on feet (R26.81);Muscle weakness (generalized) (M62.81)     Time: 1914-7829 PT Time Calculation (min) (ACUTE ONLY): 16 min  Charges:      PT General Charges $$ ACUTE PT VISIT: 1 Visit                        Cheryl [name], PT Acute Rehabilitation  Office: [phone]

## 2023-05-15 NOTE — Progress Notes (Signed)
 1 Day Post-Op  Subjective: Sore today, but pain well controlled currently.  Eating some FLs well with no issues.  Voiding.  ROS: See above, otherwise other systems negative  Objective: Vital signs in last 24 hours: Temp:  [97.4 F (36.3 C)-98.5 F (36.9 C)] 98 F (36.7 C) (04/08 0650) Pulse Rate:  [69-95] 92 (04/08 1017) Resp:  [13-21] 16 (04/08 0650) BP: (151-172)/(65-88) 164/88 (04/08 1017) SpO2:  [96 %-100 %] 97 % (04/08 0650) Weight:  [62.1 kg] 62.1 kg (04/07 1051) Last BM Date : 05/12/23  Intake/Output from previous day: 04/07 0701 - 04/08 0700 In: 1334.1 [P.O.:360; I.V.:745.4; IV Piggyback:228.8] Out: 10 [Blood:10] Intake/Output this shift: Total I/O In: 152.7 [I.V.:52.7; IV Piggyback:100] Out: -   PE: Abd: soft, appropriately tender, incisions c/d/I, ND, +BS  Lab Results:  Recent Labs    05/14/23 0445 05/15/23 0216  WBC 10.8* 12.0*  HGB 12.0 11.2*  HCT 34.8* 32.0*  PLT 288 294   BMET Recent Labs    05/13/23 0056 05/14/23 0445  NA 131* 135  K 3.5 3.3*  CL 102 102  CO2 17* 19*  GLUCOSE 90 77  BUN 9 9  CREATININE 0.75 0.85  CALCIUM 8.2* 8.8*   PT/INR No results for input(s): "LABPROT", "INR" in the last 72 hours. CMP     Component Value Date/Time   NA 135 05/14/2023 0445   NA 135 10/02/2022 1625   K 3.3 (L) 05/14/2023 0445   CL 102 05/14/2023 0445   CO2 19 (L) 05/14/2023 0445   GLUCOSE 77 05/14/2023 0445   BUN 9 05/14/2023 0445   BUN 20 10/02/2022 1625   CREATININE 0.85 05/14/2023 0445   CALCIUM 8.8 (L) 05/14/2023 0445   PROT 6.5 05/14/2023 0445   ALBUMIN 3.2 (L) 05/14/2023 0445   AST 48 (H) 05/14/2023 0445   ALT 110 (H) 05/14/2023 0445   ALKPHOS 123 05/14/2023 0445   BILITOT 1.0 05/14/2023 0445   GFRNONAA >60 05/14/2023 0445   GFRAA 41 (L) 11/14/2017 0451   Lipase     Component Value Date/Time   LIPASE 192 (H) 05/12/2023 0524       Studies/Results: No results found.  Anti-infectives: Anti-infectives (From  admission, onward)    Start     Dose/Rate Route Frequency Ordered Stop   05/14/23 1100  Ampicillin-Sulbactam (UNASYN) 3 g in sodium chloride 0.9 % 100 mL IVPB        3 g 200 mL/hr over 30 Minutes Intravenous Every 6 hours 05/14/23 0940     05/10/23 2200  cefTRIAXone (ROCEPHIN) 2 g in sodium chloride 0.9 % 100 mL IVPB  Status:  Discontinued        2 g 200 mL/hr over 30 Minutes Intravenous Every 24 hours 05/10/23 2035 05/14/23 0940   05/10/23 0245  piperacillin-tazobactam (ZOSYN) IVPB 3.375 g        3.375 g 100 mL/hr over 30 Minutes Intravenous  Once 05/10/23 0240 05/10/23 0436        Assessment/Plan POD 1, s/p lap chole by Dr. Derrell Lolling 4/7 for gallstone pancreatitis -regular diet -mobilize -may resume Eliquis -surgically stable for DC home, but primary plans to keep her til her son comes into town tomorrow to help her out postoperatively which I think is very reasonable given her age/comorbidities. -her follow up as well as script has been sent to her pharmacy -we will be available as needed.  FEN - regular VTE - Eliquis ID - unasyn per primary for e coli bacteremia  LOS: 5 days    Letha Cape , [facility] 05/15/2023, 10:21 AM Please see Amion for pager number during day hours 7:00am-4:30pm or 7:00am -11:30am on weekends

## 2023-05-15 NOTE — Progress Notes (Signed)
 Mobility Specialist - Progress Note   05/15/23 1512  Mobility  Activity Ambulated with assistance in hallway  Level of Assistance Standby assist, set-up cues, supervision of patient - no hands on  Assistive Device Front wheel walker  Distance Ambulated (ft) 80 ft  Activity Response Tolerated well  Mobility Referral Yes  Mobility visit 1 Mobility  Mobility Specialist Start Time (ACUTE ONLY) 1458  Mobility Specialist Stop Time (ACUTE ONLY) 1512  Mobility Specialist Time Calculation (min) (ACUTE ONLY) 14 min   Pt received in bed and agreeable to mobility. C/o feeling weak with session. Pt to bed after session with all needs met.    [facility]

## 2023-05-16 DIAGNOSIS — K851 Biliary acute pancreatitis without necrosis or infection: Secondary | ICD-10-CM | POA: Diagnosis not present

## 2023-05-16 LAB — COMPREHENSIVE METABOLIC PANEL WITH GFR
ALT: 67 U/L — ABNORMAL HIGH (ref 0–44)
AST: 39 U/L (ref 15–41)
Albumin: 2.8 g/dL — ABNORMAL LOW (ref 3.5–5.0)
Alkaline Phosphatase: 96 U/L (ref 38–126)
Anion gap: 12 (ref 5–15)
BUN: 10 mg/dL (ref 8–23)
CO2: 20 mmol/L — ABNORMAL LOW (ref 22–32)
Calcium: 8 mg/dL — ABNORMAL LOW (ref 8.9–10.3)
Chloride: 101 mmol/L (ref 98–111)
Creatinine, Ser: 0.76 mg/dL (ref 0.44–1.00)
GFR, Estimated: >60 mL/min (ref >60)
Glucose, Bld: 103 mg/dL — ABNORMAL HIGH (ref 70–99)
Potassium: 3.2 mmol/L — ABNORMAL LOW (ref 3.5–5.1)
Sodium: 133 mmol/L — ABNORMAL LOW (ref 135–145)
Total Bilirubin: 0.7 mg/dL (ref 0.0–1.2)
Total Protein: 5.8 g/dL — ABNORMAL LOW (ref 6.5–8.1)

## 2023-05-16 LAB — CBC
HCT: 34.1 % — ABNORMAL LOW (ref 36.0–46.0)
Hemoglobin: 11.6 g/dL — ABNORMAL LOW (ref 12.0–15.0)
MCH: 32 pg (ref 26.0–34.0)
MCHC: 34 g/dL (ref 30.0–36.0)
MCV: 94.2 fL (ref 80.0–100.0)
Platelets: 332 10*3/uL (ref 150–400)
RBC: 3.62 MIL/uL — ABNORMAL LOW (ref 3.87–5.11)
RDW: 13.4 % (ref 11.5–15.5)
WBC: 9.7 10*3/uL (ref 4.0–10.5)
nRBC: 0 % (ref 0.0–0.2)

## 2023-05-16 MED ORDER — SALINE SPRAY 0.65 % NA SOLN
1.0000 | NASAL | Status: DC | PRN
  Administered 2023-05-17: 1 via NASAL
  Filled 2023-05-16: qty 44

## 2023-05-16 MED ORDER — SODIUM CHLORIDE 0.9 % IV BOLUS
500.0000 mL | Freq: Once | INTRAVENOUS | Status: AC
  Administered 2023-05-16: 500 mL via INTRAVENOUS

## 2023-05-16 MED ORDER — LORATADINE 10 MG PO TABS
10.0000 mg | ORAL_TABLET | Freq: Every day | ORAL | Status: DC
  Administered 2023-05-16 – 2023-05-17 (×2): 10 mg via ORAL
  Filled 2023-05-16 (×2): qty 1

## 2023-05-16 NOTE — Progress Notes (Signed)
 Physical Therapy Treatment Patient Details Name: Cheryl [name] MRN: [id number] DOB: Oct 26, 1934 Today's Date: 05/16/2023   History of Present Illness ROSSY VIRAG is a 89 y.o. female admitted with acute pancreatitis. PMH: afib, htn, chronic kidney disease stage III, hyperlipidemia    PT Comments  Pt agreeable to working with therapy. Donned compression stockings prior to mobility. She reports feeling "weak" and "flushed". She was able to ambulate on today with use of rollator. Encouraged pt to sit up in recliner and to drink fluids. Continue to recommend HHPT. Recommend supervision and assist at home for safety.    If plan is discharge home, recommend the following: A little help with walking and/or transfers;A little help with bathing/dressing/bathroom;Assistance with cooking/housework;Assist for transportation;Help with stairs or ramp for entrance   Can travel by private vehicle        Equipment Recommendations  None recommended by PT    Recommendations for Other Services       Precautions / Restrictions Precautions Precautions: Fall Precaution/Restrictions Comments: dizziness Restrictions Weight Bearing Restrictions Per Provider Order: No     Mobility  Bed Mobility Overal bed mobility: Needs Assistance Bed Mobility: Supine to Sit     Supine to sit: HOB elevated, Used rails, Modified independent (Device/Increase time)     General bed mobility comments: Donned compression stockings prior to mobility. Increased time.    Transfers Overall transfer level: Needs assistance Equipment used: Rollator (4 wheels) Transfers: Sit to/from Stand Sit to Stand: Supervision           General transfer comment: Cues for safety. Increased time. Pt reports feeling weak and "flushed"    Ambulation/Gait Ambulation/Gait assistance: Contact guard assist Gait Distance (Feet): 60 Feet Assistive device: Rollator (4 wheels) Gait Pattern/deviations: Step-through pattern, Decreased stride  length       General Gait Details: Slow gait. Fatigues fairly easily. Followed with recliner but did not need to use it. Pt reports feeling weak and "flushed". BP154/80.   Stairs             Wheelchair Mobility     Tilt Bed    Modified Rankin (Stroke Patients Only)       Balance Overall balance assessment: Needs assistance         Standing balance support: Reliant on assistive device for balance, During functional activity, Bilateral upper extremity supported Standing balance-Leahy Scale: Fair                              Hotel manager: Impaired Factors Affecting Communication: Hearing impaired  Cognition Arousal: Alert Behavior During Therapy: WFL for tasks assessed/performed   PT - Cognitive impairments: No apparent impairments                         Following commands: Intact      Cueing    Exercises      General Comments        Pertinent Vitals/Pain Pain Assessment Pain Assessment: Faces Faces Pain Scale: Hurts little more Pain Location: abdomen, L knee Pain Intervention(s): Limited activity within patient's tolerance, Monitored during session, Repositioned    Home Living                          Prior Function            PT Goals (current goals can now be found in the care  plan section) Progress towards PT goals: Progressing toward goals    Frequency    Min 2X/week      PT Plan      Co-evaluation              AM-PAC PT "6 Clicks" Mobility   Outcome Measure  Help needed turning from your back to your side while in a flat bed without using bedrails?: None Help needed moving from lying on your back to sitting on the side of a flat bed without using bedrails?: None Help needed moving to and from a bed to a chair (including a wheelchair)?: A Little Help needed standing up from a chair using your arms (e.g., wheelchair or bedside chair)?: A Little Help needed  to walk in hospital room?: A Little Help needed climbing 3-5 steps with a railing? : A Little 6 Click Score: 20    End of Session Equipment Utilized During Treatment: Gait belt Activity Tolerance: Patient limited by fatigue Patient left: in chair;with call bell/phone within reach;with chair alarm set   PT Visit Diagnosis: Unsteadiness on feet (R26.81);Muscle weakness (generalized) (M62.81)     Time: 1000-1026 PT Time Calculation (min) (ACUTE ONLY): 26 min  Charges:    $Gait Training: 23-37 mins PT General Charges $$ ACUTE PT VISIT: 1 Visit                         Faye Ramsay, PT Acute Rehabilitation  Office: [phone]

## 2023-05-16 NOTE — Progress Notes (Signed)
 [facility] Progress Note  Patient: Cheryl [name]     RJJ:[id number]  DOA: 05/09/2023   PCP: Laurann Montana, MD       Brief hospital course: 89 year old female with paroxysmal atrial fibrillation, chronic kidney disease and hypertension who presents to the hospital with abdominal pain and vomiting.  The patient was found to have acute pancreatitis with elevated LFTs and felt to have passed a gallstone resulting in gallstone pancreatitis.  GI and general surgery were consulted.  She underwent a cholecystectomy on 4/7.  Blood cultures noted to be positive for E. coli.  Hospital course complicated by orthostatic hypotension.  Subjective:  The patient felt lightheaded when asked to stand and ambulate today.  She admits to having a black bowel movement this morning and some nausea.  Assessment and Plan: Principal Problem:   Acute pancreatitis, elevated LFTs -Suspected to be secondary to passing out gallstone-has resolved - Status post laparoscopic cholecystectomy on 4/7-surgical pathology reveals chronic cholecystitis  Active Problems: Orthostatic hypotension -Blood pressure dropping from 160/80 to 100/74 with standing-heart rate increases from 90-113 - Will give a 500 cc bolus and follow orthostatics  E. coli bacteremia - Blood culture obtained on/3/25 positive for pansensitive E. coli-the patient is currently receiving Unasyn and will transition to Augmentin for total of a 7-day course    PAF (paroxysmal atrial fibrillation)  - Continue flecainide and apixaban  Black stool - Due to the fact that she is on a apixaban, I would like to ensure that this resolves and her hemoglobin is stable prior to discharge  Hypokalemia Continue to replace    S/P insertion of spinal cord stimulator   History of CVA (cerebrovascular accident)       Code Status: Full Code Total time on patient care: 35 minutes DVT prophylaxis: Eliquis  Objective:   Vitals:   05/15/23 2028 05/16/23  0520 05/16/23 0901 05/16/23 1419  BP: (!) 161/81 (!) 151/79 (!) 156/80 (!) 158/100  Pulse: 91 87 90 83  Resp: 16 19  16   Temp: (!) 97.5 F (36.4 C) 98.3 F (36.8 C)  98.1 F (36.7 C)  TempSrc: Oral Oral  Oral  SpO2: 96% 97%  99%  Weight:      Height:       Filed Weights   05/11/23 0559 05/14/23 1051  Weight: 62.1 kg 62.1 kg   Exam: General exam: Appears comfortable  HEENT: oral mucosa moist Respiratory system: Clear to auscultation.  Cardiovascular system: S1 & S2 heard  Gastrointestinal system: Abdomen soft, non-tender, nondistended. Normal bowel sounds   Extremities: No cyanosis, clubbing or edema Psychiatry:  Mood & affect appropriate.      CBC: Recent Labs  Lab 05/10/23 0115 05/10/23 0713 05/11/23 0514 05/12/23 0524 05/13/23 0056 05/14/23 0445 05/15/23 0216 05/16/23 0454  WBC 19.0* 20.0*   < > 16.3* 12.6* 10.8* 12.0* 9.7  NEUTROABS 17.6* 18.3*  --   --   --   --   --   --   HGB 12.2 12.6   < > 11.7* 10.9* 12.0 11.2* 11.6*  HCT 35.1* 36.6   < > 35.1* 31.2* 34.8* 32.0* 34.1*  MCV 94.1 96.3   < > 97.0 95.7 94.6 92.2 94.2  PLT 275 273   < > 240 213 288 294 332   < > = values in this interval not displayed.   Basic Metabolic Panel: Recent Labs  Lab 05/11/23 0514 05/12/23 0524 05/13/23 0056 05/14/23 0445 05/16/23 0454  NA 129* 130* 131* 135  133*  K 3.4* 3.4* 3.5 3.3* 3.2*  CL 95* 98 102 102 101  CO2 21* 20* 17* 19* 20*  GLUCOSE 103* 89 90 77 103*  BUN 16 9 9 9 10   CREATININE 0.85 0.83 0.75 0.85 0.76  CALCIUM 9.0 8.8* 8.2* 8.8* 8.0*     Scheduled Meds:  acetaminophen  1,000 mg Oral Q6H   apixaban  5 mg Oral BID   flecainide  50 mg Oral Daily   metoprolol tartrate  25 mg Oral BID    Imaging and lab data personally reviewed   Author: Calvert Cantor  05/16/2023 3:01 PM  To contact [facility]>   Check the care team in [facility] and look for the attending/consulting [facility] provider listed  Log into [email or web address] and use [facility]'s universal  password   Go to> "[facility]"  and find provider  If you still have difficulty reaching the provider, please page the [facility] (Director on Call) for the Hospitalists listed on amion

## 2023-05-17 DIAGNOSIS — I951 Orthostatic hypotension: Secondary | ICD-10-CM | POA: Insufficient documentation

## 2023-05-17 DIAGNOSIS — K851 Biliary acute pancreatitis without necrosis or infection: Secondary | ICD-10-CM | POA: Insufficient documentation

## 2023-05-17 LAB — CBC
HCT: 32.7 % — ABNORMAL LOW (ref 36.0–46.0)
Hemoglobin: 11.2 g/dL — ABNORMAL LOW (ref 12.0–15.0)
MCH: 32.4 pg (ref 26.0–34.0)
MCHC: 34.3 g/dL (ref 30.0–36.0)
MCV: 94.5 fL (ref 80.0–100.0)
Platelets: 360 10*3/uL (ref 150–400)
RBC: 3.46 MIL/uL — ABNORMAL LOW (ref 3.87–5.11)
RDW: 13.7 % (ref 11.5–15.5)
WBC: 9.8 10*3/uL (ref 4.0–10.5)
nRBC: 0 % (ref 0.0–0.2)

## 2023-05-17 MED ORDER — POTASSIUM CHLORIDE CRYS ER 20 MEQ PO TBCR
40.0000 meq | EXTENDED_RELEASE_TABLET | Freq: Once | ORAL | Status: AC
  Administered 2023-05-17: 40 meq via ORAL
  Filled 2023-05-17: qty 2

## 2023-05-17 MED ORDER — AMOXICILLIN-POT CLAVULANATE 875-125 MG PO TABS
1.0000 | ORAL_TABLET | Freq: Two times a day (BID) | ORAL | 0 refills | Status: AC
Start: 2023-05-17 — End: 2023-05-20

## 2023-05-17 NOTE — Progress Notes (Signed)
Discharge instructions given to patient questions ask and answered.  D Pasqual Farias RN 

## 2023-05-17 NOTE — Discharge Summary (Addendum)
 Physician Discharge Summary  Cheryl [name] ZOX:[id number] DOB: [birth year] DOA: 05/09/2023  PCP: Victorio Grave, MD  Admit date: 05/09/2023 Discharge date: 05/17/2023 Discharging to: home Recommendations for Outpatient Follow-up:  Please f/u on K+ as outpt  Consults:  GI General surgery Procedures:  Lap chole   Discharge Diagnoses:   Principal Problem:   Acute gallstone pancreatitis Active Problems:   E coli bacteremia   Essential hypertension   PAF (paroxysmal atrial fibrillation) (HCC)   CKD (chronic kidney disease) stage 2   S/P insertion of spinal cord stimulator   History of CVA (cerebrovascular accident)   Orthostatic hypotension      Brief hospital course: 89 year old female with paroxysmal atrial fibrillation, chronic kidney disease and hypertension who presents to the hospital with abdominal pain and vomiting.  The patient was found to have acute pancreatitis with elevated LFTs and felt to have passed a gallstone resulting in gallstone pancreatitis.  GI and general surgery were consulted.  She underwent a cholecystectomy on 4/7.  Blood cultures noted to be positive for E. coli.  Hospital course complicated by orthostatic hypotension.   Subjective:  The patient felt lightheaded when asked to stand and ambulate today.  She admits to having a black bowel movement this morning and some nausea.   Assessment and Plan: Principal Problem:   Acute pancreatitis, elevated LFTs -Suspected to be secondary to passing out gallstone-has resolved - Status post laparoscopic cholecystectomy on 4/7-surgical pathology reveals chronic cholecystitis - tolerating diet and stable to dc   Active Problems: Orthostatic hypotension -Blood pressure dropping from 160/80 to 100/74 with standing-heart rate increases from 90-113 - given another 500 cc bolus on 4/9 - able to ambulate w/o dizziness afterward   E. coli bacteremia - Blood culture obtained on/3/25 positive for pansensitive E. coli-the  patient is currently receiving Unasyn  and will transition to Augmentin  today     PAF (paroxysmal atrial fibrillation)  Hypokalemia - Continue flecainide  and apixaban  - replace K   Black stool - apparently she had an episode on 4/9 - has not recurred- Hgb has been stable    S/P insertion of spinal cord stimulator   History of CVA (cerebrovascular accident)   CKD stage 2 - GFR > 60         Discharge Instructions  Discharge Instructions     Diet - low sodium heart healthy   Complete by: As directed    Increase activity slowly   Complete by: As directed       Allergies as of 05/17/2023       Reactions   Cortisone Nausea And Vomiting, Other (See Comments)   "Made her deathly ill- had to be hospitalized due to sodium being so low from vomiting."   Codeine Phosphate Other (See Comments)   Headaches   Lipitor [atorvastatin  Calcium ] Other (See Comments)   Muscle pain and tiredness   Sulfa Antibiotics Other (See Comments)   Headaches        Medication List     STOP taking these medications    HYDROcodone -acetaminophen  5-325 MG tablet Commonly known as: NORCO/VICODIN       TAKE these medications    acetaminophen  500 MG tablet Commonly known as: TYLENOL  Take 2 tablets (1,000 mg total) by mouth every 6 (six) hours as needed.   alendronate 70 MG tablet Commonly known as: FOSAMAX Take 70 mg by mouth every Tuesday.   amLODipine  5 MG tablet Commonly known as: NORVASC  Take 5 mg by mouth at bedtime.   amoxicillin -clavulanate  875-125 MG tablet Commonly known as: AUGMENTIN  Take 1 tablet by mouth 2 (two) times daily for 3 days.   Biotin 10 MG Caps Take 10 mg by mouth daily.   cyclobenzaprine  5 MG tablet Commonly known as: FLEXERIL  Take 1 tablet (5 mg total) by mouth 3 (three) times daily as needed for muscle spasms.   Eliquis  5 MG Tabs tablet Generic drug: apixaban  TAKE 1 TABLET BY MOUTH TWICE DAILY   famotidine 20 MG tablet Commonly known as:  PEPCID Take 20 mg by mouth 2 (two) times daily.   flecainide  50 MG tablet Commonly known as: TAMBOCOR  Take 1 tablet (50 mg total) by mouth daily. What changed: when to take this   loratadine  10 MG tablet Commonly known as: CLARITIN  Take 10 mg by mouth daily as needed for allergies.   metoprolol  tartrate 25 MG tablet Commonly known as: LOPRESSOR  Take 1 tablet (25 mg total) by mouth 2 (two) times daily.   rosuvastatin  5 MG tablet Commonly known as: CRESTOR  Take 5 mg by mouth See admin instructions. Take 5 mg by mouth at bedtime on Mondays and Thursdays   traMADol  50 MG tablet Commonly known as: ULTRAM  Take 1 tablet (50 mg total) by mouth every 6 (six) hours as needed for moderate pain (pain score 4-6).   traZODone  100 MG tablet Commonly known as: DESYREL  Take 1 tablet (100 mg total) by mouth at bedtime as needed for sleep. What changed: when to take this        Follow-up Information     [facility] Follow up.   Why: [facility] physical therapy Contact information: [address] [place] [address] [phone]         Hillary Lowing, PA-C Follow up on 06/05/2023.   Specialty: General Surgery Why: 9:00am, Arrive 30 minutes prior to your appointment time, Please bring your insurance card and photo ID Contact information: [address] [facility] [place] [address] [phone]                    The results of significant diagnostics from this hospitalization (including imaging, microbiology, ancillary and laboratory) are listed below for reference.    CT ABDOMEN PELVIS W CONTRAST Result Date: 05/10/2023 CLINICAL DATA:  89 year old female with abdominal pain. EXAM: CT ABDOMEN AND PELVIS WITH CONTRAST TECHNIQUE: Multidetector CT imaging of the abdomen and pelvis was performed using the standard protocol following bolus administration of intravenous contrast. RADIATION DOSE REDUCTION: This  exam was performed according to the departmental dose-optimization program which includes automated exposure control, adjustment of the mA and/or kV according to patient size and/or use of iterative reconstruction technique. CONTRAST:  75mL OMNIPAQUE  IOHEXOL  300 MG/ML  SOLN COMPARISON:  Right upper quadrant ultrasound earlier today. FINDINGS: Lower chest: Borderline to mild cardiomegaly. No pericardial or pleural effusion. Plate-like, streaky bilateral lung base atelectasis. Hepatobiliary: Mild hepatic periportal edema. Liver enhancement otherwise within normal limits. Inflammation, edema at the porta hepatis. CBD mildly dilated up to 9 mm diameter (coronal image 46). No CBD filling defect. Gallbladder remains within normal limits by CT. Pancreas: Confluent inflammation and edema throughout the lesser sac with indistinct appearance of the pancreas, especially the head and uncinate. Streak artifact from lumbar spine hardware and spinal stimulator device also limit some detail of the pancreas. No main ductal dilatation is identified. No obvious mass or necrosis. Associated mildly fluid distended proximal stomach and proximal duodenum. The distal stomach and distal  duodenum are both decompressed. Secondary inflammation on both structures. Small volume of free fluid emanating from the lesser sac, no drainable or organized fluid collection is identified. Spleen: Trace perisplenic free fluid. Splenic enhancement remains normal. Adrenals/Urinary Tract: Normal adrenal glands. Nonobstructed kidneys with symmetric renal enhancement and contrast excretion 2 diminutive ureters. Stomach/Bowel: Nondilated large and small bowel throughout the abdomen and pelvis. Intermittent large bowel diverticulosis. Appendix diminutive or absent. No pneumoperitoneum. Vascular/Lymphatic: Aortoiliac calcified atherosclerosis. Major arterial structures remain patent. Normal caliber abdominal aorta. Portal venous system including the splenic vein  is patent. No lymphadenopathy identified. Reproductive: Within normal limits. Other: No pelvis free fluid. Musculoskeletal: Advanced spine degeneration superimposed on 3 level lumbar decompression and fusion. And thoracic spinal stimulator partially visible, right flank generator device. No acute osseous abnormality identified. IMPRESSION: 1. Acute Pancreatitis. Some pancreatic detail limited from artifact due to lumbar spine hardware and spinal stimulator device. No obvious pancreatic mass or necrosis. 2. Confluent inflammation, edema emanating from the lesser sac. Secondary inflammation of the stomach and duodenum. Small volume of free fluid in the abdomen but no organized or drainable fluid collection identified. 3. Inflammation at the porta hepatis with mildly dilated CBD (9 mm), and mild periportal edema. 4. Mild lung base atelectasis. Aortic Atherosclerosis (ICD10-I70.0). Electronically Signed   By: Marlise Simpers M.D.   On: 05/10/2023 04:19   US  Abdomen Limited RUQ (LIVER/GB) Result Date: 05/10/2023 CLINICAL DATA:  Acute right upper quadrant pain. EXAM: ULTRASOUND ABDOMEN LIMITED RIGHT UPPER QUADRANT COMPARISON:  CTA chest including upper abdomen 12/02/2021. FINDINGS: Gallbladder: No gallstones or wall thickening visualized. No sonographic Murphy sign noted by sonographer. There is a small amount of hypoechoic layering sludge within the lumen. Common bile duct: Diameter: 6.3 mm.  No intrahepatic bile duct dilatation. Liver: No focal lesion identified. Within normal limits in parenchymal echogenicity. Portal vein is patent on color Doppler imaging with normal direction of blood flow towards the liver. Other: None. IMPRESSION: 1. Small amount of gallbladder sludge. No gallstones or wall thickening. 2. Common bile duct 6.3 mm. Correlate with LFTs. No intrahepatic bile duct dilatation. Electronically Signed   By: Denman Fischer M.D.   On: 05/10/2023 03:51   Labs:   Basic Metabolic Panel: Recent Labs  Lab  05/11/23 0514 05/12/23 0524 05/13/23 0056 05/14/23 0445 05/16/23 0454  NA 129* 130* 131* 135 133*  K 3.4* 3.4* 3.5 3.3* 3.2*  CL 95* 98 102 102 101  CO2 21* 20* 17* 19* 20*  GLUCOSE 103* 89 90 77 103*  BUN 16 9 9 9 10   CREATININE 0.85 0.83 0.75 0.85 0.76  CALCIUM  9.0 8.8* 8.2* 8.8* 8.0*     CBC: Recent Labs  Lab 05/13/23 0056 05/14/23 0445 05/15/23 0216 05/16/23 0454 05/17/23 0501  WBC 12.6* 10.8* 12.0* 9.7 9.8  HGB 10.9* 12.0 11.2* 11.6* 11.2*  HCT 31.2* 34.8* 32.0* 34.1* 32.7*  MCV 95.7 94.6 92.2 94.2 94.5  PLT 213 288 294 332 360         SIGNED:   Sedalia Dacosta, MD  [facility] 05/17/2023, 11:35 AM Time taking on discharge: 50 minutes

## 2023-05-17 NOTE — Care Management Important Message (Signed)
 Important Message  Patient Details IM Letter given to the Patient. Name: Cheryl [name] MRN: [id number] Date of Birth: 1934-05-24   Important Message Given:  Yes - Medicare IM     Caren Macadam 05/17/2023, 9:20 AM

## 2023-06-04 DIAGNOSIS — N182 Chronic kidney disease, stage 2 (mild): Secondary | ICD-10-CM | POA: Insufficient documentation

## 2023-06-05 DIAGNOSIS — Z131 Encounter for screening for diabetes mellitus: Secondary | ICD-10-CM | POA: Diagnosis not present

## 2023-06-05 DIAGNOSIS — E559 Vitamin D deficiency, unspecified: Secondary | ICD-10-CM | POA: Diagnosis not present

## 2023-06-05 DIAGNOSIS — N1831 Chronic kidney disease, stage 3a: Secondary | ICD-10-CM | POA: Diagnosis not present

## 2023-06-05 DIAGNOSIS — M48061 Spinal stenosis, lumbar region without neurogenic claudication: Secondary | ICD-10-CM | POA: Diagnosis not present

## 2023-06-05 DIAGNOSIS — Z9181 History of falling: Secondary | ICD-10-CM | POA: Diagnosis not present

## 2023-06-05 DIAGNOSIS — M129 Arthropathy, unspecified: Secondary | ICD-10-CM | POA: Diagnosis not present

## 2023-06-05 DIAGNOSIS — Z9049 Acquired absence of other specified parts of digestive tract: Secondary | ICD-10-CM | POA: Diagnosis not present

## 2023-06-05 DIAGNOSIS — Z79899 Other long term (current) drug therapy: Secondary | ICD-10-CM | POA: Diagnosis not present

## 2023-06-05 DIAGNOSIS — Z0000 Encounter for general adult medical examination without abnormal findings: Secondary | ICD-10-CM | POA: Diagnosis not present

## 2023-06-05 DIAGNOSIS — K219 Gastro-esophageal reflux disease without esophagitis: Secondary | ICD-10-CM | POA: Diagnosis not present

## 2023-06-05 DIAGNOSIS — K5900 Constipation, unspecified: Secondary | ICD-10-CM | POA: Diagnosis not present

## 2023-06-07 DIAGNOSIS — Z79899 Other long term (current) drug therapy: Secondary | ICD-10-CM | POA: Diagnosis not present

## 2023-06-11 DIAGNOSIS — E876 Hypokalemia: Secondary | ICD-10-CM | POA: Diagnosis not present

## 2023-06-11 DIAGNOSIS — D72829 Elevated white blood cell count, unspecified: Secondary | ICD-10-CM | POA: Diagnosis not present

## 2023-06-11 DIAGNOSIS — Z09 Encounter for follow-up examination after completed treatment for conditions other than malignant neoplasm: Secondary | ICD-10-CM | POA: Diagnosis not present

## 2023-06-11 DIAGNOSIS — Z9049 Acquired absence of other specified parts of digestive tract: Secondary | ICD-10-CM | POA: Diagnosis not present

## 2023-06-11 DIAGNOSIS — K859 Acute pancreatitis without necrosis or infection, unspecified: Secondary | ICD-10-CM | POA: Diagnosis not present

## 2023-06-11 DIAGNOSIS — E871 Hypo-osmolality and hyponatremia: Secondary | ICD-10-CM | POA: Diagnosis not present

## 2023-06-28 DIAGNOSIS — K5900 Constipation, unspecified: Secondary | ICD-10-CM | POA: Diagnosis not present

## 2023-07-13 DIAGNOSIS — Z9049 Acquired absence of other specified parts of digestive tract: Secondary | ICD-10-CM | POA: Diagnosis not present

## 2023-07-13 DIAGNOSIS — K5900 Constipation, unspecified: Secondary | ICD-10-CM | POA: Diagnosis not present

## 2023-07-16 ENCOUNTER — Other Ambulatory Visit: Payer: Self-pay | Admitting: Physician Assistant

## 2023-07-20 DIAGNOSIS — K5900 Constipation, unspecified: Secondary | ICD-10-CM | POA: Diagnosis not present

## 2023-08-03 DIAGNOSIS — K5900 Constipation, unspecified: Secondary | ICD-10-CM | POA: Diagnosis not present

## 2023-08-03 DIAGNOSIS — Z9049 Acquired absence of other specified parts of digestive tract: Secondary | ICD-10-CM | POA: Diagnosis not present

## 2023-09-02 ENCOUNTER — Other Ambulatory Visit: Payer: Self-pay | Admitting: Cardiology

## 2023-09-02 DIAGNOSIS — I480 Paroxysmal atrial fibrillation: Secondary | ICD-10-CM

## 2023-09-03 DIAGNOSIS — K5900 Constipation, unspecified: Secondary | ICD-10-CM | POA: Diagnosis not present

## 2023-09-03 NOTE — Telephone Encounter (Signed)
 Prescription refill request for Eliquis  received. Indication:afib Last office visit:11/24 Scr:0.76  4/25 Age: 89 Weight:62.1  kg  Prescription refilled

## 2023-09-12 DIAGNOSIS — K219 Gastro-esophageal reflux disease without esophagitis: Secondary | ICD-10-CM | POA: Diagnosis not present

## 2023-09-12 DIAGNOSIS — Z79899 Other long term (current) drug therapy: Secondary | ICD-10-CM | POA: Diagnosis not present

## 2023-09-12 DIAGNOSIS — G8929 Other chronic pain: Secondary | ICD-10-CM | POA: Diagnosis not present

## 2023-09-12 DIAGNOSIS — Z9181 History of falling: Secondary | ICD-10-CM | POA: Diagnosis not present

## 2023-09-12 DIAGNOSIS — N1831 Chronic kidney disease, stage 3a: Secondary | ICD-10-CM | POA: Diagnosis not present

## 2023-09-12 DIAGNOSIS — Z6823 Body mass index (BMI) 23.0-23.9, adult: Secondary | ICD-10-CM | POA: Diagnosis not present

## 2023-09-12 DIAGNOSIS — M25561 Pain in right knee: Secondary | ICD-10-CM | POA: Diagnosis not present

## 2023-09-12 DIAGNOSIS — M25562 Pain in left knee: Secondary | ICD-10-CM | POA: Diagnosis not present

## 2023-09-12 DIAGNOSIS — M48061 Spinal stenosis, lumbar region without neurogenic claudication: Secondary | ICD-10-CM | POA: Diagnosis not present

## 2023-09-14 DIAGNOSIS — Z79899 Other long term (current) drug therapy: Secondary | ICD-10-CM | POA: Diagnosis not present

## 2023-11-26 ENCOUNTER — Other Ambulatory Visit: Payer: Self-pay | Admitting: Cardiology

## 2023-12-31 ENCOUNTER — Other Ambulatory Visit: Payer: Self-pay | Admitting: Physician Assistant

## 2024-02-23 ENCOUNTER — Other Ambulatory Visit: Payer: Self-pay | Admitting: Cardiology

## 2024-02-25 ENCOUNTER — Other Ambulatory Visit: Payer: Self-pay | Admitting: Physician Assistant

## 2024-03-04 ENCOUNTER — Encounter (HOSPITAL_BASED_OUTPATIENT_CLINIC_OR_DEPARTMENT_OTHER): Admitting: General Surgery

## 2024-03-11 ENCOUNTER — Telehealth: Payer: Self-pay | Admitting: Cardiology

## 2024-03-12 MED ORDER — FLECAINIDE ACETATE 50 MG PO TABS: 50.0000 mg | ORAL_TABLET | Freq: Every day | ORAL | 0 refills | Status: AC

## 2024-03-12 NOTE — Telephone Encounter (Signed)
 Pt scheduled 05/05/24, refill sent.

## 2024-03-25 ENCOUNTER — Encounter (HOSPITAL_BASED_OUTPATIENT_CLINIC_OR_DEPARTMENT_OTHER): Admitting: General Surgery

## 2024-05-05 ENCOUNTER — Ambulatory Visit: Admitting: Cardiology
